# Patient Record
Sex: Female | Born: 1945 | Race: White | Hispanic: No | Marital: Married | Smoking: Former smoker
Health system: Southern US, Community
[De-identification: ages and names within clinical notes are randomized; demographics above are authoritative.]

## PROBLEM LIST (undated history)

## (undated) DIAGNOSIS — J029 Acute pharyngitis, unspecified: Secondary | ICD-10-CM

## (undated) DIAGNOSIS — Z87898 Personal history of other specified conditions: Secondary | ICD-10-CM

## (undated) DIAGNOSIS — Z8619 Personal history of other infectious and parasitic diseases: Secondary | ICD-10-CM

## (undated) DIAGNOSIS — I341 Nonrheumatic mitral (valve) prolapse: Secondary | ICD-10-CM

## (undated) DIAGNOSIS — E785 Hyperlipidemia, unspecified: Secondary | ICD-10-CM

## (undated) DIAGNOSIS — R55 Syncope and collapse: Secondary | ICD-10-CM

## (undated) DIAGNOSIS — K130 Diseases of lips: Secondary | ICD-10-CM

## (undated) DIAGNOSIS — R002 Palpitations: Secondary | ICD-10-CM

## (undated) DIAGNOSIS — M792 Neuralgia and neuritis, unspecified: Secondary | ICD-10-CM

## (undated) DIAGNOSIS — R5382 Chronic fatigue, unspecified: Secondary | ICD-10-CM

## (undated) DIAGNOSIS — Z8739 Personal history of other diseases of the musculoskeletal system and connective tissue: Secondary | ICD-10-CM

## (undated) DIAGNOSIS — F13930 Sedative, hypnotic or anxiolytic use, unspecified with withdrawal, uncomplicated: Secondary | ICD-10-CM

## (undated) DIAGNOSIS — F445 Conversion disorder with seizures or convulsions: Secondary | ICD-10-CM

## (undated) DIAGNOSIS — D6800 Von Willebrand disease, unspecified: Secondary | ICD-10-CM

## (undated) DIAGNOSIS — E559 Vitamin D deficiency, unspecified: Secondary | ICD-10-CM

## (undated) DIAGNOSIS — D509 Iron deficiency anemia, unspecified: Secondary | ICD-10-CM

## (undated) DIAGNOSIS — J309 Allergic rhinitis, unspecified: Secondary | ICD-10-CM

## (undated) DIAGNOSIS — M5412 Radiculopathy, cervical region: Secondary | ICD-10-CM

## (undated) DIAGNOSIS — G2581 Restless legs syndrome: Secondary | ICD-10-CM

## (undated) HISTORY — DX: Personal history of other specified conditions: Z87.898

## (undated) HISTORY — DX: Personal history of other diseases of the musculoskeletal system and connective tissue: Z87.39

## (undated) HISTORY — DX: Von Willebrand's disease: D68.0

## (undated) HISTORY — DX: Acute pharyngitis, unspecified: J02.9

## (undated) HISTORY — DX: Palpitations: R00.2

## (undated) HISTORY — DX: Chronic fatigue, unspecified: R53.82

## (undated) HISTORY — DX: Von Willebrand disease, unspecified: D68.00

## (undated) HISTORY — DX: Restless legs syndrome: G25.81

## (undated) HISTORY — DX: Sedative, hypnotic or anxiolytic use, unspecified with withdrawal, uncomplicated: F13.930

## (undated) HISTORY — DX: Vitamin D deficiency, unspecified: E55.9

## (undated) HISTORY — DX: Conversion disorder with seizures or convulsions: F44.5

## (undated) HISTORY — DX: Hyperlipidemia, unspecified: E78.5

## (undated) HISTORY — DX: Iron deficiency anemia, unspecified: D50.9

## (undated) HISTORY — DX: Diseases of lips: K13.0

## (undated) HISTORY — DX: Nonrheumatic mitral (valve) prolapse: I34.1

## (undated) HISTORY — DX: Syncope and collapse: R55

## (undated) HISTORY — DX: Personal history of other infectious and parasitic diseases: Z86.19

## (undated) HISTORY — PX: NO PAST SURGERIES: SHX2092

## (undated) HISTORY — DX: Allergic rhinitis, unspecified: J30.9

## (undated) HISTORY — DX: Neuralgia and neuritis, unspecified: M79.2

## (undated) HISTORY — DX: Radiculopathy, cervical region: M54.12
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
## 1997-08-18 ENCOUNTER — Other Ambulatory Visit: Admission: RE | Admit: 1997-08-18 | Discharge: 1997-08-18 | Payer: Self-pay | Admitting: Obstetrics and Gynecology

## 1998-01-19 ENCOUNTER — Other Ambulatory Visit: Admission: RE | Admit: 1998-01-19 | Discharge: 1998-01-19 | Payer: Self-pay | Admitting: Obstetrics and Gynecology

## 1998-02-26 ENCOUNTER — Other Ambulatory Visit: Admission: RE | Admit: 1998-02-26 | Discharge: 1998-02-26 | Payer: Self-pay | Admitting: Obstetrics and Gynecology

## 1998-04-14 ENCOUNTER — Other Ambulatory Visit: Admission: RE | Admit: 1998-04-14 | Discharge: 1998-04-14 | Payer: Self-pay | Admitting: Obstetrics and Gynecology

## 1998-08-19 ENCOUNTER — Other Ambulatory Visit: Admission: RE | Admit: 1998-08-19 | Discharge: 1998-08-19 | Payer: Self-pay | Admitting: Obstetrics and Gynecology

## 1999-02-08 ENCOUNTER — Other Ambulatory Visit: Admission: RE | Admit: 1999-02-08 | Discharge: 1999-02-08 | Payer: Self-pay | Admitting: Obstetrics and Gynecology

## 1999-08-19 ENCOUNTER — Other Ambulatory Visit: Admission: RE | Admit: 1999-08-19 | Discharge: 1999-08-19 | Payer: Self-pay | Admitting: Obstetrics and Gynecology

## 1999-09-29 ENCOUNTER — Encounter (INDEPENDENT_AMBULATORY_CARE_PROVIDER_SITE_OTHER): Payer: Self-pay

## 1999-09-29 ENCOUNTER — Other Ambulatory Visit: Admission: RE | Admit: 1999-09-29 | Discharge: 1999-09-29 | Payer: Self-pay | Admitting: Obstetrics and Gynecology

## 2000-11-07 ENCOUNTER — Other Ambulatory Visit: Admission: RE | Admit: 2000-11-07 | Discharge: 2000-11-07 | Payer: Self-pay | Admitting: Obstetrics and Gynecology

## 2001-11-07 ENCOUNTER — Other Ambulatory Visit: Admission: RE | Admit: 2001-11-07 | Discharge: 2001-11-07 | Payer: Self-pay | Admitting: Obstetrics and Gynecology

## 2002-12-13 ENCOUNTER — Other Ambulatory Visit: Admission: RE | Admit: 2002-12-13 | Discharge: 2002-12-13 | Payer: Self-pay | Admitting: Obstetrics and Gynecology

## 2004-02-26 ENCOUNTER — Ambulatory Visit: Payer: Self-pay | Admitting: Internal Medicine

## 2004-08-23 ENCOUNTER — Other Ambulatory Visit: Admission: RE | Admit: 2004-08-23 | Discharge: 2004-08-23 | Payer: Self-pay | Admitting: Obstetrics and Gynecology

## 2007-04-25 ENCOUNTER — Other Ambulatory Visit: Admission: RE | Admit: 2007-04-25 | Discharge: 2007-04-25 | Payer: Self-pay | Admitting: Family Medicine

## 2008-06-23 ENCOUNTER — Other Ambulatory Visit: Admission: RE | Admit: 2008-06-23 | Discharge: 2008-06-23 | Payer: Self-pay | Admitting: Family Medicine

## 2009-10-04 ENCOUNTER — Encounter: Admission: RE | Admit: 2009-10-04 | Discharge: 2009-10-04 | Payer: Self-pay | Admitting: Family Medicine

## 2010-05-02 ENCOUNTER — Encounter: Payer: Self-pay | Admitting: Family Medicine

## 2015-04-28 DIAGNOSIS — Z87898 Personal history of other specified conditions: Secondary | ICD-10-CM | POA: Insufficient documentation

## 2015-04-28 DIAGNOSIS — I341 Nonrheumatic mitral (valve) prolapse: Secondary | ICD-10-CM | POA: Insufficient documentation

## 2015-04-28 DIAGNOSIS — R002 Palpitations: Secondary | ICD-10-CM | POA: Insufficient documentation

## 2015-04-28 DIAGNOSIS — R55 Syncope and collapse: Secondary | ICD-10-CM | POA: Insufficient documentation

## 2015-05-07 ENCOUNTER — Ambulatory Visit: Payer: Self-pay | Admitting: Internal Medicine

## 2015-06-01 ENCOUNTER — Encounter: Payer: Self-pay | Admitting: Neurology

## 2015-06-01 ENCOUNTER — Ambulatory Visit: Payer: BLUE CROSS/BLUE SHIELD | Admitting: Neurology

## 2015-06-01 ENCOUNTER — Other Ambulatory Visit: Payer: Self-pay

## 2015-06-01 ENCOUNTER — Ambulatory Visit (INDEPENDENT_AMBULATORY_CARE_PROVIDER_SITE_OTHER): Payer: BLUE CROSS/BLUE SHIELD | Admitting: Neurology

## 2015-06-01 VITALS — BP 120/72 | HR 76 | Ht 65.0 in | Wt 121.4 lb

## 2015-06-01 DIAGNOSIS — R4189 Other symptoms and signs involving cognitive functions and awareness: Secondary | ICD-10-CM

## 2015-06-01 DIAGNOSIS — G4700 Insomnia, unspecified: Secondary | ICD-10-CM | POA: Insufficient documentation

## 2015-06-01 DIAGNOSIS — M797 Fibromyalgia: Secondary | ICD-10-CM

## 2015-06-01 DIAGNOSIS — R404 Transient alteration of awareness: Secondary | ICD-10-CM

## 2015-06-01 DIAGNOSIS — R419 Unspecified symptoms and signs involving cognitive functions and awareness: Secondary | ICD-10-CM

## 2015-06-01 DIAGNOSIS — R55 Syncope and collapse: Secondary | ICD-10-CM | POA: Diagnosis not present

## 2015-06-01 HISTORY — DX: Insomnia, unspecified: G47.00

## 2015-06-01 HISTORY — DX: Fibromyalgia: M79.7

## 2015-06-01 NOTE — Patient Instructions (Signed)
As far as diagnostic testing: MRI brain, eeg, lab  I would like to see you back in 3 months, sooner if we need to. Please call us with any interim questions, concerns, problems, updates or refill requests.   Our phone number is [phone]. We also have an after hours call service for urgent matters and there is a physician on-call for urgent questions. For any emergencies you know to call 911 or go to the nearest emergency room

## 2015-06-01 NOTE — Progress Notes (Signed)
Dr.Ahern did order a BMP for lab work. Dr. Lucia Gaskins told nurse pt does not need labs. Lab was notified and patient. Order for labs will be cancel per Dr.Ahern request.

## 2015-06-01 NOTE — Progress Notes (Signed)
[facility]    Provider:  Dr Lucia Gaskins Referring Provider: Shirlean Mylar, MD Primary Care Physician:  Frederich Chick, MD  CC:  Syncopal episodes  HPI:  Dawn Simmons is a 70 y.o. female here as a referral from Dr. Hyman Hopes for syncopal episodes. PMHx Fibromyalgia, RLS, Insomnia, low back pain, Iron deficiency. She was evaluated by cardiology with no etiology found. Patient is here with husband and daughter. June 30th she had her first episode. She got dizzy, she laid on the floor and then went and threw up. She was sick in November and she was given a z-pack and she took that and then started having everal loss of consciousness. She would pass out in the bed, for seconds. She has had up to 5 or more over a 3-day period. The last time she was sitting at the kitchen table, the first week of November she found herself on the floor with mail in her hands. Dr. Hyman Hopes thought they were panic attacks, she was given valium. She got better on the valium. No more episodes un til January. The first week of January she was sick again, upper respiratory infection and she had She had multiple in January, she would have episodes of where she was dream like, colors and cartoons and never went out completely. 10 in January. Then in February 4th had 5 of them, 8th had 4, 9th 5, 7th last time. Mostly she feel like she is going to gray out, she isn't dizzy but she has a feeling of lightheadedness, things gray out, she doesn't feel right, she tries to lay down, no triggers but there is a lot of stress, not diaphoretic, she either passes out completely or has a dream state. She knew where she was, she knew what was happening but couldn't say anything. She has fallen and hit her head. When she comes to, she is drowsy for 30 seconds and then thinks normal. She is out for 15 seconds. Daughter times with a clock and it was 35 seconds. She is just limp. No abnormal movements. She has said "well I am back" when she comes out  of it. Eyes closed. Sometimes she is partially aware during her LOS and she has visual hallucinations, saw colorful circles like clowns and colors once. Not positional, no time of day, layting down, sitting, standing anytime. She was on Holter for 2 weeks which did not show anything even during an episode. No FHx seizures or personal history.No excessive sleepiness. She is tired a lot because she doesn't sleep well. No hypnagoic or hypnapompic episodes. They do not happen during times of emotion.   Reviewed notes, labs and imaging from outside physicians, which showed:  CMP was unremarkable on 04/20/2015 with a creatinine of 0.78. TSH 1.45.  Review of Systems: Patient complains of symptoms per HPI as well as the following symptoms: weight loss, fatigue, palpitations, feeling cold, ringing in ears, joint pain, cramps, aching muscles, itching, moles, runny nose, skin sensitivity, weakness, passing out, insomnia, sleepiness, anxiety, not enough sleep, decreased energy, racing thoughts. . Pertinent negatives per HPI. All others negative.   Social History   Social History  . Marital Status: Married    Spouse Name: tony  . Number of Children: 4  . Years of Education: 12   Occupational History  . Not on file.   Social History Main Topics  . Smoking status: Former Smoker    Quit date: 04/27/1972  . Smokeless tobacco: Not on file  . Alcohol  Use: 0.0 oz/week    0 Standard drinks or equivalent per week     Comment: 1 glass wine daily  . Drug Use: No  . Sexual Activity: Not on file   Other Topics Concern  . Not on file   Social History Narrative   Lives at home with husband   Caffeine use- tea, 1 cup/day    Family History  Problem Relation Age of Onset  . Heart attack Maternal Grandfather   . Heart attack Paternal Grandfather   . Heart attack Maternal Uncle   . Stroke Mother   . Emphysema Father   . Heart failure Maternal Grandmother   . Heart failure Paternal Grandmother   .  Seizures Neg Hx     Past Medical History  Diagnosis Date  . H/O fibromyalgia   . H/O insomnia   . Syncope   . Palpitations   . Mitral valve prolapse   . History of palpitations   . Von Willebrand disease [facility])     Past Surgical History  Procedure Laterality Date  . No past surgeries      Current Outpatient Prescriptions  Medication Sig Dispense Refill  . diazepam (VALIUM) 5 MG tablet TAKE 1/2 TO 1 TABLET AT BED TIME    . Fish Oil OIL Take 1,000 mg by mouth daily.    . Homeopathic Products (ARNICA) GEL Apply topically.    . Multiple Vitamin (MULTIVITAMIN) capsule Take 1 capsule by mouth daily.    . Probiotic Product (PROBIOTIC FORMULA PO) Take by mouth as directed.    Marland Kitchen VITAMIN K PO Take 100 mcg by mouth daily.     No current facility-administered medications for this visit.    Allergies as of 06/01/2015 - Review Complete 06/01/2015  Allergen Reaction Noted  . Doxycycline Swelling 04/28/2015  . Tetanus toxoids Other (See Comments) 04/28/2015  . Flexeril [cyclobenzaprine] Other (See Comments) 04/28/2015    Vitals: BP 120/72 mmHg  Pulse 76  Ht 5\' 5"  (1.651 m)  Wt 121 lb 6.4 oz (55.067 kg)  BMI 20.20 kg/m2 Last Weight:  Wt Readings from Last 1 Encounters:  06/01/15 121 lb 6.4 oz (55.067 kg)   Last Height:   Ht Readings from Last 1 Encounters:  06/01/15 5\' 5"  (1.651 m)   Physical exam: Exam: Gen: NAD, conversant, well nourised, well groomed                     CV: RRR, no MRG. No Carotid Bruits. No peripheral edema, warm, nontender Eyes: Conjunctivae clear without exudates or hemorrhage  Neuro: Detailed Neurologic Exam  Speech:    Speech is normal; fluent and spontaneous with normal comprehension.  Cognition:    The patient is oriented to person, place, and time;     recent and remote memory intact;     language fluent;     normal attention, concentration,     fund of knowledge Cranial Nerves:    The pupils are equal, round, and reactive to light. The  fundi are normal and spontaneous venous pulsations are present. Visual fields are full to finger confrontation. Extraocular movements are intact. Trigeminal sensation is intact and the muscles of mastication are normal. The face is symmetric. The palate elevates in the midline. Hearing intact. Voice is normal. Shoulder shrug is normal. The tongue has normal motion without fasciculations.   Coordination:    Normal finger to nose and heel to shin. Normal rapid alternating movements.   Gait:    Heel-toe  and tandem gait are normal.   Motor Observation:    No asymmetry, no atrophy, and no involuntary movements noted. Tone:    Normal muscle tone.    Posture:    Posture is normal. normal erect    Strength:    Strength is V/V in the upper and lower limbs.      Sensation: intact to LT     Reflex Exam:  DTR's:    Deep tendon reflexes in the upper and lower extremities are brisk bilaterally.   Toes:    The toes are downgoing bilaterally.   Clonus:    Clonus is absent.    Assessment/Plan:  This is a 70 year old female with multiple episodes of alteration of consciousness. Patient is sometimes aware during these episodes and is unable to answer, is not consistent with seizures however need a workup including MRI of the brain with and without contrast, EEG. Routine EEG is negative will order a 3 day ambulatory video EEG to try and catch some of these episodes. If patient's narcolepsy can experience REM intrusions while awake and patient does describe some hallucinations during these episodes however she has no other symptoms of narcolepsy and this is unlikely. It may be nonepileptic events or pseudoseizures.  Naomie Dean, MD  [facility] [address] [place], [place] [address]  Phone [phone] Fax [phone]

## 2015-06-02 ENCOUNTER — Telehealth: Payer: Self-pay | Admitting: Neurology

## 2015-06-02 NOTE — Telephone Encounter (Signed)
Pt was looking at her after visit summary and says she does not agree with the diagnosis of Acute Confusion. She says that if Dr. Lucia Gaskins feels that it is accurate to please call the pt she would like to discuss why. Pt feels "acute confusion" is not good description of how she feels when she has an episode. She is a little concerned about the wording and what it might mean. Pt needs reassurance. Please call and advise [phone]

## 2015-06-02 NOTE — Telephone Encounter (Signed)
Spoke to patient. Will change diagnosis.

## 2015-06-02 NOTE — Telephone Encounter (Signed)
Called pt back. Clarified what acute confusion meant. She states she does not experience confusion and is confused as to why this was used. I explained what acute/chronic meant. She thought acute confusion was something serious. She states she is aware of what is going on during episodes except for when she blacks out. I stated that when she blacks out, someone has to explain what happens when she wakes up. She understands but states she is still aware of what is going on. She thanked me for explaining what this means but wants to know Dr Trevor Mace thoughts on why this was put. I explained I was not in the office yesterday and I will speak to Dr Lucia Gaskins and call her back to clarify. She verbalized understanding.

## 2015-06-02 NOTE — Addendum Note (Signed)
Addended by: Hillis Range on: 06/02/2015 10:44 AM   Modules accepted: Medications

## 2015-06-03 ENCOUNTER — Other Ambulatory Visit: Payer: Self-pay | Admitting: *Deleted

## 2015-06-14 ENCOUNTER — Ambulatory Visit
Admission: RE | Admit: 2015-06-14 | Discharge: 2015-06-14 | Disposition: A | Discharge status: Home / Self Care | Payer: Self-pay | Source: Ambulatory Visit | Attending: Neurology | Admitting: Neurology

## 2015-06-14 DIAGNOSIS — R419 Unspecified symptoms and signs involving cognitive functions and awareness: Secondary | ICD-10-CM | POA: Diagnosis not present

## 2015-06-14 DIAGNOSIS — R55 Syncope and collapse: Secondary | ICD-10-CM | POA: Diagnosis not present

## 2015-06-14 MED ORDER — GADOBENATE DIMEGLUMINE 529 MG/ML IV SOLN
10.0000 mL | Freq: Once | INTRAVENOUS | Status: AC | PRN
  Administered 2015-06-14: 10 mL via INTRAVENOUS

## 2015-06-16 ENCOUNTER — Telehealth: Payer: Self-pay | Admitting: *Deleted

## 2015-06-16 NOTE — Telephone Encounter (Signed)
Called and spoke to pt about normal MRI brain for age per Dr Lucia GaskinsAhern. Verified EEG appt on 3/13. Explained difference between MRI and EEG.

## 2015-06-16 NOTE — Telephone Encounter (Signed)
-----   Message from Anson FretAntonia B Ahern, MD sent at 06/15/2015  5:11 PM EST ----- MRi of the brain normal for age

## 2015-06-22 ENCOUNTER — Ambulatory Visit (INDEPENDENT_AMBULATORY_CARE_PROVIDER_SITE_OTHER): Payer: BLUE CROSS/BLUE SHIELD | Admitting: Neurology

## 2015-06-22 DIAGNOSIS — R55 Syncope and collapse: Secondary | ICD-10-CM

## 2015-06-22 DIAGNOSIS — R419 Unspecified symptoms and signs involving cognitive functions and awareness: Secondary | ICD-10-CM

## 2015-06-24 ENCOUNTER — Telehealth: Payer: Self-pay | Admitting: Neurology

## 2015-06-24 NOTE — Telephone Encounter (Signed)
EEG did not show any epileptiform activity. Spoke to patient. Offered a 3-day eeg so we could monitor her over an extended period. Patient declined. She will call us if she has anymore episodes.

## 2015-06-24 NOTE — Telephone Encounter (Signed)
Called pt back. Advised EEG results not ready yet. I will forward to Dr Lucia GaskinsAhern and we will call her once results are ready. She verbalized understanding.

## 2015-06-24 NOTE — Telephone Encounter (Signed)
Pt called requesting EEG results. Pt is anxious, she was told on Monday she would hear regarding EEG on Tuesday or Wednesday. She is wanting to be called if it will be longer than today to know when she will be notified.

## 2015-06-24 NOTE — Procedures (Signed)
   HISTORY: 70 year old female with passing out episode of unknown etiology  TECHNIQUE:  16 channel EEG was performed based on standard 10-16 international system. There was no cardiac rhythm recorded.  Upon awakening, the posterior background activity was mildly dysarrhythmic, with mixed alpha and theta range activities, reactive to eye opening and closure.  There was no evidence of epileptiform discharge.  Photic stimulation was performed, which induced a symmetric photic driving.  Hyperventilation was performed, there was no abnormality elicit.  No sleep was achieved.  CONCLUSION: This is a slight abnormal EEG.  There is mild background slowing indicate mild bi-cerebral malfunction, common etiology is metabolic toxic disarrangement.

## 2015-07-06 ENCOUNTER — Encounter: Payer: Self-pay | Admitting: Internal Medicine

## 2015-07-06 ENCOUNTER — Ambulatory Visit (INDEPENDENT_AMBULATORY_CARE_PROVIDER_SITE_OTHER): Payer: BLUE CROSS/BLUE SHIELD | Admitting: Internal Medicine

## 2015-07-06 VITALS — BP 120/78 | HR 74 | Ht 65.0 in | Wt 120.6 lb

## 2015-07-06 DIAGNOSIS — R55 Syncope and collapse: Secondary | ICD-10-CM

## 2015-07-06 NOTE — Progress Notes (Signed)
ELECTROPHYSIOLOGY CONSULT NOTE  Patient ID: Dawn Simmons, MRN: [id number], DOB/AGE: [birth year] 69 y.o. Admit date: (Not on file) Date of Consult: 07/06/2015  Primary Physician: Frederich Chick, MD Primary Cardiologist: jg Consulting Physician JG Chief Complaint: syncope    HPI Dawn Simmons is a 70 y.o. female  Seen for spells.  The first spell apparently occurred June 2016. She walked out of the bathroom and felt dizzy and became presyncopal. She laid on the floor. She was nauseated vomiting and then felt weak.  Some months later, 11/16 she developed an illness characterized by "laryngitis". She felt sick. She could not clarify this but she was sick enough that she was in bed for days. She was achy "feeling crummy" may be that her myalgias. She was given a Z-Pak. She passed out a number of times she said in bed. She is sure that she did not fall asleep. These were happening clusters. On one occasion she got up and was sitting in a chair opening mail and she awakened on the floor.  She then felt better for a couple of weeks with recurrent symptoms about 2 months later she notes that the laryngitis symptoms never resolved. But she again felt ill as if she had the flu but "not really" she again took a Z-Pak. There again recurrent episodes of syncope.  She describes a syncopal episodes as occurring in any position. A last 20-30 seconds. Sometimes she can hear and sometimes she cannot. Sometimes she is able to sit down and abort the episodes.  At some point her physician felt she is having panic attacks and treated with benzodiazepines without significant improvement.  She then had a third cluster of episodes this time unassociated with azithromycin.  She was finally referred to Dawn Simmons. She has a high regard for him.  As part of his evaluation, she recalls that he did carotid sinus massage with some recurrence of symptoms.  Cardiac evaluation has included an echocardiogram 2/17 that  was normal. A treadmill test 2/17 was also normal. Carotid Dopplers were less than 50%. An event recorder demonstrated sinus rhythm during episode of syncope albeit with a rapid rate as above 121 bpm.  Other episodes were associated with sinus rates in the 70s. A video taken by her daughter while she is sitting upright demonstrate that her lids are closed and there is no change in skin color.  In this regard and deeply embedded to the careful note by Dr. Daisy Simmons. She describes a series of multiple episodes occurring in clusters. She is described as "just limp" her eyes are described as closed. "Sometimes she is partially aware during her LOS and she has visual hallucinations.     Past Medical History  Diagnosis Date  . H/O fibromyalgia   . H/O insomnia   . Syncope   . Palpitations   . Mitral valve prolapse   . History of palpitations   . Von Willebrand disease [facility])       Surgical History:  Past Surgical History  Procedure Laterality Date  . No past surgeries       Home Meds: Prior to Admission medications   Medication Sig Start Date End Date Taking? Authorizing Provider  Calcium-Magnesium 200-100 MG TABS Take 4 tablets by mouth daily. 2 tablets in the morning and 2 tablets in the evening   Yes Historical Provider, MD  co-enzyme Q-10 50 MG capsule Take 50 mg by mouth daily.   Yes Historical Provider, MD  diazepam (  VALIUM) 5 MG tablet Take 5 mg by mouth every 6 (six) hours as needed for anxiety or muscle spasms. TAKE 1/2 TO 1 TABLET AT BED TIME as needed   Yes Historical Provider, MD  Fish Oil OIL Take 2,000 mg by mouth daily.    Yes Historical Provider, MD  Homeopathic Products (ARNICA) GEL Apply topically as needed (as needed to affected areas).    Yes Historical Provider, MD  Multiple Vitamin (MULTIVITAMIN) capsule Take 1 capsule by mouth daily.   Yes Historical Provider, MD  Probiotic Product (PROBIOTIC FORMULA PO) Take 1 tablet by mouth daily.    Yes Historical Provider, MD    vitamin B-12 (CYANOCOBALAMIN) 1000 MCG tablet Take 1,000 mcg by mouth daily.   Yes Historical Provider, MD  VITAMIN K PO Take 90 mcg by mouth daily.    Yes Historical Provider, MD    Allergies:  Allergies  Allergen Reactions  . Doxycycline Swelling  . Tetanus Toxoids Other (See Comments)    PAIN IN NECK AND FACE  . Flexeril [Cyclobenzaprine] Other (See Comments)    Social History   Social History  . Marital Status: Married    Spouse Name: Dawn Simmons  . Number of Children: 4  . Years of Education: 12   Occupational History  . Not on file.   Social History Main Topics  . Smoking status: Former Smoker    Quit date: 04/27/1972  . Smokeless tobacco: Not on file  . Alcohol Use: 0.0 oz/week    0 Standard drinks or equivalent per week     Comment: 1 glass wine daily  . Drug Use: No  . Sexual Activity: Not on file   Other Topics Concern  . Not on file   Social History Narrative   Lives at home with husband   Caffeine use- tea, 1 cup/day     Family History  Problem Relation Age of Onset  . Heart attack Maternal Grandfather   . Heart attack Paternal Grandfather   . Heart attack Maternal Uncle   . Stroke Mother   . Emphysema Father   . Heart failure Maternal Grandmother   . Heart failure Paternal Grandmother   . Seizures Neg Hx      ROS:  Please see the history of present illness.     All other systems reviewed and negative.    Physical Exam:   Blood pressure 120/78, pulse 74, height 5\' 5"  (1.651 m), weight 120 lb 9.6 oz (54.704 kg). General: Well developed, well nourished female in no acute distress. Head: Normocephalic, atraumatic, sclera non-icteric, no xanthomas, nares are without discharge. EENT: normal  Lymph Nodes:  none Neck: Negative for carotid bruits. JVD not elevated. Back:without scoliosis kyphosis  . Msk:  Strength and tone appear normal for age. Extremities: No clubbing or cyanosis. No edema.  Distal pedal pulses are 2+ and equal bilaterally. Skin:  Warm and Dry Neuro: Alert and oriented X 3. CN III-XII intact Grossly normal sensory and motor function . Psych:  Responds to questions appropriately with a normal affect.      Labs: Cardiac Enzymes No results for input(s): CKTOTAL, CKMB, TROPONINI in the last 72 hours. CBC No results found for: WBC, HGB, HCT, MCV, PLT PROTIME: No results for input(s): LABPROT, INR in the last 72 hours. Chemistry No results for input(s): NA, K, CL, CO2, BUN, CREATININE, CALCIUM, PROT, BILITOT, ALKPHOS, ALT, AST, GLUCOSE in the last 168 hours.  Invalid input(s): LABALBU Lipids No results found for: CHOL, HDL, LDLCALC, TRIG  BNP No results found for: PROBNP Thyroid Function Tests: No results for input(s): TSH, T4TOTAL, T3FREE, THYROIDAB in the last 72 hours.  Invalid input(s): FREET3 Miscellaneous No results found for: DDIMER  Radiology/Studies:  Mr Lodema Pilot Contrast  06/15/2015   [facility] [address] [place], [place] [address] [phone] NEUROIMAGING REPORT STUDY DATE: 06/14/2015 PATIENT NAME: Emanuela Runnion DOB: 04/29/1945 MRN: [id number] EXAM: MRI Brain with and without contrast ORDERING CLINICIAN: Naomie Dean M.D. CLINICAL HISTORY: 70 year old woman with transient alteration of awareness and loss of consciousness. COMPARISON FILMS: None TECHNIQUE: MRI of the brain with and without contrast was obtained utilizing 5 mm axial slices with T1, T2, T2 flair, T2 star gradient echo and diffusion weighted views.  T1 sagittal, T2 coronal and postcontrast views in the axial and coronal plane were obtained. CONTRAST: 10 ml Multihance IMAGING SITE: [facility], [address] FINDINGS: On sagittal images, the spinal cord is imaged caudally to C3 and is normal in caliber.   The contents of the posterior fossa are of normal size and position.   The pituitary gland and optic chiasm appear normal.    There is minimal article atrophy, typical for age.   The ventricles are  normal in size for age and without distortion.  There are no abnormal extra-axial collections of fluid.  The cerebellum and brainstem appears normal.   The deep gray matter appears normal.  There are a few small scattered punctate T2/FLAIR hyperintense foci in the deep white matter of both hemispheres.  The orbits appear normal.   The VIIth/VIIIth nerve complex appears normal.  The mastoid air cells appear normal.  The paranasal sinuses appear normal.  Flow voids are identified within the major intracerebral arteries.   Diffusion weighted images are normal.  Gradient echo heme weighted images are normal.   After the infusion of contrast material, a normal enhancement pattern is noted.   06/15/2015   This MRI of the brain with and without contrast shows the following: 1.   A few scattered T2/FLAIR hyperintense foci in the hemispheres is consistent with a normal extent of age-related chronic microvascular ischemic change. 2.   Brain volume is normal for age. 3.   There are no acute findings. INTERPRETING PHYSICIAN: Richard A. Epimenio Foot, MD, PhD Certified in  Neuroimaging by American Society of Neuroimaging    EKG:  From Dr. Vernona Rieger office date not clear sinus rhythm at 81 Intervals 18/08/36   Assessment and Plan: * Spells  Laryngitis   The patient has unusual spells. Features of them suggest neuropsychiatric spells and these include the fact that her eyes are closed, the hallucinations that are described, the lack of pallor noted on her video recording, and the fact that they happen while she is supine and they happen in clusters. That having been said, uncommon manifestations of common things are more common but it's hard for me to understand how these could be cardiovascular in origin.  I am intrigued by the response to carotid sinus massage and some similarities to her symptoms. The monitor clearly excludes a cardioinhibitory component or a primary arrhythmic component. However, a basal depression component  is still possible. Given her complaints about her voice, I think it is important to exclude a structural abnormality of her neck where in both carotid sinus hypersensitivity and "disoriented" may be cooccuring. I do think is reasonable for ENT evaluation both for direct laryngoscopy as well as possibly imaging to exclude such a process.  I  have offered tilt table testing which if we were to do undertake with arterial line. For the reasons outlined above I'm not sanguine that it would be informative  The initial concern by the cooccuring recurrence with the Zithromax of QT prolongation and polymorphic ventricular tachycardia is excluded by the rhythm detections.    Sherryl MangesSteven Purvis Sidle

## 2015-07-07 ENCOUNTER — Ambulatory Visit: Payer: BLUE CROSS/BLUE SHIELD | Admitting: Neurology

## 2015-07-16 NOTE — Telephone Encounter (Addendum)
Patient called to request EEG results and MRI results and explanation as to what they found and what they didn't find to be mailed to her home address. Patient states she told Dr. Graciela HusbandsKlein that they didn't find anything on her MRI, states Dr. Graciela HusbandsKlein said "not exactly".

## 2015-07-16 NOTE — Telephone Encounter (Signed)
Dr Ahern- please advise, thank you 

## 2015-07-16 NOTE — Telephone Encounter (Signed)
I spoke to patient. She doesn't remember calling me today. I reviewed everything with her again today.  The MRI of the brain was normal for age. It showed white matter changes that were non-specific and within normal limits for aging. The EEG did not show any epileptiform activity. It showed slowing but this is nonspecific and can be caused by illness, medication effects, dementing process or other metabolic or toxic causes. She has been thoroughly evaluated by primary care and she reports nothing found.  I had offered patient a 3-day eeg aand she declined. I encouraged her again on the phone today to let us perform a 3-day home eeg and she has declined. She has declined a sleep evaluation as well. The daughter took a video of the most recent event, she had another event where she passed out for 25 seconds, she was not confused when she she came out of the event. She knows when she is going to have the event. I am going to have her email me the video so I can look at it. I encouraged her to come to the office and meet with me to discuss at any time or call us if needed.

## 2015-08-03 ENCOUNTER — Telehealth: Payer: Self-pay | Admitting: Neurology

## 2015-08-03 NOTE — Telephone Encounter (Signed)
Patient is calling in regard to the video she sent to Dr. Lucia GaskinsAhern a couple of weeks ago of her passing out and would also like to speak with her about the EEG.  Please call [phone]. Thanks!

## 2015-08-03 NOTE — Telephone Encounter (Addendum)
Patient is calling and states she sent a video to Dr. Lucia GaskinsAhern a couple of weeks ago of her passing out.  She would like to speak to her about the video and her EEG.  Please call. THIS MESSAGE ATTACHED TO PRIOR MESSAGE(SEE 06/24/15) AND FORWARDED TO DR. Anne HahnWILLIS AS [facility] ALL WEEK OF 08/03/15. PATIENT IS AWARE ANOTHER DR WILL BE CALLING HER.

## 2015-08-03 NOTE — Telephone Encounter (Signed)
I called patient. The patient had a few questions about EEG, apparently the electrodes had be changed out several times, wanted to know if this may have something to do with the way that the EEG was read. The patient indicated that her events may come in clusters. Potentially getting a prolonged EEG study after the initiation of a cluster of seizures may be the way to go. Potentially using medications as an impaired trial may offer some diagnostic benefit as well.

## 2015-08-17 ENCOUNTER — Ambulatory Visit: Payer: BLUE CROSS/BLUE SHIELD | Admitting: Neurology

## 2016-08-15 DIAGNOSIS — F411 Generalized anxiety disorder: Secondary | ICD-10-CM | POA: Diagnosis not present

## 2016-09-12 DIAGNOSIS — F411 Generalized anxiety disorder: Secondary | ICD-10-CM | POA: Diagnosis not present

## 2016-10-20 DIAGNOSIS — J029 Acute pharyngitis, unspecified: Secondary | ICD-10-CM | POA: Diagnosis not present

## 2016-10-20 DIAGNOSIS — J988 Other specified respiratory disorders: Secondary | ICD-10-CM | POA: Diagnosis not present

## 2016-10-20 DIAGNOSIS — R05 Cough: Secondary | ICD-10-CM | POA: Diagnosis not present

## 2016-11-07 DIAGNOSIS — F411 Generalized anxiety disorder: Secondary | ICD-10-CM | POA: Diagnosis not present

## 2016-11-07 DIAGNOSIS — M797 Fibromyalgia: Secondary | ICD-10-CM | POA: Diagnosis not present

## 2016-12-13 ENCOUNTER — Ambulatory Visit (INDEPENDENT_AMBULATORY_CARE_PROVIDER_SITE_OTHER): Payer: Medicare Other | Admitting: Psychology

## 2016-12-13 DIAGNOSIS — F444 Conversion disorder with motor symptom or deficit: Secondary | ICD-10-CM | POA: Diagnosis not present

## 2016-12-19 DIAGNOSIS — Z23 Encounter for immunization: Secondary | ICD-10-CM | POA: Diagnosis not present

## 2016-12-23 ENCOUNTER — Ambulatory Visit: Payer: Medicare Other | Admitting: Psychology

## 2016-12-27 DIAGNOSIS — F411 Generalized anxiety disorder: Secondary | ICD-10-CM | POA: Diagnosis not present

## 2016-12-27 DIAGNOSIS — F449 Dissociative and conversion disorder, unspecified: Secondary | ICD-10-CM | POA: Diagnosis not present

## 2016-12-29 ENCOUNTER — Ambulatory Visit (INDEPENDENT_AMBULATORY_CARE_PROVIDER_SITE_OTHER): Payer: Medicare Other | Admitting: Psychology

## 2016-12-29 DIAGNOSIS — F449 Dissociative and conversion disorder, unspecified: Secondary | ICD-10-CM

## 2017-01-11 ENCOUNTER — Ambulatory Visit (INDEPENDENT_AMBULATORY_CARE_PROVIDER_SITE_OTHER): Payer: Medicare Other | Admitting: Psychology

## 2017-01-11 DIAGNOSIS — F444 Conversion disorder with motor symptom or deficit: Secondary | ICD-10-CM

## 2017-01-16 DIAGNOSIS — D509 Iron deficiency anemia, unspecified: Secondary | ICD-10-CM | POA: Diagnosis not present

## 2017-01-16 DIAGNOSIS — E559 Vitamin D deficiency, unspecified: Secondary | ICD-10-CM | POA: Diagnosis not present

## 2017-01-16 DIAGNOSIS — F449 Dissociative and conversion disorder, unspecified: Secondary | ICD-10-CM | POA: Diagnosis not present

## 2017-01-16 DIAGNOSIS — M792 Neuralgia and neuritis, unspecified: Secondary | ICD-10-CM | POA: Diagnosis not present

## 2017-01-23 DIAGNOSIS — K130 Diseases of lips: Secondary | ICD-10-CM | POA: Diagnosis not present

## 2017-01-23 DIAGNOSIS — L245 Irritant contact dermatitis due to other chemical products: Secondary | ICD-10-CM | POA: Diagnosis not present

## 2017-01-23 DIAGNOSIS — L280 Lichen simplex chronicus: Secondary | ICD-10-CM | POA: Diagnosis not present

## 2017-02-13 ENCOUNTER — Ambulatory Visit (INDEPENDENT_AMBULATORY_CARE_PROVIDER_SITE_OTHER): Payer: Medicare Other | Admitting: Psychology

## 2017-02-13 DIAGNOSIS — F444 Conversion disorder with motor symptom or deficit: Secondary | ICD-10-CM | POA: Diagnosis not present

## 2017-02-21 DIAGNOSIS — F411 Generalized anxiety disorder: Secondary | ICD-10-CM | POA: Diagnosis not present

## 2017-02-22 ENCOUNTER — Ambulatory Visit (INDEPENDENT_AMBULATORY_CARE_PROVIDER_SITE_OTHER): Payer: Medicare Other | Admitting: Psychology

## 2017-02-22 DIAGNOSIS — F418 Other specified anxiety disorders: Secondary | ICD-10-CM

## 2017-03-08 ENCOUNTER — Ambulatory Visit (INDEPENDENT_AMBULATORY_CARE_PROVIDER_SITE_OTHER): Payer: Medicare Other | Admitting: Psychology

## 2017-03-08 DIAGNOSIS — F418 Other specified anxiety disorders: Secondary | ICD-10-CM

## 2017-03-22 ENCOUNTER — Ambulatory Visit (INDEPENDENT_AMBULATORY_CARE_PROVIDER_SITE_OTHER): Payer: Medicare Other | Admitting: Psychology

## 2017-03-22 DIAGNOSIS — F4323 Adjustment disorder with mixed anxiety and depressed mood: Secondary | ICD-10-CM

## 2017-04-18 DIAGNOSIS — F411 Generalized anxiety disorder: Secondary | ICD-10-CM | POA: Diagnosis not present

## 2017-04-19 ENCOUNTER — Ambulatory Visit (INDEPENDENT_AMBULATORY_CARE_PROVIDER_SITE_OTHER): Payer: Medicare Other | Admitting: Psychology

## 2017-04-19 DIAGNOSIS — F4323 Adjustment disorder with mixed anxiety and depressed mood: Secondary | ICD-10-CM | POA: Diagnosis not present

## 2017-04-25 DIAGNOSIS — E559 Vitamin D deficiency, unspecified: Secondary | ICD-10-CM | POA: Diagnosis not present

## 2017-04-25 DIAGNOSIS — Z1211 Encounter for screening for malignant neoplasm of colon: Secondary | ICD-10-CM | POA: Diagnosis not present

## 2017-04-25 DIAGNOSIS — F439 Reaction to severe stress, unspecified: Secondary | ICD-10-CM | POA: Diagnosis not present

## 2017-04-25 DIAGNOSIS — K130 Diseases of lips: Secondary | ICD-10-CM | POA: Diagnosis not present

## 2017-04-25 DIAGNOSIS — F449 Dissociative and conversion disorder, unspecified: Secondary | ICD-10-CM | POA: Diagnosis not present

## 2017-04-25 DIAGNOSIS — E782 Mixed hyperlipidemia: Secondary | ICD-10-CM | POA: Diagnosis not present

## 2017-04-25 DIAGNOSIS — Z0000 Encounter for general adult medical examination without abnormal findings: Secondary | ICD-10-CM | POA: Diagnosis not present

## 2017-04-25 DIAGNOSIS — D509 Iron deficiency anemia, unspecified: Secondary | ICD-10-CM | POA: Diagnosis not present

## 2017-04-26 ENCOUNTER — Ambulatory Visit (INDEPENDENT_AMBULATORY_CARE_PROVIDER_SITE_OTHER): Payer: Medicare Other | Admitting: Psychology

## 2017-04-26 DIAGNOSIS — F4323 Adjustment disorder with mixed anxiety and depressed mood: Secondary | ICD-10-CM

## 2017-05-03 ENCOUNTER — Ambulatory Visit (INDEPENDENT_AMBULATORY_CARE_PROVIDER_SITE_OTHER): Payer: Medicare Other | Admitting: Psychology

## 2017-05-03 DIAGNOSIS — F411 Generalized anxiety disorder: Secondary | ICD-10-CM | POA: Diagnosis not present

## 2017-05-04 DIAGNOSIS — H25813 Combined forms of age-related cataract, bilateral: Secondary | ICD-10-CM | POA: Diagnosis not present

## 2017-05-10 ENCOUNTER — Ambulatory Visit (INDEPENDENT_AMBULATORY_CARE_PROVIDER_SITE_OTHER): Payer: Medicare Other | Admitting: Psychology

## 2017-05-10 DIAGNOSIS — F411 Generalized anxiety disorder: Secondary | ICD-10-CM

## 2017-05-10 DIAGNOSIS — F444 Conversion disorder with motor symptom or deficit: Secondary | ICD-10-CM

## 2017-05-17 ENCOUNTER — Ambulatory Visit (INDEPENDENT_AMBULATORY_CARE_PROVIDER_SITE_OTHER): Payer: Medicare Other | Admitting: Psychology

## 2017-05-17 DIAGNOSIS — F411 Generalized anxiety disorder: Secondary | ICD-10-CM | POA: Diagnosis not present

## 2017-06-13 DIAGNOSIS — F411 Generalized anxiety disorder: Secondary | ICD-10-CM | POA: Diagnosis not present

## 2017-06-13 DIAGNOSIS — F449 Dissociative and conversion disorder, unspecified: Secondary | ICD-10-CM | POA: Diagnosis not present

## 2017-06-22 ENCOUNTER — Ambulatory Visit (INDEPENDENT_AMBULATORY_CARE_PROVIDER_SITE_OTHER): Payer: Medicare Other | Admitting: Psychology

## 2017-06-22 DIAGNOSIS — F444 Conversion disorder with motor symptom or deficit: Secondary | ICD-10-CM

## 2017-06-28 ENCOUNTER — Ambulatory Visit (INDEPENDENT_AMBULATORY_CARE_PROVIDER_SITE_OTHER): Payer: Medicare Other | Admitting: Psychology

## 2017-06-28 DIAGNOSIS — F411 Generalized anxiety disorder: Secondary | ICD-10-CM

## 2017-07-03 ENCOUNTER — Ambulatory Visit (INDEPENDENT_AMBULATORY_CARE_PROVIDER_SITE_OTHER): Payer: Medicare Other | Admitting: Psychology

## 2017-07-03 DIAGNOSIS — F411 Generalized anxiety disorder: Secondary | ICD-10-CM | POA: Diagnosis not present

## 2017-07-12 ENCOUNTER — Ambulatory Visit (INDEPENDENT_AMBULATORY_CARE_PROVIDER_SITE_OTHER): Payer: Medicare Other | Admitting: Psychology

## 2017-07-12 DIAGNOSIS — F444 Conversion disorder with motor symptom or deficit: Secondary | ICD-10-CM

## 2017-07-26 ENCOUNTER — Ambulatory Visit (INDEPENDENT_AMBULATORY_CARE_PROVIDER_SITE_OTHER): Payer: Medicare Other | Admitting: Psychology

## 2017-07-26 DIAGNOSIS — F444 Conversion disorder with motor symptom or deficit: Secondary | ICD-10-CM

## 2017-08-08 DIAGNOSIS — F411 Generalized anxiety disorder: Secondary | ICD-10-CM | POA: Diagnosis not present

## 2017-08-09 ENCOUNTER — Ambulatory Visit (INDEPENDENT_AMBULATORY_CARE_PROVIDER_SITE_OTHER): Payer: Medicare Other | Admitting: Psychology

## 2017-08-09 DIAGNOSIS — F444 Conversion disorder with motor symptom or deficit: Secondary | ICD-10-CM

## 2017-08-23 ENCOUNTER — Ambulatory Visit (INDEPENDENT_AMBULATORY_CARE_PROVIDER_SITE_OTHER): Payer: Medicare Other | Admitting: Psychology

## 2017-08-23 DIAGNOSIS — F444 Conversion disorder with motor symptom or deficit: Secondary | ICD-10-CM

## 2017-09-21 ENCOUNTER — Ambulatory Visit (INDEPENDENT_AMBULATORY_CARE_PROVIDER_SITE_OTHER): Payer: Medicare Other | Admitting: Psychology

## 2017-09-21 DIAGNOSIS — F444 Conversion disorder with motor symptom or deficit: Secondary | ICD-10-CM

## 2017-09-26 DIAGNOSIS — F411 Generalized anxiety disorder: Secondary | ICD-10-CM | POA: Diagnosis not present

## 2017-10-18 ENCOUNTER — Ambulatory Visit (INDEPENDENT_AMBULATORY_CARE_PROVIDER_SITE_OTHER): Payer: Medicare Other | Admitting: Psychology

## 2017-10-18 DIAGNOSIS — F444 Conversion disorder with motor symptom or deficit: Secondary | ICD-10-CM | POA: Diagnosis not present

## 2017-10-30 DIAGNOSIS — F411 Generalized anxiety disorder: Secondary | ICD-10-CM | POA: Diagnosis not present

## 2017-11-01 ENCOUNTER — Ambulatory Visit (INDEPENDENT_AMBULATORY_CARE_PROVIDER_SITE_OTHER): Payer: Medicare Other | Admitting: Psychology

## 2017-11-01 DIAGNOSIS — F444 Conversion disorder with motor symptom or deficit: Secondary | ICD-10-CM | POA: Diagnosis not present

## 2017-11-08 ENCOUNTER — Ambulatory Visit (INDEPENDENT_AMBULATORY_CARE_PROVIDER_SITE_OTHER): Payer: Medicare Other | Admitting: Psychology

## 2017-11-08 DIAGNOSIS — F444 Conversion disorder with motor symptom or deficit: Secondary | ICD-10-CM | POA: Diagnosis not present

## 2017-11-15 ENCOUNTER — Ambulatory Visit (INDEPENDENT_AMBULATORY_CARE_PROVIDER_SITE_OTHER): Payer: Medicare Other | Admitting: Psychology

## 2017-11-15 DIAGNOSIS — F444 Conversion disorder with motor symptom or deficit: Secondary | ICD-10-CM | POA: Diagnosis not present

## 2017-11-27 ENCOUNTER — Ambulatory Visit (INDEPENDENT_AMBULATORY_CARE_PROVIDER_SITE_OTHER): Payer: Medicare Other | Admitting: Psychology

## 2017-11-27 DIAGNOSIS — F444 Conversion disorder with motor symptom or deficit: Secondary | ICD-10-CM | POA: Diagnosis not present

## 2017-11-28 DIAGNOSIS — F411 Generalized anxiety disorder: Secondary | ICD-10-CM | POA: Diagnosis not present

## 2017-12-05 DIAGNOSIS — R5383 Other fatigue: Secondary | ICD-10-CM | POA: Diagnosis not present

## 2017-12-05 DIAGNOSIS — F439 Reaction to severe stress, unspecified: Secondary | ICD-10-CM | POA: Diagnosis not present

## 2017-12-05 DIAGNOSIS — F449 Dissociative and conversion disorder, unspecified: Secondary | ICD-10-CM | POA: Diagnosis not present

## 2017-12-13 ENCOUNTER — Ambulatory Visit (INDEPENDENT_AMBULATORY_CARE_PROVIDER_SITE_OTHER): Payer: Medicare Other | Admitting: Psychology

## 2017-12-13 DIAGNOSIS — F444 Conversion disorder with motor symptom or deficit: Secondary | ICD-10-CM

## 2017-12-26 DIAGNOSIS — E782 Mixed hyperlipidemia: Secondary | ICD-10-CM | POA: Diagnosis not present

## 2017-12-26 DIAGNOSIS — D509 Iron deficiency anemia, unspecified: Secondary | ICD-10-CM | POA: Diagnosis not present

## 2017-12-26 DIAGNOSIS — F449 Dissociative and conversion disorder, unspecified: Secondary | ICD-10-CM | POA: Diagnosis not present

## 2017-12-26 DIAGNOSIS — R55 Syncope and collapse: Secondary | ICD-10-CM | POA: Diagnosis not present

## 2017-12-26 DIAGNOSIS — E559 Vitamin D deficiency, unspecified: Secondary | ICD-10-CM | POA: Diagnosis not present

## 2017-12-26 DIAGNOSIS — F439 Reaction to severe stress, unspecified: Secondary | ICD-10-CM | POA: Diagnosis not present

## 2018-01-05 DIAGNOSIS — I8393 Asymptomatic varicose veins of bilateral lower extremities: Secondary | ICD-10-CM | POA: Diagnosis not present

## 2018-01-05 DIAGNOSIS — R58 Hemorrhage, not elsewhere classified: Secondary | ICD-10-CM | POA: Diagnosis not present

## 2018-01-10 ENCOUNTER — Ambulatory Visit (INDEPENDENT_AMBULATORY_CARE_PROVIDER_SITE_OTHER): Payer: Medicare Other | Admitting: Psychology

## 2018-01-10 DIAGNOSIS — F444 Conversion disorder with motor symptom or deficit: Secondary | ICD-10-CM

## 2018-01-11 DIAGNOSIS — F5101 Primary insomnia: Secondary | ICD-10-CM | POA: Diagnosis not present

## 2018-01-11 DIAGNOSIS — F447 Conversion disorder with mixed symptom presentation: Secondary | ICD-10-CM | POA: Diagnosis not present

## 2018-01-11 DIAGNOSIS — F4312 Post-traumatic stress disorder, chronic: Secondary | ICD-10-CM | POA: Diagnosis not present

## 2018-01-11 DIAGNOSIS — F411 Generalized anxiety disorder: Secondary | ICD-10-CM | POA: Diagnosis not present

## 2018-01-12 DIAGNOSIS — Z23 Encounter for immunization: Secondary | ICD-10-CM | POA: Diagnosis not present

## 2018-01-15 ENCOUNTER — Ambulatory Visit (INDEPENDENT_AMBULATORY_CARE_PROVIDER_SITE_OTHER): Payer: Medicare Other | Admitting: Psychology

## 2018-01-15 DIAGNOSIS — F4310 Post-traumatic stress disorder, unspecified: Secondary | ICD-10-CM

## 2018-01-22 DIAGNOSIS — F411 Generalized anxiety disorder: Secondary | ICD-10-CM | POA: Diagnosis not present

## 2018-01-23 ENCOUNTER — Ambulatory Visit (INDEPENDENT_AMBULATORY_CARE_PROVIDER_SITE_OTHER): Payer: Medicare Other | Admitting: Psychology

## 2018-01-23 DIAGNOSIS — F4310 Post-traumatic stress disorder, unspecified: Secondary | ICD-10-CM | POA: Diagnosis not present

## 2018-01-24 ENCOUNTER — Ambulatory Visit: Payer: Medicare Other | Admitting: Psychology

## 2018-01-29 ENCOUNTER — Ambulatory Visit (INDEPENDENT_AMBULATORY_CARE_PROVIDER_SITE_OTHER): Payer: Medicare Other | Admitting: Psychology

## 2018-01-29 DIAGNOSIS — F4310 Post-traumatic stress disorder, unspecified: Secondary | ICD-10-CM

## 2018-02-05 ENCOUNTER — Ambulatory Visit (INDEPENDENT_AMBULATORY_CARE_PROVIDER_SITE_OTHER): Payer: Medicare Other | Admitting: Psychology

## 2018-02-05 DIAGNOSIS — F4310 Post-traumatic stress disorder, unspecified: Secondary | ICD-10-CM

## 2018-02-07 ENCOUNTER — Ambulatory Visit: Payer: Medicare Other | Admitting: Psychology

## 2018-02-08 DIAGNOSIS — F4312 Post-traumatic stress disorder, chronic: Secondary | ICD-10-CM | POA: Diagnosis not present

## 2018-02-08 DIAGNOSIS — F411 Generalized anxiety disorder: Secondary | ICD-10-CM | POA: Diagnosis not present

## 2018-02-08 DIAGNOSIS — F5101 Primary insomnia: Secondary | ICD-10-CM | POA: Diagnosis not present

## 2018-02-08 DIAGNOSIS — F447 Conversion disorder with mixed symptom presentation: Secondary | ICD-10-CM | POA: Diagnosis not present

## 2018-02-14 ENCOUNTER — Ambulatory Visit (INDEPENDENT_AMBULATORY_CARE_PROVIDER_SITE_OTHER): Payer: Medicare Other | Admitting: Psychology

## 2018-02-14 DIAGNOSIS — F4310 Post-traumatic stress disorder, unspecified: Secondary | ICD-10-CM | POA: Diagnosis not present

## 2018-02-28 ENCOUNTER — Ambulatory Visit (INDEPENDENT_AMBULATORY_CARE_PROVIDER_SITE_OTHER): Payer: Medicare Other | Admitting: Psychology

## 2018-02-28 DIAGNOSIS — F4310 Post-traumatic stress disorder, unspecified: Secondary | ICD-10-CM | POA: Diagnosis not present

## 2018-03-07 ENCOUNTER — Ambulatory Visit (INDEPENDENT_AMBULATORY_CARE_PROVIDER_SITE_OTHER): Payer: Medicare Other | Admitting: Psychology

## 2018-03-07 DIAGNOSIS — F4310 Post-traumatic stress disorder, unspecified: Secondary | ICD-10-CM

## 2018-03-12 DIAGNOSIS — F411 Generalized anxiety disorder: Secondary | ICD-10-CM | POA: Diagnosis not present

## 2018-03-12 DIAGNOSIS — F4312 Post-traumatic stress disorder, chronic: Secondary | ICD-10-CM | POA: Diagnosis not present

## 2018-03-12 DIAGNOSIS — F5101 Primary insomnia: Secondary | ICD-10-CM | POA: Diagnosis not present

## 2018-03-12 DIAGNOSIS — F447 Conversion disorder with mixed symptom presentation: Secondary | ICD-10-CM | POA: Diagnosis not present

## 2018-03-13 ENCOUNTER — Ambulatory Visit (INDEPENDENT_AMBULATORY_CARE_PROVIDER_SITE_OTHER): Payer: Medicare Other | Admitting: Psychology

## 2018-03-13 DIAGNOSIS — F4310 Post-traumatic stress disorder, unspecified: Secondary | ICD-10-CM | POA: Diagnosis not present

## 2018-03-19 DIAGNOSIS — Z681 Body mass index (BMI) 19 or less, adult: Secondary | ICD-10-CM | POA: Diagnosis not present

## 2018-03-19 DIAGNOSIS — J209 Acute bronchitis, unspecified: Secondary | ICD-10-CM | POA: Diagnosis not present

## 2018-03-19 DIAGNOSIS — R0989 Other specified symptoms and signs involving the circulatory and respiratory systems: Secondary | ICD-10-CM | POA: Diagnosis not present

## 2018-03-19 DIAGNOSIS — R634 Abnormal weight loss: Secondary | ICD-10-CM | POA: Diagnosis not present

## 2018-03-21 ENCOUNTER — Ambulatory Visit (INDEPENDENT_AMBULATORY_CARE_PROVIDER_SITE_OTHER): Payer: Medicare Other | Admitting: Psychology

## 2018-03-21 DIAGNOSIS — F4310 Post-traumatic stress disorder, unspecified: Secondary | ICD-10-CM | POA: Diagnosis not present

## 2018-04-02 ENCOUNTER — Ambulatory Visit (INDEPENDENT_AMBULATORY_CARE_PROVIDER_SITE_OTHER): Payer: Medicare Other | Admitting: Psychology

## 2018-04-02 DIAGNOSIS — F4310 Post-traumatic stress disorder, unspecified: Secondary | ICD-10-CM

## 2018-04-03 DIAGNOSIS — J209 Acute bronchitis, unspecified: Secondary | ICD-10-CM | POA: Diagnosis not present

## 2018-04-09 DIAGNOSIS — J441 Chronic obstructive pulmonary disease with (acute) exacerbation: Secondary | ICD-10-CM | POA: Diagnosis not present

## 2018-04-09 DIAGNOSIS — J209 Acute bronchitis, unspecified: Secondary | ICD-10-CM | POA: Diagnosis not present

## 2018-04-09 DIAGNOSIS — R0989 Other specified symptoms and signs involving the circulatory and respiratory systems: Secondary | ICD-10-CM | POA: Diagnosis not present

## 2018-04-12 ENCOUNTER — Ambulatory Visit (INDEPENDENT_AMBULATORY_CARE_PROVIDER_SITE_OTHER): Payer: Medicare Other | Admitting: Psychology

## 2018-04-12 DIAGNOSIS — F4312 Post-traumatic stress disorder, chronic: Secondary | ICD-10-CM | POA: Diagnosis not present

## 2018-04-12 DIAGNOSIS — F5101 Primary insomnia: Secondary | ICD-10-CM | POA: Diagnosis not present

## 2018-04-12 DIAGNOSIS — F411 Generalized anxiety disorder: Secondary | ICD-10-CM | POA: Diagnosis not present

## 2018-04-12 DIAGNOSIS — F4310 Post-traumatic stress disorder, unspecified: Secondary | ICD-10-CM

## 2018-04-12 DIAGNOSIS — F447 Conversion disorder with mixed symptom presentation: Secondary | ICD-10-CM | POA: Diagnosis not present

## 2018-04-17 ENCOUNTER — Ambulatory Visit (INDEPENDENT_AMBULATORY_CARE_PROVIDER_SITE_OTHER): Payer: Medicare Other | Admitting: Psychology

## 2018-04-17 DIAGNOSIS — F4310 Post-traumatic stress disorder, unspecified: Secondary | ICD-10-CM | POA: Diagnosis not present

## 2018-04-18 ENCOUNTER — Ambulatory Visit: Payer: Self-pay | Admitting: Psychology

## 2018-04-26 ENCOUNTER — Ambulatory Visit (INDEPENDENT_AMBULATORY_CARE_PROVIDER_SITE_OTHER): Payer: Medicare Other | Admitting: Psychology

## 2018-04-26 DIAGNOSIS — F4310 Post-traumatic stress disorder, unspecified: Secondary | ICD-10-CM | POA: Diagnosis not present

## 2018-05-02 ENCOUNTER — Ambulatory Visit (INDEPENDENT_AMBULATORY_CARE_PROVIDER_SITE_OTHER): Payer: Medicare Other | Admitting: Psychology

## 2018-05-02 DIAGNOSIS — F4310 Post-traumatic stress disorder, unspecified: Secondary | ICD-10-CM | POA: Diagnosis not present

## 2018-05-04 ENCOUNTER — Ambulatory Visit: Payer: Medicare Other | Admitting: Psychology

## 2018-05-08 DIAGNOSIS — E559 Vitamin D deficiency, unspecified: Secondary | ICD-10-CM | POA: Diagnosis not present

## 2018-05-08 DIAGNOSIS — E782 Mixed hyperlipidemia: Secondary | ICD-10-CM | POA: Diagnosis not present

## 2018-05-08 DIAGNOSIS — Z1211 Encounter for screening for malignant neoplasm of colon: Secondary | ICD-10-CM | POA: Diagnosis not present

## 2018-05-08 DIAGNOSIS — D509 Iron deficiency anemia, unspecified: Secondary | ICD-10-CM | POA: Diagnosis not present

## 2018-05-08 DIAGNOSIS — R062 Wheezing: Secondary | ICD-10-CM | POA: Diagnosis not present

## 2018-05-08 DIAGNOSIS — D680 Von Willebrand's disease: Secondary | ICD-10-CM | POA: Diagnosis not present

## 2018-05-08 DIAGNOSIS — Z0000 Encounter for general adult medical examination without abnormal findings: Secondary | ICD-10-CM | POA: Diagnosis not present

## 2018-05-08 DIAGNOSIS — Z1159 Encounter for screening for other viral diseases: Secondary | ICD-10-CM | POA: Diagnosis not present

## 2018-05-08 DIAGNOSIS — J209 Acute bronchitis, unspecified: Secondary | ICD-10-CM | POA: Diagnosis not present

## 2018-05-10 ENCOUNTER — Ambulatory Visit (INDEPENDENT_AMBULATORY_CARE_PROVIDER_SITE_OTHER): Payer: Medicare Other | Admitting: Psychology

## 2018-05-10 DIAGNOSIS — F4310 Post-traumatic stress disorder, unspecified: Secondary | ICD-10-CM | POA: Diagnosis not present

## 2018-05-16 ENCOUNTER — Ambulatory Visit: Payer: Medicare Other | Admitting: Psychology

## 2018-05-16 DIAGNOSIS — F5101 Primary insomnia: Secondary | ICD-10-CM | POA: Diagnosis not present

## 2018-05-16 DIAGNOSIS — F411 Generalized anxiety disorder: Secondary | ICD-10-CM | POA: Diagnosis not present

## 2018-05-16 DIAGNOSIS — F447 Conversion disorder with mixed symptom presentation: Secondary | ICD-10-CM | POA: Diagnosis not present

## 2018-05-16 DIAGNOSIS — F4312 Post-traumatic stress disorder, chronic: Secondary | ICD-10-CM | POA: Diagnosis not present

## 2018-05-18 ENCOUNTER — Ambulatory Visit (INDEPENDENT_AMBULATORY_CARE_PROVIDER_SITE_OTHER): Payer: Medicare Other | Admitting: Psychology

## 2018-05-18 DIAGNOSIS — F4310 Post-traumatic stress disorder, unspecified: Secondary | ICD-10-CM

## 2018-05-25 ENCOUNTER — Ambulatory Visit (INDEPENDENT_AMBULATORY_CARE_PROVIDER_SITE_OTHER): Payer: Medicare Other | Admitting: Psychology

## 2018-05-25 DIAGNOSIS — F4310 Post-traumatic stress disorder, unspecified: Secondary | ICD-10-CM | POA: Diagnosis not present

## 2018-05-30 ENCOUNTER — Ambulatory Visit: Payer: Medicare Other | Admitting: Psychology

## 2018-05-30 ENCOUNTER — Ambulatory Visit (INDEPENDENT_AMBULATORY_CARE_PROVIDER_SITE_OTHER): Payer: Medicare Other | Admitting: Psychology

## 2018-05-30 DIAGNOSIS — F4310 Post-traumatic stress disorder, unspecified: Secondary | ICD-10-CM

## 2018-06-01 ENCOUNTER — Ambulatory Visit: Payer: Medicare Other | Admitting: Psychology

## 2018-06-07 ENCOUNTER — Ambulatory Visit (INDEPENDENT_AMBULATORY_CARE_PROVIDER_SITE_OTHER): Payer: Medicare Other | Admitting: Psychology

## 2018-06-07 DIAGNOSIS — F4310 Post-traumatic stress disorder, unspecified: Secondary | ICD-10-CM | POA: Diagnosis not present

## 2018-06-14 DIAGNOSIS — F447 Conversion disorder with mixed symptom presentation: Secondary | ICD-10-CM | POA: Diagnosis not present

## 2018-06-14 DIAGNOSIS — F4312 Post-traumatic stress disorder, chronic: Secondary | ICD-10-CM | POA: Diagnosis not present

## 2018-06-14 DIAGNOSIS — F411 Generalized anxiety disorder: Secondary | ICD-10-CM | POA: Diagnosis not present

## 2018-06-14 DIAGNOSIS — F5101 Primary insomnia: Secondary | ICD-10-CM | POA: Diagnosis not present

## 2018-06-15 ENCOUNTER — Ambulatory Visit (INDEPENDENT_AMBULATORY_CARE_PROVIDER_SITE_OTHER): Payer: Medicare Other | Admitting: Psychology

## 2018-06-15 DIAGNOSIS — F4310 Post-traumatic stress disorder, unspecified: Secondary | ICD-10-CM | POA: Diagnosis not present

## 2018-06-21 ENCOUNTER — Ambulatory Visit: Payer: Medicare Other | Admitting: Psychology

## 2018-06-26 ENCOUNTER — Ambulatory Visit: Payer: Medicare Other | Admitting: Psychology

## 2018-06-28 ENCOUNTER — Ambulatory Visit: Payer: Medicare Other | Admitting: Psychology

## 2018-06-28 ENCOUNTER — Ambulatory Visit (INDEPENDENT_AMBULATORY_CARE_PROVIDER_SITE_OTHER): Payer: Medicare Other | Admitting: Psychology

## 2018-06-28 DIAGNOSIS — F4310 Post-traumatic stress disorder, unspecified: Secondary | ICD-10-CM | POA: Diagnosis not present

## 2018-07-05 ENCOUNTER — Ambulatory Visit (INDEPENDENT_AMBULATORY_CARE_PROVIDER_SITE_OTHER): Payer: Medicare Other | Admitting: Psychology

## 2018-07-05 DIAGNOSIS — F4310 Post-traumatic stress disorder, unspecified: Secondary | ICD-10-CM | POA: Diagnosis not present

## 2018-07-12 ENCOUNTER — Ambulatory Visit (INDEPENDENT_AMBULATORY_CARE_PROVIDER_SITE_OTHER): Payer: Medicare Other | Admitting: Psychology

## 2018-07-12 DIAGNOSIS — F4310 Post-traumatic stress disorder, unspecified: Secondary | ICD-10-CM | POA: Diagnosis not present

## 2018-07-18 ENCOUNTER — Ambulatory Visit (INDEPENDENT_AMBULATORY_CARE_PROVIDER_SITE_OTHER): Payer: Medicare Other | Admitting: Psychology

## 2018-07-18 DIAGNOSIS — F4310 Post-traumatic stress disorder, unspecified: Secondary | ICD-10-CM | POA: Diagnosis not present

## 2018-07-26 ENCOUNTER — Ambulatory Visit (INDEPENDENT_AMBULATORY_CARE_PROVIDER_SITE_OTHER): Payer: Medicare Other | Admitting: Psychology

## 2018-07-26 DIAGNOSIS — F4310 Post-traumatic stress disorder, unspecified: Secondary | ICD-10-CM | POA: Diagnosis not present

## 2018-08-01 ENCOUNTER — Ambulatory Visit (INDEPENDENT_AMBULATORY_CARE_PROVIDER_SITE_OTHER): Payer: Medicare Other | Admitting: Psychology

## 2018-08-01 DIAGNOSIS — F341 Dysthymic disorder: Secondary | ICD-10-CM | POA: Diagnosis not present

## 2018-08-02 DIAGNOSIS — J449 Chronic obstructive pulmonary disease, unspecified: Secondary | ICD-10-CM | POA: Diagnosis not present

## 2018-08-02 DIAGNOSIS — G4700 Insomnia, unspecified: Secondary | ICD-10-CM | POA: Diagnosis not present

## 2018-08-02 DIAGNOSIS — J309 Allergic rhinitis, unspecified: Secondary | ICD-10-CM | POA: Diagnosis not present

## 2018-08-02 DIAGNOSIS — F4310 Post-traumatic stress disorder, unspecified: Secondary | ICD-10-CM | POA: Diagnosis not present

## 2018-08-07 DIAGNOSIS — F4312 Post-traumatic stress disorder, chronic: Secondary | ICD-10-CM | POA: Diagnosis not present

## 2018-08-07 DIAGNOSIS — F411 Generalized anxiety disorder: Secondary | ICD-10-CM | POA: Diagnosis not present

## 2018-08-07 DIAGNOSIS — F5101 Primary insomnia: Secondary | ICD-10-CM | POA: Diagnosis not present

## 2018-08-07 DIAGNOSIS — F447 Conversion disorder with mixed symptom presentation: Secondary | ICD-10-CM | POA: Diagnosis not present

## 2018-08-09 ENCOUNTER — Ambulatory Visit (INDEPENDENT_AMBULATORY_CARE_PROVIDER_SITE_OTHER): Payer: Medicare Other | Admitting: Psychology

## 2018-08-09 DIAGNOSIS — F430 Acute stress reaction: Secondary | ICD-10-CM | POA: Diagnosis not present

## 2018-08-13 ENCOUNTER — Ambulatory Visit (INDEPENDENT_AMBULATORY_CARE_PROVIDER_SITE_OTHER): Payer: Medicare Other | Admitting: Psychology

## 2018-08-13 DIAGNOSIS — F4310 Post-traumatic stress disorder, unspecified: Secondary | ICD-10-CM

## 2018-08-20 ENCOUNTER — Institutional Professional Consult (permissible substitution): Payer: Self-pay | Admitting: Pulmonary Disease

## 2018-08-21 ENCOUNTER — Ambulatory Visit (INDEPENDENT_AMBULATORY_CARE_PROVIDER_SITE_OTHER): Payer: Medicare Other | Admitting: Psychology

## 2018-08-21 DIAGNOSIS — F4310 Post-traumatic stress disorder, unspecified: Secondary | ICD-10-CM | POA: Diagnosis not present

## 2018-08-27 ENCOUNTER — Ambulatory Visit (INDEPENDENT_AMBULATORY_CARE_PROVIDER_SITE_OTHER): Payer: Medicare Other | Admitting: Psychology

## 2018-08-27 DIAGNOSIS — F4310 Post-traumatic stress disorder, unspecified: Secondary | ICD-10-CM

## 2018-09-04 DIAGNOSIS — F4312 Post-traumatic stress disorder, chronic: Secondary | ICD-10-CM | POA: Diagnosis not present

## 2018-09-04 DIAGNOSIS — F447 Conversion disorder with mixed symptom presentation: Secondary | ICD-10-CM | POA: Diagnosis not present

## 2018-09-04 DIAGNOSIS — F411 Generalized anxiety disorder: Secondary | ICD-10-CM | POA: Diagnosis not present

## 2018-09-04 DIAGNOSIS — F5101 Primary insomnia: Secondary | ICD-10-CM | POA: Diagnosis not present

## 2018-09-05 ENCOUNTER — Ambulatory Visit (INDEPENDENT_AMBULATORY_CARE_PROVIDER_SITE_OTHER): Payer: Medicare Other | Admitting: Psychology

## 2018-09-05 DIAGNOSIS — F4310 Post-traumatic stress disorder, unspecified: Secondary | ICD-10-CM | POA: Diagnosis not present

## 2018-09-06 ENCOUNTER — Ambulatory Visit (INDEPENDENT_AMBULATORY_CARE_PROVIDER_SITE_OTHER): Payer: Medicare Other | Admitting: Internal Medicine

## 2018-09-06 ENCOUNTER — Other Ambulatory Visit: Payer: Self-pay

## 2018-09-06 ENCOUNTER — Encounter: Payer: Self-pay | Admitting: Internal Medicine

## 2018-09-06 ENCOUNTER — Ambulatory Visit (INDEPENDENT_AMBULATORY_CARE_PROVIDER_SITE_OTHER): Payer: Self-pay

## 2018-09-06 DIAGNOSIS — R058 Other specified cough: Secondary | ICD-10-CM

## 2018-09-06 DIAGNOSIS — J449 Chronic obstructive pulmonary disease, unspecified: Secondary | ICD-10-CM | POA: Diagnosis not present

## 2018-09-06 DIAGNOSIS — R05 Cough: Secondary | ICD-10-CM | POA: Diagnosis not present

## 2018-09-06 DIAGNOSIS — R059 Cough, unspecified: Secondary | ICD-10-CM

## 2018-09-06 HISTORY — DX: Other specified cough: R05.8

## 2018-09-06 NOTE — Patient Instructions (Signed)
We will call you to schedule sinus CT and arrange a follow up with ent if needed    Please remember to go to the  x-ray department  for your tests - we will call you with the results when they are available and arrange follow up.

## 2018-09-06 NOTE — Progress Notes (Signed)
Spoke with pt and notified of results per Dr. Wert. Pt verbalized understanding and denied any questions. 

## 2018-09-06 NOTE — Assessment & Plan Note (Addendum)
Onset Nov 2019 with uri - Sinus CT ordered  Upper airway cough syndrome (previously labeled PNDS),  is so named because it's frequently impossible to sort out how much is  CR/sinusitis with freq throat clearing (which can be related to primary GERD)   vs  causing  secondary (" extra esophageal")  GERD from wide swings in gastric pressure that occur with throat clearing, often  promoting self use of mint and menthol lozenges that reduce the lower esophageal sphincter tone and exacerbate the problem further in a cyclical fashion.   These are the same pts (now being labeled as having "irritable larynx syndrome" by some cough centers) who not infrequently have a history of having failed to tolerate ace inhibitors,  dry powder inhalers or biphosphonates or report having atypical/extraesophageal reflux symptoms that don't respond to standard doses of PPI  and are easily confused as having aecopd or asthma flares by even experienced allergists/ pulmonologists (myself included).   For now will focus on eval of upper airway starting with sinus CT and considering allergy eval next as I strongly doubt she has significant copd

## 2018-09-06 NOTE — Progress Notes (Signed)
Dawn Simmons, female    DOB: [birth year], 73 y.o.   MRN: [id number]   Brief patient profile:  72 yowf quit smoking 1974 s sequelae but even back in the 1970s noted tendency to take  a lot longer than other people when gets uri's to get over "it"  = cough min productive s sob/ generalized malaise  but not sob no better with saba with another flare in Nov 2019 3 d p attending wedding assoc with runny nose (new symptom), real tired, cough more than usual but still not sob with cxr dx ? Copd on  03/20/18 so referred to pulmonary clinic 09/06/2018 by Dr   Dawn Rumpf NP with Dawn [facility] [employer]       History of Present Illness  09/06/2018  Pulmonary/ 1st office eval/  Chief Complaint  Patient presents with  . Pulmonary Consult    Referred by Dawn Simmons- cough in the am since Nov 2019- clear sputum.   Dyspnea:  Not limited by breathing from desired activities / legs give out before breathing Cough: wakes up stuffy head each /lots of mucus out of her nose and throat but all clear / mucoid/ thick Sleep: sleeping fine on her back  p benzo's at hs  SABA use: none   No obvious day to day or daytime variability or assoc   purulent sputum or mucus plugs or hemoptysis or cp or chest tightness, subjective wheeze or overt  hb symptoms.   Sleeping as above without nocturnal    exacerbation  of respiratory  c/o's or need for noct saba. Also denies any obvious fluctuation of symptoms with weather or environmental changes or other aggravating or alleviating factors except as outlined above   No unusual exposure hx or h/o childhood pna/ asthma or knowledge of premature birth.  Current Allergies, Complete Past Medical History, Past Surgical History, Family History, and Social History were reviewed in [employer] record.  ROS  The following are not active complaints unless bolded Hoarseness, sore throat, dysphagia, dental problems, itching, sneezing,  nasal congestion or  discharge of excess mucus or purulent secretions, ear ache,   fever, chills, sweats, unintended wt loss or wt gain, classically pleuritic or exertional cp,  orthopnea pnd or arm/hand swelling  or leg swelling, presyncope, palpitations, abdominal pain, anorexia, nausea, vomiting, diarrhea  or change in bowel habits or change in bladder habits, change in stools or change in urine, dysuria, hematuria,  rash, arthralgias, visual complaints, headache, numbness, weakness or ataxia or problems with walking or coordination,  change in mood or  memory.            Past Medical History:  Diagnosis Date  . H/O fibromyalgia   . H/O insomnia   . History of palpitations   . Mitral valve prolapse   . Palpitations   . Syncope   . Von Willebrand disease (HCC)     Outpatient Medications Prior to Visit  Medication Sig Dispense Refill  . Ascorbic Acid (VITAMIN C) 100 MG tablet Take 100 mg by mouth daily.    . Calcium-Magnesium 200-100 MG TABS Take 4 tablets by mouth daily. 2 tablets in the morning and 2 tablets in the evening    . diazepam (VALIUM) 5 MG tablet Take 5 mg by mouth every 6 (six) hours as needed for anxiety or muscle spasms. TAKE 1/2 TO 1 TABLET AT BED TIME as needed    . FLUoxetine (PROZAC) 10 MG capsule Take 30 mg by mouth  daily.    . Multiple Vitamin (MULTIVITAMIN) capsule Take 1 capsule by mouth daily.    Marland Kitchen co-enzyme Q-10 50 MG capsule Take 50 mg by mouth daily.    . Fish Oil OIL Take 2,000 mg by mouth daily.     . Homeopathic Products (ARNICA) GEL Apply topically as needed (as needed to affected areas).     . Probiotic Product (PROBIOTIC FORMULA PO) Take 1 tablet by mouth daily.     . vitamin B-12 (CYANOCOBALAMIN) 1000 MCG tablet Take 1,000 mcg by mouth daily.    Marland Kitchen VITAMIN K PO Take 90 mcg by mouth daily.        Objective:     BP 134/62 (BP Location: Left Arm, Cuff Size: Normal)   Pulse 87   Temp 97.7 F (36.5 C) (Oral)   Ht  (1.651 m)   Wt 110 lb 3.2 oz (50 kg)   SpO2 99%    BMI 18.34 kg/m   SpO2: 99 %  RA  Thin talkative amb wf nad   HEENT: nl dentition, turbinates bilaterally, and oropharynx. Nl external ear canals without cough reflex   NECK :  without JVD/Nodes/TM/ nl carotid upstrokes bilaterally   LUNGS: no acc muscle use,  slt barrel contour chest which is clear to A and P bilaterally without cough on insp or exp maneuvers   CV:  RRR  no s3 or murmur or increase in P2, and no edema   ABD:  soft and nontender with nl inspiratory excursion in the supine position. No bruits or organomegaly appreciated, bowel sounds nl  MS:  Nl gait/ ext warm without deformities, calf tenderness, cyanosis or clubbing No obvious joint restrictions   SKIN: warm and dry without lesions    NEURO:  alert, approp, nl sensorium with  no motor or cerebellar deficits apparent.     CXR PA and Lateral:   09/06/2018 :    I personally reviewed images / radiology impression as follows:   COPD without evidence of an acute abnormality. My impression :  Hyperinflation is mild/mod not severe       Assessment   Upper airway cough syndrome Onset Nov 2019 with uri - Sinus CT ordered  Upper airway cough syndrome (previously labeled PNDS),  is so named because it's frequently impossible to sort out how much is  CR/sinusitis with freq throat clearing (which can be related to primary GERD)   vs  causing  secondary (" extra esophageal")  GERD from wide swings in gastric pressure that occur with throat clearing, often  promoting self use of mint and menthol lozenges that reduce the lower esophageal sphincter tone and exacerbate the problem further in a cyclical fashion.   These are the same pts (now being labeled as having "irritable larynx syndrome" by some cough centers) who not infrequently have a history of having failed to tolerate ace inhibitors,  dry powder inhalers or biphosphonates or report having atypical/extraesophageal reflux symptoms that don't respond to standard  doses of PPI  and are easily confused as having aecopd or asthma flares by even experienced allergists/ pulmonologists (myself included).   For now will focus on eval of upper airway starting with sinus CT and considering allergy eval next as I strongly doubt she has significant copd      COPD GOLD ? Quit smoking 1974 with copd changes on cxr but no limiting sob as of 09/06/2018  - 09/06/2018   Walked RA  2 laps @  approx 288ft  each @ fast pace  stopped due to  End of study, no sob and sats  100% at end   We can't do routine pfts in this type of pt until  COVID - 19 restrictions have been lifted but she does not really need them.   Explained to pt:  > 3 min discussion I reviewed the Fletcher curve with the patient that basically indicates  if you quit smoking when your best day FEV1 is still well preserved (as is clearly  the case here)  it is highly unlikely you will progress to severe disease and informed the patient there was  no medication on the market that has proven to alter the curve/ its downward trajectory  or the likelihood of progression of their disease(unlike other chronic medical conditions such as atheroclerosis where we do think we can change the natural hx with risk reducing meds)    Therefore stopping smoking and maintaining abstinence are  the most important aspects of her care, not choice of inhalers or for that matter, doctors.   Treatment other than smoking cessation  is entirely directed by severity of symptoms and focused also on reducing exacerbations, not attempting to change the natural history of the disease.     She is not limited by doe and I don't think she's having aecopd since nov 2019 (see uacs) but certainly some of her difficulties with URI's may be related to copd and I'd be happy to see her again during the flares if needed      Total time devoted to counseling  > 50 % of initial 60 min office visit:  reviewed case with pt/  directly observed portions of  ambulatory 02 saturation study/ discussion of options/alternatives/ personally creating written customized instructions  in presence of pt  then going over those specific  Instructions directly with the pt including how to use all of the meds but in particular covering each new medication in detail and the difference between the maintenance= "automatic" meds and the prns using an action plan format for the latter (If this problem/symptom => do that organization reading Left to right).  Please see AVS from this visit for a full list of these instructions which I personally wrote for this pt and  are unique to this visit.    Sandrea HughsMichael , MD 09/06/2018

## 2018-09-07 ENCOUNTER — Encounter: Payer: Self-pay | Admitting: Internal Medicine

## 2018-09-07 ENCOUNTER — Telehealth: Payer: Self-pay

## 2018-09-07 DIAGNOSIS — J449 Chronic obstructive pulmonary disease, unspecified: Secondary | ICD-10-CM

## 2018-09-07 HISTORY — DX: Chronic obstructive pulmonary disease, unspecified: J44.9

## 2018-09-07 NOTE — Addendum Note (Signed)
Addended by: Sheran Luz on: 09/07/2018 11:44 AM   Modules accepted: Orders

## 2018-09-07 NOTE — Assessment & Plan Note (Signed)
Quit smoking 1974 with copd changes on cxr but no limiting sob as of 09/06/2018  - 09/06/2018   Walked RA  2 laps @  approx 276ft each @ fast pace  stopped due to  End of study, no sob and sats  100% at end   We can't do routine pfts in this type of pt until  COVID - 19 restrictions have been lifted but she does not really need them.   Explained to pt:  > 3 min discussion I reviewed the Fletcher curve with the patient that basically indicates  if you quit smoking when your best day FEV1 is still well preserved (as is clearly  the case here)  it is highly unlikely you will progress to severe disease and informed the patient there was  no medication on the market that has proven to alter the curve/ its downward trajectory  or the likelihood of progression of their disease(unlike other chronic medical conditions such as atheroclerosis where we do think we can change the natural hx with risk reducing meds)    Therefore stopping smoking and maintaining abstinence are  the most important aspects of her care, not choice of inhalers or for that matter, doctors.   Treatment other than smoking cessation  is entirely directed by severity of symptoms and focused also on reducing exacerbations, not attempting to change the natural history of the disease.     She is not limited by doe and I don't think she's having aecopd since nov 2019 (see uacs) but certainly some of her difficulties with URI's may be related to copd and I'd be happy to see her again during the flares if needed    Total time devoted to counseling  > 50 % of initial 60 min office visit:  reviewed case with pt/  directly observed portions of ambulatory 02 saturation study/ discussion of options/alternatives/ personally creating written customized instructions  in presence of pt  then going over those specific  Instructions directly with the pt including how to use all of the meds but in particular covering each new medication in detail and the  difference between the maintenance= "automatic" meds and the prns using an action plan format for the latter (If this problem/symptom => do that organization reading Left to right).  Please see AVS from this visit for a full list of these instructions which I personally wrote for this pt and  are unique to this visit.

## 2018-09-10 ENCOUNTER — Inpatient Hospital Stay: Admission: RE | Admit: 2018-09-10 | Payer: Medicare Other | Source: Ambulatory Visit

## 2018-09-10 NOTE — Telephone Encounter (Signed)
Error encouner

## 2018-09-12 ENCOUNTER — Ambulatory Visit (INDEPENDENT_AMBULATORY_CARE_PROVIDER_SITE_OTHER): Payer: Medicare Other | Admitting: Psychology

## 2018-09-12 DIAGNOSIS — F4310 Post-traumatic stress disorder, unspecified: Secondary | ICD-10-CM | POA: Diagnosis not present

## 2018-09-18 ENCOUNTER — Ambulatory Visit
Admission: RE | Admit: 2018-09-18 | Discharge: 2018-09-18 | Disposition: A | Discharge status: Home / Self Care | Payer: Medicare Other | Source: Ambulatory Visit | Attending: Internal Medicine | Admitting: Internal Medicine

## 2018-09-18 ENCOUNTER — Other Ambulatory Visit: Payer: Self-pay

## 2018-09-18 DIAGNOSIS — R059 Cough, unspecified: Secondary | ICD-10-CM

## 2018-09-18 DIAGNOSIS — R05 Cough: Secondary | ICD-10-CM

## 2018-09-19 ENCOUNTER — Ambulatory Visit (INDEPENDENT_AMBULATORY_CARE_PROVIDER_SITE_OTHER): Payer: Medicare Other | Admitting: Psychology

## 2018-09-19 DIAGNOSIS — F4310 Post-traumatic stress disorder, unspecified: Secondary | ICD-10-CM

## 2018-09-20 NOTE — Progress Notes (Signed)
Spoke with pt and notified of results per Dr. Wert. Pt verbalized understanding and denied any questions. 

## 2018-09-26 ENCOUNTER — Ambulatory Visit (INDEPENDENT_AMBULATORY_CARE_PROVIDER_SITE_OTHER): Payer: Medicare Other | Admitting: Psychology

## 2018-09-26 DIAGNOSIS — F4310 Post-traumatic stress disorder, unspecified: Secondary | ICD-10-CM

## 2018-10-03 ENCOUNTER — Ambulatory Visit (INDEPENDENT_AMBULATORY_CARE_PROVIDER_SITE_OTHER): Payer: Medicare Other | Admitting: Psychology

## 2018-10-03 DIAGNOSIS — F4310 Post-traumatic stress disorder, unspecified: Secondary | ICD-10-CM

## 2018-10-08 DIAGNOSIS — D509 Iron deficiency anemia, unspecified: Secondary | ICD-10-CM | POA: Diagnosis not present

## 2018-10-08 DIAGNOSIS — R5382 Chronic fatigue, unspecified: Secondary | ICD-10-CM | POA: Diagnosis not present

## 2018-10-09 ENCOUNTER — Ambulatory Visit (INDEPENDENT_AMBULATORY_CARE_PROVIDER_SITE_OTHER): Payer: Medicare Other | Admitting: Psychology

## 2018-10-09 DIAGNOSIS — F4310 Post-traumatic stress disorder, unspecified: Secondary | ICD-10-CM

## 2018-10-10 ENCOUNTER — Ambulatory Visit: Payer: Medicare Other | Admitting: Psychology

## 2018-10-11 DIAGNOSIS — F5101 Primary insomnia: Secondary | ICD-10-CM | POA: Diagnosis not present

## 2018-10-11 DIAGNOSIS — F4312 Post-traumatic stress disorder, chronic: Secondary | ICD-10-CM | POA: Diagnosis not present

## 2018-10-11 DIAGNOSIS — F411 Generalized anxiety disorder: Secondary | ICD-10-CM | POA: Diagnosis not present

## 2018-10-11 DIAGNOSIS — F447 Conversion disorder with mixed symptom presentation: Secondary | ICD-10-CM | POA: Diagnosis not present

## 2018-10-17 ENCOUNTER — Ambulatory Visit (INDEPENDENT_AMBULATORY_CARE_PROVIDER_SITE_OTHER): Payer: Medicare Other | Admitting: Psychology

## 2018-10-17 DIAGNOSIS — F4310 Post-traumatic stress disorder, unspecified: Secondary | ICD-10-CM | POA: Diagnosis not present

## 2018-10-25 ENCOUNTER — Ambulatory Visit (INDEPENDENT_AMBULATORY_CARE_PROVIDER_SITE_OTHER): Payer: Medicare Other | Admitting: Psychology

## 2018-10-25 DIAGNOSIS — R232 Flushing: Secondary | ICD-10-CM | POA: Diagnosis not present

## 2018-10-25 DIAGNOSIS — F4310 Post-traumatic stress disorder, unspecified: Secondary | ICD-10-CM

## 2018-10-25 DIAGNOSIS — D509 Iron deficiency anemia, unspecified: Secondary | ICD-10-CM | POA: Diagnosis not present

## 2018-10-25 DIAGNOSIS — R5382 Chronic fatigue, unspecified: Secondary | ICD-10-CM | POA: Diagnosis not present

## 2018-10-31 ENCOUNTER — Ambulatory Visit (INDEPENDENT_AMBULATORY_CARE_PROVIDER_SITE_OTHER): Payer: Medicare Other | Admitting: Psychology

## 2018-10-31 DIAGNOSIS — F4310 Post-traumatic stress disorder, unspecified: Secondary | ICD-10-CM | POA: Diagnosis not present

## 2018-11-02 DIAGNOSIS — R232 Flushing: Secondary | ICD-10-CM | POA: Diagnosis not present

## 2018-11-02 DIAGNOSIS — E782 Mixed hyperlipidemia: Secondary | ICD-10-CM | POA: Diagnosis not present

## 2018-11-02 DIAGNOSIS — F4310 Post-traumatic stress disorder, unspecified: Secondary | ICD-10-CM | POA: Diagnosis not present

## 2018-11-02 DIAGNOSIS — D509 Iron deficiency anemia, unspecified: Secondary | ICD-10-CM | POA: Diagnosis not present

## 2018-11-02 DIAGNOSIS — R5382 Chronic fatigue, unspecified: Secondary | ICD-10-CM | POA: Diagnosis not present

## 2018-11-08 ENCOUNTER — Ambulatory Visit (INDEPENDENT_AMBULATORY_CARE_PROVIDER_SITE_OTHER): Payer: Medicare Other | Admitting: Psychology

## 2018-11-08 DIAGNOSIS — F4310 Post-traumatic stress disorder, unspecified: Secondary | ICD-10-CM | POA: Diagnosis not present

## 2018-11-09 DIAGNOSIS — R5382 Chronic fatigue, unspecified: Secondary | ICD-10-CM | POA: Diagnosis not present

## 2018-11-14 ENCOUNTER — Ambulatory Visit (INDEPENDENT_AMBULATORY_CARE_PROVIDER_SITE_OTHER): Payer: Medicare Other | Admitting: Psychology

## 2018-11-14 DIAGNOSIS — F4310 Post-traumatic stress disorder, unspecified: Secondary | ICD-10-CM

## 2018-11-21 ENCOUNTER — Ambulatory Visit (INDEPENDENT_AMBULATORY_CARE_PROVIDER_SITE_OTHER): Payer: Medicare Other | Admitting: Psychology

## 2018-11-21 DIAGNOSIS — F4310 Post-traumatic stress disorder, unspecified: Secondary | ICD-10-CM | POA: Diagnosis not present

## 2018-11-22 DIAGNOSIS — R5383 Other fatigue: Secondary | ICD-10-CM | POA: Diagnosis not present

## 2018-11-22 DIAGNOSIS — M797 Fibromyalgia: Secondary | ICD-10-CM | POA: Diagnosis not present

## 2018-11-22 DIAGNOSIS — R5382 Chronic fatigue, unspecified: Secondary | ICD-10-CM | POA: Diagnosis not present

## 2018-11-22 DIAGNOSIS — D509 Iron deficiency anemia, unspecified: Secondary | ICD-10-CM | POA: Diagnosis not present

## 2018-11-22 DIAGNOSIS — R51 Headache: Secondary | ICD-10-CM | POA: Diagnosis not present

## 2018-11-28 DIAGNOSIS — F4312 Post-traumatic stress disorder, chronic: Secondary | ICD-10-CM | POA: Diagnosis not present

## 2018-11-28 DIAGNOSIS — F5101 Primary insomnia: Secondary | ICD-10-CM | POA: Diagnosis not present

## 2018-11-28 DIAGNOSIS — F447 Conversion disorder with mixed symptom presentation: Secondary | ICD-10-CM | POA: Diagnosis not present

## 2018-11-28 DIAGNOSIS — F411 Generalized anxiety disorder: Secondary | ICD-10-CM | POA: Diagnosis not present

## 2018-11-29 DIAGNOSIS — R5383 Other fatigue: Secondary | ICD-10-CM | POA: Diagnosis not present

## 2018-11-29 DIAGNOSIS — R51 Headache: Secondary | ICD-10-CM | POA: Diagnosis not present

## 2018-11-30 ENCOUNTER — Ambulatory Visit (INDEPENDENT_AMBULATORY_CARE_PROVIDER_SITE_OTHER): Payer: Medicare Other | Admitting: Psychology

## 2018-11-30 DIAGNOSIS — F4310 Post-traumatic stress disorder, unspecified: Secondary | ICD-10-CM | POA: Diagnosis not present

## 2018-12-06 ENCOUNTER — Ambulatory Visit (INDEPENDENT_AMBULATORY_CARE_PROVIDER_SITE_OTHER): Payer: Medicare Other | Admitting: Psychology

## 2018-12-06 DIAGNOSIS — M542 Cervicalgia: Secondary | ICD-10-CM | POA: Diagnosis not present

## 2018-12-06 DIAGNOSIS — F4310 Post-traumatic stress disorder, unspecified: Secondary | ICD-10-CM

## 2018-12-11 ENCOUNTER — Ambulatory Visit (INDEPENDENT_AMBULATORY_CARE_PROVIDER_SITE_OTHER): Payer: Medicare Other | Admitting: Psychology

## 2018-12-11 DIAGNOSIS — F4310 Post-traumatic stress disorder, unspecified: Secondary | ICD-10-CM | POA: Diagnosis not present

## 2018-12-19 ENCOUNTER — Ambulatory Visit (INDEPENDENT_AMBULATORY_CARE_PROVIDER_SITE_OTHER): Payer: Medicare Other | Admitting: Psychology

## 2018-12-19 DIAGNOSIS — F4310 Post-traumatic stress disorder, unspecified: Secondary | ICD-10-CM | POA: Diagnosis not present

## 2018-12-20 ENCOUNTER — Ambulatory Visit (INDEPENDENT_AMBULATORY_CARE_PROVIDER_SITE_OTHER): Payer: Medicare Other | Admitting: Psychology

## 2018-12-20 DIAGNOSIS — F4310 Post-traumatic stress disorder, unspecified: Secondary | ICD-10-CM | POA: Diagnosis not present

## 2018-12-28 ENCOUNTER — Ambulatory Visit (INDEPENDENT_AMBULATORY_CARE_PROVIDER_SITE_OTHER): Payer: Medicare Other | Admitting: Psychology

## 2018-12-28 DIAGNOSIS — F4310 Post-traumatic stress disorder, unspecified: Secondary | ICD-10-CM | POA: Diagnosis not present

## 2018-12-31 DIAGNOSIS — F4312 Post-traumatic stress disorder, chronic: Secondary | ICD-10-CM | POA: Diagnosis not present

## 2018-12-31 DIAGNOSIS — F5101 Primary insomnia: Secondary | ICD-10-CM | POA: Diagnosis not present

## 2018-12-31 DIAGNOSIS — F411 Generalized anxiety disorder: Secondary | ICD-10-CM | POA: Diagnosis not present

## 2018-12-31 DIAGNOSIS — F447 Conversion disorder with mixed symptom presentation: Secondary | ICD-10-CM | POA: Diagnosis not present

## 2019-01-03 ENCOUNTER — Ambulatory Visit (INDEPENDENT_AMBULATORY_CARE_PROVIDER_SITE_OTHER): Payer: Medicare Other | Admitting: Psychology

## 2019-01-03 DIAGNOSIS — F4310 Post-traumatic stress disorder, unspecified: Secondary | ICD-10-CM | POA: Diagnosis not present

## 2019-01-09 ENCOUNTER — Ambulatory Visit (INDEPENDENT_AMBULATORY_CARE_PROVIDER_SITE_OTHER): Payer: Medicare Other | Admitting: Psychology

## 2019-01-09 DIAGNOSIS — F4310 Post-traumatic stress disorder, unspecified: Secondary | ICD-10-CM

## 2019-01-15 DIAGNOSIS — M545 Low back pain: Secondary | ICD-10-CM | POA: Diagnosis not present

## 2019-01-15 DIAGNOSIS — M797 Fibromyalgia: Secondary | ICD-10-CM | POA: Diagnosis not present

## 2019-01-16 ENCOUNTER — Ambulatory Visit (INDEPENDENT_AMBULATORY_CARE_PROVIDER_SITE_OTHER): Payer: Medicare Other | Admitting: Psychology

## 2019-01-16 DIAGNOSIS — F4310 Post-traumatic stress disorder, unspecified: Secondary | ICD-10-CM

## 2019-01-23 ENCOUNTER — Ambulatory Visit (INDEPENDENT_AMBULATORY_CARE_PROVIDER_SITE_OTHER): Payer: Medicare Other | Admitting: Psychology

## 2019-01-23 DIAGNOSIS — F4310 Post-traumatic stress disorder, unspecified: Secondary | ICD-10-CM

## 2019-01-23 DIAGNOSIS — R5383 Other fatigue: Secondary | ICD-10-CM | POA: Diagnosis not present

## 2019-01-23 DIAGNOSIS — R748 Abnormal levels of other serum enzymes: Secondary | ICD-10-CM | POA: Diagnosis not present

## 2019-02-07 ENCOUNTER — Ambulatory Visit (INDEPENDENT_AMBULATORY_CARE_PROVIDER_SITE_OTHER): Payer: Medicare Other | Admitting: Psychology

## 2019-02-07 DIAGNOSIS — F4310 Post-traumatic stress disorder, unspecified: Secondary | ICD-10-CM

## 2019-02-08 DIAGNOSIS — Z23 Encounter for immunization: Secondary | ICD-10-CM | POA: Diagnosis not present

## 2019-02-12 ENCOUNTER — Ambulatory Visit (INDEPENDENT_AMBULATORY_CARE_PROVIDER_SITE_OTHER): Payer: Medicare Other | Admitting: Psychology

## 2019-02-12 DIAGNOSIS — F4310 Post-traumatic stress disorder, unspecified: Secondary | ICD-10-CM | POA: Diagnosis not present

## 2019-02-15 DIAGNOSIS — Z23 Encounter for immunization: Secondary | ICD-10-CM | POA: Diagnosis not present

## 2019-02-19 ENCOUNTER — Ambulatory Visit (INDEPENDENT_AMBULATORY_CARE_PROVIDER_SITE_OTHER): Payer: Medicare Other | Admitting: Psychology

## 2019-02-19 DIAGNOSIS — F4310 Post-traumatic stress disorder, unspecified: Secondary | ICD-10-CM

## 2019-02-21 DIAGNOSIS — R748 Abnormal levels of other serum enzymes: Secondary | ICD-10-CM | POA: Diagnosis not present

## 2019-02-26 ENCOUNTER — Ambulatory Visit (INDEPENDENT_AMBULATORY_CARE_PROVIDER_SITE_OTHER): Payer: Medicare Other | Admitting: Psychology

## 2019-02-26 DIAGNOSIS — F4310 Post-traumatic stress disorder, unspecified: Secondary | ICD-10-CM | POA: Diagnosis not present

## 2019-03-05 ENCOUNTER — Ambulatory Visit: Payer: Medicare Other | Admitting: Psychology

## 2019-03-05 DIAGNOSIS — G4700 Insomnia, unspecified: Secondary | ICD-10-CM | POA: Diagnosis not present

## 2019-03-05 DIAGNOSIS — R5382 Chronic fatigue, unspecified: Secondary | ICD-10-CM | POA: Diagnosis not present

## 2019-03-12 ENCOUNTER — Ambulatory Visit (INDEPENDENT_AMBULATORY_CARE_PROVIDER_SITE_OTHER): Payer: Medicare Other | Admitting: Psychology

## 2019-03-12 DIAGNOSIS — F4310 Post-traumatic stress disorder, unspecified: Secondary | ICD-10-CM

## 2019-03-15 DIAGNOSIS — F5101 Primary insomnia: Secondary | ICD-10-CM | POA: Diagnosis not present

## 2019-03-15 DIAGNOSIS — F447 Conversion disorder with mixed symptom presentation: Secondary | ICD-10-CM | POA: Diagnosis not present

## 2019-03-15 DIAGNOSIS — F4312 Post-traumatic stress disorder, chronic: Secondary | ICD-10-CM | POA: Diagnosis not present

## 2019-03-15 DIAGNOSIS — R5382 Chronic fatigue, unspecified: Secondary | ICD-10-CM | POA: Diagnosis not present

## 2019-03-15 DIAGNOSIS — G4700 Insomnia, unspecified: Secondary | ICD-10-CM | POA: Diagnosis not present

## 2019-03-15 DIAGNOSIS — F411 Generalized anxiety disorder: Secondary | ICD-10-CM | POA: Diagnosis not present

## 2019-03-20 ENCOUNTER — Ambulatory Visit (INDEPENDENT_AMBULATORY_CARE_PROVIDER_SITE_OTHER): Payer: Medicare Other | Admitting: Psychology

## 2019-03-20 DIAGNOSIS — F4310 Post-traumatic stress disorder, unspecified: Secondary | ICD-10-CM | POA: Diagnosis not present

## 2019-03-29 ENCOUNTER — Ambulatory Visit (INDEPENDENT_AMBULATORY_CARE_PROVIDER_SITE_OTHER): Payer: Medicare Other | Admitting: Psychology

## 2019-03-29 DIAGNOSIS — F4310 Post-traumatic stress disorder, unspecified: Secondary | ICD-10-CM

## 2019-04-02 ENCOUNTER — Ambulatory Visit (INDEPENDENT_AMBULATORY_CARE_PROVIDER_SITE_OTHER): Payer: Medicare Other | Admitting: Psychology

## 2019-04-02 DIAGNOSIS — F4310 Post-traumatic stress disorder, unspecified: Secondary | ICD-10-CM | POA: Diagnosis not present

## 2019-04-09 ENCOUNTER — Ambulatory Visit (INDEPENDENT_AMBULATORY_CARE_PROVIDER_SITE_OTHER): Payer: Medicare Other | Admitting: Psychology

## 2019-04-09 DIAGNOSIS — F4310 Post-traumatic stress disorder, unspecified: Secondary | ICD-10-CM | POA: Diagnosis not present

## 2019-04-18 ENCOUNTER — Ambulatory Visit (INDEPENDENT_AMBULATORY_CARE_PROVIDER_SITE_OTHER): Payer: Medicare Other | Admitting: Psychology

## 2019-04-18 DIAGNOSIS — F4310 Post-traumatic stress disorder, unspecified: Secondary | ICD-10-CM

## 2019-04-25 ENCOUNTER — Ambulatory Visit (INDEPENDENT_AMBULATORY_CARE_PROVIDER_SITE_OTHER): Payer: Medicare Other | Admitting: Psychology

## 2019-04-25 DIAGNOSIS — F4310 Post-traumatic stress disorder, unspecified: Secondary | ICD-10-CM | POA: Diagnosis not present

## 2019-05-02 ENCOUNTER — Ambulatory Visit (INDEPENDENT_AMBULATORY_CARE_PROVIDER_SITE_OTHER): Payer: Medicare Other | Admitting: Psychology

## 2019-05-02 DIAGNOSIS — F4310 Post-traumatic stress disorder, unspecified: Secondary | ICD-10-CM | POA: Diagnosis not present

## 2019-05-06 DIAGNOSIS — R4189 Other symptoms and signs involving cognitive functions and awareness: Secondary | ICD-10-CM | POA: Diagnosis not present

## 2019-05-06 DIAGNOSIS — G4700 Insomnia, unspecified: Secondary | ICD-10-CM | POA: Diagnosis not present

## 2019-05-06 DIAGNOSIS — R5382 Chronic fatigue, unspecified: Secondary | ICD-10-CM | POA: Diagnosis not present

## 2019-05-06 DIAGNOSIS — R7989 Other specified abnormal findings of blood chemistry: Secondary | ICD-10-CM | POA: Diagnosis not present

## 2019-05-09 ENCOUNTER — Ambulatory Visit (INDEPENDENT_AMBULATORY_CARE_PROVIDER_SITE_OTHER): Payer: Medicare Other | Admitting: Psychology

## 2019-05-09 DIAGNOSIS — F4310 Post-traumatic stress disorder, unspecified: Secondary | ICD-10-CM | POA: Diagnosis not present

## 2019-05-16 ENCOUNTER — Ambulatory Visit (INDEPENDENT_AMBULATORY_CARE_PROVIDER_SITE_OTHER): Payer: Medicare Other | Admitting: Psychology

## 2019-05-16 DIAGNOSIS — F4310 Post-traumatic stress disorder, unspecified: Secondary | ICD-10-CM | POA: Diagnosis not present

## 2019-05-23 ENCOUNTER — Ambulatory Visit (INDEPENDENT_AMBULATORY_CARE_PROVIDER_SITE_OTHER): Payer: Medicare Other | Admitting: Psychology

## 2019-05-23 DIAGNOSIS — F4310 Post-traumatic stress disorder, unspecified: Secondary | ICD-10-CM

## 2019-05-30 ENCOUNTER — Ambulatory Visit (INDEPENDENT_AMBULATORY_CARE_PROVIDER_SITE_OTHER): Payer: Medicare Other | Admitting: Psychology

## 2019-05-30 DIAGNOSIS — F4310 Post-traumatic stress disorder, unspecified: Secondary | ICD-10-CM | POA: Diagnosis not present

## 2019-06-06 ENCOUNTER — Ambulatory Visit (INDEPENDENT_AMBULATORY_CARE_PROVIDER_SITE_OTHER): Payer: Medicare Other | Admitting: Psychology

## 2019-06-06 DIAGNOSIS — F4310 Post-traumatic stress disorder, unspecified: Secondary | ICD-10-CM

## 2019-06-11 DIAGNOSIS — Z09 Encounter for follow-up examination after completed treatment for conditions other than malignant neoplasm: Secondary | ICD-10-CM | POA: Diagnosis not present

## 2019-06-11 DIAGNOSIS — R5383 Other fatigue: Secondary | ICD-10-CM | POA: Diagnosis not present

## 2019-06-11 DIAGNOSIS — D680 Von Willebrand's disease: Secondary | ICD-10-CM | POA: Diagnosis not present

## 2019-06-13 ENCOUNTER — Telehealth: Payer: Self-pay | Admitting: General Practice

## 2019-06-13 ENCOUNTER — Ambulatory Visit (INDEPENDENT_AMBULATORY_CARE_PROVIDER_SITE_OTHER): Payer: Medicare Other | Admitting: Psychology

## 2019-06-13 DIAGNOSIS — F4310 Post-traumatic stress disorder, unspecified: Secondary | ICD-10-CM

## 2019-06-13 NOTE — Telephone Encounter (Signed)
Patient states that she was recommended to [facility] by Bambi Cottle @[facility]  [place]. Patient is requesting to establish care with Dr . Please Advise

## 2019-06-14 NOTE — Telephone Encounter (Signed)
Please advise 

## 2019-06-14 NOTE — Telephone Encounter (Signed)
I am willing to take her as a patient but warn her we do not have an opening for a few months.

## 2019-06-15 DIAGNOSIS — F4312 Post-traumatic stress disorder, chronic: Secondary | ICD-10-CM | POA: Diagnosis not present

## 2019-06-15 DIAGNOSIS — F5101 Primary insomnia: Secondary | ICD-10-CM | POA: Diagnosis not present

## 2019-06-15 DIAGNOSIS — F447 Conversion disorder with mixed symptom presentation: Secondary | ICD-10-CM | POA: Diagnosis not present

## 2019-06-15 DIAGNOSIS — F411 Generalized anxiety disorder: Secondary | ICD-10-CM | POA: Diagnosis not present

## 2019-06-19 NOTE — Telephone Encounter (Signed)
Patient has been scheduled

## 2019-06-20 ENCOUNTER — Ambulatory Visit (INDEPENDENT_AMBULATORY_CARE_PROVIDER_SITE_OTHER): Payer: Medicare Other | Admitting: Psychology

## 2019-06-20 DIAGNOSIS — F4310 Post-traumatic stress disorder, unspecified: Secondary | ICD-10-CM | POA: Diagnosis not present

## 2019-06-27 ENCOUNTER — Ambulatory Visit (INDEPENDENT_AMBULATORY_CARE_PROVIDER_SITE_OTHER): Payer: Medicare Other | Admitting: Psychology

## 2019-06-27 DIAGNOSIS — F4310 Post-traumatic stress disorder, unspecified: Secondary | ICD-10-CM | POA: Diagnosis not present

## 2019-07-04 ENCOUNTER — Ambulatory Visit (INDEPENDENT_AMBULATORY_CARE_PROVIDER_SITE_OTHER): Payer: Medicare Other | Admitting: Psychology

## 2019-07-04 DIAGNOSIS — F4310 Post-traumatic stress disorder, unspecified: Secondary | ICD-10-CM

## 2019-07-08 DIAGNOSIS — R5383 Other fatigue: Secondary | ICD-10-CM | POA: Diagnosis not present

## 2019-07-08 DIAGNOSIS — E2740 Unspecified adrenocortical insufficiency: Secondary | ICD-10-CM | POA: Diagnosis not present

## 2019-07-08 DIAGNOSIS — R531 Weakness: Secondary | ICD-10-CM | POA: Diagnosis not present

## 2019-07-10 DIAGNOSIS — R5383 Other fatigue: Secondary | ICD-10-CM | POA: Diagnosis not present

## 2019-07-10 DIAGNOSIS — R531 Weakness: Secondary | ICD-10-CM | POA: Diagnosis not present

## 2019-07-11 ENCOUNTER — Ambulatory Visit (INDEPENDENT_AMBULATORY_CARE_PROVIDER_SITE_OTHER): Payer: Medicare Other | Admitting: Psychology

## 2019-07-11 DIAGNOSIS — F4310 Post-traumatic stress disorder, unspecified: Secondary | ICD-10-CM | POA: Diagnosis not present

## 2019-07-18 ENCOUNTER — Ambulatory Visit (INDEPENDENT_AMBULATORY_CARE_PROVIDER_SITE_OTHER): Payer: Medicare Other | Admitting: Psychology

## 2019-07-18 DIAGNOSIS — F4310 Post-traumatic stress disorder, unspecified: Secondary | ICD-10-CM | POA: Diagnosis not present

## 2019-07-25 ENCOUNTER — Ambulatory Visit: Payer: Medicare Other | Admitting: Psychology

## 2019-08-01 ENCOUNTER — Ambulatory Visit (INDEPENDENT_AMBULATORY_CARE_PROVIDER_SITE_OTHER): Payer: Medicare Other | Admitting: Psychology

## 2019-08-01 DIAGNOSIS — F4310 Post-traumatic stress disorder, unspecified: Secondary | ICD-10-CM

## 2019-08-08 ENCOUNTER — Ambulatory Visit (INDEPENDENT_AMBULATORY_CARE_PROVIDER_SITE_OTHER): Payer: Medicare Other | Admitting: Psychology

## 2019-08-08 DIAGNOSIS — F4310 Post-traumatic stress disorder, unspecified: Secondary | ICD-10-CM

## 2019-08-15 ENCOUNTER — Ambulatory Visit (INDEPENDENT_AMBULATORY_CARE_PROVIDER_SITE_OTHER): Payer: Medicare Other | Admitting: Psychology

## 2019-08-15 DIAGNOSIS — F4310 Post-traumatic stress disorder, unspecified: Secondary | ICD-10-CM

## 2019-08-22 ENCOUNTER — Ambulatory Visit (INDEPENDENT_AMBULATORY_CARE_PROVIDER_SITE_OTHER): Payer: Medicare Other | Admitting: Psychology

## 2019-08-22 DIAGNOSIS — F4310 Post-traumatic stress disorder, unspecified: Secondary | ICD-10-CM | POA: Diagnosis not present

## 2019-08-29 ENCOUNTER — Ambulatory Visit (INDEPENDENT_AMBULATORY_CARE_PROVIDER_SITE_OTHER): Payer: Medicare Other | Admitting: Psychology

## 2019-08-29 DIAGNOSIS — F4310 Post-traumatic stress disorder, unspecified: Secondary | ICD-10-CM | POA: Diagnosis not present

## 2019-09-05 ENCOUNTER — Ambulatory Visit (INDEPENDENT_AMBULATORY_CARE_PROVIDER_SITE_OTHER): Payer: Medicare Other | Admitting: Psychology

## 2019-09-05 DIAGNOSIS — F4310 Post-traumatic stress disorder, unspecified: Secondary | ICD-10-CM

## 2019-09-12 ENCOUNTER — Other Ambulatory Visit: Payer: Self-pay

## 2019-09-12 ENCOUNTER — Encounter: Payer: Self-pay | Admitting: Family Medicine

## 2019-09-12 ENCOUNTER — Ambulatory Visit (INDEPENDENT_AMBULATORY_CARE_PROVIDER_SITE_OTHER): Payer: Medicare Other | Admitting: Family Medicine

## 2019-09-12 ENCOUNTER — Ambulatory Visit (INDEPENDENT_AMBULATORY_CARE_PROVIDER_SITE_OTHER): Payer: Medicare Other | Admitting: Psychology

## 2019-09-12 VITALS — BP 100/60 | HR 93 | Temp 97.9°F | Resp 12 | Ht 66.0 in | Wt 125.0 lb

## 2019-09-12 DIAGNOSIS — D72819 Decreased white blood cell count, unspecified: Secondary | ICD-10-CM

## 2019-09-12 DIAGNOSIS — Z8619 Personal history of other infectious and parasitic diseases: Secondary | ICD-10-CM

## 2019-09-12 DIAGNOSIS — Z87898 Personal history of other specified conditions: Secondary | ICD-10-CM

## 2019-09-12 DIAGNOSIS — R05 Cough: Secondary | ICD-10-CM | POA: Diagnosis not present

## 2019-09-12 DIAGNOSIS — D6800 Von Willebrand disease, unspecified: Secondary | ICD-10-CM

## 2019-09-12 DIAGNOSIS — F4310 Post-traumatic stress disorder, unspecified: Secondary | ICD-10-CM

## 2019-09-12 DIAGNOSIS — M797 Fibromyalgia: Secondary | ICD-10-CM | POA: Diagnosis not present

## 2019-09-12 DIAGNOSIS — J40 Bronchitis, not specified as acute or chronic: Secondary | ICD-10-CM

## 2019-09-12 DIAGNOSIS — R002 Palpitations: Secondary | ICD-10-CM

## 2019-09-12 DIAGNOSIS — R058 Other specified cough: Secondary | ICD-10-CM

## 2019-09-12 DIAGNOSIS — G4700 Insomnia, unspecified: Secondary | ICD-10-CM | POA: Diagnosis not present

## 2019-09-12 LAB — COMPREHENSIVE METABOLIC PANEL
ALT: 15 U/L (ref 0–35)
AST: 23 U/L (ref 0–37)
Albumin: 4.4 g/dL (ref 3.5–5.2)
Alkaline Phosphatase: 54 U/L (ref 39–117)
BUN: 14 mg/dL (ref 6–23)
CO2: 25 mEq/L (ref 19–32)
Calcium: 9.5 mg/dL (ref 8.4–10.5)
Chloride: 105 mEq/L (ref 96–112)
Creatinine, Ser: 0.93 mg/dL (ref 0.40–1.20)
GFR: 59.13 mL/min — ABNORMAL LOW (ref >60.00)
Glucose, Bld: 86 mg/dL (ref 70–99)
Potassium: 4.3 mEq/L (ref 3.5–5.1)
Sodium: 139 mEq/L (ref 135–145)
Total Bilirubin: 0.4 mg/dL (ref 0.2–1.2)
Total Protein: 6.5 g/dL (ref 6.0–8.3)

## 2019-09-12 LAB — CBC
HCT: 37.7 % (ref 36.0–46.0)
Hemoglobin: 12.9 g/dL (ref 12.0–15.0)
MCHC: 34.3 g/dL (ref 30.0–36.0)
MCV: 94.2 fl (ref 78.0–100.0)
Platelets: 249 10*3/uL (ref 150.0–400.0)
RBC: 4 Mil/uL (ref 3.87–5.11)
RDW: 13.3 % (ref 11.5–15.5)
WBC: 3.5 10*3/uL — ABNORMAL LOW (ref 4.0–10.5)

## 2019-09-12 LAB — TSH: TSH: 2.23 u[IU]/mL (ref 0.35–4.50)

## 2019-09-12 NOTE — Patient Instructions (Signed)
Omron Blood Pressure cuff, upper arm, want BP 100-140/60-90 Pulse oximeter, want oxygen in 90s  Weekly vitals  Take Multivitamin with minerals, selenium Vitamin D 1000-2000 IU daily Probiotic with lactobacillus and bifidophilus Asprin EC 81 mg daily  Melatonin 2-5 mg at bedtime

## 2019-09-13 ENCOUNTER — Encounter: Payer: Self-pay | Admitting: Family Medicine

## 2019-09-13 ENCOUNTER — Telehealth: Payer: Self-pay

## 2019-09-13 LAB — SARS COV-2 SEROLOGY(COVID-19)AB(IGG,IGM),IMMUNOASSAY
SARS CoV-2 AB IgG: NEGATIVE
SARS CoV-2 IgM: NEGATIVE

## 2019-09-13 NOTE — Telephone Encounter (Signed)
Patient called in to see if Dr. Abner Greenspan or the nurse could give her a call back to discuss her test results. Please call the patient at [phone]

## 2019-09-13 NOTE — Telephone Encounter (Signed)
Dawn Simmons had send her a message back and may get her a little confused.  But she still did not do what I asked which was send [place] all her questions she had.  On of her concerns was this?  She thinks that she had covid about a year and a half ago.  The antibodies test was negative and she was confused that it was negative. She wanted to know how long does antibodies last?

## 2019-09-13 NOTE — Telephone Encounter (Signed)
Notified patient of her results.   FYI   Was on the phone for almost 20 min, with her going on about if she should get the shot or not.  She thinks that she had covid about a year and a half ago.  The antibodies test was negative and she was confused that it was negative. She wanted to know how long does antibodies last. She stated that she will try and send a mychart message as I advised her that she should speak with provider at next visit.  She had too many questions that were out of my scope to answer.

## 2019-09-15 ENCOUNTER — Encounter: Payer: Self-pay | Admitting: Family Medicine

## 2019-09-15 DIAGNOSIS — Z8619 Personal history of other infectious and parasitic diseases: Secondary | ICD-10-CM

## 2019-09-15 DIAGNOSIS — Z87898 Personal history of other specified conditions: Secondary | ICD-10-CM | POA: Insufficient documentation

## 2019-09-15 DIAGNOSIS — D72819 Decreased white blood cell count, unspecified: Secondary | ICD-10-CM

## 2019-09-15 DIAGNOSIS — F4310 Post-traumatic stress disorder, unspecified: Secondary | ICD-10-CM | POA: Insufficient documentation

## 2019-09-15 DIAGNOSIS — D6800 Von Willebrand disease, unspecified: Secondary | ICD-10-CM | POA: Insufficient documentation

## 2019-09-15 HISTORY — DX: Personal history of other infectious and parasitic diseases: Z86.19

## 2019-09-15 HISTORY — DX: Post-traumatic stress disorder, unspecified: F43.10

## 2019-09-15 HISTORY — DX: Decreased white blood cell count, unspecified: D72.819

## 2019-09-15 NOTE — Telephone Encounter (Signed)
She should get the shot which is what I told her at her visit. I had them set her up with a quick return visit because I knew she would have many questions. Am willing to do a virtual visit maybe on Friday if she wants to discuss.

## 2019-09-15 NOTE — Assessment & Plan Note (Signed)
Patient with childhood trama and numerous stressors. Follows with [facility] Bambi Cottle. She previously was responding to EMDR but she has been unable to obtain it during the pandemic. She will continue with [facility]

## 2019-09-15 NOTE — Assessment & Plan Note (Signed)
Uses Diazepam with good results, will continue for now

## 2019-09-15 NOTE — Assessment & Plan Note (Signed)
Follows with Clinton Gallant of [facility], Integrative Medicine department. They have told her she has adrenal insufficiency but she has hadon significant treatment.

## 2019-09-15 NOTE — Assessment & Plan Note (Signed)
Mild, will monitor 

## 2019-09-15 NOTE — Assessment & Plan Note (Signed)
Much better but did have a bad URI back in end of November 2019 she believes she had COVID. Antibody testing was negative but leaves her diagnosis inconclusive. She had a work up for COPD which was negative.

## 2019-09-15 NOTE — Progress Notes (Signed)
Subjective:    Patient ID: Dawn Simmons, female    DOB: Jun 21, 1945, 74 y.o.   MRN: [id number]  Chief Complaint  Patient presents with  . Establish Care  . chronic fatigue    HPI Patient is in today for new patient appointment. No recent febrile illness or hospitalization. She is accompanied by her husband. She has been referred by [facility], Olena Heckle. Patient reports she has always felt immune compromised and she reports she got all childhood diseases. In late 2019 she had a very bad respiratory illness she believes was COVID. She has not been the same since. She has struggled with chronic cough and she had a work up with pulmonology which was negative for COPD. She has the diagnosis PTSD and suffered trauma as a child and has ongoing stressors. She follows with Dr Gwyneth Sprout of psychiatry and they have prescribed Diazepam to help with her insomnia. Denies CP/palp/SOB/HA/congestion/fevers/GI or GU c/o. Taking meds as prescribed  Past Medical History:  Diagnosis Date  . H/O fibromyalgia   . H/O insomnia   . H/O syncope   . History of chicken pox 09/15/2019  . History of palpitations   . Mitral valve prolapse   . Palpitations   . Syncope   . Von Willebrand disease (HCC)     Past Surgical History:  Procedure Laterality Date  . NO PAST SURGERIES      Family History  Problem Relation Age of Onset  . Stroke Mother   . Emphysema Father        smoked  . Heart attack Maternal Grandfather   . Heart attack Paternal Grandfather   . Heart attack Maternal Uncle   . Heart failure Maternal Grandmother   . Heart failure Paternal Grandmother   . Seizures Neg Hx     Social History   Socioeconomic History  . Marital status: Married    Spouse name: tony  . Number of children: 4  . Years of education: 29  . Highest education level: Not on file  Occupational History  . Not on file  Tobacco Use  . Smoking status: Former Smoker    Packs/day: 0.50    Years: 10.00   Pack years: 5.00    Quit date: 04/27/1972    Years since quitting: 47.4  . Smokeless tobacco: Never Used  Substance and Sexual Activity  . Alcohol use: Yes    Alcohol/week: 0.0 standard drinks    Comment: occ 1 glass wine or beer  . Drug use: No  . Sexual activity: Not on file  Other Topics Concern  . Not on file  Social History Narrative   Lives at home with husband   Caffeine use- tea, 1 cup/day   Social Determinants of Health   Financial Resource Strain:   . Difficulty of Paying Living Expenses:   Food Insecurity:   . Worried About Programme researcher, broadcasting/film/video in the Last Year:   . Barista in the Last Year:   Transportation Needs:   . Freight forwarder (Medical):   Marland Kitchen Lack of Transportation (Non-Medical):   Physical Activity:   . Days of Exercise per Week:   . Minutes of Exercise per Session:   Stress:   . Feeling of Stress :   Social Connections:   . Frequency of Communication with Friends and Family:   . Frequency of Social Gatherings with Friends and Family:   . Attends Religious Services:   . Active Member of Clubs or  Organizations:   . Attends Archivist Meetings:   Marland Kitchen Marital Status:   Intimate Partner Violence:   . Fear of Current or Ex-Partner:   . Emotionally Abused:   Marland Kitchen Physically Abused:   . Sexually Abused:     Outpatient Medications Prior to Visit  Medication Sig Dispense Refill  . Ascorbic Acid (VITAMIN C) 100 MG tablet Take 100 mg by mouth daily.    . Calcium-Magnesium 200-100 MG TABS Take 4 tablets by mouth daily. 2 tablets in the morning and 2 tablets in the evening    . diazepam (VALIUM) 5 MG tablet Take 5 mg by mouth every 6 (six) hours as needed for anxiety or muscle spasms. TAKE 1/2 TO 1 TABLET AT BED TIME as needed    . ergocalciferol (DRISDOL) 200 MCG/ML drops Take by mouth daily. PURE brand    . FLUoxetine (PROZAC) 10 MG capsule Take 2 capsules (20 mg total) by mouth every morning.    Marland Kitchen MAGNESIUM PO Take 200 mg by mouth at  bedtime.    . Multiple Vitamin (MULTIVITAMIN) capsule Take 1 capsule by mouth daily.    Marland Kitchen FLUoxetine (PROZAC) 10 MG capsule Take 30 mg by mouth daily.     No facility-administered medications prior to visit.    Allergies  Allergen Reactions  . Doxycycline Swelling  . Tetanus Toxoids Other (See Comments)    PAIN IN NECK AND FACE  . Flexeril [Cyclobenzaprine] Other (See Comments)    Review of Systems  Constitutional: Positive for malaise/fatigue. Negative for chills and fever.  HENT: Negative for congestion and hearing loss.   Eyes: Negative for discharge.  Respiratory: Negative for cough, sputum production and shortness of breath.   Cardiovascular: Negative for chest pain, palpitations and leg swelling.  Gastrointestinal: Negative for abdominal pain, blood in stool, constipation, diarrhea, heartburn, nausea and vomiting.  Genitourinary: Negative for dysuria, frequency, hematuria and urgency.  Musculoskeletal: Positive for myalgias. Negative for back pain and falls.  Skin: Negative for rash.  Neurological: Negative for dizziness, sensory change, loss of consciousness, weakness and headaches.  Endo/Heme/Allergies: Negative for environmental allergies. Does not bruise/bleed easily.  Psychiatric/Behavioral: Positive for depression. Negative for suicidal ideas. The patient is nervous/anxious and has insomnia.        Objective:    Physical Exam Constitutional:      General: She is not in acute distress.    Appearance: She is well-developed.  HENT:     Head: Normocephalic and atraumatic.  Eyes:     Conjunctiva/sclera: Conjunctivae normal.  Neck:     Thyroid: No thyromegaly.  Cardiovascular:     Rate and Rhythm: Normal rate and regular rhythm.     Heart sounds: Normal heart sounds. No murmur.  Pulmonary:     Effort: Pulmonary effort is normal. No respiratory distress.     Breath sounds: Normal breath sounds.  Abdominal:     General: Bowel sounds are normal. There is no  distension.     Palpations: Abdomen is soft. There is no mass.     Tenderness: There is no abdominal tenderness.  Musculoskeletal:     Cervical back: Neck supple.  Lymphadenopathy:     Cervical: No cervical adenopathy.  Skin:    General: Skin is warm and dry.  Neurological:     Mental Status: She is alert and oriented to person, place, and time.  Psychiatric:        Behavior: Behavior normal.     BP 100/60 (BP Location: Left Arm,  Cuff Size: Normal)   Pulse 93   Temp 97.9 F (36.6 C) (Temporal)   Resp 12   Ht 5\' 6"  (1.676 m)   Wt 125 lb (56.7 kg)   SpO2 98%   BMI 20.18 kg/m  Wt Readings from Last 3 Encounters:  09/12/19 125 lb (56.7 kg)  09/06/18 110 lb 3.2 oz (50 kg)  07/06/15 120 lb 9.6 oz (54.7 kg)    Diabetic Foot Exam - Simple   No data filed     Lab Results  Component Value Date   WBC 3.5 (L) 09/12/2019   HGB 12.9 09/12/2019   HCT 37.7 09/12/2019   PLT 249.0 09/12/2019   GLUCOSE 86 09/12/2019   ALT 15 09/12/2019   AST 23 09/12/2019   NA 139 09/12/2019   K 4.3 09/12/2019   CL 105 09/12/2019   CREATININE 0.93 09/12/2019   BUN 14 09/12/2019   CO2 25 09/12/2019   TSH 2.23 09/12/2019    Lab Results  Component Value Date   TSH 2.23 09/12/2019   Lab Results  Component Value Date   WBC 3.5 (L) 09/12/2019   HGB 12.9 09/12/2019   HCT 37.7 09/12/2019   MCV 94.2 09/12/2019   PLT 249.0 09/12/2019   Lab Results  Component Value Date   NA 139 09/12/2019   K 4.3 09/12/2019   CO2 25 09/12/2019   GLUCOSE 86 09/12/2019   BUN 14 09/12/2019   CREATININE 0.93 09/12/2019   BILITOT 0.4 09/12/2019   ALKPHOS 54 09/12/2019   AST 23 09/12/2019   ALT 15 09/12/2019   PROT 6.5 09/12/2019   ALBUMIN 4.4 09/12/2019   CALCIUM 9.5 09/12/2019   GFR 59.13 (L) 09/12/2019   No results found for: CHOL No results found for: HDL No results found for: LDLCALC No results found for: TRIG No results found for: CHOLHDL No results found for: 11/12/2019     Assessment &  Plan:   Problem List Items Addressed This Visit    H/O syncope    No recent episodes.       Palpitations - Primary   Relevant Orders   CBC (Completed)   Comprehensive metabolic panel (Completed)   TSH (Completed)   Fibromyalgia    Follows with [id number] of [facility], Integrative Medicine department. They have told her she has adrenal insufficiency but she has hadon significant treatment.       Relevant Medications   FLUoxetine (PROZAC) 10 MG capsule   Insomnia    Uses Diazepam with good results, will continue for now      Upper airway cough syndrome    Much better but did have a bad URI back in end of November 2019 she believes she had COVID. Antibody testing was negative but leaves her diagnosis inconclusive. She had a work up for COPD which was negative.       Von Willebrand disease (HCC)   RESOLVED: H/O insomnia   Leukopenia    Mild, will monitor      PTSD (post-traumatic stress disorder)    Patient with childhood trama and numerous stressors. Follows with [facility] Bambi Cottle. She previously was responding to EMDR but she has been unable to obtain it during the pandemic. She will continue with [facility]      Relevant Medications   FLUoxetine (PROZAC) 10 MG capsule   History of chicken pox    Other Visit Diagnoses    Bronchitis       Relevant Orders   SARS  CoV2 Serology(COVID19) AB(IgG,IgM),Immunoassay (Completed)      I have changed Zeola Emily's FLUoxetine. I am also having her maintain her multivitamin, diazepam, Calcium-Magnesium, vitamin C, ergocalciferol, and MAGNESIUM PO.  No orders of the defined types were placed in this encounter.    Danise Edge, MD

## 2019-09-15 NOTE — Assessment & Plan Note (Signed)
No recent episodes

## 2019-09-16 ENCOUNTER — Encounter: Payer: Self-pay | Admitting: Family Medicine

## 2019-09-16 NOTE — Telephone Encounter (Signed)
See mychart message.  She is scheduled for first covid vaccine.

## 2019-09-19 ENCOUNTER — Ambulatory Visit (INDEPENDENT_AMBULATORY_CARE_PROVIDER_SITE_OTHER): Payer: Medicare Other | Admitting: Psychology

## 2019-09-19 DIAGNOSIS — F4310 Post-traumatic stress disorder, unspecified: Secondary | ICD-10-CM | POA: Diagnosis not present

## 2019-09-22 ENCOUNTER — Encounter: Payer: Self-pay | Admitting: Family Medicine

## 2019-09-26 ENCOUNTER — Ambulatory Visit (INDEPENDENT_AMBULATORY_CARE_PROVIDER_SITE_OTHER): Payer: Medicare Other | Admitting: Psychology

## 2019-09-26 DIAGNOSIS — F4310 Post-traumatic stress disorder, unspecified: Secondary | ICD-10-CM | POA: Diagnosis not present

## 2019-10-03 ENCOUNTER — Ambulatory Visit (INDEPENDENT_AMBULATORY_CARE_PROVIDER_SITE_OTHER): Payer: Medicare Other | Admitting: Psychology

## 2019-10-03 DIAGNOSIS — F4310 Post-traumatic stress disorder, unspecified: Secondary | ICD-10-CM

## 2019-10-06 ENCOUNTER — Encounter: Payer: Self-pay | Admitting: Family Medicine

## 2019-10-10 ENCOUNTER — Ambulatory Visit (INDEPENDENT_AMBULATORY_CARE_PROVIDER_SITE_OTHER): Payer: Medicare Other | Admitting: Psychology

## 2019-10-10 DIAGNOSIS — F4310 Post-traumatic stress disorder, unspecified: Secondary | ICD-10-CM

## 2019-10-17 ENCOUNTER — Ambulatory Visit (INDEPENDENT_AMBULATORY_CARE_PROVIDER_SITE_OTHER): Payer: Medicare Other | Admitting: Psychology

## 2019-10-17 DIAGNOSIS — F4310 Post-traumatic stress disorder, unspecified: Secondary | ICD-10-CM

## 2019-10-24 ENCOUNTER — Ambulatory Visit (INDEPENDENT_AMBULATORY_CARE_PROVIDER_SITE_OTHER): Payer: Medicare Other | Admitting: Psychology

## 2019-10-24 DIAGNOSIS — F4310 Post-traumatic stress disorder, unspecified: Secondary | ICD-10-CM | POA: Diagnosis not present

## 2019-10-28 DIAGNOSIS — F5101 Primary insomnia: Secondary | ICD-10-CM | POA: Diagnosis not present

## 2019-10-28 DIAGNOSIS — F411 Generalized anxiety disorder: Secondary | ICD-10-CM | POA: Diagnosis not present

## 2019-10-28 DIAGNOSIS — F447 Conversion disorder with mixed symptom presentation: Secondary | ICD-10-CM | POA: Diagnosis not present

## 2019-10-28 DIAGNOSIS — F4312 Post-traumatic stress disorder, chronic: Secondary | ICD-10-CM | POA: Diagnosis not present

## 2019-10-31 ENCOUNTER — Ambulatory Visit (INDEPENDENT_AMBULATORY_CARE_PROVIDER_SITE_OTHER): Payer: Medicare Other | Admitting: Psychology

## 2019-10-31 ENCOUNTER — Other Ambulatory Visit: Payer: Self-pay

## 2019-10-31 ENCOUNTER — Telehealth: Payer: Self-pay | Admitting: *Deleted

## 2019-10-31 ENCOUNTER — Encounter: Payer: Self-pay | Admitting: Family Medicine

## 2019-10-31 ENCOUNTER — Ambulatory Visit (INDEPENDENT_AMBULATORY_CARE_PROVIDER_SITE_OTHER): Payer: Medicare Other | Admitting: Family Medicine

## 2019-10-31 DIAGNOSIS — F4310 Post-traumatic stress disorder, unspecified: Secondary | ICD-10-CM | POA: Diagnosis not present

## 2019-10-31 DIAGNOSIS — M25512 Pain in left shoulder: Secondary | ICD-10-CM | POA: Insufficient documentation

## 2019-10-31 DIAGNOSIS — G4700 Insomnia, unspecified: Secondary | ICD-10-CM

## 2019-10-31 DIAGNOSIS — G8929 Other chronic pain: Secondary | ICD-10-CM

## 2019-10-31 DIAGNOSIS — J040 Acute laryngitis: Secondary | ICD-10-CM | POA: Insufficient documentation

## 2019-10-31 DIAGNOSIS — Z8619 Personal history of other infectious and parasitic diseases: Secondary | ICD-10-CM

## 2019-10-31 DIAGNOSIS — D72819 Decreased white blood cell count, unspecified: Secondary | ICD-10-CM

## 2019-10-31 HISTORY — DX: Pain in left shoulder: M25.512

## 2019-10-31 NOTE — Assessment & Plan Note (Signed)
Is following with psychiatry and [facility] Bambi Cottle this has been very helpful

## 2019-10-31 NOTE — Assessment & Plan Note (Addendum)
Very mild on CBC, re[eat cbc today.

## 2019-10-31 NOTE — Assessment & Plan Note (Addendum)
Occurs randomly. Worse since COVID in 05/2017, mild

## 2019-10-31 NOTE — Telephone Encounter (Signed)
Cologuard order sent

## 2019-10-31 NOTE — Patient Instructions (Addendum)
Shingrix is the new shingles shot 2 shots 2-6 months apart. Get at the pharmacy.   Ginger caps prior to meds  Magnesium Glycinate 200-400 mg at bedtime plus Melatonin 5 mg at bedtime Insomnia Insomnia is a sleep disorder that makes it difficult to fall asleep or stay asleep. Insomnia can cause fatigue, low energy, difficulty concentrating, mood swings, and poor performance at work or school. There are three different ways to classify insomnia:  Difficulty falling asleep.  Difficulty staying asleep.  Waking up too early in the morning. Any type of insomnia can be long-term (chronic) or short-term (acute). Both are common. Short-term insomnia usually lasts for three months or less. Chronic insomnia occurs at least three times a week for longer than three months. What are the causes? Insomnia may be caused by another condition, situation, or substance, such as:  Anxiety.  Certain medicines.  Gastroesophageal reflux disease (GERD) or other gastrointestinal conditions.  Asthma or other breathing conditions.  Restless legs syndrome, sleep apnea, or other sleep disorders.  Chronic pain.  Menopause.  Stroke.  Abuse of alcohol, tobacco, or illegal drugs.  Mental health conditions, such as depression.  Caffeine.  Neurological disorders, such as Alzheimer's disease.  An overactive thyroid (hyperthyroidism). Sometimes, the cause of insomnia may not be known. What increases the risk? Risk factors for insomnia include:  Gender. Women are affected more often than men.  Age. Insomnia is more common as you get older.  Stress.  Lack of exercise.  Irregular work schedule or working night shifts.  Traveling between different time zones.  Certain medical and mental health conditions. What are the signs or symptoms? If you have insomnia, the main symptom is having trouble falling asleep or having trouble staying asleep. This may lead to other symptoms, such as:  Feeling  fatigued or having low energy.  Feeling nervous about going to sleep.  Not feeling rested in the morning.  Having trouble concentrating.  Feeling irritable, anxious, or depressed. How is this diagnosed? This condition may be diagnosed based on:  Your symptoms and medical history. Your health care provider may ask about: ? Your sleep habits. ? Any medical conditions you have. ? Your mental health.  A physical exam. How is this treated? Treatment for insomnia depends on the cause. Treatment may focus on treating an underlying condition that is causing insomnia. Treatment may also include:  Medicines to help you sleep.  Counseling or therapy.  Lifestyle adjustments to help you sleep better. Follow these instructions at home: Eating and drinking   Limit or avoid alcohol, caffeinated beverages, and cigarettes, especially close to bedtime. These can disrupt your sleep.  Do not eat a large meal or eat spicy foods right before bedtime. This can lead to digestive discomfort that can make it hard for you to sleep. Sleep habits   Keep a sleep diary to help you and your health care provider figure out what could be causing your insomnia. Write down: ? When you sleep. ? When you wake up during the night. ? How well you sleep. ? How rested you feel the next day. ? Any side effects of medicines you are taking. ? What you eat and drink.  Make your bedroom a dark, comfortable place where it is easy to fall asleep. ? Put up shades or blackout curtains to block light from outside. ? Use a white noise machine to block noise. ? Keep the temperature cool.  Limit screen use before bedtime. This includes: ? Watching TV. ?  Using your smartphone, tablet, or computer.  Stick to a routine that includes going to bed and waking up at the same times every day and night. This can help you fall asleep faster. Consider making a quiet activity, such as reading, part of your nighttime  routine.  Try to avoid taking naps during the day so that you sleep better at night.  Get out of bed if you are still awake after 15 minutes of trying to sleep. Keep the lights down, but try reading or doing a quiet activity. When you feel sleepy, go back to bed. General instructions  Take over-the-counter and prescription medicines only as told by your health care provider.  Exercise regularly, as told by your health care provider. Avoid exercise starting several hours before bedtime.  Use relaxation techniques to manage stress. Ask your health care provider to suggest some techniques that may work well for you. These may include: ? Breathing exercises. ? Routines to release muscle tension. ? Visualizing peaceful scenes.  Make sure that you drive carefully. Avoid driving if you feel very sleepy.  Keep all follow-up visits as told by your health care provider. This is important. Contact a health care provider if:  You are tired throughout the day.  You have trouble in your daily routine due to sleepiness.  You continue to have sleep problems, or your sleep problems get worse. Get help right away if:  You have serious thoughts about hurting yourself or someone else. If you ever feel like you may hurt yourself or others, or have thoughts about taking your own life, get help right away. You can go to your nearest emergency department or call:  Your local emergency services (911 in the [place]  A suicide crisis helpline, such as the National Suicide Prevention Lifeline at 770 395 7217. This is open 24 hours a day. Summary  Insomnia is a sleep disorder that makes it difficult to fall asleep or stay asleep.  Insomnia can be long-term (chronic) or short-term (acute).  Treatment for insomnia depends on the cause. Treatment may focus on treating an underlying condition that is causing insomnia.  Keep a sleep diary to help you and your health care provider figure out what could be  causing your insomnia. This information is not intended to replace advice given to you by your health care provider. Make sure you discuss any questions you have with your health care provider. Document Revised: 03/10/2017 Document Reviewed: 01/05/2017 Elsevier Patient Education  2020 [employer].

## 2019-10-31 NOTE — Assessment & Plan Note (Signed)
Has been bothering her for 1.5 years and is improving now.

## 2019-11-01 ENCOUNTER — Encounter: Payer: Self-pay | Admitting: Family Medicine

## 2019-11-01 LAB — CBC WITH DIFFERENTIAL/PLATELET
Basophils Absolute: 0 10*3/uL (ref 0.0–0.1)
Basophils Relative: 0.9 % (ref 0.0–3.0)
Eosinophils Absolute: 0.1 10*3/uL (ref 0.0–0.7)
Eosinophils Relative: 1.7 % (ref 0.0–5.0)
HCT: 37.7 % (ref 36.0–46.0)
Hemoglobin: 12.7 g/dL (ref 12.0–15.0)
Lymphocytes Relative: 33.5 % (ref 12.0–46.0)
Lymphs Abs: 1.4 10*3/uL (ref 0.7–4.0)
MCHC: 33.8 g/dL (ref 30.0–36.0)
MCV: 93.9 fl (ref 78.0–100.0)
Monocytes Absolute: 0.5 10*3/uL (ref 0.1–1.0)
Monocytes Relative: 10.6 % (ref 3.0–12.0)
Neutro Abs: 2.3 10*3/uL (ref 1.4–7.7)
Neutrophils Relative %: 53.3 % (ref 43.0–77.0)
Platelets: 280 10*3/uL (ref 150.0–400.0)
RBC: 4.01 Mil/uL (ref 3.87–5.11)
RDW: 13.3 % (ref 11.5–15.5)
WBC: 4.3 10*3/uL (ref 4.0–10.5)

## 2019-11-01 NOTE — Assessment & Plan Note (Signed)
Encouraged good sleep hygiene such as dark, quiet room. No blue/green glowing lights such as computer screens in bedroom. No alcohol or stimulants in evening. Cut down on caffeine as able. Regular exercise is helpful but not just prior to bed time. Magnesium Glycinate 400 mg prn

## 2019-11-01 NOTE — Telephone Encounter (Signed)
Dawn Simmons, I put her on for that date for you. I did not want it to get taken. Will you make sure its scheduled correctly ?

## 2019-11-01 NOTE — Progress Notes (Signed)
Subjective:    Patient ID: Dawn Simmons, female    DOB: [birth year], 74 y.o.   MRN: [id number]  Chief Complaint  Patient presents with  . Follow-up    HPI Patient is in today for follow up on chronic medical concerns. She is accompanied by her husband. No recent febrile illness or hospitalizations. Overall she is doing well. Insomnia can occur but is infrequent. Denies CP/palp/SOB/HA/congestion/fevers/GI or GU c/o. Taking meds as prescribed. Her left shoulder bothers her at times. She has taken her COVID shots.   Past Medical History:  Diagnosis Date  . H/O fibromyalgia   . H/O insomnia   . H/O measles   . H/O rubella   . H/O syncope   . History of chicken pox 09/15/2019  . History of palpitations   . Hx of mumps   . Mitral valve prolapse   . Palpitations   . Syncope   . Von Willebrand disease (HCC)     Past Surgical History:  Procedure Laterality Date  . NO PAST SURGERIES      Family History  Problem Relation Age of Onset  . Stroke Mother   . Emphysema Father        smoked  . Heart attack Maternal Grandfather   . Heart attack Paternal Grandfather   . Heart attack Maternal Uncle   . Heart failure Maternal Grandmother   . Heart failure Paternal Grandmother   . Seizures Neg Hx     Social History   Socioeconomic History  . Marital status: Married    Spouse name: tony  . Number of children: 4  . Years of education: 81  . Highest education level: Not on file  Occupational History  . Not on file  Tobacco Use  . Smoking status: Former Smoker    Packs/day: 0.50    Years: 10.00    Pack years: 5.00    Quit date: 04/27/1972    Years since quitting: 47.5  . Smokeless tobacco: Never Used  Vaping Use  . Vaping Use: Never used  Substance and Sexual Activity  . Alcohol use: Yes    Alcohol/week: 0.0 standard drinks    Comment: occ 1 glass wine or beer  . Drug use: No  . Sexual activity: Not on file  Other Topics Concern  . Not on file  Social History Narrative     Lives at home with husband   Caffeine use- tea, 1 cup/day   Social Determinants of Health   Financial Resource Strain:   . Difficulty of Paying Living Expenses:   Food Insecurity:   . Worried About Programme researcher, broadcasting/film/video in the Last Year:   . Barista in the Last Year:   Transportation Needs:   . Freight forwarder (Medical):   Marland Kitchen Lack of Transportation (Non-Medical):   Physical Activity:   . Days of Exercise per Week:   . Minutes of Exercise per Session:   Stress:   . Feeling of Stress :   Social Connections:   . Frequency of Communication with Friends and Family:   . Frequency of Social Gatherings with Friends and Family:   . Attends Religious Services:   . Active Member of Clubs or Organizations:   . Attends Banker Meetings:   Marland Kitchen Marital Status:   Intimate Partner Violence:   . Fear of Current or Ex-Partner:   . Emotionally Abused:   Marland Kitchen Physically Abused:   . Sexually Abused:     Outpatient  Medications Prior to Visit  Medication Sig Dispense Refill  . Ascorbic Acid (VITAMIN C) 100 MG tablet Take 100 mg by mouth daily.    . Calcium-Magnesium 200-100 MG TABS Take 4 tablets by mouth daily. 2 tablets in the morning and 2 tablets in the evening    . diazepam (VALIUM) 5 MG tablet Take 5 mg by mouth every 6 (six) hours as needed for anxiety or muscle spasms. TAKE 1/2 TO 1 TABLET AT BED TIME as needed    . ergocalciferol (DRISDOL) 200 MCG/ML drops Take by mouth daily. PURE brand    . FLUoxetine (PROZAC) 10 MG capsule Take 2 capsules (20 mg total) by mouth every morning.    Marland Kitchen MAGNESIUM PO Take 200 mg by mouth at bedtime.    . Multiple Vitamin (MULTIVITAMIN) capsule Take 1 capsule by mouth daily.     No facility-administered medications prior to visit.    Allergies  Allergen Reactions  . Doxycycline Swelling  . Tetanus Toxoids Other (See Comments)    PAIN IN NECK AND FACE  . Flexeril [Cyclobenzaprine] Other (See Comments)    Review of Systems   Constitutional: Negative for fever and malaise/fatigue.  HENT: Positive for sore throat. Negative for congestion.   Eyes: Negative for blurred vision.  Respiratory: Negative for shortness of breath.   Cardiovascular: Negative for chest pain, palpitations and leg swelling.  Gastrointestinal: Negative for abdominal pain, blood in stool and nausea.  Genitourinary: Negative for dysuria and frequency.  Musculoskeletal: Positive for joint pain. Negative for falls.  Skin: Negative for rash.  Neurological: Negative for dizziness, loss of consciousness and headaches.  Endo/Heme/Allergies: Negative for environmental allergies.  Psychiatric/Behavioral: Positive for depression. The patient is nervous/anxious.        Objective:    Physical Exam Vitals and nursing note reviewed.  Constitutional:      General: She is not in acute distress.    Appearance: She is well-developed.  HENT:     Head: Normocephalic and atraumatic.     Nose: Nose normal.  Eyes:     General:        Right eye: No discharge.        Left eye: No discharge.  Cardiovascular:     Rate and Rhythm: Normal rate and regular rhythm.     Heart sounds: No murmur heard.   Pulmonary:     Effort: Pulmonary effort is normal.     Breath sounds: Normal breath sounds.  Abdominal:     General: Bowel sounds are normal.     Palpations: Abdomen is soft.     Tenderness: There is no abdominal tenderness.  Musculoskeletal:     Cervical back: Normal range of motion and neck supple.  Skin:    General: Skin is warm and dry.  Neurological:     Mental Status: She is alert and oriented to person, place, and time.     BP (!) 100/62 (BP Location: Left Arm, Cuff Size: Normal)   Pulse 79   Temp 97.7 F (36.5 C) (Oral)   Resp 12   Ht 5\' 6"  (1.676 m)   Wt 126 lb 3.2 oz (57.2 kg)   SpO2 98%   BMI 20.37 kg/m  Wt Readings from Last 3 Encounters:  10/31/19 126 lb 3.2 oz (57.2 kg)  09/12/19 125 lb (56.7 kg)  09/06/18 110 lb 3.2 oz (50  kg)    Diabetic Foot Exam - Simple   No data filed     Lab Results  Component Value Date   WBC 3.5 (L) 09/12/2019   HGB 12.9 09/12/2019   HCT 37.7 09/12/2019   PLT 249.0 09/12/2019   GLUCOSE 86 09/12/2019   ALT 15 09/12/2019   AST 23 09/12/2019   NA 139 09/12/2019   K 4.3 09/12/2019   CL 105 09/12/2019   CREATININE 0.93 09/12/2019   BUN 14 09/12/2019   CO2 25 09/12/2019   TSH 2.23 09/12/2019    Lab Results  Component Value Date   TSH 2.23 09/12/2019   Lab Results  Component Value Date   WBC 3.5 (L) 09/12/2019   HGB 12.9 09/12/2019   HCT 37.7 09/12/2019   MCV 94.2 09/12/2019   PLT 249.0 09/12/2019   Lab Results  Component Value Date   NA 139 09/12/2019   K 4.3 09/12/2019   CO2 25 09/12/2019   GLUCOSE 86 09/12/2019   BUN 14 09/12/2019   CREATININE 0.93 09/12/2019   BILITOT 0.4 09/12/2019   ALKPHOS 54 09/12/2019   AST 23 09/12/2019   ALT 15 09/12/2019   PROT 6.5 09/12/2019   ALBUMIN 4.4 09/12/2019   CALCIUM 9.5 09/12/2019   GFR 59.13 (L) 09/12/2019   No results found for: CHOL No results found for: HDL No results found for: LDLCALC No results found for: TRIG No results found for: CHOLHDL No results found for: ASTM1D     Assessment & Plan:   Problem List Items Addressed This Visit    Insomnia    Encouraged good sleep hygiene such as dark, quiet room. No blue/green glowing lights such as computer screens in bedroom. No alcohol or stimulants in evening. Cut down on caffeine as able. Regular exercise is helpful but not just prior to bed time. Magnesium Glycinate 400 mg prn      Leukopenia    Very mild on CBC, re[eat cbc today.      Relevant Orders   CBC w/Diff   PTSD (post-traumatic stress disorder)    Is following with psychiatry and [facility] Bambi Cottle this has been very helpful      History of chicken pox   Left shoulder pain    Has been bothering her for 1.5 years and is improving now.      Laryngitis    Occurs randomly. Worse since  COVID in 05/2017, mild         I am having Dawn Simmons maintain her multivitamin, diazepam, Calcium-Magnesium, vitamin C, ergocalciferol, MAGNESIUM PO, and FLUoxetine.  No orders of the defined types were placed in this encounter.    Danise Edge, MD

## 2019-11-07 ENCOUNTER — Telehealth: Payer: Self-pay | Admitting: Family Medicine

## 2019-11-07 ENCOUNTER — Ambulatory Visit: Payer: Medicare Other | Admitting: Psychology

## 2019-11-07 DIAGNOSIS — G8929 Other chronic pain: Secondary | ICD-10-CM

## 2019-11-07 NOTE — Telephone Encounter (Signed)
New message:   Pt states she has some things going on with her and she would like to discuss them with the nurse. Pt would not give any other information. Please advise.

## 2019-11-08 NOTE — Telephone Encounter (Signed)
Patient called about a few things:  Stated that she has gone thru menopause before covid.  She did not have to wear deoderant, but she did anyway, but now she has too and she now has hair growing on her legs, and lastly having hot flashes.  Also complains of fatigue.  She wants to know how weird is this and is it normal. She has been having some bruising.  Has Von Willebrand disease. 3.   She would like to know who you recommend for her left shoulder.  She can barely move it and put clothes on.

## 2019-11-08 NOTE — Telephone Encounter (Signed)
For shoulder would recommend [facility], Dr Katrinka Blazing or Dr Denyse Amass. The bruising is usually just the thinning of the skin with aging. Her platelets were normal on last blood check and no other bleeding is noted. The hair is normal. Not sure about the deodarant except she might have picked up a new bacteria that causes odor would recommend cleaning axillae with Jeanann Lewandowsky Astringent after each day. That should help after a time if it is bacteria.

## 2019-11-10 DIAGNOSIS — Z1211 Encounter for screening for malignant neoplasm of colon: Secondary | ICD-10-CM | POA: Diagnosis not present

## 2019-11-10 LAB — COLOGUARD: Cologuard: NEGATIVE

## 2019-11-11 NOTE — Telephone Encounter (Signed)
Returning your call. °

## 2019-11-11 NOTE — Telephone Encounter (Signed)
Patient notified and referral placed for shoulder.

## 2019-11-11 NOTE — Telephone Encounter (Signed)
Left message with husband to call back.

## 2019-11-14 ENCOUNTER — Ambulatory Visit (INDEPENDENT_AMBULATORY_CARE_PROVIDER_SITE_OTHER): Payer: Medicare Other | Admitting: Psychology

## 2019-11-14 DIAGNOSIS — F4310 Post-traumatic stress disorder, unspecified: Secondary | ICD-10-CM

## 2019-11-15 LAB — EXTERNAL GENERIC LAB PROCEDURE: COLOGUARD: NEGATIVE

## 2019-11-15 LAB — COLOGUARD: COLOGUARD: NEGATIVE

## 2019-11-18 ENCOUNTER — Ambulatory Visit (INDEPENDENT_AMBULATORY_CARE_PROVIDER_SITE_OTHER): Payer: Medicare Other

## 2019-11-18 ENCOUNTER — Ambulatory Visit: Payer: Self-pay

## 2019-11-18 ENCOUNTER — Encounter: Payer: Self-pay | Admitting: Family Medicine

## 2019-11-18 ENCOUNTER — Other Ambulatory Visit: Payer: Self-pay

## 2019-11-18 ENCOUNTER — Ambulatory Visit (INDEPENDENT_AMBULATORY_CARE_PROVIDER_SITE_OTHER): Payer: Medicare Other | Admitting: Family Medicine

## 2019-11-18 VITALS — BP 102/60 | HR 88 | Ht 66.0 in | Wt 125.8 lb

## 2019-11-18 DIAGNOSIS — G8929 Other chronic pain: Secondary | ICD-10-CM | POA: Diagnosis not present

## 2019-11-18 DIAGNOSIS — M7062 Trochanteric bursitis, left hip: Secondary | ICD-10-CM | POA: Diagnosis not present

## 2019-11-18 DIAGNOSIS — M25512 Pain in left shoulder: Secondary | ICD-10-CM

## 2019-11-18 DIAGNOSIS — M7061 Trochanteric bursitis, right hip: Secondary | ICD-10-CM | POA: Diagnosis not present

## 2019-11-18 NOTE — Progress Notes (Signed)
Subjective:    CC: L shoulder pain  I, Dawn Simmons, LAT, ATC, am serving as scribe for Dr. [name].  HPI: Pt is a 74 y/o female presenting w/ c/o chronic L shoulder pain.  She locates her pain to her entire L shoulder and reports having pain ever since she thinks that she got Covid in 2019. Additionally she notes some lateral hip pain as well. She notes that she has been much less active than usual recently and has had worsening pain.  In general she wants to avoid medications if possible.  Radiating pain: yes into her L upper arm L shoulder mechanical symptoms: no Aggravating factors: attempts at L shoulder AROM above 90 deg; functional IR Treatments tried: vitamins; Arnica gel;   Pertinent review of Systems: No fevers or chills  Relevant historical information: PTSD. Significant medical problem requiring EMRD and medication.   Objective:    Vitals:   11/18/19 1323  BP: 102/60  Pulse: 88  SpO2: 98%   General: Well Developed, well nourished, and in no acute distress.   MSK: C-spine normal-appearing nontender midline normal motion. Tender palpation left trapezius. Left shoulder normal-appearing Range of motion: Abduction 120 degrees. External rotation 20 degrees beyond neutral position. Internal rotation lumbar spine. Strength 4/5 abduction external and internal rotation. Negative Hawkins and Neer's test. Negative empty can test.  Negative Yergason's and speeds test.  Right shoulder normal-appearing normal motion intact strength negative impingement and biceps tendinitis testing.  Left hip normal-appearing normal motion. Tender palpation greater trochanter. Hip abduction and external rotation strength diminished 4/5  Lab and Radiology Results X-ray images left shoulder obtained today personally independently reviewed No severe DJD glenohumeral joint. Mild AC DJD. Decreased bone mineral density. Await formal radiology review  Diagnostic Limited MSK  Ultrasound of: Left shoulder Biceps tendon intact normal-appearing Subscapularis tendon intact. Supraspinatus tendon is intact. Bursitis tendon is intact. AC joint mild effusion. Impression: No significant rotator cuff tear.    Impression and Recommendations:    Assessment and Plan: 74 y.o. female with persistent left shoulder pain ongoing for about 2 years. Patient states that her shoulder pain started after a systemic inflammatory or infectious episode that she attributes to COVID-19. It is possible she did have a very early case of COVID-19 or certainly some other infectious agent. Regardless she has had ongoing shoulder pain now for quite some time. 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 Discussed options. Plan for physical therapy. Patient declined offered steroid injection today.  Additionally patient has left lateral hip pain consistent with hip abductor tendinopathy/trochanteric bursitis. Significant weakness is a factor here as well.. Plan for PT.  Recheck 6 weeks.   Orders Placed This Encounter  Procedures  . [place] LIMITED JOINT SPACE STRUCTURES UP LEFT(NO LINKED CHARGES)    Order Specific Question:   Reason for Exam (SYMPTOM  OR DIAGNOSIS REQUIRED)    Answer:   L shoulder pain    Order Specific Question:   Preferred imaging location?    Answer:   [facility]  . DG Shoulder Left    Standing Status:   Future    Number of Occurrences:   1    Standing Expiration Date:   11/17/2020    Order Specific Question:   Reason for Exam (SYMPTOM  OR DIAGNOSIS REQUIRED)    Answer:   eval left shoulder    Order Specific Question:   Preferred imaging location?    Answer:   [facility]  Order Specific Question:   Radiology Contrast Protocol - do NOT remove file path    Answer:   [email or web address]  . Ambulatory referral to Physical Therapy    Referral Priority:   Routine    Referral Type:   Physical  Medicine    Referral Reason:   Specialty Services Required    Requested Specialty:   Physical Therapy   No orders of the defined types were placed in this encounter.   Discussed warning signs or symptoms. Please see discharge instructions. Patient expresses understanding.   The above documentation has been reviewed and is accurate and complete [name], M.D.

## 2019-11-18 NOTE — Patient Instructions (Addendum)
Thank you for coming in today. I think this may be frozen shoulder.  I will get an xray today to help evaluate for arthritis.  Continue current oral and topical treatment.  Recheck after about 1 month of PT.  Let me know if you have trouble getting an appointment with PT or you dont like them or other issues.    Adhesive Capsulitis  Adhesive capsulitis, also called frozen shoulder, causes the shoulder to become stiff and painful to move. This condition happens when there is inflammation of the tendons and ligaments that surround the shoulder joint (shoulder capsule). What are the causes? This condition may be caused by:  An injury to your shoulder joint.  Straining your shoulder.  Not moving your shoulder for a period of time. This can happen if your arm was injured or in a sling.  Long-standing conditions, such as: ? Diabetes. ? Thyroid problems. ? Heart disease. ? Stroke. ? Rheumatoid arthritis. ? Lung disease. In some cases, the cause is not known. What increases the risk? You are more likely to develop this condition if you are:  A woman.  Older than 73 years of age. What are the signs or symptoms? Symptoms of this condition include:  Pain in your shoulder when you move your arm. There may also be pain when parts of your shoulder are touched. The pain may be worse at night or when you are resting.  A sore or aching shoulder.  The inability to move your shoulder normally.  Muscle spasms. How is this diagnosed? This condition is diagnosed with a physical exam and imaging tests, such as an X-ray or MRI. How is this treated? This condition may be treated with:  Treatment of the underlying cause or condition.  Medicine. Medicine may be given to relieve pain, inflammation, or muscle spasms.  Steroid injections into the shoulder joint.  Physical therapy. This involves performing exercises to get the shoulder moving again.  Acupuncture. This is a type of treatment  that involves stimulating specific points on your body by inserting thin needles through your skin.  Shoulder manipulation. This is a procedure to move the shoulder into another position. It is done after you are given a medicine to make you fall asleep (general anesthetic). The joint may also be injected with salt water at high pressure to break down scarring.  Surgery. This may be done in severe cases when other treatments have failed. Although most people recover completely from adhesive capsulitis, some may not regain full shoulder movement. Follow these instructions at home: Managing pain, stiffness, and swelling      If directed, put ice on the injured area: ? Put ice in a plastic bag. ? Place a towel between your skin and the bag. ? Leave the ice on for 20 minutes, 2-3 times per day.  If directed, apply heat to the affected area before you exercise. Use the heat source that your health care provider recommends, such as a moist heat pack or a heating pad. ? Place a towel between your skin and the heat source. ? Leave the heat on for 20-30 minutes. ? Remove the heat if your skin turns bright red. This is especially important if you are unable to feel pain, heat, or cold. You may have a greater risk of getting burned. General instructions  Take over-the-counter and prescription medicines only as told by your health care provider.  If you are being treated with physical therapy, follow instructions from your physical therapist.  Avoid exercises that put a lot of demand on your shoulder, such as throwing. These exercises can make pain worse.  Keep all follow-up visits as told by your health care provider. This is important. Contact a health care provider if:  You develop new symptoms.  Your symptoms get worse. Summary  Adhesive capsulitis, also called frozen shoulder, causes the shoulder to become stiff and painful to move.  You are more likely to have this condition if you  are a woman and over age 60.  It is treated with physical therapy, medicines, and sometimes surgery. This information is not intended to replace advice given to you by your health care provider. Make sure you discuss any questions you have with your health care provider. Document Revised: 09/01/2017 Document Reviewed: 09/01/2017 Elsevier Patient Education  2020 [employer]    Hip Bursitis  Hip bursitis is swelling of a fluid-filled sac (bursa) in your hip joint. This swelling (inflammation) can be painful. This condition may come and go over time. What are the causes?  Injury to the hip.  Overuse of the muscles that surround the hip joint.  An earlier injury or surgery of the hip.  Arthritis or gout.  Diabetes.  Thyroid disease.  Infection.  In some cases, the cause may not be known. What are the signs or symptoms?  Mild or moderate pain in the hip area. Pain may get worse with movement.  Tenderness and swelling of the hip, especially on the outer side of the hip.  In rare cases, the bursa may become infected. This may cause: ? A fever. ? Warmth and redness in the area. Symptoms may come and go. How is this treated? This condition is treated by resting, icing, applying pressure (compression), and raising (elevating) the injured area. You may hear this called the RICE treatment. Treatment may also include:  Using crutches.  Draining fluid out of the bursa to help relieve swelling.  Giving a shot of (injecting) medicine that helps to reduce swelling (cortisone).  Other medicines if the bursa is infected. Follow these instructions at home: Managing pain, stiffness, and swelling   If told, put ice on the painful area. ? Put ice in a plastic bag. ? Place a towel between your skin and the bag. ? Leave the ice on for 20 minutes, 2-3 times a day. ? Raise (elevate) your hip above the level of your heart as much as you can without pain. To do this, try putting a  pillow under your hips while you lie down. Stop if this causes pain. Activity  Return to your normal activities as told by your doctor. Ask your doctor what activities are safe for you.  Rest and protect your hip as much as you can until you feel better. General instructions  Take over-the-counter and prescription medicines only as told by your doctor.  Wear wraps that put pressure on your hip (compression wraps) only as told by your doctor.  Do not use your hip to support your body weight until your doctor says that you can.  Use crutches as told by your doctor.  Gently rub and stretch your injured area as often as is comfortable.  Keep all follow-up visits as told by your doctor. This is important. How is this prevented?  Exercise regularly, as told by your doctor.  Warm up and stretch before being active.  Cool down and stretch after being active.  Avoid activities that bother your hip or cause pain.  Avoid sitting  down for long periods at a time. Contact a doctor if:  You have a fever.  You get new symptoms.  You have trouble walking.  You have trouble doing everyday activities.  You have pain that gets worse.  You have pain that does not get better with medicine.  You get red skin on your hip area.  You get a feeling of warmth in your hip area. Get help right away if:  You cannot move your hip.  You have very bad pain. Summary  Hip bursitis is swelling of a fluid-filled sac (bursa) in your hip.  Hip bursitis can be painful.  Symptoms often come and go over time.  This condition is treated with rest, ice, compression, elevation, and medicines. This information is not intended to replace advice given to you by your health care provider. Make sure you discuss any questions you have with your health care provider. Document Revised: 12/04/2017 Document Reviewed: 12/04/2017 Elsevier Patient Education  2020 [employer].

## 2019-11-19 NOTE — Progress Notes (Signed)
Left shoulder looks normal to radiology.

## 2019-11-21 ENCOUNTER — Ambulatory Visit (INDEPENDENT_AMBULATORY_CARE_PROVIDER_SITE_OTHER): Payer: Medicare Other | Admitting: Psychology

## 2019-11-21 DIAGNOSIS — F4310 Post-traumatic stress disorder, unspecified: Secondary | ICD-10-CM

## 2019-11-25 DIAGNOSIS — F411 Generalized anxiety disorder: Secondary | ICD-10-CM | POA: Diagnosis not present

## 2019-11-25 DIAGNOSIS — F5101 Primary insomnia: Secondary | ICD-10-CM | POA: Diagnosis not present

## 2019-11-25 DIAGNOSIS — F447 Conversion disorder with mixed symptom presentation: Secondary | ICD-10-CM | POA: Diagnosis not present

## 2019-11-25 DIAGNOSIS — F4312 Post-traumatic stress disorder, chronic: Secondary | ICD-10-CM | POA: Diagnosis not present

## 2019-11-26 ENCOUNTER — Encounter: Payer: Self-pay | Admitting: Rehabilitative and Restorative Service Providers"

## 2019-11-26 ENCOUNTER — Ambulatory Visit (INDEPENDENT_AMBULATORY_CARE_PROVIDER_SITE_OTHER): Payer: Medicare Other | Admitting: Rehabilitative and Restorative Service Providers"

## 2019-11-26 ENCOUNTER — Other Ambulatory Visit: Payer: Self-pay

## 2019-11-26 DIAGNOSIS — M7542 Impingement syndrome of left shoulder: Secondary | ICD-10-CM

## 2019-11-26 DIAGNOSIS — M25512 Pain in left shoulder: Secondary | ICD-10-CM

## 2019-11-26 DIAGNOSIS — G8929 Other chronic pain: Secondary | ICD-10-CM

## 2019-11-26 DIAGNOSIS — M25612 Stiffness of left shoulder, not elsewhere classified: Secondary | ICD-10-CM | POA: Diagnosis not present

## 2019-11-26 NOTE — Therapy (Signed)
[facility] Physical Therapy [address] [place], [place], [address] Phone: [phone]   Fax:  [phone]  Physical Therapy Evaluation  Patient Details  Name: Dawn Simmons MRN: [id number] Date of Birth: 02/15/1946 Referring Provider (PT): Rodolph Bong MD   Encounter Date: 11/26/2019   PT End of Session - 11/26/19 1257    Visit Number 1    Number of Visits 20    PT Start Time 1100    PT Stop Time 1145    PT Time Calculation (min) 45 min    Activity Tolerance Patient tolerated treatment well;Patient limited by pain    Behavior During Therapy [facility] for tasks assessed/performed           Past Medical History:  Diagnosis Date  . H/O fibromyalgia   . H/O insomnia   . H/O measles   . H/O rubella   . H/O syncope   . History of chicken pox 09/15/2019  . History of palpitations   . Hx of mumps   . Mitral valve prolapse   . Palpitations   . Syncope   . Von Willebrand disease (HCC)     Past Surgical History:  Procedure Laterality Date  . NO PAST SURGERIES      There were no vitals filed for this visit.    Subjective Assessment - 11/26/19 1254    Subjective Dawn Simmons has a 2 year history of L shoulder pain after getting very sick.  Symptoms have not improved.    Patient is accompained by: Family member    Limitations Other (comment)   Reaching and lifting.   Patient Stated Goals Be able to use the L shoulder without pain.    Currently in Pain? Yes    Pain Score 5     Pain Location Shoulder    Pain Orientation Left    Pain Descriptors / Indicators Sharp;Constant;Aching    Pain Type Chronic pain    Pain Radiating Towards Can go to the hand.  Mostly shoulder.    Pain Onset More than a month ago    Pain Frequency Constant    Aggravating Factors  L shoulder use    Pain Relieving Factors None    Effect of Pain on Daily Activities L shoulder is fairly non-functional              [facility] PT Assessment - 11/26/19 0001      Assessment   Medical  Diagnosis Chronic L shoulder pain    Referring Provider (PT) Rodolph Bong MD    Onset Date/Surgical Date 04/11/18    Hand Dominance Right      Balance Screen   Has the patient fallen in the past 6 months No    Has the patient had a decrease in activity level because of a fear of falling?  No    Is the patient reluctant to leave their home because of a fear of falling?  No      Home Environment   Living Environment --      Cognition   Overall Cognitive Status History of cognitive impairments - at baseline      ROM / Strength   AROM / PROM / Strength AROM;Strength      AROM   Overall AROM  Deficits    AROM Assessment Site Shoulder;Hip    Right/Left Shoulder Left;Right    Right Shoulder Flexion 170 Degrees    Right Shoulder Internal Rotation 50 Degrees    Right Shoulder External Rotation 120  Degrees    Right Shoulder Horizontal  ADduction 45 Degrees    Left Shoulder Flexion 150 Degrees    Left Shoulder Internal Rotation 15 Degrees    Left Shoulder External Rotation 90 Degrees    Left Shoulder Horizontal ADduction 25 Degrees      Strength   Overall Strength Deficits    Strength Assessment Site Shoulder    Right/Left Shoulder Left;Right    Right Shoulder Internal Rotation 4+/5    Right Shoulder External Rotation 4/5    Left Shoulder Internal Rotation 4+/5    Left Shoulder External Rotation 4/5                      Objective measurements completed on examination: See above findings.       [facility] Adult PT Treatment/Exercise - 11/26/19 0001      Exercises   Exercises Shoulder      Shoulder Exercises: Supine   Protraction Strengthening;20 reps   3 seconds    Internal Rotation AAROM;20 reps   10 seconds at 45-60 degrees abduction     Shoulder Exercises: Seated   Other Seated Exercises Shoulder blade pinches 10X 5 seconds                  PT Education - 11/26/19 1257    Education Details Discussed anatomy and mechanism of impingement.  Prescribed  and reviewed an appropriate HEP.    Person(s) Educated Spouse;Patient    Methods Explanation;Demonstration;Tactile cues;Verbal cues;Handout    Comprehension Verbal cues required;Need further instruction;Returned demonstration;Verbalized understanding;Tactile cues required               PT Long Term Goals - 11/26/19 1301      PT LONG TERM GOAL #1   Title Dawn Simmons will report L shoulder pain consistently 0-3/10 on the Numeric Pain Rating Scale.    Baseline Can be 7+/10    Time 10    Period Weeks    Status New    Target Date 02/07/20      PT LONG TERM GOAL #2   Title Improve L shoulder IR to 50 and Horizontal adduction AROM to 40 degrees.    Baseline 15 and 20, respectively    Time 10    Period Weeks    Status New    Target Date 02/07/20      PT LONG TERM GOAL #3   Title Dawn Simmons will report an 80% improvement in self-reported function by DC.    Time 10    Period Weeks    Status New    Target Date 02/07/20      PT LONG TERM GOAL #4   Title Dawn Simmons will be independent with her HEP at DC.    Time 10    Period Weeks    Status New    Target Date 02/07/20                  Plan - 11/26/19 1258    Clinical Impression Statement Dawn Simmons has a very tight posterior capsule.  IR and HA AROM are very limited.  Strength is not bad.  IR AROM and posterior capsule stretching will be the focus to resore normal AROM and L shoulder function.    Examination-Activity Limitations Bathing;Dressing;Hygiene/Grooming;Carry;Reach Overhead    Examination-Participation Restrictions Interpersonal Relationship;Occupation    Stability/Clinical Decision Making Stable/Uncomplicated    Clinical Decision Making Low    Rehab Potential Good    PT Frequency 2x / week  PT Duration Other (comment)   10 weeks   PT Treatment/Interventions ADLs/Self Care Home Management;Moist Heat;Cryotherapy;Therapeutic activities;Therapeutic exercise;Neuromuscular re-education;Patient/family education;Manual  techniques;Passive range of motion;Joint Manipulations    PT Next Visit Plan Stretching for IR and horizontal adduction    PT Home Exercise Plan Access Code: [email or web address]    Consulted and Agree with Plan of Care Family member/caregiver;Patient    Family Member Consulted Husband           Patient will benefit from skilled therapeutic intervention in order to improve the following deficits and impairments:  Decreased activity tolerance, Decreased endurance, Decreased range of motion, Decreased mobility, Impaired flexibility, Impaired UE functional use, Pain  Visit Diagnosis: Stiffness of left shoulder, not elsewhere classified  Chronic left shoulder pain  Impingement syndrome of left shoulder     Problem List Patient Active Problem List   Diagnosis Date Noted  . Left shoulder pain 10/31/2019  . Laryngitis 10/31/2019  . Leukopenia 09/15/2019  . PTSD (post-traumatic stress disorder) 09/15/2019  . History of chicken pox 09/15/2019  . Von Willebrand disease (HCC)   . Upper airway cough syndrome 09/06/2018  . Fibromyalgia 06/01/2015  . Insomnia 06/01/2015  . H/O syncope   . Palpitations   . Mitral valve prolapse     Cherlyn Cushing PT, MPT 11/26/2019, 1:05 PM  [facility] Physical Therapy [address] [place], [place], [address] Phone: [phone]   Fax:  [phone]  Name: Dawn Simmons MRN: [id number] Date of Birth: December 13, 1945

## 2019-11-26 NOTE — Patient Instructions (Signed)
Access Code: [email or web address] URL: [email or web address] Date: 11/26/2019 Prepared by: Pauletta Browns  Exercises Standing Scapular Retraction - 5 x daily - 7 x weekly - 1 sets - 5 reps - 5 hold Supine Scapular Protraction in Flexion with Dumbbells - 2-3 x daily - 7 x weekly - 1 sets - 20 reps - 3 seconds hold Supine Shoulder Internal Rotation Stretch - 2-3 x daily - 7 x weekly - 1 sets - 10-20 reps - 10 secondsinutes hold

## 2019-11-28 ENCOUNTER — Ambulatory Visit (INDEPENDENT_AMBULATORY_CARE_PROVIDER_SITE_OTHER): Payer: Medicare Other | Admitting: Physical Therapy

## 2019-11-28 ENCOUNTER — Ambulatory Visit (INDEPENDENT_AMBULATORY_CARE_PROVIDER_SITE_OTHER): Payer: Medicare Other | Admitting: Psychology

## 2019-11-28 ENCOUNTER — Other Ambulatory Visit: Payer: Self-pay

## 2019-11-28 DIAGNOSIS — M7542 Impingement syndrome of left shoulder: Secondary | ICD-10-CM

## 2019-11-28 DIAGNOSIS — G8929 Other chronic pain: Secondary | ICD-10-CM | POA: Diagnosis not present

## 2019-11-28 DIAGNOSIS — F4310 Post-traumatic stress disorder, unspecified: Secondary | ICD-10-CM

## 2019-11-28 DIAGNOSIS — M25512 Pain in left shoulder: Secondary | ICD-10-CM

## 2019-11-28 DIAGNOSIS — M25612 Stiffness of left shoulder, not elsewhere classified: Secondary | ICD-10-CM | POA: Diagnosis not present

## 2019-11-28 NOTE — Therapy (Signed)
[facility] Physical Therapy [address] [place], [place], [address] Phone: [phone]   Fax:  [phone]  Physical Therapy Treatment  Patient Details  Name: Dawn Simmons MRN: [id number] Date of Birth: 28-Feb-1946 Referring Provider (PT): Rodolph Bong MD   Encounter Date: 11/28/2019   PT End of Session - 11/28/19 1542    Visit Number 2    Number of Visits 20    PT Start Time 1530    PT Stop Time 1610    PT Time Calculation (min) 40 min    Activity Tolerance Patient tolerated treatment well;Patient limited by pain    Behavior During Therapy [facility] for tasks assessed/performed           Past Medical History:  Diagnosis Date  . H/O fibromyalgia   . H/O insomnia   . H/O measles   . H/O rubella   . H/O syncope   . History of chicken pox 09/15/2019  . History of palpitations   . Hx of mumps   . Mitral valve prolapse   . Palpitations   . Syncope   . Von Willebrand disease (HCC)     Past Surgical History:  Procedure Laterality Date  . NO PAST SURGERIES      There were no vitals filed for this visit.   Subjective Assessment - 11/28/19 1535    Subjective Pt arriving to therapy reporting 1/10 pain at rest. Pt reporting doing her HEP. Pt reporting 5-6/10 pain with movements, but can increased depending on activity.    Limitations Other (comment)    Patient Stated Goals Be able to use the L shoulder without pain.    Currently in Pain? Yes    Pain Score 6     Pain Location Shoulder    Pain Orientation Left    Pain Descriptors / Indicators Aching    Pain Type Chronic pain    Pain Onset More than a month ago    Pain Frequency Constant                             [facility] Adult PT Treatment/Exercise - 11/28/19 0001      Shoulder Exercises: Supine   Protraction Strengthening;20 reps   3 seconds    Internal Rotation AAROM;20 reps   10 seconds at 45-60 degrees abduction     Shoulder Exercises: Seated   Other Seated Exercises Shoulder  blade pinches 10X 5 seconds      Shoulder Exercises: Standing   External Rotation Strengthening;Left;10 reps;Theraband    Theraband Level (Shoulder External Rotation) Level 1 (Yellow)      Shoulder Exercises: Pulleys   Flexion 2 minutes      Shoulder Exercises: Isometric Strengthening   Flexion 5X5"    Extension 5X5"    External Rotation 5X5"    Internal Rotation 5X5"      Shoulder Exercises: Stretch   Internal Rotation Stretch 5 reps    Internal Rotation Stretch Limitations holding 10 seconds, using towel behind the back    Table Stretch - Flexion 5 reps;10 seconds    Table Stretch - External Rotation 5 reps;10 seconds                       PT Long Term Goals - 11/28/19 1620      PT LONG TERM GOAL #1   Title [name] will report L shoulder pain consistently 0-3/10 on the Numeric Pain Rating Scale.  Baseline Can be 7+/10    Status On-going      PT LONG TERM GOAL #2   Title Improve L shoulder IR to 50 and Horizontal adduction AROM to 40 degrees.    Status On-going      PT LONG TERM GOAL #3   Title [name] will report an 80% improvement in self-reported function by DC.    Status On-going      PT LONG TERM GOAL #4   Title Kayelynn will be independent with her HEP at DC.    Status On-going                 Plan - 11/28/19 1551    Clinical Impression Statement Pt arriving to therapy reporting 5-6/10 pain in her left shoulder. Pt tolerating exericses well. Pt reporting soreness/ fatigue after exercises. Continue skilled PT.    Examination-Activity Limitations Bathing;Dressing;Hygiene/Grooming;Carry;Reach Overhead    Examination-Participation Restrictions Interpersonal Relationship;Occupation    Stability/Clinical Decision Making Stable/Uncomplicated    Rehab Potential Good    PT Frequency 2x / week    PT Duration Other (comment)    PT Treatment/Interventions ADLs/Self Care Home Management;Moist Heat;Cryotherapy;Therapeutic activities;Therapeutic  exercise;Neuromuscular re-education;Patient/family education;Manual techniques;Passive range of motion;Joint Manipulations    PT Next Visit Plan Stretching for IR and horizontal adduction    PT Home Exercise Plan Access Code: [email or web address]    Consulted and Agree with Plan of Care Family member/caregiver;Patient           Patient will benefit from skilled therapeutic intervention in order to improve the following deficits and impairments:  Decreased activity tolerance, Decreased endurance, Decreased range of motion, Decreased mobility, Impaired flexibility, Impaired UE functional use, Pain  Visit Diagnosis: Stiffness of left shoulder, not elsewhere classified  Chronic left shoulder pain  Impingement syndrome of left shoulder     Problem List Patient Active Problem List   Diagnosis Date Noted  . Left shoulder pain 10/31/2019  . Laryngitis 10/31/2019  . Leukopenia 09/15/2019  . PTSD (post-traumatic stress disorder) 09/15/2019  . History of chicken pox 09/15/2019  . Von Willebrand disease (HCC)   . Upper airway cough syndrome 09/06/2018  . Fibromyalgia 06/01/2015  . Insomnia 06/01/2015  . H/O syncope   . Palpitations   . Mitral valve prolapse     Sharmon Leyden , PT, MPT 11/28/2019, 4:25 PM  [facility] Physical Therapy [address] [place], [place], [address] Phone: [phone]   Fax:  [phone]  Name: Dawn Simmons MRN: [id number] Date of Birth: April 06, 1946

## 2019-12-02 ENCOUNTER — Other Ambulatory Visit: Payer: Self-pay

## 2019-12-02 ENCOUNTER — Encounter: Payer: Self-pay | Admitting: *Deleted

## 2019-12-02 ENCOUNTER — Encounter: Payer: Self-pay | Admitting: Rehabilitative and Restorative Service Providers"

## 2019-12-02 ENCOUNTER — Ambulatory Visit (INDEPENDENT_AMBULATORY_CARE_PROVIDER_SITE_OTHER): Payer: Medicare Other | Admitting: Rehabilitative and Restorative Service Providers"

## 2019-12-02 DIAGNOSIS — G8929 Other chronic pain: Secondary | ICD-10-CM | POA: Diagnosis not present

## 2019-12-02 DIAGNOSIS — M25512 Pain in left shoulder: Secondary | ICD-10-CM

## 2019-12-02 DIAGNOSIS — F411 Generalized anxiety disorder: Secondary | ICD-10-CM | POA: Diagnosis not present

## 2019-12-02 DIAGNOSIS — F5101 Primary insomnia: Secondary | ICD-10-CM | POA: Diagnosis not present

## 2019-12-02 DIAGNOSIS — F447 Conversion disorder with mixed symptom presentation: Secondary | ICD-10-CM | POA: Diagnosis not present

## 2019-12-02 DIAGNOSIS — M7542 Impingement syndrome of left shoulder: Secondary | ICD-10-CM

## 2019-12-02 DIAGNOSIS — M25612 Stiffness of left shoulder, not elsewhere classified: Secondary | ICD-10-CM

## 2019-12-02 DIAGNOSIS — F4312 Post-traumatic stress disorder, chronic: Secondary | ICD-10-CM | POA: Diagnosis not present

## 2019-12-02 NOTE — Therapy (Signed)
[facility] Physical Therapy [address] [place], [place], [address] Phone: [phone]   Fax:  [phone]  Physical Therapy Treatment  Patient Details  Name: Dawn Simmons MRN: [id number] Date of Birth: 02-Mar-1946 Referring Provider (PT): Rodolph Bong MD   Encounter Date: 12/02/2019   PT End of Session - 12/02/19 1511    Visit Number 3    Number of Visits 20    PT Start Time 1508    PT Stop Time 1548    PT Time Calculation (min) 40 min    Activity Tolerance Patient tolerated treatment well;Patient limited by pain    Behavior During Therapy [facility] for tasks assessed/performed           Past Medical History:  Diagnosis Date  . H/O fibromyalgia   . H/O insomnia   . H/O measles   . H/O rubella   . H/O syncope   . History of chicken pox 09/15/2019  . History of palpitations   . Hx of mumps   . Mitral valve prolapse   . Palpitations   . Syncope   . Von Willebrand disease (HCC)     Past Surgical History:  Procedure Laterality Date  . NO PAST SURGERIES      There were no vitals filed for this visit.                      [facility] Adult PT Treatment/Exercise - 12/02/19 0001      Shoulder Exercises: Supine   Other Supine Exercises supine wand flexion 2 x 10 1 lb      Shoulder Exercises: Seated   Flexion Both;Other (comment)   x5   Other Seated Exercises retraction 5 sec hold x 10      Shoulder Exercises: Standing   External Rotation Strengthening;Both;15 reps   red   Theraband Level (Shoulder External Rotation) Level 2 (Red)      Shoulder Exercises: ROM/Strengthening   UBE (Upper Arm Bike) lvl 2 3 mins fwd, lvl 1.5 3 mins back      Manual Therapy   Manual therapy comments g2-g3 inferior, ap Lt GH jt mobs, prom                       PT Long Term Goals - 11/28/19 1620      PT LONG TERM GOAL #1   Title Dawn Simmons will report L shoulder pain consistently 0-3/10 on the Numeric Pain Rating Scale.    Baseline Can be  7+/10    Status On-going      PT LONG TERM GOAL #2   Title Improve L shoulder IR to 50 and Horizontal adduction AROM to 40 degrees.    Status On-going      PT LONG TERM GOAL #3   Title Dawn Simmons will report an 80% improvement in self-reported function by DC.    Status On-going      PT LONG TERM GOAL #4   Title Dawn Simmons will be independent with her HEP at DC.    Status On-going                 Plan - 12/02/19 1532    Clinical Impression Statement Pt. presented c high muscle guarding to movement, both passive and active, as well as elevated pain sensitivity to movement.  Passive accessory movement analysis revealed graded improved passive mobility during course of treatment.  Recommended continued skilled PT services indicated at this time.    Examination-Activity  Limitations Bathing;Dressing;Hygiene/Grooming;Carry;Reach Overhead    Examination-Participation Restrictions Interpersonal Relationship;Occupation    Stability/Clinical Decision Making Stable/Uncomplicated    Rehab Potential Good    PT Frequency 2x / week    PT Duration Other (comment)    PT Treatment/Interventions ADLs/Self Care Home Management;Moist Heat;Cryotherapy;Therapeutic activities;Therapeutic exercise;Neuromuscular re-education;Patient/family education;Manual techniques;Passive range of motion;Joint Manipulations    PT Next Visit Plan Continue to improve activity/movement tolerance c both passive and active mobility intervention, strengthening as appropriate.    PT Home Exercise Plan Access Code: [email or web address]    Consulted and Agree with Plan of Care Family member/caregiver;Patient           Patient will benefit from skilled therapeutic intervention in order to improve the following deficits and impairments:  Decreased activity tolerance, Decreased endurance, Decreased range of motion, Decreased mobility, Impaired flexibility, Impaired UE functional use, Pain  Visit Diagnosis: Stiffness of left shoulder, not  elsewhere classified  Chronic left shoulder pain  Impingement syndrome of left shoulder     Problem List Patient Active Problem List   Diagnosis Date Noted  . Left shoulder pain 10/31/2019  . Laryngitis 10/31/2019  . Leukopenia 09/15/2019  . PTSD (post-traumatic stress disorder) 09/15/2019  . History of chicken pox 09/15/2019  . Von Willebrand disease (HCC)   . Upper airway cough syndrome 09/06/2018  . Fibromyalgia 06/01/2015  . Insomnia 06/01/2015  . H/O syncope   . Palpitations   . Mitral valve prolapse    Chyrel Masson, PT, DPT, OCS, ATC 12/02/19  3:53 PM    [facility] Physical Therapy [address] [place], [place], [address] Phone: [phone]   Fax:  [phone]  Name: Dawn Simmons MRN: [id number] Date of Birth: 10/10/1945

## 2019-12-05 ENCOUNTER — Ambulatory Visit (INDEPENDENT_AMBULATORY_CARE_PROVIDER_SITE_OTHER): Payer: Medicare Other | Admitting: Psychology

## 2019-12-05 DIAGNOSIS — F4310 Post-traumatic stress disorder, unspecified: Secondary | ICD-10-CM

## 2019-12-06 ENCOUNTER — Other Ambulatory Visit: Payer: Self-pay

## 2019-12-06 ENCOUNTER — Ambulatory Visit (INDEPENDENT_AMBULATORY_CARE_PROVIDER_SITE_OTHER): Payer: Medicare Other | Admitting: Rehabilitative and Restorative Service Providers"

## 2019-12-06 ENCOUNTER — Encounter: Payer: Self-pay | Admitting: Rehabilitative and Restorative Service Providers"

## 2019-12-06 DIAGNOSIS — M25512 Pain in left shoulder: Secondary | ICD-10-CM

## 2019-12-06 DIAGNOSIS — M7542 Impingement syndrome of left shoulder: Secondary | ICD-10-CM

## 2019-12-06 DIAGNOSIS — M25612 Stiffness of left shoulder, not elsewhere classified: Secondary | ICD-10-CM | POA: Diagnosis not present

## 2019-12-06 DIAGNOSIS — G8929 Other chronic pain: Secondary | ICD-10-CM

## 2019-12-06 NOTE — Therapy (Signed)
[facility] Physical Therapy [address] [place], [place], [address] Phone: [phone]   Fax:  [phone]  Physical Therapy Treatment  Patient Details  Name: Dawn Simmons MRN: [id number] Date of Birth: [birth year] Referring Provider (PT): Gregor Hams MD   Encounter Date: 12/06/2019   PT End of Session - 12/06/19 1426    Visit Number 4    Number of Visits 20    PT Start Time 9379    PT Stop Time 1225    PT Time Calculation (min) 40 min    Activity Tolerance Patient tolerated treatment well;Patient limited by pain    Behavior During Therapy [facility] for tasks assessed/performed           Past Medical History:  Diagnosis Date  . H/O fibromyalgia   . H/O insomnia   . H/O measles   . H/O rubella   . H/O syncope   . History of chicken pox 09/15/2019  . History of palpitations   . Hx of mumps   . Mitral valve prolapse   . Palpitations   . Syncope   . Von Willebrand disease ([place])     Past Surgical History:  Procedure Laterality Date  . NO PAST SURGERIES      There were no vitals filed for this visit.   Subjective Assessment - 12/06/19 1424    Subjective Dawn Simmons notes trouble sleeping, although AROM is improving.    Patient is accompained by: Family member    Limitations Other (comment)    Patient Stated Goals Be able to use the L shoulder without pain.    Currently in Pain? Yes    Pain Score 5     Pain Location Shoulder    Pain Orientation Left    Pain Descriptors / Indicators Aching;Tightness    Pain Type Chronic pain    Pain Radiating Towards To elbow    Pain Onset More than a month ago    Pain Frequency Constant    Aggravating Factors  Sleep and L shoulder use    Pain Relieving Factors None    Effect of Pain on Daily Activities L shoulder is effectively non-functional              [facility] PT Assessment - 12/06/19 0001      AROM   Left Shoulder Flexion 150 Degrees    Left Shoulder Internal Rotation 40 Degrees    Left Shoulder  External Rotation 95 Degrees    Left Shoulder Horizontal ADduction 40 Degrees                         [facility] Adult PT Treatment/Exercise - 12/06/19 0001      Exercises   Exercises Shoulder      Shoulder Exercises: Supine   Protraction Strengthening;20 reps   3 seconds 2#   Protraction Weight (lbs) 2#    Internal Rotation AAROM;20 reps   10 seconds at 45-60 degrees abduction     Shoulder Exercises: Seated   Other Seated Exercises Shoulder blade pinches 10X 5 seconds      Shoulder Exercises: Standing   Other Standing Exercises Thumb up the back stretch (IR) 10X 10 seconds                  PT Education - 12/06/19 1425    Education Details Reviewed HEP and added thumb up the back stretch    Person(s) Educated Patient;Spouse    Methods Explanation;Demonstration;Tactile cues;Verbal cues  Comprehension Verbalized understanding;Returned demonstration;Need further instruction;Verbal cues required               PT Long Term Goals - 12/06/19 1426      PT LONG TERM GOAL #1   Title Dawn Simmons will report L shoulder pain consistently 0-3/10 on the Numeric Pain Rating Scale.    Baseline Can be 7+/10    Status On-going      PT LONG TERM GOAL #2   Title Improve L shoulder IR to 50 and Horizontal adduction AROM to 40 degrees.    Baseline IR is 40 and HA 40    Status Partially Met      PT LONG TERM GOAL #3   Title Dawn Simmons will report an 80% improvement in self-reported function by DC.    Status On-going      PT LONG TERM GOAL #4   Title Dawn Simmons will be independent with her HEP at DC.    Status On-going                 Plan - 12/06/19 1427    Clinical Impression Statement Dawn Simmons notes improved AROM since starting PT.  The L shoulder still feels stiff and still limits sleep (keeps her awake or wakes her up).  Although improved, AROM is still very limited (particularly IR) and this is a continued focus with her HEP.  As AROM improves, sleep,  comfort and L shoulder function will improve.    Examination-Activity Limitations Bathing;Dressing;Hygiene/Grooming;Carry;Reach Overhead    Examination-Participation Restrictions Interpersonal Relationship;Occupation    Stability/Clinical Decision Making Stable/Uncomplicated    Rehab Potential Good    PT Frequency 2x / week    PT Duration Other (comment)    PT Treatment/Interventions ADLs/Self Care Home Management;Moist Heat;Cryotherapy;Therapeutic activities;Therapeutic exercise;Neuromuscular re-education;Patient/family education;Manual techniques;Passive range of motion;Joint Manipulations    PT Next Visit Plan AROM and strengthening of L shoulder    PT Home Exercise Plan Access Code: [email or web address]    Consulted and Agree with Plan of Care Family member/caregiver;Patient           Patient will benefit from skilled therapeutic intervention in order to improve the following deficits and impairments:  Decreased activity tolerance, Decreased endurance, Decreased range of motion, Decreased mobility, Impaired flexibility, Impaired UE functional use, Pain  Visit Diagnosis: Stiffness of left shoulder, not elsewhere classified  Chronic left shoulder pain  Impingement syndrome of left shoulder     Problem List Patient Active Problem List   Diagnosis Date Noted  . Left shoulder pain 10/31/2019  . Laryngitis 10/31/2019  . Leukopenia 09/15/2019  . PTSD (post-traumatic stress disorder) 09/15/2019  . History of chicken pox 09/15/2019  . Von Willebrand disease ([place])   . Upper airway cough syndrome 09/06/2018  . Fibromyalgia 06/01/2015  . Insomnia 06/01/2015  . H/O syncope   . Palpitations   . Mitral valve prolapse     Dawn Simmons PT, MPT 12/06/2019, 2:29 PM  [facility] Physical Therapy [address] [place], [place], [address] Phone: [phone]   Fax:  [phone]  Name: Dawn Simmons MRN: [id number] Date of Birth: 01-02-1946

## 2019-12-09 ENCOUNTER — Other Ambulatory Visit: Payer: Self-pay

## 2019-12-09 ENCOUNTER — Ambulatory Visit (INDEPENDENT_AMBULATORY_CARE_PROVIDER_SITE_OTHER): Payer: Medicare Other | Admitting: Rehabilitative and Restorative Service Providers"

## 2019-12-09 ENCOUNTER — Encounter: Payer: Self-pay | Admitting: Rehabilitative and Restorative Service Providers"

## 2019-12-09 DIAGNOSIS — M25612 Stiffness of left shoulder, not elsewhere classified: Secondary | ICD-10-CM

## 2019-12-09 DIAGNOSIS — M7542 Impingement syndrome of left shoulder: Secondary | ICD-10-CM

## 2019-12-09 DIAGNOSIS — M25512 Pain in left shoulder: Secondary | ICD-10-CM

## 2019-12-09 DIAGNOSIS — G8929 Other chronic pain: Secondary | ICD-10-CM

## 2019-12-09 NOTE — Therapy (Signed)
[facility] [address] [place], [place], [address] Phone: [phone]   Fax:  [phone]  Physical Therapy Treatment  Patient Details  Name: Dawn Simmons MRN: [id number] Date of Birth: Oct 07, 1945 Referring Provider (PT): Gregor Hams MD   Encounter Date: 12/09/2019   PT End of Session - 12/09/19 1759    Visit Number 5    Number of Visits 20    PT Start Time 5974    PT Stop Time 1228    PT Time Calculation (min) 43 min    Activity Tolerance Patient tolerated treatment well;Patient limited by pain    Behavior During Therapy [facility] for tasks assessed/performed           Past Medical History:  Diagnosis Date  . H/O fibromyalgia   . H/O insomnia   . H/O measles   . H/O rubella   . H/O syncope   . History of chicken pox 09/15/2019  . History of palpitations   . Hx of mumps   . Mitral valve prolapse   . Palpitations   . Syncope   . Von Willebrand disease ([place])     Past Surgical History:  Procedure Laterality Date  . NO PAST SURGERIES      There were no vitals filed for this visit.   Subjective Assessment - 12/09/19 1757    Subjective [name] notes progress over the weekend although she is sore today.    Patient is accompained by: Family member    Limitations Other (comment)    Patient Stated Goals Be able to use the L shoulder without pain.    Currently in Pain? Yes    Pain Score 5     Pain Location Shoulder    Pain Orientation Left    Pain Descriptors / Indicators Aching;Tightness    Pain Type Chronic pain    Pain Radiating Towards Upper trapezius    Pain Onset More than a month ago    Pain Frequency Constant    Aggravating Factors  Sleep and L shoulder use    Pain Relieving Factors None    Effect of Pain on Daily Activities L shoulder limited participation in ADLs (was none)              [facility] PT Assessment - 12/09/19 0001      AROM   Left Shoulder Internal Rotation 50 Degrees    Left Shoulder External  Rotation 95 Degrees    Left Shoulder Horizontal ADduction 35 Degrees                         [facility] Adult PT Treatment/Exercise - 12/09/19 0001      Exercises   Exercises Shoulder      Shoulder Exercises: Supine   Protraction Strengthening;20 reps   3 seconds 2#   Protraction Weight (lbs) 2#    Internal Rotation AAROM;20 reps   10 seconds at 45-60 degrees abduction     Shoulder Exercises: Seated   Other Seated Exercises Shoulder blade pinches 10X 5 seconds      Shoulder Exercises: Standing   External Rotation Strengthening;Left;10 reps;Theraband   3 seconds slow eccentrics Red   Theraband Level (Shoulder External Rotation) Level 2 (Red)    Internal Rotation Strengthening;Left;10 reps;Theraband   3 seconds slow eccentrics Red   Theraband Level (Shoulder Internal Rotation) Level 2 (Red)    Row Strengthening;Both;10 reps;Theraband   3 seconds slow eccentrics Red   Theraband Level (Shoulder Row) Level  2 (Red)    Other Standing Exercises SBP with hike 10X 5 seconds    Other Standing Exercises Thumb up the back stretch (IR) 10X 10 seconds                  PT Education - 12/09/19 1759    Education Details Reviewed and updated HEP (added strength activities)    Person(s) Educated Patient;Spouse    Methods Explanation;Demonstration;Tactile cues;Verbal cues    Comprehension Verbalized understanding;Returned demonstration;Need further instruction;Verbal cues required;Tactile cues required               PT Long Term Goals - 12/09/19 1759      PT LONG TERM GOAL #1   Title [place] will report L shoulder pain consistently 0-3/10 on the Numeric Pain Rating Scale.    Baseline Can be 7+/10    Status On-going      PT LONG TERM GOAL #2   Title Improve L shoulder IR to 50 and Horizontal adduction AROM to 40 degrees.    Baseline IR is 40 and HA 40    Status Partially Met      PT LONG TERM GOAL #3   Title [place] will report an 80% improvement in self-reported  function by DC.    Status On-going      PT LONG TERM GOAL #4   Title Mykelle will be independent with her HEP at DC.    Status On-going                 Plan - 12/09/19 1800    Clinical Impression Statement [name] notes progress over the weekend, although she has pain today.  Added more strength activities to complement her current AROM program.  Continue strength progressions as appropriate along with capsular stretching to help [place] meet long-term goals.    Examination-Activity Limitations Bathing;Dressing;Hygiene/Grooming;Carry;Reach Overhead    Examination-Participation Restrictions Interpersonal Relationship;Occupation    Stability/Clinical Decision Making Stable/Uncomplicated    Rehab Potential Good    PT Frequency 2x / week    PT Duration Other (comment)    PT Treatment/Interventions ADLs/Self Care Home Management;Moist Heat;Cryotherapy;Therapeutic activities;Therapeutic exercise;Neuromuscular re-education;Patient/family education;Manual techniques;Passive range of motion;Joint Manipulations    PT Next Visit Plan AROM and strengthening of L shoulder    PT Home Exercise Plan Access Code: [email or web address]    Consulted and Agree with Plan of Care Family member/caregiver;Patient           Patient will benefit from skilled therapeutic intervention in order to improve the following deficits and impairments:  Decreased activity tolerance, Decreased endurance, Decreased range of motion, Decreased mobility, Impaired flexibility, Impaired UE functional use, Pain  Visit Diagnosis: Stiffness of left shoulder, not elsewhere classified  Chronic left shoulder pain  Impingement syndrome of left shoulder     Problem List Patient Active Problem List   Diagnosis Date Noted  . Left shoulder pain 10/31/2019  . Laryngitis 10/31/2019  . Leukopenia 09/15/2019  . PTSD (post-traumatic stress disorder) 09/15/2019  . History of chicken pox 09/15/2019  . Von Willebrand disease ([place])   .  Upper airway cough syndrome 09/06/2018  . Fibromyalgia 06/01/2015  . Insomnia 06/01/2015  . H/O syncope   . Palpitations   . Mitral valve prolapse     Farley Ly PT, MPT 12/09/2019, 6:01 PM  [facility] [address] [place], [place], [address] Phone: [phone]   Fax:  [phone]  Name: Fergie Sherbert MRN: [id number] Date of Birth: [birth year]

## 2019-12-09 NOTE — Patient Instructions (Signed)
Access Code: [email or web address] URL: [email or web address] Date: 12/09/2019 Prepared by: Pauletta Browns  Exercises Standing Shoulder Internal Rotation with Anchored Resistance - 1 x daily - 7 x weekly - 1 sets - 10 reps Shoulder External Rotation with Anchored Resistance with Towel Under Elbow - 1 x daily - 7 x weekly - 1 sets - 10 reps - 3 hold Scapular Retraction with Resistance - 1 x daily - 7 x weekly - 1 sets - 10 reps - 3 seconds hold

## 2019-12-12 ENCOUNTER — Ambulatory Visit (INDEPENDENT_AMBULATORY_CARE_PROVIDER_SITE_OTHER): Payer: Medicare Other | Admitting: Psychology

## 2019-12-12 DIAGNOSIS — F4310 Post-traumatic stress disorder, unspecified: Secondary | ICD-10-CM

## 2019-12-12 NOTE — Progress Notes (Signed)
I connected with [name] today by telephone and verified that I am speaking with the correct person using two identifiers. Location patient: home Location provider: work Persons participating in the virtual visit: patient, Engineer, civil (consulting).    I discussed the limitations, risks, security and privacy concerns of performing an evaluation and management service by telephone and the availability of in person appointments. I also discussed with the patient that there may be a patient responsible charge related to this service. The patient expressed understanding and verbally consented to this telephonic visit.    Interactive audio and video telecommunications were attempted between this provider and patient, however failed, due to patient having technical difficulties OR patient did not have access to video capability.  We continued and completed visit with audio only.  Some vital signs may be absent or patient reported.    Subjective:   Dawn Simmons is a 74 y.o. female who presents for Medicare Annual  preventive examination.  Review of Systems    Cardiac Risk Factors include: advanced age (>85men, >65 women)     Objective:      Advanced Directives 12/13/2019  Does Patient Have a Medical Advance Directive? Yes  Type of Estate agent of [place];Living will  Does patient want to make changes to medical advance directive? No - Patient declined  Copy of Healthcare Power of Attorney in Chart? No - copy requested    Current Medications (verified) Outpatient Encounter Medications as of 12/13/2019  Medication Sig  . Ascorbic Acid (VITAMIN C) 100 MG tablet Take 100 mg by mouth daily.  . Calcium-Magnesium 200-100 MG TABS Take 4 tablets by mouth daily. 2 tablets in the morning and 2 tablets in the evening  . diazepam (VALIUM) 5 MG tablet Take 5 mg by mouth every 6 (six) hours as needed for anxiety or muscle spasms. TAKE 1/2 TO 1 TABLET AT BED TIME as needed  . ergocalciferol  (DRISDOL) 200 MCG/ML drops Take by mouth daily. PURE brand  . FLUoxetine (PROZAC) 10 MG capsule Take 2 capsules (20 mg total) by mouth every morning.  Mar[place] Kitchen MAGNESIUM PO Take 200 mg by mouth at bedtime.  . Multiple Vitamin (MULTIVITAMIN) capsule Take 1 capsule by mouth daily.   No facility-administered encounter medications on file as of 12/13/2019.    Allergies (verified) Doxycycline, Tetanus toxoids, and Flexeril [cyclobenzaprine]   History: Past Medical History:  Diagnosis Date  . H/O fibromyalgia   . H/O insomnia   . H/O measles   . H/O rubella   . H/O syncope   . History of chicken pox 09/15/2019  . History of palpitations   . Hx of mumps   . Mitral valve prolapse   . Palpitations   . Syncope   . Von Willebrand disease ([facility])    Past Surgical History:  Procedure Laterality Date  . NO PAST SURGERIES     Family History  Problem Relation Age of Onset  . Stroke Mother   . Emphysema Father        smoked  . Heart attack Maternal Grandfather   . Heart attack Paternal Grandfather   . Heart attack Maternal Uncle   . Heart failure Maternal Grandmother   . Heart failure Paternal Grandmother   . Seizures Neg Hx    Social History   Socioeconomic History  . Marital status: Married    Spouse name: tony  . Number of children: 4  . Years of education: 41  . Highest education level: Not on file  Occupational  History  . Not on file  Tobacco Use  . Smoking status: Former Smoker    Packs/day: 0.50    Years: 10.00    Pack years: 5.00    Quit date: 04/27/1972    Years since quitting: 47.6  . Smokeless tobacco: Never Used  Vaping Use  . Vaping Use: Never used  Substance and Sexual Activity  . Alcohol use: Yes    Alcohol/week: 0.0 standard drinks    Comment: occ 1 glass wine or beer  . Drug use: No  . Sexual activity: Not on file  Other Topics Concern  . Not on file  Social History Narrative   Lives at home with husband   Caffeine use- tea, 1 cup/day   Social Determinants  of Health   Financial Resource Strain: Low Risk   . Difficulty of Paying Living Expenses: Not hard at all  Food Insecurity: No Food Insecurity  . Worried About Programme researcher, broadcasting/film/video in the Last Year: Never true  . Ran Out of Food in the Last Year: Never true  Transportation Needs: No Transportation Needs  . Lack of Transportation (Medical): No  . Lack of Transportation (Non-Medical): No  Physical Activity:   . Days of Exercise per Week: Not on file  . Minutes of Exercise per Session: Not on file  Stress:   . Feeling of Stress : Not on file  Social Connections:   . Frequency of Communication with Friends and Family: Not on file  . Frequency of Social Gatherings with Friends and Family: Not on file  . Attends Religious Services: Not on file  . Active Member of Clubs or Organizations: Not on file  . Attends Banker Meetings: Not on file  . Marital Status: Not on file    Tobacco Counseling Counseling given: Not Answered   Clinical Intake: Pain : No/denies pain  Activities of Daily Living In your present state of health, do you have any difficulty performing the following activities: 12/13/2019  Hearing? N  Vision? N  Difficulty concentrating or making decisions? Y  Walking or climbing stairs? N  Dressing or bathing? N  Doing errands, shopping? N  Preparing Food and eating ? N  Using the Toilet? N  In the past six months, have you accidently leaked urine? N  Do you have problems with loss of bowel control? N  Managing your Medications? N  Managing your Finances? N  Housekeeping or managing your Housekeeping? N  Some recent data might be hidden    Patient Care Team: Bradd Canary, MD as PCP - General (Family Medicine)  Indicate any recent Medical Services you may have received from other than [facility] providers in the past year (date may be approximate).     Assessment:   This is a routine wellness examination for [name].  Dietary issues and exercise  activities discussed: Current Exercise Habits: Structured exercise class, Time (Minutes): 30, Frequency (Times/Week): 2, Weekly Exercise (Minutes/Week): 60, Intensity: Mild, Exercise limited by: None identified Diet (meal preparation, eat out, water intake, caffeinated beverages, dairy products, fruits and vegetables): well balanced  Goals    . Maintain healthy lifestyle.      Depression Screen PHQ 2/9 Scores 12/13/2019  PHQ - 2 Score 0    Fall Risk Fall Risk  12/13/2019 06/01/2015  Falls in the past year? 0 Yes  Number falls in past yr: 0 2 or more  Injury with Fall? 0 No  Risk for fall due to : - Other (  Comment)  Risk for fall due to: Comment - due to passing out  Follow up Education provided;Falls prevention discussed -   Lives w/ husband.  Any stairs in or around the home? Yes  If so, are there any without handrails? No  Home free of loose throw rugs in walkways, pet beds, electrical cords, etc? Yes  Adequate lighting in your home to reduce risk of falls? Yes   ASSISTIVE DEVICES UTILIZED TO PREVENT FALLS: none needed per pt.       Immunizations Immunization History  Administered Date(s) Administered  . PFIZER SARS-COV-2 Vaccination 09/19/2019, 10/11/2019    Covid-19 vaccine status: Completed vaccines   Screening Tests Health Maintenance  Topic Date Due  . Hepatitis C Screening  Never done  . TETANUS/TDAP  Never done  . MAMMOGRAM  Never done  . COLONOSCOPY  Never done  . DEXA SCAN  Never done  . PNA vac Low Risk Adult (1 of 2 - PCV13) Never done  . INFLUENZA VACCINE  11/10/2019  . COVID-19 Vaccine  Completed    Health Maintenance  Health Maintenance Due  Topic Date Due  . Hepatitis C Screening  Never done  . TETANUS/TDAP  Never done  . MAMMOGRAM  Never done  . COLONOSCOPY  Never done  . DEXA SCAN  Never done  . PNA vac Low Risk Adult (1 of 2 - PCV13) Never done  . INFLUENZA VACCINE  11/10/2019    CCS-Cologurard 11/10/19 Pt states she will discuss  mammogram and bone density w/ PCP at next OV 02/04/20.   Additional Screening:  Vision Screening: Recommended annual ophthalmology exams for early detection of glaucoma and other disorders of the eye. Is the patient up to date with their annual eye exam?  Yes  per pt w/ Dr.Groat   Dental Screening: Recommended annual dental exams for proper oral hygiene  Community Resource Referral / Chronic Care Management: CRR required this visit?  No   CCM required this visit?  No      Plan:    Continue to eat heart healthy diet (full of fruits, vegetables, whole grains, lean protein, water--limit salt, fat, and sugar intake) and increase physical activity as tolerated.  Continue doing brain stimulating activities (puzzles, reading, adult coloring books, staying active) to keep memory sharp.   I have personally reviewed and noted the following in the patient's chart:   . Medical and social history . Use of alcohol, tobacco or illicit drugs  . Current medications and supplements . Functional ability and status . Nutritional status . Physical activity . Advanced directives . List of other physicians . Hospitalizations, surgeries, and ER visits in previous 12 months . Vitals . Screenings to include cognitive, depression, and falls . Referrals and appointments  In addition, I have reviewed and discussed with patient certain preventive protocols, quality metrics, and best practice recommendations. A written personalized care plan for preventive services as well as general preventive health recommendations were provided to patient.   Due to this being a telephonic visit, the after visit summary with patients personalized plan was offered to patient via mail or my-chart.  Patient would like to access on my-chart.  [place], [place]   12/13/2019

## 2019-12-13 ENCOUNTER — Encounter: Payer: Self-pay | Admitting: Rehabilitative and Restorative Service Providers"

## 2019-12-13 ENCOUNTER — Other Ambulatory Visit: Payer: Self-pay

## 2019-12-13 ENCOUNTER — Ambulatory Visit (INDEPENDENT_AMBULATORY_CARE_PROVIDER_SITE_OTHER): Payer: Medicare Other | Admitting: Rehabilitative and Restorative Service Providers"

## 2019-12-13 ENCOUNTER — Encounter: Payer: Self-pay | Admitting: *Deleted

## 2019-12-13 ENCOUNTER — Ambulatory Visit (INDEPENDENT_AMBULATORY_CARE_PROVIDER_SITE_OTHER): Payer: Medicare Other | Admitting: *Deleted

## 2019-12-13 DIAGNOSIS — M25612 Stiffness of left shoulder, not elsewhere classified: Secondary | ICD-10-CM

## 2019-12-13 DIAGNOSIS — M25512 Pain in left shoulder: Secondary | ICD-10-CM | POA: Diagnosis not present

## 2019-12-13 DIAGNOSIS — G8929 Other chronic pain: Secondary | ICD-10-CM

## 2019-12-13 DIAGNOSIS — Z0000 Encounter for general adult medical examination without abnormal findings: Secondary | ICD-10-CM

## 2019-12-13 DIAGNOSIS — M7542 Impingement syndrome of left shoulder: Secondary | ICD-10-CM | POA: Diagnosis not present

## 2019-12-13 NOTE — Patient Instructions (Signed)
Continue to eat heart healthy diet (full of fruits, vegetables, whole grains, lean protein, water--limit salt, fat, and sugar intake) and increase physical activity as tolerated.  Continue doing brain stimulating activities (puzzles, reading, adult coloring books, staying active) to keep memory sharp.    Dawn Simmons , Thank you for taking time to come for your Medicare Wellness Visit. I appreciate your ongoing commitment to your health goals. Please review the following plan we discussed and let me know if I can assist you in the future.   These are the goals we discussed: Goals    . Maintain healthy lifestyle.       This is a list of the screening recommended for you and due dates:  Health Maintenance  Topic Date Due  .  Hepatitis C: One time screening is recommended by Center for Disease Control  (CDC) for  adults born from [birth year] through 1964.   Never done  . Tetanus Vaccine  Never done  . Mammogram  Never done  . Colon Cancer Screening  Never done  . DEXA scan (bone density measurement)  Never done  . Pneumonia vaccines (1 of 2 - PCV13) Never done  . Flu Shot  11/10/2019  . COVID-19 Vaccine  Completed    Preventive Care 33 Years and Older, Female Preventive care refers to lifestyle choices and visits with your health care provider that can promote health and wellness. This includes:  A yearly physical exam. This is also called an annual well check.  Regular dental and eye exams.  Immunizations.  Screening for certain conditions.  Healthy lifestyle choices, such as diet and exercise. What can I expect for my preventive care visit? Physical exam Your health care provider will check:  Height and weight. These may be used to calculate body mass index (BMI), which is a measurement that tells if you are at a healthy weight.  Heart rate and blood pressure.  Your skin for abnormal spots. Counseling Your health care provider may ask you questions about:  Alcohol, tobacco,  and drug use.  Emotional well-being.  Home and relationship well-being.  Sexual activity.  Eating habits.  History of falls.  Memory and ability to understand (cognition).  Work and work Statistician.  Pregnancy and menstrual history. What immunizations do I need?  Influenza (flu) vaccine  This is recommended every year. Tetanus, diphtheria, and pertussis (Tdap) vaccine  You may need a Td booster every 10 years. Varicella (chickenpox) vaccine  You may need this vaccine if you have not already been vaccinated. Zoster (shingles) vaccine  You may need this after age 89. Pneumococcal conjugate (PCV13) vaccine  One dose is recommended after age 56. Pneumococcal polysaccharide (PPSV23) vaccine  One dose is recommended after age 41. Measles, mumps, and rubella (MMR) vaccine  You may need at least one dose of MMR if you were born in 1957 or later. You may also need a second dose. Meningococcal conjugate (MenACWY) vaccine  You may need this if you have certain conditions. Hepatitis A vaccine  You may need this if you have certain conditions or if you travel or work in places where you may be exposed to hepatitis A. Hepatitis B vaccine  You may need this if you have certain conditions or if you travel or work in places where you may be exposed to hepatitis B. Haemophilus influenzae type b (Hib) vaccine  You may need this if you have certain conditions. You may receive vaccines as individual doses or as more than  one vaccine together in one shot (combination vaccines). Talk with your health care provider about the risks and benefits of combination vaccines. What tests do I need? Blood tests  Lipid and cholesterol levels. These may be checked every 5 years, or more frequently depending on your overall health.  Hepatitis C test.  Hepatitis B test. Screening  Lung cancer screening. You may have this screening every year starting at age 10 if you have a 30-pack-year  history of smoking and currently smoke or have quit within the past 15 years.  Colorectal cancer screening. All adults should have this screening starting at age 29 and continuing until age 92. Your health care provider may recommend screening at age 17 if you are at increased risk. You will have tests every 1-10 years, depending on your results and the type of screening test.  Diabetes screening. This is done by checking your blood sugar (glucose) after you have not eaten for a while (fasting). You may have this done every 1-3 years.  Mammogram. This may be done every 1-2 years. Talk with your health care provider about how often you should have regular mammograms.  BRCA-related cancer screening. This may be done if you have a family history of breast, ovarian, tubal, or peritoneal cancers. Other tests  Sexually transmitted disease (STD) testing.  Bone density scan. This is done to screen for osteoporosis. You may have this done starting at age 47. Follow these instructions at home: Eating and drinking  Eat a diet that includes fresh fruits and vegetables, whole grains, lean protein, and low-fat dairy products. Limit your intake of foods with high amounts of sugar, saturated fats, and salt.  Take vitamin and mineral supplements as recommended by your health care provider.  Do not drink alcohol if your health care provider tells you not to drink.  If you drink alcohol: ? Limit how much you have to 0-1 drink a day. ? Be aware of how much alcohol is in your drink. In the [place] one drink equals one 12 oz bottle of beer (355 mL), one 5 oz glass of wine (148 mL), or one 1 oz glass of hard liquor (44 mL). Lifestyle  Take daily care of your teeth and gums.  Stay active. Exercise for at least 30 minutes on 5 or more days each week.  Do not use any products that contain nicotine or tobacco, such as cigarettes, e-cigarettes, and chewing tobacco. If you need help quitting, ask your health care  provider.  If you are sexually active, practice safe sex. Use a condom or other form of protection in order to prevent STIs (sexually transmitted infections).  Talk with your health care provider about taking a low-dose aspirin or statin. What's next?  Go to your health care provider once a year for a well check visit.  Ask your health care provider how often you should have your eyes and teeth checked.  Stay up to date on all vaccines. This information is not intended to replace advice given to you by your health care provider. Make sure you discuss any questions you have with your health care provider. Document Revised: 03/22/2018 Document Reviewed: 03/22/2018 Elsevier Patient Education  2020 [employer].

## 2019-12-13 NOTE — Therapy (Signed)
[facility] [address] [place], [place], [address] Phone: [phone]   Fax:  [phone]  Physical Therapy Treatment  Patient Details  Name: Dawn Simmons MRN: [id number] Date of Birth: [birth year] Referring Provider (PT): Gregor Hams MD   Encounter Date: 12/13/2019   PT End of Session - 12/13/19 1431    Visit Number 6    Number of Visits 20    Date for PT Re-Evaluation 02/07/20    Progress Note Due on Visit 10    PT Start Time 1430    PT Stop Time 1510    PT Time Calculation (min) 40 min    Activity Tolerance Patient tolerated treatment well    Behavior During Therapy [facility] for tasks assessed/performed           Past Medical History:  Diagnosis Date  . H/O fibromyalgia   . H/O insomnia   . H/O measles   . H/O rubella   . H/O syncope   . History of chicken pox 09/15/2019  . History of palpitations   . Hx of mumps   . Mitral valve prolapse   . Palpitations   . Syncope   . Von Willebrand disease ([place])     Past Surgical History:  Procedure Laterality Date  . NO PAST SURGERIES      There were no vitals filed for this visit.   Subjective Assessment - 12/13/19 1437    Subjective Pt. indicated no specific pain today upon arrival.  Still sore at times.  Had bad day the other day for pain.    Patient is accompained by: Family member    Limitations Other (comment)    Patient Stated Goals Be able to use the L shoulder without pain.    Currently in Pain? No/denies    Pain Onset More than a month ago                             [facility] Adult PT Treatment/Exercise - 12/13/19 0001      Neuro Re-ed    Neuro Re-ed Details  100 deg r/s holds c very mild resistance, er/ir in neutral c mild resistance 15 sec bouts      Shoulder Exercises: Supine   Other Supine Exercises supine wand flexion 2 lbs 2 x 10      Shoulder Exercises: Seated   Other Seated Exercises scapular retraction 5 sec hold x 10      Shoulder  Exercises: Sidelying   External Rotation AROM;Strengthening;Left;20 reps    ABduction AROM;Strengthening;20 reps      Shoulder Exercises: Pulleys   Flexion 2 minutes    ABduction 2 minutes      Manual Therapy   Manual therapy comments prom, g2 inferior jt mobs Lt Gh                       PT Long Term Goals - 12/09/19 1759      PT LONG TERM GOAL #1   Title [place] will report L shoulder pain consistently 0-3/10 on the Numeric Pain Rating Scale.    Baseline Can be 7+/10    Status On-going      PT LONG TERM GOAL #2   Title Improve L shoulder IR to 50 and Horizontal adduction AROM to 40 degrees.    Baseline IR is 40 and HA 40    Status Partially Met      PT LONG  TERM GOAL #3   Title Osa will report an 80% improvement in self-reported function by DC.    Status On-going      PT LONG TERM GOAL #4   Title Latunya will be independent with her HEP at DC.    Status On-going                 Plan - 12/13/19 1447    Clinical Impression Statement Pt. has continued to show improvement in passive and active movement (gravity reduced) to this point.  Continued muscle guarding to movement and anxiety c movement noted at times.  Pt. to beneift from gravity reduced AROM progression for improved AROM.    Examination-Activity Limitations Bathing;Dressing;Hygiene/Grooming;Carry;Reach Overhead    Examination-Participation Restrictions Interpersonal Relationship;Occupation    Stability/Clinical Decision Making Stable/Uncomplicated    Rehab Potential Good    PT Frequency 2x / week    PT Duration Other (comment)    PT Treatment/Interventions ADLs/Self Care Home Management;Moist Heat;Cryotherapy;Therapeutic activities;Therapeutic exercise;Neuromuscular re-education;Patient/family education;Manual techniques;Passive range of motion;Joint Manipulations    PT Next Visit Plan Gravity reduced AROM, strength progression as tolerated.    PT Home Exercise Plan Access Code: [email or web address]     Consulted and Agree with Plan of Care Family member/caregiver;Patient           Patient will benefit from skilled therapeutic intervention in order to improve the following deficits and impairments:  Decreased activity tolerance, Decreased endurance, Decreased range of motion, Decreased mobility, Impaired flexibility, Impaired UE functional use, Pain  Visit Diagnosis: Chronic left shoulder pain  Stiffness of left shoulder, not elsewhere classified  Impingement syndrome of left shoulder     Problem List Patient Active Problem List   Diagnosis Date Noted  . Left shoulder pain 10/31/2019  . Laryngitis 10/31/2019  . Leukopenia 09/15/2019  . PTSD (post-traumatic stress disorder) 09/15/2019  . History of chicken pox 09/15/2019  . Von Willebrand disease ([place])   . Upper airway cough syndrome 09/06/2018  . Fibromyalgia 06/01/2015  . Insomnia 06/01/2015  . H/O syncope   . Palpitations   . Mitral valve prolapse     Scot Jun, PT, DPT, OCS, ATC 12/13/19  3:08 PM    [facility] [address] [place], [place], [address] Phone: [phone]   Fax:  [phone]  Name: Tasmin Exantus MRN: [id number] Date of Birth: 04/28/1945

## 2019-12-17 ENCOUNTER — Encounter: Payer: Self-pay | Admitting: Rehabilitative and Restorative Service Providers"

## 2019-12-17 ENCOUNTER — Other Ambulatory Visit: Payer: Self-pay

## 2019-12-17 ENCOUNTER — Ambulatory Visit (INDEPENDENT_AMBULATORY_CARE_PROVIDER_SITE_OTHER): Payer: Medicare Other | Admitting: Rehabilitative and Restorative Service Providers"

## 2019-12-17 DIAGNOSIS — M25512 Pain in left shoulder: Secondary | ICD-10-CM

## 2019-12-17 DIAGNOSIS — M25612 Stiffness of left shoulder, not elsewhere classified: Secondary | ICD-10-CM | POA: Diagnosis not present

## 2019-12-17 DIAGNOSIS — G8929 Other chronic pain: Secondary | ICD-10-CM | POA: Diagnosis not present

## 2019-12-17 DIAGNOSIS — M7542 Impingement syndrome of left shoulder: Secondary | ICD-10-CM | POA: Diagnosis not present

## 2019-12-17 NOTE — Therapy (Signed)
[facility] [address] [place], [place], [address] Phone: [phone]   Fax:  [phone]  Physical Therapy Treatment  Patient Details  Name: Dawn Simmons MRN: [id number] Date of Birth: [birth year] Referring Provider (PT): Gregor Hams MD   Encounter Date: 12/17/2019   PT End of Session - 12/17/19 1306    Visit Number 7    Number of Visits 20    Date for PT Re-Evaluation 02/07/20    Progress Note Due on Visit 10    PT Start Time 1301    PT Stop Time 1340    PT Time Calculation (min) 39 min    Activity Tolerance Patient tolerated treatment well    Behavior During Therapy [facility] for tasks assessed/performed           Past Medical History:  Diagnosis Date  . H/O fibromyalgia   . H/O insomnia   . H/O measles   . H/O rubella   . H/O syncope   . History of chicken pox 09/15/2019  . History of palpitations   . Hx of mumps   . Mitral valve prolapse   . Palpitations   . Syncope   . Von Willebrand disease ([place])     Past Surgical History:  Procedure Laterality Date  . NO PAST SURGERIES      There were no vitals filed for this visit.   Subjective Assessment - 12/17/19 1305    Subjective Pt. indicated having some nights of waking due to some pain but overall felt like some exercises were feeling better.  Pt. stated no pain upon arrival today.    Patient is accompained by: Family member    Limitations Other (comment)    Patient Stated Goals Be able to use the L shoulder without pain.    Currently in Pain? No/denies    Pain Score 0-No pain    Pain Onset More than a month ago                             [facility] Adult PT Treatment/Exercise - 12/17/19 0001      Neuro Re-ed    Neuro Re-ed Details  100 deg r/s holds c very mild resistance, er/ir in neutral c mild resistance 15 sec bouts      Shoulder Exercises: Supine   Flexion AROM;Left   3 x 10   Shoulder Flexion Weight (lbs) 1    Other Supine Exercises supine wand  flexion 2 lbs 2 x 10      Shoulder Exercises: Sidelying   ABduction Left;Strengthening;AROM;20 reps      Shoulder Exercises: Pulleys   Flexion 3 minutes   5 sec hold   ABduction 3 minutes      Shoulder Exercises: ROM/Strengthening   UBE (Upper Arm Bike) Lvl 3 3 mins fwd/back each way                       PT Long Term Goals - 12/09/19 1759      PT LONG TERM GOAL #1   Title Dawn Simmons will report L shoulder pain consistently 0-3/10 on the Numeric Pain Rating Scale.    Baseline Can be 7+/10    Status On-going      PT LONG TERM GOAL #2   Title Improve L shoulder IR to 50 and Horizontal adduction AROM to 40 degrees.    Baseline IR is 40 and HA 40    Status  Partially Met      PT LONG TERM GOAL #3   Title Dawn Simmons will report an 80% improvement in self-reported function by DC.    Status On-going      PT LONG TERM GOAL #4   Title Dawn Simmons will be independent with her HEP at DC.    Status On-going                 Plan - 12/17/19 1335    Clinical Impression Statement Active mobility improvements continued to show progression at this time c abduction showing most difficulty/impairment.  Continued cues for improved active mobility use of Lt UE at home.  Continued skilled PT services indicated at this time.    Examination-Activity Limitations Bathing;Dressing;Hygiene/Grooming;Carry;Reach Overhead    Examination-Participation Restrictions Interpersonal Relationship;Occupation    Stability/Clinical Decision Making Stable/Uncomplicated    Rehab Potential Good    PT Frequency 2x / week    PT Duration Other (comment)    PT Treatment/Interventions ADLs/Self Care Home Management;Moist Heat;Cryotherapy;Therapeutic activities;Therapeutic exercise;Neuromuscular re-education;Patient/family education;Manual techniques;Passive range of motion;Joint Manipulations    PT Next Visit Plan Gravity reduced AROM, strength progression as tolerated continued    PT Home Exercise Plan  Access Code: [email or web address]    Consulted and Agree with Plan of Care Family member/caregiver;Patient           Patient will benefit from skilled therapeutic intervention in order to improve the following deficits and impairments:  Decreased activity tolerance, Decreased endurance, Decreased range of motion, Decreased mobility, Impaired flexibility, Impaired UE functional use, Pain  Visit Diagnosis: Chronic left shoulder pain  Stiffness of left shoulder, not elsewhere classified  Impingement syndrome of left shoulder     Problem List Patient Active Problem List   Diagnosis Date Noted  . Left shoulder pain 10/31/2019  . Laryngitis 10/31/2019  . Leukopenia 09/15/2019  . PTSD (post-traumatic stress disorder) 09/15/2019  . History of chicken pox 09/15/2019  . Von Willebrand disease ([place])   . Upper airway cough syndrome 09/06/2018  . Fibromyalgia 06/01/2015  . Insomnia 06/01/2015  . H/O syncope   . Palpitations   . Mitral valve prolapse     Scot Jun, PT, DPT, OCS, ATC 12/17/19  1:39 PM    [facility] [address] [place], [place], [address] Phone: [phone]   Fax:  [phone]  Name: Dawn Simmons MRN: [id number] Date of Birth: [birth year]

## 2019-12-19 ENCOUNTER — Other Ambulatory Visit: Payer: Self-pay

## 2019-12-19 ENCOUNTER — Encounter: Payer: Self-pay | Admitting: Rehabilitative and Restorative Service Providers"

## 2019-12-19 ENCOUNTER — Ambulatory Visit (INDEPENDENT_AMBULATORY_CARE_PROVIDER_SITE_OTHER): Payer: PRIVATE HEALTH INSURANCE | Admitting: Psychology

## 2019-12-19 ENCOUNTER — Ambulatory Visit (INDEPENDENT_AMBULATORY_CARE_PROVIDER_SITE_OTHER): Payer: Medicare Other | Admitting: Rehabilitative and Restorative Service Providers"

## 2019-12-19 DIAGNOSIS — M25512 Pain in left shoulder: Secondary | ICD-10-CM

## 2019-12-19 DIAGNOSIS — M7542 Impingement syndrome of left shoulder: Secondary | ICD-10-CM | POA: Diagnosis not present

## 2019-12-19 DIAGNOSIS — M25612 Stiffness of left shoulder, not elsewhere classified: Secondary | ICD-10-CM

## 2019-12-19 DIAGNOSIS — F4310 Post-traumatic stress disorder, unspecified: Secondary | ICD-10-CM | POA: Diagnosis not present

## 2019-12-19 DIAGNOSIS — G8929 Other chronic pain: Secondary | ICD-10-CM

## 2019-12-19 NOTE — Therapy (Signed)
[facility] [address] [place], [place], [address] Phone: [phone]   Fax:  [phone]  Physical Therapy Treatment  Patient Details  Name: Dawn Simmons MRN: [id number] Date of Birth: 11/16/1945 Referring Provider (PT): Gregor Hams MD   Encounter Date: 12/19/2019   PT End of Session - 12/19/19 1523    Visit Number 8    Number of Visits 20    Date for PT Re-Evaluation 02/07/20    Progress Note Due on Visit 10    PT Start Time 5189    PT Stop Time 1557    PT Time Calculation (min) 40 min    Activity Tolerance Patient tolerated treatment well    Behavior During Therapy [facility] for tasks assessed/performed           Past Medical History:  Diagnosis Date  . H/O fibromyalgia   . H/O insomnia   . H/O measles   . H/O rubella   . H/O syncope   . History of chicken pox 09/15/2019  . History of palpitations   . Hx of mumps   . Mitral valve prolapse   . Palpitations   . Syncope   . Von Willebrand disease ([place])     Past Surgical History:  Procedure Laterality Date  . NO PAST SURGERIES      There were no vitals filed for this visit.   Subjective Assessment - 12/19/19 1522    Subjective Pt. indicated feeling trouble c sleeping in last few days.  Pt. caregiver stated the patient doesn't complaint too much during the day.    Patient is accompained by: Family member    Limitations Other (comment)    Patient Stated Goals Be able to use the L shoulder without pain.    Currently in Pain? No/denies    Pain Score 0-No pain   no pain at rest   Pain Onset More than a month ago    Pain Frequency Intermittent    Aggravating Factors  sleeping              [facility] PT Assessment - 12/19/19 0001      Strength   Left Shoulder Flexion 3+/5    Left Shoulder ABduction 3+/5                         [facility] Adult PT Treatment/Exercise - 12/19/19 0001      Shoulder Exercises: Supine   Flexion Left   3 x 10   Shoulder Flexion Weight  (lbs) 1    Other Supine Exercises wand flexion 2 lbs x 15      Shoulder Exercises: Standing   External Rotation Left   2 x 15 green band   Theraband Level (Shoulder External Rotation) Level 3 (Green)    Internal Rotation Strengthening;Left   2 x 20 green band   Theraband Level (Shoulder Internal Rotation) Level 3 (Green)    Row Both   30 x   Theraband Level (Shoulder Row) Level 3 (Green)      Shoulder Exercises: Pulleys   Flexion 3 minutes    ABduction 3 minutes      Modalities   Modalities Teacher, English as a foreign language Location Lt shoulder    Electrical Stimulation Action IFC    Electrical Stimulation Parameters 15 mins    Electrical Stimulation Goals Pain  PT Education - 12/19/19 1523    Education Details Estim application    Person(s) Educated Patient    Methods Explanation    Comprehension Verbalized understanding               PT Long Term Goals - 12/09/19 1759      PT LONG TERM GOAL #1   Title Dawn Simmons will report L shoulder pain consistently 0-3/10 on the Numeric Pain Rating Scale.    Baseline Can be 7+/10    Status On-going      PT LONG TERM GOAL #2   Title Improve L shoulder IR to 50 and Horizontal adduction AROM to 40 degrees.    Baseline IR is 40 and HA 40    Status Partially Met      PT LONG TERM GOAL #3   Title Dawn Simmons will report an 80% improvement in self-reported function by DC.    Status On-going      PT LONG TERM GOAL #4   Title Dawn Simmons will be independent with her HEP at DC.    Status On-going                 Plan - 12/19/19 1547    Clinical Impression Statement Pt. demonstrated fatigue in strengthening intervention but continued to show increased capacity for active movement compared to previous.  Symptoms evident at night per reports as continued trouble.  Occasional end range symptoms and mid range symptoms indicated during full flexion/abduction movements  in clinic.  Continued skilled PT services indicated at this time.    Examination-Activity Limitations Bathing;Dressing;Hygiene/Grooming;Carry;Reach Overhead    Examination-Participation Restrictions Interpersonal Relationship;Occupation    Stability/Clinical Decision Making Stable/Uncomplicated    Rehab Potential Good    PT Frequency 2x / week    PT Duration Other (comment)    PT Treatment/Interventions ADLs/Self Care Home Management;Moist Heat;Cryotherapy;Therapeutic activities;Therapeutic exercise;Neuromuscular re-education;Patient/family education;Manual techniques;Passive range of motion;Joint Manipulations    PT Next Visit Plan Continued strengthening/endurance intervention for Lt shoulder    PT Home Exercise Plan Access Code: [email or web address]    Consulted and Agree with Plan of Care Family member/caregiver;Patient           Patient will benefit from skilled therapeutic intervention in order to improve the following deficits and impairments:  Decreased activity tolerance, Decreased endurance, Decreased range of motion, Decreased mobility, Impaired flexibility, Impaired UE functional use, Pain  Visit Diagnosis: Chronic left shoulder pain  Stiffness of left shoulder, not elsewhere classified  Impingement syndrome of left shoulder     Problem List Patient Active Problem List   Diagnosis Date Noted  . Left shoulder pain 10/31/2019  . Laryngitis 10/31/2019  . Leukopenia 09/15/2019  . PTSD (post-traumatic stress disorder) 09/15/2019  . History of chicken pox 09/15/2019  . Von Willebrand disease ([place])   . Upper airway cough syndrome 09/06/2018  . Fibromyalgia 06/01/2015  . Insomnia 06/01/2015  . H/O syncope   . Palpitations   . Mitral valve prolapse     Scot Jun, PT, DPT, OCS, ATC 12/19/19  3:49 PM    [facility] [address] [place], [place], [address] Phone: [phone]   Fax:  [phone]  Name: Dawn Simmons MRN:  [id number] Date of Birth: February 23, 1946

## 2019-12-26 ENCOUNTER — Ambulatory Visit (INDEPENDENT_AMBULATORY_CARE_PROVIDER_SITE_OTHER): Payer: Medicare Other | Admitting: Psychology

## 2019-12-26 DIAGNOSIS — F4310 Post-traumatic stress disorder, unspecified: Secondary | ICD-10-CM

## 2019-12-30 ENCOUNTER — Telehealth: Payer: Self-pay | Admitting: *Deleted

## 2019-12-30 NOTE — Telephone Encounter (Signed)
FYI  Patient called and stated that she fell yesterday and she has pain in her right hip and pain runs down to her right knee.  Advised patient that she head to nearest urgent care today or make an appt for tomorrow with another provider or call her sports med doc for her to be evaluated.  She stated that she has an appt tomorrow for a massage at [facility] that she will try first.  Also she will use ice and heat tonight.  Advised if pain worsens overnight to head nearest urgent care if they are open or ED.  If she is not better she will call sports med doc.

## 2019-12-31 ENCOUNTER — Ambulatory Visit: Payer: Medicare Other | Admitting: Family Medicine

## 2020-01-02 ENCOUNTER — Ambulatory Visit: Payer: Medicare Other | Admitting: Psychology

## 2020-01-03 ENCOUNTER — Encounter: Payer: Self-pay | Admitting: Rehabilitative and Restorative Service Providers"

## 2020-01-03 ENCOUNTER — Other Ambulatory Visit: Payer: Self-pay

## 2020-01-03 ENCOUNTER — Ambulatory Visit (INDEPENDENT_AMBULATORY_CARE_PROVIDER_SITE_OTHER): Payer: Medicare Other | Admitting: Rehabilitative and Restorative Service Providers"

## 2020-01-03 DIAGNOSIS — M25612 Stiffness of left shoulder, not elsewhere classified: Secondary | ICD-10-CM

## 2020-01-03 DIAGNOSIS — G8929 Other chronic pain: Secondary | ICD-10-CM

## 2020-01-03 DIAGNOSIS — M25512 Pain in left shoulder: Secondary | ICD-10-CM

## 2020-01-03 DIAGNOSIS — M7542 Impingement syndrome of left shoulder: Secondary | ICD-10-CM | POA: Diagnosis not present

## 2020-01-03 NOTE — Therapy (Signed)
[facility] [address] [place], [place], [address] Phone: [phone]   Fax:  [phone]  Physical Therapy Treatment  Patient Details  Name: Dawn Simmons MRN: [id number] Date of Birth: November 20, 1945 Referring Provider (PT): Gregor Hams MD   Encounter Date: 01/03/2020   PT End of Session - 01/03/20 1727    Visit Number 9    Number of Visits 20    Date for PT Re-Evaluation 02/07/20    Progress Note Due on Visit 10    PT Start Time 1301    PT Stop Time 1345    PT Time Calculation (min) 44 min    Activity Tolerance Patient tolerated treatment well;Patient limited by fatigue    Behavior During Therapy [facility] for tasks assessed/performed           Past Medical History:  Diagnosis Date   H/O fibromyalgia    H/O insomnia    H/O measles    H/O rubella    H/O syncope    History of chicken pox 09/15/2019   History of palpitations    Hx of mumps    Mitral valve prolapse    Palpitations    Syncope    Von Willebrand disease ([place])     Past Surgical History:  Procedure Laterality Date   NO PAST SURGERIES      There were no vitals filed for this visit.   Subjective Assessment - 01/03/20 1724    Subjective Dawn Simmons has been sleeping better (with medications).  She has a list of aches and pains other than her shoulder.    Patient is accompained by: Family member    Limitations Other (comment)    Patient Stated Goals Be able to use the L shoulder without pain.    Currently in Pain? Yes    Pain Score 7     Pain Location Other (Comment)   Multiple areas, too numerous to list   Pain Descriptors / Indicators Aching;Headache;Discomfort;Sore;Tightness    Pain Type Chronic pain    Pain Onset More than a month ago    Pain Frequency Constant              [facility] PT Assessment - 01/03/20 0001      AROM   Left Shoulder Flexion 150 Degrees    Left Shoulder Internal Rotation 40 Degrees    Left Shoulder External Rotation 95 Degrees     Left Shoulder Horizontal ADduction 45 Degrees                         [facility] Adult PT Treatment/Exercise - 01/03/20 0001      Exercises   Exercises Shoulder      Shoulder Exercises: Supine   Protraction Strengthening;Both;20 reps;Weights   2#   Internal Rotation 10 reps;Both   10 seconds     Shoulder Exercises: Sidelying   External Rotation Strengthening;Left;10 reps;Weights   2 sets of 10  1#     Shoulder Exercises: Standing   Retraction Strengthening;10 reps   5 seconds   Other Standing Exercises Biceps curls 20X 2# slow eccentrics                  PT Education - 01/03/20 1726    Education Details Reviewed the importance of consistent HEP compliance (none over the past week).  Reviewed HEP.    Person(s) Educated Patient;Spouse    Methods Explanation;Demonstration;Verbal cues;Handout    Comprehension Verbalized understanding;Returned demonstration;Verbal cues required;Need further instruction  PT Long Term Goals - 01/03/20 1727      PT LONG TERM GOAL #1   Title Dawn Simmons will report L shoulder pain consistently 0-3/10 on the Numeric Pain Rating Scale.    Baseline Can be 7+/10    Status On-going      PT LONG TERM GOAL #2   Title Improve L shoulder IR to 50 and Horizontal adduction AROM to 40 degrees.    Baseline IR is 40 and HA 40    Status Partially Met      PT LONG TERM GOAL #3   Title Dawn Simmons will report an 80% improvement in self-reported function by DC.    Status On-going      PT LONG TERM GOAL #4   Title Dawn Simmons will be independent with her HEP at DC.    Status On-going                 Plan - 01/03/20 1728    Clinical Impression Statement Dawn Simmons noted multiple ailments that have kept her from participating in her shoulder PT.  Objectively, her AROM is better than at evaluation.  Strength is poor and she will certainly benefit functionally and from a pain stand-point with less impingement with improved  shoulder strength.    Examination-Activity Limitations Bathing;Dressing;Hygiene/Grooming;Carry;Reach Overhead    Examination-Participation Restrictions Interpersonal Relationship;Occupation    Stability/Clinical Decision Making Stable/Uncomplicated    Rehab Potential Good    PT Frequency 2x / week    PT Duration Other (comment)    PT Treatment/Interventions ADLs/Self Care Home Management;Moist Heat;Cryotherapy;Therapeutic activities;Therapeutic exercise;Neuromuscular re-education;Patient/family education;Manual techniques;Passive range of motion;Joint Manipulations    PT Next Visit Plan Continued strengthening/endurance intervention for Lt shoulder    PT Home Exercise Plan Access Code: [email or web address]    Consulted and Agree with Plan of Care Family member/caregiver;Patient    Family Member Consulted Husband           Patient will benefit from skilled therapeutic intervention in order to improve the following deficits and impairments:  Decreased activity tolerance, Decreased endurance, Decreased range of motion, Decreased mobility, Impaired flexibility, Impaired UE functional use, Pain  Visit Diagnosis: Chronic left shoulder pain  Stiffness of left shoulder, not elsewhere classified  Impingement syndrome of left shoulder     Problem List Patient Active Problem List   Diagnosis Date Noted   Left shoulder pain 10/31/2019   Laryngitis 10/31/2019   Leukopenia 09/15/2019   PTSD (post-traumatic stress disorder) 09/15/2019   History of chicken pox 09/15/2019   Von Willebrand disease ([place])    Upper airway cough syndrome 09/06/2018   Fibromyalgia 06/01/2015   Insomnia 06/01/2015   H/O syncope    Palpitations    Mitral valve prolapse     Farley Ly PT, MPT 01/03/2020, 5:31 PM  [facility] [address] [place], [place], [address] Phone: [phone]   Fax:  [phone]  Name: Dawn Simmons MRN: [id number] Date of Birth:  23-Dec-1945

## 2020-01-06 ENCOUNTER — Encounter: Payer: Medicare Other | Admitting: Rehabilitative and Restorative Service Providers"

## 2020-01-08 ENCOUNTER — Encounter: Payer: Self-pay | Admitting: Rehabilitative and Restorative Service Providers"

## 2020-01-08 ENCOUNTER — Other Ambulatory Visit: Payer: Self-pay

## 2020-01-08 ENCOUNTER — Ambulatory Visit (INDEPENDENT_AMBULATORY_CARE_PROVIDER_SITE_OTHER): Payer: Medicare Other | Admitting: Rehabilitative and Restorative Service Providers"

## 2020-01-08 DIAGNOSIS — M25612 Stiffness of left shoulder, not elsewhere classified: Secondary | ICD-10-CM | POA: Diagnosis not present

## 2020-01-08 DIAGNOSIS — G8929 Other chronic pain: Secondary | ICD-10-CM | POA: Diagnosis not present

## 2020-01-08 DIAGNOSIS — M25512 Pain in left shoulder: Secondary | ICD-10-CM

## 2020-01-08 NOTE — Therapy (Signed)
[facility] [address] [place], [place], [address] Phone: [phone]   Fax:  [phone]  Physical Therapy Treatment/Progress Note  Patient Details  Name: Dawn Simmons MRN: [id number] Date of Birth: 04-Sep-1945 Referring Provider (PT): Gregor Hams MD   Encounter Date: 01/08/2020   PT End of Session - 01/08/20 1523    Visit Number 10    Number of Visits 20    Date for PT Re-Evaluation 02/07/20    Progress Note Due on Visit 20    PT Start Time 1430    PT Stop Time 1523    PT Time Calculation (min) 53 min    Activity Tolerance Patient tolerated treatment well;Patient limited by fatigue    Behavior During Therapy [facility] for tasks assessed/performed           Past Medical History:  Diagnosis Date  . H/O fibromyalgia   . H/O insomnia   . H/O measles   . H/O rubella   . H/O syncope   . History of chicken pox 09/15/2019  . History of palpitations   . Hx of mumps   . Mitral valve prolapse   . Palpitations   . Syncope   . Von Willebrand disease ([place])     Past Surgical History:  Procedure Laterality Date  . NO PAST SURGERIES      There were no vitals filed for this visit.   Subjective Assessment - 01/08/20 [place] notes progress with her sleeping and ability to get dressed without pain since starting physical therapy.  Strength and overhead function are still limited.    Patient is accompained by: Family member    Limitations Other (comment)    Patient Stated Goals Be able to use the L shoulder without pain.    Currently in Pain? Yes    Pain Score 5     Pain Location Shoulder    Pain Orientation Left    Pain Descriptors / Indicators Aching;Tightness;Sore    Pain Type Chronic pain    Pain Onset More than a month ago    Pain Frequency Constant    Aggravating Factors  Overhead function    Pain Relieving Factors Meds    Effect of Pain on Daily Activities Limits participation in ADLs, although she is  participating more than she was 6 weeks ago.              [facility] PT Assessment - 01/08/20 0001      AROM   Left Shoulder Flexion 150 Degrees    Left Shoulder Internal Rotation 45 Degrees    Left Shoulder External Rotation 95 Degrees    Left Shoulder Horizontal ADduction 45 Degrees                         [facility] Adult PT Treatment/Exercise - 01/08/20 0001      Exercises   Exercises Shoulder      Shoulder Exercises: Supine   Protraction Strengthening;Both;20 reps;Weights   2#   Internal Rotation 10 reps;Both   10 seconds     Shoulder Exercises: Sidelying   External Rotation Strengthening;Left;10 reps;Weights   2 sets of 10  1#     Shoulder Exercises: Standing   Retraction Strengthening;10 reps   5 seconds   Other Standing Exercises Biceps curls 20X 2# slow eccentrics                  PT Education - 01/08/20 1521  Education Details Reviewed HEP and exam findings.  Reinforced the need to progress strength ASAP.    Person(s) Educated Patient;Spouse    Methods Explanation;Demonstration;Tactile cues;Verbal cues    Comprehension Returned demonstration;Need further instruction;Verbal cues required;Verbalized understanding               PT Long Term Goals - 01/08/20 1521      PT LONG TERM GOAL #1   Title Dawn Simmons will report L shoulder pain consistently 0-3/10 on the Numeric Pain Rating Scale.    Baseline Can be 7+/10    Status On-going      PT LONG TERM GOAL #2   Title Improve L shoulder IR to 50 and Horizontal adduction AROM to 40 degrees.    Baseline IR is 40 and HA 40    Status Partially Met      PT LONG TERM GOAL #3   Title Dawn Simmons will report an 80% improvement in self-reported function by DC.    Baseline 50% improved.    Status On-going      PT LONG TERM GOAL #4   Title Dawn Simmons will be independent with her HEP at DC.    Baseline Husband is very involved in HEP compliance and ensuring correct technique.    Status On-going                   Plan - 01/08/20 1523    Clinical [place] notes progress with her sleeping, ability to get dressed/undressed and increased independence with ADLs since starting PT.  L shoulder strength remains poor and she is still reliant on her husband to assist with many ADLs and for assistance with her exercises.  Her chronic shoulder pain has improved and will likely continue to improve as strength progresses.  This has been a 2+ year problem and will benefit from a longer course of physical therapy.  HEP compliance and independence will be important along with periodic PT visits for reassessment and progressions.    Examination-Activity Limitations Bathing;Dressing;Hygiene/Grooming;Carry;Reach Overhead    Examination-Participation Restrictions Interpersonal Relationship;Occupation    Stability/Clinical Decision Making Stable/Uncomplicated    Rehab Potential Good    PT Frequency Other (comment)   1-2X/week   PT Duration 8 weeks    PT Treatment/Interventions ADLs/Self Care Home Management;Moist Heat;Cryotherapy;Therapeutic activities;Therapeutic exercise;Neuromuscular re-education;Patient/family education;Manual techniques;Passive range of motion;Joint Manipulations    PT Next Visit Plan Continued strengthening/endurance intervention for Lt shoulder    PT Home Exercise Plan Access Code: [email or web address]    Consulted and Agree with Plan of Care Family member/caregiver;Patient    Family Member Consulted Husband           Patient will benefit from skilled therapeutic intervention in order to improve the following deficits and impairments:  Decreased activity tolerance, Decreased endurance, Decreased range of motion, Decreased mobility, Impaired flexibility, Impaired UE functional use, Pain  Visit Diagnosis: Chronic left shoulder pain  Stiffness of left shoulder, not elsewhere classified     Problem List Patient Active Problem List   Diagnosis Date Noted  . Left  shoulder pain 10/31/2019  . Laryngitis 10/31/2019  . Leukopenia 09/15/2019  . PTSD (post-traumatic stress disorder) 09/15/2019  . History of chicken pox 09/15/2019  . Von Willebrand disease ([place])   . Upper airway cough syndrome 09/06/2018  . Fibromyalgia 06/01/2015  . Insomnia 06/01/2015  . H/O syncope   . Palpitations   . Mitral valve prolapse     Farley Ly PT, MPT 01/08/2020, 3:28 PM  [facility] [address] [place], [place], [address] Phone: [phone]   Fax:  [phone]  Name: Dawn Simmons MRN: [id number] Date of Birth: [birth year]

## 2020-01-09 ENCOUNTER — Ambulatory Visit (INDEPENDENT_AMBULATORY_CARE_PROVIDER_SITE_OTHER): Payer: Medicare Other | Admitting: Psychology

## 2020-01-09 DIAGNOSIS — F4310 Post-traumatic stress disorder, unspecified: Secondary | ICD-10-CM

## 2020-01-10 ENCOUNTER — Ambulatory Visit (HOSPITAL_BASED_OUTPATIENT_CLINIC_OR_DEPARTMENT_OTHER)
Admission: RE | Admit: 2020-01-10 | Discharge: 2020-01-10 | Disposition: A | Discharge status: Home / Self Care | Payer: Medicare Other | Source: Ambulatory Visit | Attending: Family Medicine | Admitting: Family Medicine

## 2020-01-10 ENCOUNTER — Other Ambulatory Visit: Payer: Self-pay

## 2020-01-10 ENCOUNTER — Telehealth (INDEPENDENT_AMBULATORY_CARE_PROVIDER_SITE_OTHER): Payer: Medicare Other | Admitting: Family Medicine

## 2020-01-10 ENCOUNTER — Encounter: Payer: Medicare Other | Admitting: Rehabilitative and Restorative Service Providers"

## 2020-01-10 VITALS — Ht 65.0 in | Wt 125.0 lb

## 2020-01-10 DIAGNOSIS — M25551 Pain in right hip: Secondary | ICD-10-CM | POA: Insufficient documentation

## 2020-01-10 DIAGNOSIS — F4310 Post-traumatic stress disorder, unspecified: Secondary | ICD-10-CM

## 2020-01-10 DIAGNOSIS — R531 Weakness: Secondary | ICD-10-CM

## 2020-01-10 DIAGNOSIS — Z87898 Personal history of other specified conditions: Secondary | ICD-10-CM | POA: Diagnosis not present

## 2020-01-10 DIAGNOSIS — R1031 Right lower quadrant pain: Secondary | ICD-10-CM | POA: Diagnosis not present

## 2020-01-10 DIAGNOSIS — G4700 Insomnia, unspecified: Secondary | ICD-10-CM | POA: Diagnosis not present

## 2020-01-10 HISTORY — DX: Weakness: R53.1

## 2020-01-10 NOTE — Assessment & Plan Note (Signed)
This is the only pain that has been persistent. Check xray of right hip to consider next steps.

## 2020-01-10 NOTE — Assessment & Plan Note (Addendum)
About 2 weeks ago she was walking with her husband and she feels she walked longer than she should have and when she got home she went out on her deck to water some plants and reached over the deck with her arm holding a hose and then suddenly she got a sharp pain all along the right side of her body from the top of her head to her feet. Her husband had to help her in the house. The sharpness subsided but the pain persisted for quite some time. She laid down and put heat on her right groing where the pain had stayed sharpest. Most of the pains have resolved but the pain in the groin is the most persistent but has come and gone. She notes a couple days prior she had stooped down to get something out of her refrigerator. And she lost her balance and fell. She was mildly hurt but nothing had persisted. During her episode of pain no numbness was associated or weakness although she notes her pain was so bad that she felt weak from the pain. If syncope recurs she is encouraged to consider a referral back to neurology.

## 2020-01-10 NOTE — Assessment & Plan Note (Signed)
And pain from head to toe, it was sharp and the sharp component was fleeting but pain and weakness persisted for the day. Most of the symptoms have resolved. Will check CT head and she will report if symptoms worsen.

## 2020-01-10 NOTE — Assessment & Plan Note (Signed)
Diazepam has helped her sleep more than anything else she has tried in the past. Encouraged good sleep hygiene such as dark, quiet room. No blue/green glowing lights such as computer screens in bedroom. No alcohol or stimulants in evening. Cut down on caffeine as able. Regular exercise is helpful but not just prior to bed time.

## 2020-01-10 NOTE — Progress Notes (Signed)
Virtual Visit via Video Note  I connected with Dawn Simmons on 01/10/20 at  9:00 AM EDT by a video enabled telemedicine application and verified that I am speaking with the correct person using two identifiers.  Location: Patient: home, patient and provider in visit Provider: home   I discussed the limitations of evaluation and management by telemedicine and the availability of in person appointments. The patient expressed understanding and agreed to proceed. Tawni Carnes CMA was able to get the patient set up in a video visit   Subjective:    Patient ID: Dawn Simmons, female    DOB: [birth year], 74 y.o.   MRN: [id number]  Chief Complaint  Patient presents with  . Fell on  Monday    Right Side groin pain    HPI Patient is in today for evaluation of an episode of right sided pain and weakness. About 2 weeks ago she was walking with her husband and she feels she walked longer than she should have and when she got home she went out on her deck to water some plants and reached over the deck with her arm holding a hose and then suddenly she got a sharp pain all along the right side of her body from the top of her head to her feet. Her husband had to help her in the house. The sharpness subsided but the pain persisted for quite some time. She laid down and put heat on her right groing where the pain had stayed sharpest. Most of the pains have resolved but the pain in the groin is the most persistent but has come and gone. She notes a couple days prior she had stooped down to get something out of her refrigerator. And she lost her balance and fell. She was mildly hurt but nothing had persisted. During her episode of pain no numbness was associated or weakness although she notes her pain was so bad that she felt weak from the pain.Denies palp/SOB/congestion/fevers/GI or GU c/o. Taking meds as prescribed  Past Medical History:  Diagnosis Date  . H/O fibromyalgia   . H/O insomnia   . H/O measles   .  H/O rubella   . H/O syncope   . History of chicken pox 09/15/2019  . History of palpitations   . Hx of mumps   . Mitral valve prolapse   . Palpitations   . Syncope   . Von Willebrand disease ([facility])     Past Surgical History:  Procedure Laterality Date  . NO PAST SURGERIES      Family History  Problem Relation Age of Onset  . Stroke Mother   . Emphysema Father        smoked  . Heart attack Maternal Grandfather   . Heart attack Paternal Grandfather   . Heart attack Maternal Uncle   . Heart failure Maternal Grandmother   . Heart failure Paternal Grandmother   . Seizures Neg Hx     Social History   Socioeconomic History  . Marital status: Married    Spouse name: tony  . Number of children: 4  . Years of education: 73  . Highest education level: Not on file  Occupational History  . Not on file  Tobacco Use  . Smoking status: Former Smoker    Packs/day: 0.50    Years: 10.00    Pack years: 5.00    Quit date: 04/27/1972    Years since quitting: 47.7  . Smokeless tobacco: Never Used  Vaping Use  .  Vaping Use: Never used  Substance and Sexual Activity  . Alcohol use: Yes    Alcohol/week: 0.0 standard drinks    Comment: occ 1 glass wine or beer  . Drug use: No  . Sexual activity: Not on file  Other Topics Concern  . Not on file  Social History Narrative   Lives at home with husband   Caffeine use- tea, 1 cup/day   Social Determinants of Health   Financial Resource Strain: Low Risk   . Difficulty of Paying Living Expenses: Not hard at all  Food Insecurity: No Food Insecurity  . Worried About Programme researcher, broadcasting/film/video in the Last Year: Never true  . Ran Out of Food in the Last Year: Never true  Transportation Needs: No Transportation Needs  . Lack of Transportation (Medical): No  . Lack of Transportation (Non-Medical): No  Physical Activity:   . Days of Exercise per Week: Not on file  . Minutes of Exercise per Session: Not on file  Stress:   . Feeling of Stress :  Not on file  Social Connections:   . Frequency of Communication with Friends and Family: Not on file  . Frequency of Social Gatherings with Friends and Family: Not on file  . Attends Religious Services: Not on file  . Active Member of Clubs or Organizations: Not on file  . Attends Banker Meetings: Not on file  . Marital Status: Not on file  Intimate Partner Violence:   . Fear of Current or Ex-Partner: Not on file  . Emotionally Abused: Not on file  . Physically Abused: Not on file  . Sexually Abused: Not on file    Outpatient Medications Prior to Visit  Medication Sig Dispense Refill  . Ascorbic Acid (VITAMIN C) 100 MG tablet Take 100 mg by mouth daily.    . Calcium-Magnesium 200-100 MG TABS Take 4 tablets by mouth daily. 2 tablets in the morning and 2 tablets in the evening    . diazepam (VALIUM) 5 MG tablet Take 5 mg by mouth every 6 (six) hours as needed for anxiety or muscle spasms. TAKE 1/2 TO 1 TABLET AT BED TIME as needed    . ergocalciferol (DRISDOL) 200 MCG/ML drops Take by mouth daily. PURE brand    . FLUoxetine (PROZAC) 10 MG capsule Take 2 capsules (20 mg total) by mouth every morning.    Marland Kitchen MAGNESIUM PO Take 200 mg by mouth at bedtime.    . Multiple Vitamin (MULTIVITAMIN) capsule Take 1 capsule by mouth daily.     No facility-administered medications prior to visit.    Allergies  Allergen Reactions  . Doxycycline Swelling  . Tetanus Toxoids Other (See Comments)    PAIN IN NECK AND FACE  . Flexeril [Cyclobenzaprine] Other (See Comments)    Review of Systems  Constitutional: Positive for malaise/fatigue. Negative for fever.  HENT: Negative for congestion.   Eyes: Negative for blurred vision.  Respiratory: Negative for shortness of breath.   Cardiovascular: Negative for chest pain, palpitations and leg swelling.  Gastrointestinal: Negative for abdominal pain, blood in stool and nausea.  Genitourinary: Negative for dysuria and frequency.    Musculoskeletal: Positive for back pain, joint pain, myalgias and neck pain. Negative for falls.  Skin: Negative for rash.  Neurological: Positive for focal weakness, loss of consciousness and headaches. Negative for dizziness.  Endo/Heme/Allergies: Negative for environmental allergies.  Psychiatric/Behavioral: Positive for depression. The patient is nervous/anxious.        Objective:  Physical Exam Constitutional:      Appearance: Normal appearance. She is not ill-appearing.  HENT:     Head: Normocephalic and atraumatic.     Right Ear: External ear normal.     Left Ear: External ear normal.     Nose: Nose normal.  Eyes:     General:        Right eye: No discharge.        Left eye: No discharge.  Pulmonary:     Effort: Pulmonary effort is normal.  Neurological:     Mental Status: She is alert and oriented to person, place, and time.  Psychiatric:        Behavior: Behavior normal.     Ht 5\' 5"  (1.651 m)   Wt 125 lb (56.7 kg)   BMI 20.80 kg/m  Wt Readings from Last 3 Encounters:  01/10/20 125 lb (56.7 kg)  11/18/19 125 lb 12.8 oz (57.1 kg)  10/31/19 126 lb 3.2 oz (57.2 kg)    Diabetic Foot Exam - Simple   No data filed     Lab Results  Component Value Date   WBC 4.3 10/31/2019   HGB 12.7 10/31/2019   HCT 37.7 10/31/2019   PLT 280.0 10/31/2019   GLUCOSE 86 09/12/2019   ALT 15 09/12/2019   AST 23 09/12/2019   NA 139 09/12/2019   K 4.3 09/12/2019   CL 105 09/12/2019   CREATININE 0.93 09/12/2019   BUN 14 09/12/2019   CO2 25 09/12/2019   TSH 2.23 09/12/2019    Lab Results  Component Value Date   TSH 2.23 09/12/2019   Lab Results  Component Value Date   WBC 4.3 10/31/2019   HGB 12.7 10/31/2019   HCT 37.7 10/31/2019   MCV 93.9 10/31/2019   PLT 280.0 10/31/2019   Lab Results  Component Value Date   NA 139 09/12/2019   K 4.3 09/12/2019   CO2 25 09/12/2019   GLUCOSE 86 09/12/2019   BUN 14 09/12/2019   CREATININE 0.93 09/12/2019   BILITOT 0.4  09/12/2019   ALKPHOS 54 09/12/2019   AST 23 09/12/2019   ALT 15 09/12/2019   PROT 6.5 09/12/2019   ALBUMIN 4.4 09/12/2019   CALCIUM 9.5 09/12/2019   GFR 59.13 (L) 09/12/2019   No results found for: CHOL No results found for: HDL No results found for: LDLCALC No results found for: TRIG No results found for: CHOLHDL No results found for: [id number]     Assessment & Plan:   Problem List Items Addressed This Visit    H/O syncope    About 2 weeks ago she was walking with her husband and she feels she walked longer than she should have and when she got home she went out on her deck to water some plants and reached over the deck with her arm holding a hose and then suddenly she got a sharp pain all along the right side of her body from the top of her head to her feet. Her husband had to help her in the house. The sharpness subsided but the pain persisted for quite some time. She laid down and put heat on her right groing where the pain had stayed sharpest. Most of the pains have resolved but the pain in the groin is the most persistent but has come and gone. She notes a couple days prior she had stooped down to get something out of her refrigerator. And she lost her balance and fell. She was mildly  hurt but nothing had persisted. During her episode of pain no numbness was associated or weakness although she notes her pain was so bad that she felt weak from the pain. If syncope recurs she is encouraged to consider a referral back to neurology.       Insomnia    Diazepam has helped her sleep more than anything else she has tried in the past. Encouraged good sleep hygiene such as dark, quiet room. No blue/green glowing lights such as computer screens in bedroom. No alcohol or stimulants in evening. Cut down on caffeine as able. Regular exercise is helpful but not just prior to bed time.       PTSD (post-traumatic stress disorder)    She continues to follow with [facility] of behavioral health and  has been referred to [facility] for psychiatric care but has not seen them yet.      Right groin pain    This is the only pain that has been persistent. Check xray of right hip to consider next steps.       Relevant Orders   CBC with Differential/Platelet   Comprehensive metabolic panel   Sedimentation rate   Right sided weakness    And pain from head to toe, it was sharp and the sharp component was fleeting but pain and weakness persisted for the day. Most of the symptoms have resolved. Will check CT head and she will report if symptoms worsen.       Relevant Orders   CT Head Wo Contrast    Other Visit Diagnoses    Right hip pain    -  Primary   Relevant Orders   DG Hip Unilat W OR W/O Pelvis 2-3 Views Right      I am having Dawn Simmons maintain her multivitamin, diazepam, Calcium-Magnesium, vitamin C, ergocalciferol, MAGNESIUM PO, and FLUoxetine.  No orders of the defined types were placed in this encounter.    I discussed the assessment and treatment plan with the patient. The patient was provided an opportunity to ask questions and all were answered. The patient agreed with the plan and demonstrated an understanding of the instructions.   The patient was advised to call back or seek an in-person evaluation if the symptoms worsen or if the condition fails to improve as anticipated.  I provided 35 minutes of non-face-to-face time during this encounter.   Danise Edge, MD

## 2020-01-10 NOTE — Assessment & Plan Note (Addendum)
She continues to follow with [facility] of behavioral health and has been referred to [facility] for psychiatric care but has not seen them yet.

## 2020-01-11 LAB — CBC WITH DIFFERENTIAL/PLATELET
Absolute Monocytes: 428 cells/uL (ref 200–950)
Basophils Absolute: 32 cells/uL (ref 0–200)
Basophils Relative: 0.7 %
Eosinophils Absolute: 198 cells/uL (ref 15–500)
Eosinophils Relative: 4.4 %
HCT: 37.7 % (ref 35.0–45.0)
Hemoglobin: 12.8 g/dL (ref 11.7–15.5)
Lymphs Abs: 1197 cells/uL (ref 850–3900)
MCH: 31.3 pg (ref 27.0–33.0)
MCHC: 34 g/dL (ref 32.0–36.0)
MCV: 92.2 fL (ref 80.0–100.0)
MPV: 9.6 fL (ref 7.5–12.5)
Monocytes Relative: 9.5 %
Neutro Abs: 2646 cells/uL (ref 1500–7800)
Neutrophils Relative %: 58.8 %
Platelets: 297 10*3/uL (ref 140–400)
RBC: 4.09 10*6/uL (ref 3.80–5.10)
RDW: 12.7 % (ref 11.0–15.0)
Total Lymphocyte: 26.6 %
WBC: 4.5 10*3/uL (ref 3.8–10.8)

## 2020-01-11 LAB — COMPREHENSIVE METABOLIC PANEL
AG Ratio: 2 (calc) (ref 1.0–2.5)
ALT: 13 U/L (ref 6–29)
AST: 20 U/L (ref 10–35)
Albumin: 4.4 g/dL (ref 3.6–5.1)
Alkaline phosphatase (APISO): 51 U/L (ref 37–153)
BUN/Creatinine Ratio: 13 (calc) (ref 6–22)
BUN: 13 mg/dL (ref 7–25)
CO2: 24 mmol/L (ref 20–32)
Calcium: 9.9 mg/dL (ref 8.6–10.4)
Chloride: 105 mmol/L (ref 98–110)
Creat: 0.97 mg/dL — ABNORMAL HIGH (ref 0.60–0.93)
Globulin: 2.2 g/dL (calc) (ref 1.9–3.7)
Glucose, Bld: 91 mg/dL (ref 65–99)
Potassium: 4.2 mmol/L (ref 3.5–5.3)
Sodium: 138 mmol/L (ref 135–146)
Total Bilirubin: 0.4 mg/dL (ref 0.2–1.2)
Total Protein: 6.6 g/dL (ref 6.1–8.1)

## 2020-01-11 LAB — SEDIMENTATION RATE: Sed Rate: 2 mm/h (ref 0–30)

## 2020-01-13 ENCOUNTER — Encounter: Payer: Medicare Other | Admitting: Rehabilitative and Restorative Service Providers"

## 2020-01-15 ENCOUNTER — Ambulatory Visit (INDEPENDENT_AMBULATORY_CARE_PROVIDER_SITE_OTHER): Payer: Medicare Other | Admitting: Rehabilitative and Restorative Service Providers"

## 2020-01-15 ENCOUNTER — Other Ambulatory Visit: Payer: Self-pay

## 2020-01-15 ENCOUNTER — Encounter: Payer: Medicare Other | Admitting: Rehabilitative and Restorative Service Providers"

## 2020-01-15 ENCOUNTER — Encounter: Payer: Self-pay | Admitting: Rehabilitative and Restorative Service Providers"

## 2020-01-15 DIAGNOSIS — G8929 Other chronic pain: Secondary | ICD-10-CM

## 2020-01-15 DIAGNOSIS — M25512 Pain in left shoulder: Secondary | ICD-10-CM | POA: Diagnosis not present

## 2020-01-15 DIAGNOSIS — M6281 Muscle weakness (generalized): Secondary | ICD-10-CM | POA: Diagnosis not present

## 2020-01-15 DIAGNOSIS — M25612 Stiffness of left shoulder, not elsewhere classified: Secondary | ICD-10-CM

## 2020-01-15 NOTE — Therapy (Signed)
[facility] [address] [place], [place], [address] Phone: [phone]   Fax:  [phone]  Physical Therapy Treatment  Patient Details  Name: Dawn Simmons MRN: [id number] Date of Birth: 1945/07/28 Referring Provider (PT): Rodolph Bong MD   Encounter Date: 01/15/2020   PT End of Session - 01/15/20 1314    Visit Number 11    Number of Visits 20    Date for PT Re-Evaluation 02/07/20    Progress Note Due on Visit 20    PT Start Time 1150    PT Stop Time 1235    PT Time Calculation (min) 45 min    Activity Tolerance Patient tolerated treatment well;Patient limited by fatigue    Behavior During Therapy [facility] for tasks assessed/performed           Past Medical History:  Diagnosis Date  . H/O fibromyalgia   . H/O insomnia   . H/O measles   . H/O rubella   . H/O syncope   . History of chicken pox 09/15/2019  . History of palpitations   . Hx of mumps   . Mitral valve prolapse   . Palpitations   . Syncope   . Von Willebrand disease (HCC)     Past Surgical History:  Procedure Laterality Date  . NO PAST SURGERIES      There were no vitals filed for this visit.   Subjective Assessment - 01/15/20 1310    Subjective [name] was able to weed in her garden without increased L shoulder pain.    Patient is accompained by: Family member    Limitations Other (comment)    Patient Stated Goals Be able to use the L shoulder without pain.    Currently in Pain? Yes    Pain Score 4     Pain Location Shoulder    Pain Orientation Left    Pain Descriptors / Indicators Aching;Sore;Tightness    Pain Type Chronic pain    Pain Onset More than a month ago    Pain Frequency Constant    Aggravating Factors  Reaching overhead and behind the back    Pain Relieving Factors Meds    Effect of Pain on Daily Activities Although improving, participation with UE ADLs is improving.    Multiple Pain Sites No                              [facility] Adult PT Treatment/Exercise - 01/15/20 0001      Exercises   Exercises Shoulder      Shoulder Exercises: Supine   Protraction Strengthening;Both;20 reps;Weights   3#   Protraction Weight (lbs) 3#    Internal Rotation 10 reps;Left   10 seconds     Shoulder Exercises: Sidelying   External Rotation Strengthening;Left;10 reps;Weights   2 sets of 10  1# also 5X with 2#   External Rotation Weight (lbs) 1# and 2#      Shoulder Exercises: Standing   External Rotation Strengthening;Left;10 reps;Theraband    Theraband Level (Shoulder External Rotation) Level 2 (Red)    Internal Rotation Strengthening;Left;10 reps;Theraband    Theraband Level (Shoulder Internal Rotation) Level 2 (Red)    Row Strengthening;Both;10 reps;Theraband    Theraband Level (Shoulder Row) Level 2 (Red)    Retraction Strengthening;10 reps   5 seconds   Other Standing Exercises Biceps curls 20X 3# slow eccentrics  PT Education - 01/15/20 1312    Education Details Reviewed and updated HEP with additional strength activities.    Person(s) Educated Patient;Spouse    Methods Explanation;Demonstration;Tactile cues;Verbal cues    Comprehension Returned demonstration;Need further instruction;Tactile cues required;Verbalized understanding;Verbal cues required               PT Long Term Goals - 01/15/20 1313      PT LONG TERM GOAL #1   Title [name] will report L shoulder pain consistently 0-3/10 on the Numeric Pain Rating Scale.    Baseline Can be 4-5/10 (were 7+/10)    Status On-going      PT LONG TERM GOAL #2   Title Improve L shoulder IR to 50 and Horizontal adduction AROM to 40 degrees.    Baseline IR is 40 and HA 40    Status On-going      PT LONG TERM GOAL #3   Title [name] will report an 80% improvement in self-reported function by DC.    Baseline 50% improved.    Status On-going      PT LONG TERM GOAL #4   Title [name] will be independent with her HEP at DC.     Baseline Husband is very involved in HEP compliance and ensuring correct technique.    Status On-going                 Plan - 01/15/20 1314    Clinical Impression Statement Orianna is making progress with her strength and self-reported function.  She was able to weed earlier this week (one of her favorite activities) without increased shoulder pain.  Reaching, overhead and behind the back function are still limited and sleeping (although better) still requires medication.  Continue strength and functional progressions to meet long-term goals.    Examination-Activity Limitations Bathing;Dressing;Hygiene/Grooming;Carry;Reach Overhead    Examination-Participation Restrictions Interpersonal Relationship;Occupation    Stability/Clinical Decision Making Stable/Uncomplicated    Rehab Potential Good    PT Frequency Other (comment)   1-2X/week   PT Duration 8 weeks    PT Treatment/Interventions ADLs/Self Care Home Management;Moist Heat;Cryotherapy;Therapeutic activities;Therapeutic exercise;Neuromuscular re-education;Patient/family education;Manual techniques;Passive range of motion;Joint Manipulations    PT Next Visit Plan Continued strengthening/endurance intervention for Lt shoulder    PT Home Exercise Plan Access Code: [email or web address]    Consulted and Agree with Plan of Care Family member/caregiver;Patient    Family Member Consulted Husband           Patient will benefit from skilled therapeutic intervention in order to improve the following deficits and impairments:  Decreased activity tolerance, Decreased endurance, Decreased range of motion, Decreased mobility, Impaired flexibility, Impaired UE functional use, Pain  Visit Diagnosis: Chronic left shoulder pain  Stiffness of left shoulder, not elsewhere classified  Muscle weakness (generalized)     Problem List Patient Active Problem List   Diagnosis Date Noted  . Right groin pain 01/10/2020  . Right sided weakness 01/10/2020  .  Left shoulder pain 10/31/2019  . Laryngitis 10/31/2019  . Leukopenia 09/15/2019  . PTSD (post-traumatic stress disorder) 09/15/2019  . History of chicken pox 09/15/2019  . Von Willebrand disease (HCC)   . Upper airway cough syndrome 09/06/2018  . Fibromyalgia 06/01/2015  . Insomnia 06/01/2015  . H/O syncope   . Palpitations   . Mitral valve prolapse     Cherlyn Cushing PT, MPT 01/15/2020, 1:17 PM  [facility] [address] [place], [place], [address] Phone: [phone]   Fax:  [phone]  Name:  Danila Eddie MRN: [id number] Date of Birth: 07/20/1945

## 2020-01-16 ENCOUNTER — Ambulatory Visit (INDEPENDENT_AMBULATORY_CARE_PROVIDER_SITE_OTHER): Payer: Medicare Other | Admitting: Psychology

## 2020-01-16 DIAGNOSIS — F4310 Post-traumatic stress disorder, unspecified: Secondary | ICD-10-CM | POA: Diagnosis not present

## 2020-01-20 ENCOUNTER — Other Ambulatory Visit: Payer: Self-pay

## 2020-01-20 ENCOUNTER — Ambulatory Visit (INDEPENDENT_AMBULATORY_CARE_PROVIDER_SITE_OTHER): Payer: Medicare Other | Admitting: Psychiatry

## 2020-01-20 ENCOUNTER — Encounter: Payer: Self-pay | Admitting: Psychiatry

## 2020-01-20 VITALS — BP 122/78 | HR 92 | Ht 65.0 in | Wt 125.0 lb

## 2020-01-20 DIAGNOSIS — F4310 Post-traumatic stress disorder, unspecified: Secondary | ICD-10-CM

## 2020-01-20 DIAGNOSIS — G4700 Insomnia, unspecified: Secondary | ICD-10-CM | POA: Diagnosis not present

## 2020-01-20 MED ORDER — FLUOXETINE HCL 10 MG PO CAPS: 20.0000 mg | ORAL_CAPSULE | Freq: Every morning | ORAL | 0 refills | Status: DC

## 2020-01-20 MED ORDER — DIAZEPAM 5 MG PO TABS: 5.0000 mg | ORAL_TABLET | Freq: Every day | ORAL | 0 refills | Status: DC
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
## 2020-01-23 ENCOUNTER — Ambulatory Visit (INDEPENDENT_AMBULATORY_CARE_PROVIDER_SITE_OTHER): Payer: Medicare Other | Admitting: Psychology

## 2020-01-23 ENCOUNTER — Telehealth: Payer: Self-pay

## 2020-01-23 DIAGNOSIS — F4310 Post-traumatic stress disorder, unspecified: Secondary | ICD-10-CM

## 2020-01-23 NOTE — Telephone Encounter (Signed)
Prior authorization submitted for DIAZEPAM 5 MG TABLETS #90/90 DAY, approved with [employer] Medicare Part D ID# [id number] effective 04/12/2019-02/21/2020.   Her PA is only approved for 1 month from today, I believe it's related to her age and Medicare is only approving for the short term use. Will inform Shanda Bumps.

## 2020-01-24 ENCOUNTER — Encounter: Payer: Medicare Other | Admitting: Rehabilitative and Restorative Service Providers"

## 2020-01-27 ENCOUNTER — Other Ambulatory Visit: Payer: Self-pay

## 2020-01-27 ENCOUNTER — Ambulatory Visit (INDEPENDENT_AMBULATORY_CARE_PROVIDER_SITE_OTHER): Payer: Medicare Other | Admitting: Rehabilitative and Restorative Service Providers"

## 2020-01-27 ENCOUNTER — Encounter: Payer: Self-pay | Admitting: Rehabilitative and Restorative Service Providers"

## 2020-01-27 DIAGNOSIS — M25612 Stiffness of left shoulder, not elsewhere classified: Secondary | ICD-10-CM | POA: Diagnosis not present

## 2020-01-27 DIAGNOSIS — M6281 Muscle weakness (generalized): Secondary | ICD-10-CM | POA: Diagnosis not present

## 2020-01-27 DIAGNOSIS — M25512 Pain in left shoulder: Secondary | ICD-10-CM

## 2020-01-27 DIAGNOSIS — G8929 Other chronic pain: Secondary | ICD-10-CM

## 2020-01-27 NOTE — Therapy (Addendum)
[facility] Physical Therapy [address] [place], [place], [address] Phone: [phone]   Fax:  [phone]  Physical Therapy Treatment/DC  Patient Details  Name: Dawn Simmons MRN: [id number] Date of Birth: 01-23-1946 Referring Provider (PT): Gregor Hams MD   Encounter Date: 01/27/2020   PT End of Session - 01/27/20 1639    Visit Number 12    Number of Visits 20    Date for PT Re-Evaluation 02/07/20    Progress Note Due on Visit 20    PT Start Time 1304    PT Stop Time 1345    PT Time Calculation (min) 41 min    Activity Tolerance Patient tolerated treatment well;Patient limited by fatigue    Behavior During Therapy [facility] for tasks assessed/performed           Past Medical History:  Diagnosis Date  . H/O fibromyalgia   . H/O insomnia   . H/O measles   . H/O rubella   . H/O syncope   . History of chicken pox 09/15/2019  . History of palpitations   . Hx of mumps   . Mitral valve prolapse   . Palpitations   . Syncope   . Von Willebrand disease ([place])     Past Surgical History:  Procedure Laterality Date  . NO PAST SURGERIES      There were no vitals filed for this visit.   Subjective Assessment - 01/27/20 1637    Subjective Dawn Simmons says this is the first good day she has had in months.    Patient is accompained by: Family member    Limitations Other (comment)    Patient Stated Goals Be able to use the L shoulder without pain.    Currently in Pain? Yes    Pain Score 3     Pain Location Shoulder    Pain Orientation Left    Pain Descriptors / Indicators Aching;Sore    Pain Type Chronic pain    Pain Radiating Towards Upper trapezius    Pain Onset More than a month ago    Pain Frequency Intermittent    Aggravating Factors  Overuse    Pain Relieving Factors Meds, although she wants to cut back    Effect of Pain on Daily Activities Strength and endurance limit participation in house chores and yard work.  Sleep requires meds.                              [facility] PT Treatment/Exercise - 01/27/20 0001      Neuro Re-ed    Neuro Re-ed Details  Heel to toe balance 5X 20 seconds; feet together eyes closed/head side to side      Exercises   Exercises Shoulder      Shoulder Exercises: Supine   Protraction Strengthening;Both;20 reps;Weights   3#   Protraction Weight (lbs) 3#    Internal Rotation 10 reps;Left   10 seconds     Shoulder Exercises: Sidelying   External Rotation Strengthening;Left;10 reps;Weights   2 sets of 10  1# also 5X with 2#   External Rotation Weight (lbs) 1# and 2#      Shoulder Exercises: Standing   External Rotation Strengthening;Left;10 reps;Theraband    Theraband Level (Shoulder External Rotation) Level 2 (Red)    Internal Rotation Strengthening;Left;10 reps;Theraband    Theraband Level (Shoulder Internal Rotation) Level 2 (Red)    Row Strengthening;Both;10 reps;Theraband    Theraband Level (Shoulder Row)  Level 2 (Red)    Retraction Strengthening;10 reps   5 seconds   Other Standing Exercises Biceps curls 20X 3# slow eccentrics                  PT Education - 01/27/20 1639    Education Details Reviewed and progressed home strengthening program.    Person(s) Educated Patient;Spouse    Methods Explanation;Demonstration;Tactile cues;Verbal cues;Handout    Comprehension Verbal cues required;Need further instruction;Returned demonstration;Verbalized understanding;Tactile cues required               PT Long Term Goals - 01/27/20 1639      PT LONG TERM GOAL #1   Title [place] will report L shoulder pain consistently 0-3/10 on the Numeric Pain Rating Scale.    Baseline Can be 4-5/10 (were 7+/10)    Status On-going      PT LONG TERM GOAL #2   Title Improve L shoulder IR to 50 and Horizontal adduction AROM to 40 degrees.    Baseline IR is 40 and HA 40    Status On-going      PT LONG TERM GOAL #3   Title [place] will report an 80% improvement in  self-reported function by DC.    Baseline 50% improved.    Status On-going      PT LONG TERM GOAL #4   Title Kaula will be independent with her HEP at DC.    Baseline Husband is very involved in HEP compliance and ensuring correct technique.    Status On-going                 Plan - 01/27/20 1640    Clinical Impression Statement Tanya notes this is the best she has felt in a long time.  Strength is still limited as is her endurance with house chores and yard work.  Continue strength progressions with RA late October to assess progress objectively.    Examination-Activity Limitations Bathing;Dressing;Hygiene/Grooming;Carry;Reach Overhead    Examination-Participation Restrictions Interpersonal Relationship;Occupation    Stability/Clinical Decision Making Stable/Uncomplicated    Rehab Potential Good    PT Frequency Other (comment)   1-2X/week   PT Duration 8 weeks    PT Treatment/Interventions ADLs/Self Care Home Management;Moist Heat;Cryotherapy;Therapeutic activities;Therapeutic exercise;Neuromuscular re-education;Patient/family education;Manual techniques;Passive range of motion;Joint Manipulations    PT Next Visit Plan Continued strengthening/endurance intervention for Lt shoulder    PT Home Exercise Plan Access Code: [email or web address]  Access Code: [email or web address]    Consulted and Agree with Plan of Care Family member/caregiver;Patient    Family Member Consulted Husband           Patient will benefit from skilled therapeutic intervention in order to improve the following deficits and impairments:  Decreased activity tolerance, Decreased endurance, Decreased range of motion, Decreased mobility, Impaired flexibility, Impaired UE functional use, Pain  Visit Diagnosis: Chronic left shoulder pain  Stiffness of left shoulder, not elsewhere classified  Muscle weakness (generalized)     Problem List Patient Active Problem List   Diagnosis Date Noted  . Right groin pain 01/10/2020   . Right sided weakness 01/10/2020  . Left shoulder pain 10/31/2019  . Laryngitis 10/31/2019  . Leukopenia 09/15/2019  . PTSD (post-traumatic stress disorder) 09/15/2019  . History of chicken pox 09/15/2019  . Von Willebrand disease ([place])   . Upper airway cough syndrome 09/06/2018  . Fibromyalgia 06/01/2015  . Insomnia 06/01/2015  . H/O syncope   . Palpitations   . Mitral valve prolapse     Joie Bimler  [employer] PT, MPT 01/27/2020, 4:42 PM  [facility] Physical Therapy [address] [place], [place], [address] Phone: [phone]   Fax:  [phone]  Name: Imogean Ciampa MRN: [id number] Date of Birth: 07/26/1945   PHYSICAL THERAPY DISCHARGE SUMMARY  Visits from Start of Care: 12  Current functional level related to goals / functional outcomes: See note   Remaining deficits: See note   Education / Equipment: HEP Plan:                                                    Patient goals were partially met. Patient is being discharged due to being pleased with the current functional level.  ?????

## 2020-01-27 NOTE — Patient Instructions (Signed)
Access Code: [email or web address] URL: [email or web address] Date: 01/27/2020 Prepared by: Pauletta Browns  Exercises Tandem Stance - 2 x daily - 7 x weekly - 1 sets - 5 reps - 20 hold Scapular Retraction with Resistance - 2 x daily - 7 x weekly - 1 sets - 20 reps - 3 seconds hold

## 2020-01-30 ENCOUNTER — Ambulatory Visit: Payer: Medicare Other | Admitting: Psychology

## 2020-01-31 ENCOUNTER — Encounter: Payer: Medicare Other | Admitting: Rehabilitative and Restorative Service Providers"

## 2020-02-04 ENCOUNTER — Ambulatory Visit (INDEPENDENT_AMBULATORY_CARE_PROVIDER_SITE_OTHER): Payer: Medicare Other | Admitting: Family Medicine

## 2020-02-04 ENCOUNTER — Encounter: Payer: Self-pay | Admitting: Family Medicine

## 2020-02-04 ENCOUNTER — Other Ambulatory Visit: Payer: Self-pay

## 2020-02-04 VITALS — BP 102/60 | HR 66 | Temp 97.4°F | Resp 16 | Wt 125.4 lb

## 2020-02-04 DIAGNOSIS — U099 Post covid-19 condition, unspecified: Secondary | ICD-10-CM | POA: Diagnosis not present

## 2020-02-04 DIAGNOSIS — F4310 Post-traumatic stress disorder, unspecified: Secondary | ICD-10-CM

## 2020-02-04 DIAGNOSIS — Z23 Encounter for immunization: Secondary | ICD-10-CM

## 2020-02-04 DIAGNOSIS — M797 Fibromyalgia: Secondary | ICD-10-CM | POA: Diagnosis not present

## 2020-02-04 NOTE — Assessment & Plan Note (Addendum)
She is noting not feeling well or doing well on the virtual platform for her mental health concerns. She is not interested in switching providers presently but she will consider if things worsen.

## 2020-02-04 NOTE — Patient Instructions (Addendum)
Covid booster end of February, the [employer] gives them.   Shingrix is the new shingles shot, 2 shots over 2-6 months can start 1 month after last flu Shot at pharmacy  Post-Traumatic Stress Disorder, Adult Post-traumatic stress disorder (PTSD) is a mental health disorder that can occur after a traumatic event, such as a threat to life, serious injury, or sexual violence. Sometimes, PTSD can occur in people who hear about trauma that occurs to a close family member or friend. PTSD can happen to anyone at any age. What are the causes? The condition may be caused by experiencing a traumatic event. What increases the risk? This condition is more likely to occur in:  Engineer, mining.  People who are in circumstances where their lives are threatened.  People who have been the victim of, or witness to, a traumatic event, such as: ? Domestic violence. ? Physical or sexual abuse. ? Rape. ? A terrorist act or gun violence. ? Natural disasters. ? Accidents involving serious injury. What are the signs or symptoms? PTSD symptoms may start soon after a frightening event or months or years later. Symptoms last at least one month and tend to disrupt relationships, work, and daily activities. Symptoms of PTSD can be grouped into several categories. Intrusive symptoms This is when you re-experience the physical and emotional sensations of the traumatic event through one or more of the following ways:  Having upsetting dreams.  Feeling fear, horror, intense sadness, or anger in response to a reminder of the trauma.  Having unwanted upsetting memories while awake.  Having physical reactions triggered by reminders of the trauma, such as increased heart rate, shortness of breath, sweating, and shaking.  Having flashbacks, or feeling like you are going through the event again. Avoidance symptoms This is when you avoid anything that reminds you of the trauma.  Symptoms may also include:  Losing interest or not participating in daily activities.  Feeling disconnected from or avoiding other people.  Isolating yourself. Increased arousal symptoms You may have physical or emotional reactions triggered by your environment. Symptoms may include:  Being easily startled.  Behaving in a careless or self-destructive way.  Becoming easily irritated.  Feeling worried and nervous.  Having trouble concentrating.  Yelling at or hitting other people or objects.  Having trouble sleeping. Negative mood and thoughts  Believing that you or others are bad.  Feeling fear, horror, anger, sadness, guilt, or shame regularly.  Not being able to remember certain parts of the traumatic event.  Blaming yourself or others for the trauma.  Being unable to experience positive emotions, such as happiness or love. How is this diagnosed? PTSD is diagnosed through an assessment by a mental health professional. Bonita Quin will be asked questions about your symptoms. How is this treated? Treatment for this condition may include any of the following or a combination:  Taking medicines to reduce PTSD symptoms.  Having counseling with a mental health professional or therapist who is experienced in treating PTSD.  Doing eye movement desensitization and reprocessing therapy (EMDR). This type of therapy occurs with a specialized therapist. If you have other mental health concerns, these conditions will also be treated. Follow these instructions at home: Lifestyle  Find a support group in your community. Groups are often available for [employer] veterans, trauma victims, and family members or caregivers.  Try to get 7-9 hours of sleep each night. To help with sleep: ? Keep your bedroom cool and dark. ? Do not  eat a heavy meal within 1 hour of bedtime. ? Do not drink alcohol or caffeinated drinks before bed. ? Avoid screen time, such as television, computers, tablets, or  mobile phones, before bed.  Do not use illegal drugs.  Contact a local organization to find out if you are eligible for a service dog. Activity  Exercise regularly. Try to do at least 30 minutes of physical activity most days of the week.  Practice self-calming through: ? Breathing exercises. ? Meditation. ? Yoga. ? Listening to quiet music.  Do not isolate yourself. Make connections with other people.  Consider volunteering. Volunteering can help you feel more connected. Eating and drinking  Do not drink alcohol if: ? Your health care provider tells you not to drink. ? You are pregnant, may be pregnant, or are planning to become pregnant.  If you drink alcohol: ? Limit how much you use to:  0-1 drink a day for women.  0-2 drinks a day for men. ? Be aware of how much alcohol is in your drink. In the [place] one drink equals one 12 oz bottle of beer (355 mL), one 5 oz glass of wine (148 mL), or one 1 oz glass of hard liquor (44 mL). General instructions  Take steps to help yourself feel safer at home, such as by installing a security system.  Work with a health care provider or therapist to help manage your symptoms.  Take over-the-counter and prescription medicines as told by your health care provider.  Let others know that you have PTSD and the things that may trigger symptoms. This can protect you and help them understand you better.  If your PTSD is affecting your marriage or family, seek help from a family therapist.  Make sure to let all of your health care providers know you have PTSD. This is especially important if you are having surgery or need to be admitted to the hospital.  Keep all follow-up visits as told by your health care provider. This is important. Contact a health care provider if:  Your symptoms do not get better.  You are feeling overwhelmed by your symptoms. Get help right away if:  You have thoughts of hurting yourself or others. If you  ever feel like you may hurt yourself or others, or have thoughts about taking your own life, get help right away. You can go to your nearest emergency department or call:  Your local emergency services (911 in the [place]  A suicide crisis helpline, such as the National Suicide Prevention Lifeline at 760-620-4743. This is open 24 hours a day. Summary  Post-traumatic stress disorder (PTSD) is a mental health disorder that can occur after a traumatic event.  Treatment for PTSD may include medicines, counseling, eye movement desensitization and reprocessing therapy (EMDR), or a combination of therapies.  Find a support group in your community.  Get help right away if you have thoughts of hurting yourself or others. This information is not intended to replace advice given to you by your health care provider. Make sure you discuss any questions you have with your health care provider. Document Revised: 06/07/2018 Document Reviewed: 06/07/2018 Elsevier Patient Education  2020 [employer].

## 2020-02-05 ENCOUNTER — Telehealth: Payer: Self-pay | Admitting: Psychiatry

## 2020-02-05 DIAGNOSIS — Z8616 Personal history of COVID-19: Secondary | ICD-10-CM | POA: Insufficient documentation

## 2020-02-05 DIAGNOSIS — U099 Post covid-19 condition, unspecified: Secondary | ICD-10-CM | POA: Insufficient documentation

## 2020-02-05 HISTORY — DX: Personal history of COVID-19: Z86.16

## 2020-02-05 NOTE — Assessment & Plan Note (Signed)
She is given flu shot regular strength today. Last year she reports she got her shot in two smaller pediatric doses and she tolerated it well.

## 2020-02-05 NOTE — Telephone Encounter (Signed)
Pt called and left a message that she wanted to give an update to on her visit with dr. Remer Macho. Please give her a call at [phone]

## 2020-02-05 NOTE — Progress Notes (Signed)
Subjective:    Patient ID: Dawn Simmons, female    DOB: 06-May-1945, 74 y.o.   MRN: [id number]  Chief Complaint  Patient presents with  . Follow-up    HPI Patient is in today for follow up on chronic medical concerns. She has not had another episode of sharp pain and weakness like she did prior to her last visit. She continues to struggle with fatigue, myalgias, anxiety, depression and insomnia. No recent febrile illness or hospitalizations. Denies CP/palp/SOB/HA/congestion/fevers/GI or GU c/o. Taking meds as prescribed  Past Medical History:  Diagnosis Date  . H/O fibromyalgia   . H/O insomnia   . H/O measles   . H/O rubella   . H/O syncope   . History of chicken pox 09/15/2019  . History of palpitations   . Hx of mumps   . Mitral valve prolapse   . Palpitations   . Syncope   . Von Willebrand disease ([facility])     Past Surgical History:  Procedure Laterality Date  . NO PAST SURGERIES      Family History  Problem Relation Age of Onset  . Stroke Mother   . Emphysema Father        smoked  . Alcohol abuse Father   . Heart attack Maternal Grandfather   . Heart attack Paternal Grandfather   . Heart attack Maternal Uncle   . Heart failure Maternal Grandmother   . Heart failure Paternal Grandmother   . Seizures Neg Hx     Social History   Socioeconomic History  . Marital status: Married    Spouse name: tony  . Number of children: 4  . Years of education: 40  . Highest education level: Not on file  Occupational History  . Not on file  Tobacco Use  . Smoking status: Former Smoker    Packs/day: 0.50    Years: 10.00    Pack years: 5.00    Quit date: 04/27/1972    Years since quitting: 47.8  . Smokeless tobacco: Never Used  Vaping Use  . Vaping Use: Never used  Substance and Sexual Activity  . Alcohol use: Yes    Alcohol/week: 0.0 standard drinks    Comment: occ 1 glass wine or beer  . Drug use: No  . Sexual activity: Not on file  Other Topics Concern  . Not  on file  Social History Narrative   Lives at home with husband   Caffeine use- tea, 1 cup/day   Social Determinants of Health   Financial Resource Strain: Low Risk   . Difficulty of Paying Living Expenses: Not hard at all  Food Insecurity: No Food Insecurity  . Worried About Programme researcher, broadcasting/film/video in the Last Year: Never true  . Ran Out of Food in the Last Year: Never true  Transportation Needs: No Transportation Needs  . Lack of Transportation (Medical): No  . Lack of Transportation (Non-Medical): No  Physical Activity:   . Days of Exercise per Week: Not on file  . Minutes of Exercise per Session: Not on file  Stress:   . Feeling of Stress : Not on file  Social Connections:   . Frequency of Communication with Friends and Family: Not on file  . Frequency of Social Gatherings with Friends and Family: Not on file  . Attends Religious Services: Not on file  . Active Member of Clubs or Organizations: Not on file  . Attends Banker Meetings: Not on file  . Marital Status: Not on  file  Intimate Partner Violence:   . Fear of Current or Ex-Partner: Not on file  . Emotionally Abused: Not on file  . Physically Abused: Not on file  . Sexually Abused: Not on file    Outpatient Medications Prior to Visit  Medication Sig Dispense Refill  . Ascorbic Acid (VITAMIN C) 100 MG tablet Take 100 mg by mouth daily.    . Calcium-Magnesium 200-100 MG TABS Take 4 tablets by mouth daily. 2 tablets in the morning and 2 tablets in the evening    . diazepam (VALIUM) 5 MG tablet Take 1 tablet (5 mg total) by mouth at bedtime. 90 tablet 0  . ergocalciferol (DRISDOL) 200 MCG/ML drops Take by mouth daily. PURE brand    . FLUoxetine (PROZAC) 10 MG capsule Take 2 capsules (20 mg total) by mouth every morning. 180 capsule 0  . MAGNESIUM PO Take 200 mg by mouth at bedtime.    . Multiple Vitamin (MULTIVITAMIN) capsule Take 1 capsule by mouth daily.    . Omega-3 1000 MG CAPS Take by mouth.    .  TURMERIC PO Take by mouth.     No facility-administered medications prior to visit.    Allergies  Allergen Reactions  . Doxycycline Swelling  . Tetanus Toxoids Other (See Comments)    PAIN IN NECK AND FACE  . Flexeril [Cyclobenzaprine] Other (See Comments)    Review of Systems  Constitutional: Positive for malaise/fatigue. Negative for fever.  HENT: Negative for congestion.   Eyes: Negative for blurred vision.  Respiratory: Negative for shortness of breath.   Cardiovascular: Negative for chest pain, palpitations and leg swelling.  Gastrointestinal: Negative for abdominal pain, blood in stool and nausea.  Genitourinary: Negative for dysuria and frequency.  Musculoskeletal: Positive for myalgias. Negative for falls.  Skin: Negative for rash.  Neurological: Positive for dizziness and headaches. Negative for loss of consciousness.  Endo/Heme/Allergies: Negative for environmental allergies.  Psychiatric/Behavioral: Positive for depression. The patient is nervous/anxious and has insomnia.        Objective:    Physical Exam Vitals and nursing note reviewed.  Constitutional:      General: She is not in acute distress.    Appearance: She is well-developed.  HENT:     Head: Normocephalic and atraumatic.     Nose: Nose normal.  Eyes:     General:        Right eye: No discharge.        Left eye: No discharge.  Cardiovascular:     Rate and Rhythm: Normal rate and regular rhythm.     Heart sounds: No murmur heard.   Pulmonary:     Effort: Pulmonary effort is normal.     Breath sounds: Normal breath sounds.  Abdominal:     General: Bowel sounds are normal.     Palpations: Abdomen is soft.     Tenderness: There is no abdominal tenderness.  Musculoskeletal:     Cervical back: Normal range of motion and neck supple.  Skin:    General: Skin is warm and dry.  Neurological:     Mental Status: She is alert and oriented to person, place, and time.     BP 102/60 (BP Location:  Left Arm)   Pulse 66   Temp (!) 97.4 F (36.3 C) (Oral)   Resp 16   Wt 125 lb 6.4 oz (56.9 kg)   SpO2 99%   BMI 20.87 kg/m  Wt Readings from Last 3 Encounters:  02/04/20 125 lb  6.4 oz (56.9 kg)  01/10/20 125 lb (56.7 kg)  11/18/19 125 lb 12.8 oz (57.1 kg)    Diabetic Foot Exam - Simple   No data filed     Lab Results  Component Value Date   WBC 4.5 01/10/2020   HGB 12.8 01/10/2020   HCT 37.7 01/10/2020   PLT 297 01/10/2020   GLUCOSE 91 01/10/2020   ALT 13 01/10/2020   AST 20 01/10/2020   NA 138 01/10/2020   K 4.2 01/10/2020   CL 105 01/10/2020   CREATININE 0.97 (H) 01/10/2020   BUN 13 01/10/2020   CO2 24 01/10/2020   TSH 2.23 09/12/2019    Lab Results  Component Value Date   TSH 2.23 09/12/2019   Lab Results  Component Value Date   WBC 4.5 01/10/2020   HGB 12.8 01/10/2020   HCT 37.7 01/10/2020   MCV 92.2 01/10/2020   PLT 297 01/10/2020   Lab Results  Component Value Date   NA 138 01/10/2020   K 4.2 01/10/2020   CO2 24 01/10/2020   GLUCOSE 91 01/10/2020   BUN 13 01/10/2020   CREATININE 0.97 (H) 01/10/2020   BILITOT 0.4 01/10/2020   ALKPHOS 54 09/12/2019   AST 20 01/10/2020   ALT 13 01/10/2020   PROT 6.6 01/10/2020   ALBUMIN 4.4 09/12/2019   CALCIUM 9.9 01/10/2020   GFR 59.13 (L) 09/12/2019   No results found for: CHOL No results found for: HDL No results found for: LDLCALC No results found for: TRIG No results found for: CHOLHDL No results found for: HUDJ4H     Assessment & Plan:   Problem List Items Addressed This Visit    Fibromyalgia    She is given flu shot regular strength today. Last year she reports she got her shot in two smaller pediatric doses and she tolerated it well.       PTSD (post-traumatic stress disorder)    She is noting not feeling well or doing well on the virtual platform for her mental health concerns. She is not interested in switching providers presently but she will consider if things worsen.       COVID-19  long hauler    She feels confident she had COVID last year and is still suffering with fatigue. Myalgias, brain fog and more.        Other Visit Diagnoses    Influenza vaccine administered    -  Primary   Relevant Orders   Flu Vaccine QUAD 6+ mos PF IM (Fluarix Quad PF) (Completed)      I am having Dawn Simmons maintain her multivitamin, Calcium-Magnesium, vitamin C, ergocalciferol, MAGNESIUM PO, Omega-3, TURMERIC PO, diazepam, and FLUoxetine.  No orders of the defined types were placed in this encounter.    Danise Edge, MD

## 2020-02-05 NOTE — Assessment & Plan Note (Signed)
She feels confident she had COVID last year and is still suffering with fatigue. Myalgias, brain fog and more.

## 2020-02-06 ENCOUNTER — Ambulatory Visit (INDEPENDENT_AMBULATORY_CARE_PROVIDER_SITE_OTHER): Payer: Medicare Other | Admitting: Psychology

## 2020-02-06 DIAGNOSIS — F4310 Post-traumatic stress disorder, unspecified: Secondary | ICD-10-CM

## 2020-02-06 NOTE — Telephone Encounter (Signed)
I will follow up as soon as I'm available

## 2020-02-07 NOTE — Telephone Encounter (Signed)
Rtc to patient, after a very lengthy conversation. Patient asked if I was calling to let her know what her Dr. Abner Greenspan and Corie Chiquito had discussed and decided about the Diazepam. Informed her I was not aware of any communication regarding that situation but would check with Shanda Bumps.

## 2020-02-13 ENCOUNTER — Ambulatory Visit (INDEPENDENT_AMBULATORY_CARE_PROVIDER_SITE_OTHER): Payer: Medicare Other | Admitting: Psychology

## 2020-02-13 ENCOUNTER — Telehealth: Payer: Self-pay | Admitting: Psychiatry

## 2020-02-13 DIAGNOSIS — F4310 Post-traumatic stress disorder, unspecified: Secondary | ICD-10-CM

## 2020-02-13 NOTE — Telephone Encounter (Signed)
Patient called and said that Shanda Bumps has her on 5 mg of Diazepam at night. She tried to decrease it by 1/2 but she didn't sleep well at all. She wanted to find out what she should do with the medicine adjustment and the trouble sleeping. [phone]

## 2020-02-13 NOTE — Telephone Encounter (Signed)
Please review

## 2020-02-14 ENCOUNTER — Encounter: Payer: PRIVATE HEALTH INSURANCE | Admitting: Rehabilitative and Restorative Service Providers"

## 2020-02-14 NOTE — Telephone Encounter (Signed)
Patient aware.

## 2020-02-19 ENCOUNTER — Encounter: Payer: PRIVATE HEALTH INSURANCE | Admitting: Rehabilitative and Restorative Service Providers"

## 2020-02-20 ENCOUNTER — Ambulatory Visit (INDEPENDENT_AMBULATORY_CARE_PROVIDER_SITE_OTHER): Payer: Medicare Other | Admitting: Psychology

## 2020-02-20 DIAGNOSIS — F4310 Post-traumatic stress disorder, unspecified: Secondary | ICD-10-CM | POA: Diagnosis not present

## 2020-02-26 ENCOUNTER — Encounter: Payer: PRIVATE HEALTH INSURANCE | Admitting: Rehabilitative and Restorative Service Providers"

## 2020-02-27 ENCOUNTER — Ambulatory Visit (INDEPENDENT_AMBULATORY_CARE_PROVIDER_SITE_OTHER): Payer: Medicare Other | Admitting: Psychology

## 2020-02-27 DIAGNOSIS — F4310 Post-traumatic stress disorder, unspecified: Secondary | ICD-10-CM | POA: Diagnosis not present

## 2020-03-04 ENCOUNTER — Telehealth (INDEPENDENT_AMBULATORY_CARE_PROVIDER_SITE_OTHER): Payer: Medicare Other | Admitting: Family Medicine

## 2020-03-04 ENCOUNTER — Other Ambulatory Visit: Payer: Self-pay

## 2020-03-04 ENCOUNTER — Encounter: Payer: Self-pay | Admitting: Family Medicine

## 2020-03-04 ENCOUNTER — Encounter: Payer: PRIVATE HEALTH INSURANCE | Admitting: Rehabilitative and Restorative Service Providers"

## 2020-03-04 DIAGNOSIS — J40 Bronchitis, not specified as acute or chronic: Secondary | ICD-10-CM

## 2020-03-04 DIAGNOSIS — R059 Cough, unspecified: Secondary | ICD-10-CM | POA: Diagnosis not present

## 2020-03-04 DIAGNOSIS — Z20822 Contact with and (suspected) exposure to covid-19: Secondary | ICD-10-CM | POA: Diagnosis not present

## 2020-03-04 MED ORDER — AZITHROMYCIN 250 MG PO TABS: ORAL_TABLET | ORAL | 0 refills | Status: DC

## 2020-03-04 MED ORDER — PROMETHAZINE-DM 6.25-15 MG/5ML PO SYRP: 5.0000 mL | ORAL_SOLUTION | Freq: Four times a day (QID) | ORAL | 0 refills | Status: DC | PRN

## 2020-03-04 NOTE — Progress Notes (Signed)
Virtual Visit via Video Note  I connected with [name] on 03/04/20 at  9:00 AM EST by a video enabled telemedicine application and verified that I am speaking with the correct person using two identifiers.  Location/ persons in visit  Patient: home with her husband  Provider: home    I discussed the limitations of evaluation and management by telemedicine and the availability of in person appointments. The patient expressed understanding and agreed to proceed.  History of Present Illness: Pt c/o productive cough and congestion that started yesterday.  No fever  She had covid 2 years ago Pt is not wheezing  + nasal congestion   Past Medical History:  Diagnosis Date  . H/O fibromyalgia   . H/O insomnia   . H/O measles   . H/O rubella   . H/O syncope   . History of chicken pox 09/15/2019  . History of palpitations   . Hx of mumps   . Mitral valve prolapse   . Palpitations   . Syncope   . Von Willebrand disease [facility])    Outpatient Encounter Medications as of 03/04/2020  Medication Sig  . Ascorbic Acid (VITAMIN C) 100 MG tablet Take 100 mg by mouth daily.  . Calcium-Magnesium 200-100 MG TABS Take 4 tablets by mouth daily. 2 tablets in the morning and 2 tablets in the evening  . diazepam (VALIUM) 5 MG tablet Take 1 tablet (5 mg total) by mouth at bedtime.  . ergocalciferol (DRISDOL) 200 MCG/ML drops Take by mouth daily. PURE brand  . FLUoxetine (PROZAC) 10 MG capsule Take 2 capsules (20 mg total) by mouth every morning.  Marland Kitchen MAGNESIUM PO Take 200 mg by mouth at bedtime.  . Multiple Vitamin (MULTIVITAMIN) capsule Take 1 capsule by mouth daily.  . Omega-3 1000 MG CAPS Take by mouth.  . TURMERIC PO Take by mouth.  Marland Kitchen azithromycin (ZITHROMAX Z-PAK) 250 MG tablet As directed  . promethazine-dextromethorphan (PROMETHAZINE-DM) 6.25-15 MG/5ML syrup Take 5 mLs by mouth 4 (four) times daily as needed for cough.   No facility-administered encounter medications on file as of 03/04/2020.    Allergies  Allergen Reactions  . Doxycycline Swelling  . Tetanus Toxoids Other (See Comments)    PAIN IN NECK AND FACE  . Flexeril [Cyclobenzaprine] Other (See Comments)    Observations/Objective: There were no vitals filed for this visit.  Pt feels terrible but is in nad -- coughing constantly   Assessment and Plan: 1. Cough Bronchitis Pt will get a covid test  Cough med and z pak sent to pharmacy Call back if no improvement  - azithromycin (ZITHROMAX Z-PAK) 250 MG tablet; As directed  Dispense: 6 each; Refill: 0 - promethazine-dextromethorphan (PROMETHAZINE-DM) 6.25-15 MG/5ML syrup; Take 5 mLs by mouth 4 (four) times daily as needed for cough.  Dispense: 118 mL; Refill: 0   Follow Up Instructions:    I discussed the assessment and treatment plan with the patient. The patient was provided an opportunity to ask questions and all were answered. The patient agreed with the plan and demonstrated an understanding of the instructions.   The patient was advised to call back or seek an in-person evaluation if the symptoms worsen or if the condition fails to improve as anticipated.  I provided 25 minutes of non-face-to-face time during this encounter.   Donato Schultz, DO

## 2020-03-05 ENCOUNTER — Ambulatory Visit: Payer: Medicare Other | Admitting: Psychology

## 2020-03-12 ENCOUNTER — Ambulatory Visit (INDEPENDENT_AMBULATORY_CARE_PROVIDER_SITE_OTHER): Payer: Medicare Other | Admitting: Psychology

## 2020-03-12 DIAGNOSIS — F4310 Post-traumatic stress disorder, unspecified: Secondary | ICD-10-CM

## 2020-03-19 ENCOUNTER — Ambulatory Visit (INDEPENDENT_AMBULATORY_CARE_PROVIDER_SITE_OTHER): Payer: Medicare Other | Admitting: Psychology

## 2020-03-19 DIAGNOSIS — F4310 Post-traumatic stress disorder, unspecified: Secondary | ICD-10-CM | POA: Diagnosis not present

## 2020-03-20 ENCOUNTER — Ambulatory Visit (INDEPENDENT_AMBULATORY_CARE_PROVIDER_SITE_OTHER): Payer: Medicare Other | Admitting: Psychiatry

## 2020-03-20 ENCOUNTER — Other Ambulatory Visit: Payer: Self-pay

## 2020-03-20 ENCOUNTER — Encounter: Payer: Self-pay | Admitting: Psychiatry

## 2020-03-20 DIAGNOSIS — F4310 Post-traumatic stress disorder, unspecified: Secondary | ICD-10-CM

## 2020-03-20 DIAGNOSIS — G4700 Insomnia, unspecified: Secondary | ICD-10-CM

## 2020-03-20 MED ORDER — DIAZEPAM 5 MG PO TABS: 5.0000 mg | ORAL_TABLET | Freq: Every day | ORAL | 0 refills | Status: DC

## 2020-03-20 MED ORDER — FLUOXETINE HCL 10 MG PO CAPS: 20.0000 mg | ORAL_CAPSULE | Freq: Every morning | ORAL | 0 refills | Status: DC

## 2020-03-20 NOTE — Progress Notes (Signed)
Dawn Simmons [id number] 1946-01-28 74 y.o.  Subjective:   Patient ID:  Dawn Simmons is a 74 y.o. (DOB 1946/01/05) female.  Chief Complaint:  Chief Complaint  Patient presents with  . Anxiety  . Depression  . Follow-up    H/o insomnia    HPI Dawn Simmons presents to the office today for follow-up of anxiety, depression, and insomnia. She reports that she has been doing "pretty well." She just got a new cat and hopes this will help with feelings of loneliness. She reports that this cat reminds her of a favorite cat from the past.   Had a phone apt with Bambi Cottle, LCSW. Reports, "my mother always hated me." She reports that she has wanted to process this with therapist. She reports that session yesterday helped with this. She reports that she and her father were always close. She reports that therapy session has helped with her anxiety and "I feel more like I can breathe." She reports that her mood has been ok. Has been happy about her new cat. She reports that she did not take Diazepam one night and could not fall asleep and realized she did not take it. She reports adequate sleep, about 10 hours a night, with Diazepam. Energy has been ok. She reports that she watches news frequently. Reports that she has cleaned on a couple of occasions after not cleaning for awhile. She reports that she continues to have difficulty with memory. She reports that she is now remembering some things. Denies SI.   Plans to see therapist more often since virtual visits seem to be effective.   Was able to have Thanksgiving dinner with family. All of her daughters live within walking distance from her home. Continues to think of grandson that she helped raised and was taken away by his mother.  Past Psychiatric Medication Trials: (She reports that she is very sensitive to medication) Diazepam- Prescribed as need with limited improvement.  Prozac- reports that she has to take Prozac 20 mg in divided doses.    PHQ2-9   Flowsheet Row Clinical Support from 12/13/2019 in [facility] at [facility]  PHQ-2 Total Score 0       Review of Systems:  Review of Systems  Musculoskeletal: Negative for gait problem.  Neurological: Negative for tremors.  Psychiatric/Behavioral:       Please refer to HPI    Medications: I have reviewed the patient's current medications.  Current Outpatient Medications  Medication Sig Dispense Refill  . Ascorbic Acid (VITAMIN C) 100 MG tablet Take 100 mg by mouth daily.    Marland Kitchen azithromycin (ZITHROMAX Z-PAK) 250 MG tablet As directed 6 each 0  . Calcium-Magnesium 200-100 MG TABS Take 4 tablets by mouth daily. 2 tablets in the morning and 2 tablets in the evening    . [START ON 04/17/2020] diazepam (VALIUM) 5 MG tablet Take 1 tablet (5 mg total) by mouth at bedtime. 90 tablet 0  . ergocalciferol (DRISDOL) 200 MCG/ML drops Take by mouth daily. PURE brand    . FLUoxetine (PROZAC) 10 MG capsule Take 2 capsules (20 mg total) by mouth every morning. 180 capsule 0  . MAGNESIUM PO Take 200 mg by mouth at bedtime.    . Multiple Vitamin (MULTIVITAMIN) capsule Take 1 capsule by mouth daily.    . Omega-3 1000 MG CAPS Take by mouth.    . promethazine-dextromethorphan (PROMETHAZINE-DM) 6.25-15 MG/5ML syrup Take 5 mLs by mouth 4 (four) times daily as needed for cough.  118 mL 0  . TURMERIC PO Take by mouth.     No current facility-administered medications for this visit.    Medication Side Effects: None  Allergies:  Allergies  Allergen Reactions  . Doxycycline Swelling  . Tetanus Toxoids Other (See Comments)    PAIN IN NECK AND FACE  . Flexeril [Cyclobenzaprine] Other (See Comments)    Past Medical History:  Diagnosis Date  . H/O fibromyalgia   . H/O insomnia   . H/O measles   . H/O rubella   . H/O syncope   . History of chicken pox 09/15/2019  . History of palpitations   . Hx of mumps   . Mitral valve prolapse   . Palpitations   . Syncope   .  Von Willebrand disease (HCC)     Family History  Problem Relation Age of Onset  . Stroke Mother   . Emphysema Father        smoked  . Alcohol abuse Father   . Heart attack Maternal Grandfather   . Heart attack Paternal Grandfather   . Heart attack Maternal Uncle   . Heart failure Maternal Grandmother   . Heart failure Paternal Grandmother   . Seizures Neg Hx     Social History   Socioeconomic History  . Marital status: Married    Spouse name: tony  . Number of children: 4  . Years of education: 37  . Highest education level: Not on file  Occupational History  . Not on file  Tobacco Use  . Smoking status: Former Smoker    Packs/day: 0.50    Years: 10.00    Pack years: 5.00    Quit date: 04/27/1972    Years since quitting: 47.9  . Smokeless tobacco: Never Used  Vaping Use  . Vaping Use: Never used  Substance and Sexual Activity  . Alcohol use: Yes    Alcohol/week: 0.0 standard drinks    Comment: occ 1 glass wine or beer  . Drug use: No  . Sexual activity: Not on file  Other Topics Concern  . Not on file  Social History Narrative   Lives at home with husband   Caffeine use- tea, 1 cup/day   Social Determinants of Health   Financial Resource Strain: Low Risk   . Difficulty of Paying Living Expenses: Not hard at all  Food Insecurity: No Food Insecurity  . Worried About Programme researcher, broadcasting/film/video in the Last Year: Never true  . Ran Out of Food in the Last Year: Never true  Transportation Needs: No Transportation Needs  . Lack of Transportation (Medical): No  . Lack of Transportation (Non-Medical): No  Physical Activity: Not on file  Stress: Not on file  Social Connections: Not on file  Intimate Partner Violence: Not on file    Past Medical History, Surgical history, Social history, and Family history were reviewed and updated as appropriate.   Please see review of systems for further details on the patient's review from today.   Objective:   Physical Exam:   There were no vitals taken for this visit.  Physical Exam Constitutional:      General: She is not in acute distress. Musculoskeletal:        General: No deformity.  Neurological:     Mental Status: She is alert and oriented to person, place, and time.     Coordination: Coordination normal.  Psychiatric:        Attention and Perception: Attention and perception normal. She  does not perceive auditory or visual hallucinations.        Mood and Affect: Mood is anxious. Mood is not depressed. Affect is not labile, blunt, angry or inappropriate.        Speech: Speech normal.        Behavior: Behavior normal.        Thought Content: Thought content normal. Thought content is not paranoid or delusional. Thought content does not include homicidal or suicidal ideation. Thought content does not include homicidal or suicidal plan.        Cognition and Memory: She exhibits impaired recent memory.        Judgment: Judgment normal.     Comments: Insight intact     Lab Review:     Component Value Date/Time   NA 138 01/10/2020 1358   K 4.2 01/10/2020 1358   CL 105 01/10/2020 1358   CO2 24 01/10/2020 1358   GLUCOSE 91 01/10/2020 1358   BUN 13 01/10/2020 1358   CREATININE 0.97 (H) 01/10/2020 1358   CALCIUM 9.9 01/10/2020 1358   PROT 6.6 01/10/2020 1358   ALBUMIN 4.4 09/12/2019 1153   AST 20 01/10/2020 1358   ALT 13 01/10/2020 1358   ALKPHOS 54 09/12/2019 1153   BILITOT 0.4 01/10/2020 1358       Component Value Date/Time   WBC 4.5 01/10/2020 1358   RBC 4.09 01/10/2020 1358   HGB 12.8 01/10/2020 1358   HCT 37.7 01/10/2020 1358   PLT 297 01/10/2020 1358   MCV 92.2 01/10/2020 1358   MCH 31.3 01/10/2020 1358   MCHC 34.0 01/10/2020 1358   RDW 12.7 01/10/2020 1358   LYMPHSABS 1,197 01/10/2020 1358   MONOABS 0.5 10/31/2019 1509   EOSABS 198 01/10/2020 1358   BASOSABS 32 01/10/2020 1358    No results found for: POCLITH, LITHIUM   No results found for: PHENYTOIN, PHENOBARB,  VALPROATE, CBMZ   .res Assessment: Plan:   Continue Prozac 20 mg po qd for anxiety and depression. Continue Diazepam 5 mg po QHS for insomnia.  Recommend continuing therapy with Bambi Cottle, LCSW.  Pt to follow-up in 3 months or sooner if clinically indicated.  Patient advised to contact office with any questions, adverse effects, or acute worsening in signs and symptoms.  Jaquesha was seen today for anxiety, depression and follow-up.  Diagnoses and all orders for this visit:  PTSD (post-traumatic stress disorder) -     FLUoxetine (PROZAC) 10 MG capsule; Take 2 capsules (20 mg total) by mouth every morning. -     diazepam (VALIUM) 5 MG tablet; Take 1 tablet (5 mg total) by mouth at bedtime.  Insomnia, unspecified type     Please see After Visit Summary for patient specific instructions.  Future Appointments  Date Time Provider Department Center  03/26/2020  1:00 PM Cottle, Lynnell Dike, LCSW [facility] None  04/09/2020  1:00 PM Cottle, Bambi G, LCSW [facility] None  04/16/2020  1:00 PM Cottle, Bambi G, LCSW [facility] None  04/23/2020  1:00 PM Cottle, Bambi G, LCSW [facility] None  05/07/2020  2:40 PM Bradd Canary, MD [facility]  06/17/2020  1:00 PM Corie Chiquito, PMHNP [facility] None    No orders of the defined types were placed in this encounter.   -------------------------------

## 2020-03-26 ENCOUNTER — Ambulatory Visit (INDEPENDENT_AMBULATORY_CARE_PROVIDER_SITE_OTHER): Payer: Medicare Other | Admitting: Psychology

## 2020-03-26 DIAGNOSIS — F4310 Post-traumatic stress disorder, unspecified: Secondary | ICD-10-CM | POA: Diagnosis not present

## 2020-04-02 ENCOUNTER — Ambulatory Visit: Payer: Medicare Other | Admitting: Family Medicine

## 2020-04-02 ENCOUNTER — Ambulatory Visit: Payer: Medicare Other | Admitting: Psychology

## 2020-04-07 ENCOUNTER — Ambulatory Visit: Payer: Medicare Other | Admitting: Psychology

## 2020-04-09 ENCOUNTER — Ambulatory Visit: Payer: Medicare Other | Admitting: Psychology

## 2020-04-09 ENCOUNTER — Ambulatory Visit (INDEPENDENT_AMBULATORY_CARE_PROVIDER_SITE_OTHER): Payer: Medicare Other | Admitting: Psychology

## 2020-04-09 DIAGNOSIS — F4310 Post-traumatic stress disorder, unspecified: Secondary | ICD-10-CM | POA: Diagnosis not present

## 2020-04-16 ENCOUNTER — Ambulatory Visit (INDEPENDENT_AMBULATORY_CARE_PROVIDER_SITE_OTHER): Payer: Medicare Other | Admitting: Psychology

## 2020-04-16 DIAGNOSIS — F4310 Post-traumatic stress disorder, unspecified: Secondary | ICD-10-CM | POA: Diagnosis not present

## 2020-04-23 ENCOUNTER — Ambulatory Visit (INDEPENDENT_AMBULATORY_CARE_PROVIDER_SITE_OTHER): Payer: Medicare Other | Admitting: Psychology

## 2020-04-23 DIAGNOSIS — F4310 Post-traumatic stress disorder, unspecified: Secondary | ICD-10-CM | POA: Diagnosis not present

## 2020-04-30 ENCOUNTER — Ambulatory Visit (INDEPENDENT_AMBULATORY_CARE_PROVIDER_SITE_OTHER): Payer: Medicare Other | Admitting: Psychology

## 2020-04-30 DIAGNOSIS — F4310 Post-traumatic stress disorder, unspecified: Secondary | ICD-10-CM | POA: Diagnosis not present

## 2020-05-07 ENCOUNTER — Other Ambulatory Visit: Payer: Self-pay

## 2020-05-07 ENCOUNTER — Telehealth (INDEPENDENT_AMBULATORY_CARE_PROVIDER_SITE_OTHER): Payer: Medicare Other | Admitting: Family Medicine

## 2020-05-07 ENCOUNTER — Ambulatory Visit (INDEPENDENT_AMBULATORY_CARE_PROVIDER_SITE_OTHER): Payer: Medicare Other | Admitting: Psychology

## 2020-05-07 DIAGNOSIS — M797 Fibromyalgia: Secondary | ICD-10-CM

## 2020-05-07 DIAGNOSIS — U099 Post covid-19 condition, unspecified: Secondary | ICD-10-CM

## 2020-05-07 DIAGNOSIS — D72819 Decreased white blood cell count, unspecified: Secondary | ICD-10-CM

## 2020-05-07 DIAGNOSIS — F4310 Post-traumatic stress disorder, unspecified: Secondary | ICD-10-CM

## 2020-05-07 NOTE — Assessment & Plan Note (Signed)
Continue to eat a low inflammation diet, stay active, hydrate well

## 2020-05-07 NOTE — Assessment & Plan Note (Addendum)
She still feels like she has long term symptoms still with fatigue, myalgias, laryngitis. She might be improving slowly. She did have the first 2 Pfizer shots and is struggling with whether to take the third shot after she had a strange stabbing pain in her head a week or two after her shot. It has resolved but it has made her hesitant. She is encouraged to consider the third shot at the [facility] and to increase her antioxidant and anti inflammatory supplement intake.

## 2020-05-07 NOTE — Assessment & Plan Note (Signed)
Continue to monitor, asymptomatic 

## 2020-05-08 NOTE — Assessment & Plan Note (Signed)
She continues to follow with counseling and continues to find it helpful.

## 2020-05-08 NOTE — Progress Notes (Signed)
Virtual Visit via Video Note  I connected with Dawn Simmons on 05/07/20 at  2:40 PM EST by a video enabled telemedicine application and verified that I am speaking with the correct person using two identifiers.  Location: Patient: home, patient and provider are in visit Provider: office   I discussed the limitations of evaluation and management by telemedicine and the availability of in person appointments. The patient expressed understanding and agreed to proceed. S Chism, CMA was able to get the patient set up on a video visit    Subjective:    Patient ID: Dawn Simmons, female    DOB: 13-May-1945, 75 y.o.   MRN: [id number]  Chief Complaint  Patient presents with  . Follow-up    HPI Patient is in today for follow up on chronic medical concerns. No recent febrile illness or hospitalizations. She continues to struggle with long term symptoms after what she believes was COVID early in the pandemic. She has increased fatigue, myalgias and increased anxiety as well as insomnia. She is trying to decide if she is going to take her COVID shot booster. She does have some family coming to visit from [place] this weekend and is worried about not being boosted. Denies CP/palp/SOB/HA/congestion/fevers/GI or GU c/o. Taking meds as prescribed  Past Medical History:  Diagnosis Date  . H/O fibromyalgia   . H/O insomnia   . H/O measles   . H/O rubella   . H/O syncope   . History of chicken pox 09/15/2019  . History of palpitations   . Hx of mumps   . Mitral valve prolapse   . Palpitations   . Syncope   . Von Willebrand disease (HCC)     Past Surgical History:  Procedure Laterality Date  . NO PAST SURGERIES      Family History  Problem Relation Age of Onset  . Stroke Mother   . Emphysema Father        smoked  . Alcohol abuse Father   . Heart attack Maternal Grandfather   . Heart attack Paternal Grandfather   . Heart attack Maternal Uncle   . Heart failure Maternal Grandmother    . Heart failure Paternal Grandmother   . Seizures Neg Hx     Social History   Socioeconomic History  . Marital status: Married    Spouse name: tony  . Number of children: 4  . Years of education: 63  . Highest education level: Not on file  Occupational History  . Not on file  Tobacco Use  . Smoking status: Former Smoker    Packs/day: 0.50    Years: 10.00    Pack years: 5.00    Quit date: 04/27/1972    Years since quitting: 48.0  . Smokeless tobacco: Never Used  Vaping Use  . Vaping Use: Never used  Substance and Sexual Activity  . Alcohol use: Yes    Alcohol/week: 0.0 standard drinks    Comment: occ 1 glass wine or beer  . Drug use: No  . Sexual activity: Not on file  Other Topics Concern  . Not on file  Social History Narrative   Lives at home with husband   Caffeine use- tea, 1 cup/day   Social Determinants of Health   Financial Resource Strain: Low Risk   . Difficulty of Paying Living Expenses: Not hard at all  Food Insecurity: No Food Insecurity  . Worried About Programme researcher, broadcasting/film/video in the Last Year: Never true  . Ran Out  of Food in the Last Year: Never true  Transportation Needs: No Transportation Needs  . Lack of Transportation (Medical): No  . Lack of Transportation (Non-Medical): No  Physical Activity: Not on file  Stress: Not on file  Social Connections: Not on file  Intimate Partner Violence: Not on file    Outpatient Medications Prior to Visit  Medication Sig Dispense Refill  . Ascorbic Acid (VITAMIN C) 100 MG tablet Take 100 mg by mouth daily.    . Calcium-Magnesium 200-100 MG TABS Take 4 tablets by mouth daily. 2 tablets in the morning and 2 tablets in the evening    . diazepam (VALIUM) 5 MG tablet Take 1 tablet (5 mg total) by mouth at bedtime. 90 tablet 0  . ergocalciferol (DRISDOL) 200 MCG/ML drops Take by mouth daily. PURE brand    . FLUoxetine (PROZAC) 10 MG capsule Take 2 capsules (20 mg total) by mouth every morning. 180 capsule 0  .  MAGNESIUM PO Take 200 mg by mouth at bedtime.    . Multiple Vitamin (MULTIVITAMIN) capsule Take 1 capsule by mouth daily.    . Omega-3 1000 MG CAPS Take by mouth.    . TURMERIC PO Take by mouth.    Marland Kitchen azithromycin (ZITHROMAX Z-PAK) 250 MG tablet As directed 6 each 0  . promethazine-dextromethorphan (PROMETHAZINE-DM) 6.25-15 MG/5ML syrup Take 5 mLs by mouth 4 (four) times daily as needed for cough. 118 mL 0   No facility-administered medications prior to visit.    Allergies  Allergen Reactions  . Doxycycline Swelling  . Tetanus Toxoids Other (See Comments)    PAIN IN NECK AND FACE  . Flexeril [Cyclobenzaprine] Other (See Comments)    Review of Systems  Constitutional: Positive for malaise/fatigue. Negative for fever.  HENT: Negative for congestion.   Eyes: Negative for blurred vision.  Respiratory: Negative for shortness of breath.   Cardiovascular: Negative for chest pain, palpitations and leg swelling.  Gastrointestinal: Negative for abdominal pain, blood in stool and nausea.  Genitourinary: Negative for dysuria and frequency.  Musculoskeletal: Positive for joint pain and myalgias. Negative for falls.  Skin: Negative for rash.  Neurological: Negative for dizziness, loss of consciousness and headaches.  Endo/Heme/Allergies: Negative for environmental allergies.  Psychiatric/Behavioral: Negative for depression. The patient is nervous/anxious and has insomnia.        Objective:    Physical Exam Constitutional:      Appearance: Normal appearance. She is not ill-appearing.  HENT:     Head: Normocephalic and atraumatic.     Nose: Nose normal.  Eyes:     General:        Right eye: No discharge.        Left eye: Discharge present. Pulmonary:     Effort: Pulmonary effort is normal.  Neurological:     Mental Status: She is alert and oriented to person, place, and time.  Psychiatric:        Behavior: Behavior normal.     There were no vitals taken for this visit. Wt  Readings from Last 3 Encounters:  02/04/20 125 lb 6.4 oz (56.9 kg)  01/10/20 125 lb (56.7 kg)  11/18/19 125 lb 12.8 oz (57.1 kg)    Diabetic Foot Exam - Simple   No data filed    Lab Results  Component Value Date   WBC 4.5 01/10/2020   HGB 12.8 01/10/2020   HCT 37.7 01/10/2020   PLT 297 01/10/2020   GLUCOSE 91 01/10/2020   ALT 13 01/10/2020  AST 20 01/10/2020   NA 138 01/10/2020   K 4.2 01/10/2020   CL 105 01/10/2020   CREATININE 0.97 (H) 01/10/2020   BUN 13 01/10/2020   CO2 24 01/10/2020   TSH 2.23 09/12/2019    Lab Results  Component Value Date   TSH 2.23 09/12/2019   Lab Results  Component Value Date   WBC 4.5 01/10/2020   HGB 12.8 01/10/2020   HCT 37.7 01/10/2020   MCV 92.2 01/10/2020   PLT 297 01/10/2020   Lab Results  Component Value Date   NA 138 01/10/2020   K 4.2 01/10/2020   CO2 24 01/10/2020   GLUCOSE 91 01/10/2020   BUN 13 01/10/2020   CREATININE 0.97 (H) 01/10/2020   BILITOT 0.4 01/10/2020   ALKPHOS 54 09/12/2019   AST 20 01/10/2020   ALT 13 01/10/2020   PROT 6.6 01/10/2020   ALBUMIN 4.4 09/12/2019   CALCIUM 9.9 01/10/2020   GFR 59.13 (L) 09/12/2019   No results found for: CHOL No results found for: HDL No results found for: LDLCALC No results found for: TRIG No results found for: CHOLHDL No results found for: JOIT2P     Assessment & Plan:   Problem List Items Addressed This Visit    Fibromyalgia    Continue to eat a low inflammation diet, stay active, hydrate well      Leukopenia    Continue to monitor, asymptomatic      PTSD (post-traumatic stress disorder)    She continues to follow with counseling and continues to find it helpful.       COVID-19 long hauler    She still feels like she has long term symptoms still with fatigue, myalgias, laryngitis. She might be improving slowly. She did have the first 2 Pfizer shots and is struggling with whether to take the third shot after she had a strange stabbing pain in her head  a week or two after her shot. It has resolved but it has made her hesitant. She is encouraged to consider the third shot at the [facility] and to increase her antioxidant and anti inflammatory supplement intake.          I have discontinued [name]'s azithromycin and promethazine-dextromethorphan. I am also having her maintain her multivitamin, Calcium-Magnesium, vitamin C, ergocalciferol, MAGNESIUM PO, Omega-3, TURMERIC PO, FLUoxetine, and diazepam.  No orders of the defined types were placed in this encounter.    I discussed the assessment and treatment plan with the patient. The patient was provided an opportunity to ask questions and all were answered. The patient agreed with the plan and demonstrated an understanding of the instructions.   The patient was advised to call back or seek an in-person evaluation if the symptoms worsen or if the condition fails to improve as anticipated.  I provided 20 minutes of non-face-to-face time during this encounter.   Danise Edge, MD

## 2020-05-14 ENCOUNTER — Ambulatory Visit (INDEPENDENT_AMBULATORY_CARE_PROVIDER_SITE_OTHER): Payer: Medicare Other | Admitting: Psychology

## 2020-05-14 DIAGNOSIS — F4310 Post-traumatic stress disorder, unspecified: Secondary | ICD-10-CM

## 2020-05-21 ENCOUNTER — Ambulatory Visit (INDEPENDENT_AMBULATORY_CARE_PROVIDER_SITE_OTHER): Payer: Medicare Other | Admitting: Psychology

## 2020-05-21 DIAGNOSIS — F4310 Post-traumatic stress disorder, unspecified: Secondary | ICD-10-CM | POA: Diagnosis not present

## 2020-05-25 ENCOUNTER — Encounter: Payer: Self-pay | Admitting: Family Medicine

## 2020-05-28 ENCOUNTER — Ambulatory Visit (INDEPENDENT_AMBULATORY_CARE_PROVIDER_SITE_OTHER): Payer: Medicare Other | Admitting: Psychology

## 2020-05-28 DIAGNOSIS — F4310 Post-traumatic stress disorder, unspecified: Secondary | ICD-10-CM | POA: Diagnosis not present

## 2020-06-01 ENCOUNTER — Encounter: Payer: Self-pay | Admitting: Family Medicine

## 2020-06-04 ENCOUNTER — Ambulatory Visit (INDEPENDENT_AMBULATORY_CARE_PROVIDER_SITE_OTHER): Payer: Medicare Other | Admitting: Psychology

## 2020-06-04 DIAGNOSIS — F4310 Post-traumatic stress disorder, unspecified: Secondary | ICD-10-CM

## 2020-06-11 ENCOUNTER — Ambulatory Visit (INDEPENDENT_AMBULATORY_CARE_PROVIDER_SITE_OTHER): Payer: Medicare Other | Admitting: Psychology

## 2020-06-11 DIAGNOSIS — F4310 Post-traumatic stress disorder, unspecified: Secondary | ICD-10-CM

## 2020-06-17 ENCOUNTER — Other Ambulatory Visit: Payer: Self-pay

## 2020-06-17 ENCOUNTER — Encounter: Payer: Self-pay | Admitting: Psychiatry

## 2020-06-17 ENCOUNTER — Ambulatory Visit (INDEPENDENT_AMBULATORY_CARE_PROVIDER_SITE_OTHER): Payer: Medicare Other | Admitting: Psychiatry

## 2020-06-17 DIAGNOSIS — F4310 Post-traumatic stress disorder, unspecified: Secondary | ICD-10-CM

## 2020-06-17 MED ORDER — FLUOXETINE HCL 10 MG PO CAPS: 20.0000 mg | ORAL_CAPSULE | Freq: Every morning | ORAL | 0 refills | Status: DC

## 2020-06-17 MED ORDER — DIAZEPAM 5 MG PO TABS: 5.0000 mg | ORAL_TABLET | Freq: Every day | ORAL | 0 refills | Status: DC

## 2020-06-17 NOTE — Progress Notes (Signed)
Dawn Simmons [id number] Jan 31, 1946 75 y.o.  Subjective:   Patient ID:  Dawn Simmons is a 75 y.o. (DOB Jan 31, 1946) female.  Chief Complaint:  Chief Complaint  Patient presents with  . Anxiety    HPI Dawn Simmons presents to the office today for follow-up of anxiety, insomnia, and depression. She reports that she continues to experience anxiety. Denies panic attacks. She reports fear with elevators. She reports that there was a recent disagreement between her husband and daughter. She reports that she does not like conflict and worries about this. She reports that they have loaned money to several children and they have not repaid them. She reports that she has anxiety in response to current events.  Denies persistent sad mood.   She reports that she has been sleeping ok at night. She reports that she is not having nightmares. She reports that she has vivid dreams. She reports energy and motivation have been ok. She reports that she has never had much of an appetite. She recalls embarrassing incident in HS and reports that she did not eat lunch in the cafeteria throughout HS. She reports anhedonia. She reports that she enjoyed a meal with her daughters. She reports that she continues to have difficulty with disorientation and memory. She reports that she does not drive without her husband in the car. She reports that she is able to read and retain what she just read. Reads daily. She reports that she is no longer able to manage their finances or taxes. Denies SI. "I think about the rapture all the time."   She reports, "I'm a lot more sensitive than I used to be." Reports that she "smacked herself" with a tree branch and injured her lip. She reports that she was fearful that she damaged her teeth and reports that she has had fear of losing her teeth and nightmares about her teeth falling out. She reports that she rarely goes out. She will periodically go to church and other times will watch remote  services.   Went to 3 funerals in one week.   She reports that she continues to see Merry LoftyBambi Cottle, LCSW for therapy. She reports that her son is doing better.    Past Psychiatric Medication Trials: (She reports that she is very sensitive to medication) Diazepam- Prescribed as need with limited improvement.  Prozac- reports that she has to take Prozac 20 mg in divided doses.  PHQ2-9   Flowsheet Row Clinical Support from 12/13/2019 in [facility]  PHQ-2 Total Score 0       Review of Systems:  Review of Systems  HENT:       Reports swollen lip due to being hit by a branch  Musculoskeletal: Negative for gait problem.  Neurological: Positive for tremors.  Psychiatric/Behavioral:       Please refer to HPI    Medications: I have reviewed the patient's current medications.  Current Outpatient Medications  Medication Sig Dispense Refill  . Ascorbic Acid (VITAMIN C) 100 MG tablet Take 100 mg by mouth daily.    . Calcium-Magnesium 200-100 MG TABS Take 4 tablets by mouth daily. 2 tablets in the morning and 2 tablets in the evening    . diazepam (VALIUM) 5 MG tablet Take 1 tablet (5 mg total) by mouth at bedtime. 90 tablet 0  . ergocalciferol (DRISDOL) 200 MCG/ML drops Take by mouth daily. PURE brand    . FLUoxetine (PROZAC) 10 MG capsule Take 2 capsules (20  mg total) by mouth every morning. 180 capsule 0  . MAGNESIUM PO Take 200 mg by mouth at bedtime.    . Multiple Vitamin (MULTIVITAMIN) capsule Take 1 capsule by mouth daily.    . Omega-3 1000 MG CAPS Take by mouth.    . TURMERIC PO Take by mouth.     No current facility-administered medications for this visit.    Medication Side Effects: Other: Reports stomach discomfort if she takes Prozac on an empty stomach  Allergies:  Allergies  Allergen Reactions  . Doxycycline Swelling  . Tetanus Toxoids Other (See Comments)    PAIN IN NECK AND FACE  . Flexeril [Cyclobenzaprine] Other (See  Comments)    Past Medical History:  Diagnosis Date  . H/O fibromyalgia   . H/O insomnia   . H/O measles   . H/O rubella   . H/O syncope   . History of chicken pox 09/15/2019  . History of palpitations   . Hx of mumps   . Mitral valve prolapse   . Palpitations   . Syncope   . Von Willebrand disease (HCC)     Family History  Problem Relation Age of Onset  . Stroke Mother   . Emphysema Father        smoked  . Alcohol abuse Father   . Heart attack Maternal Grandfather   . Heart attack Paternal Grandfather   . Heart attack Maternal Uncle   . Heart failure Maternal Grandmother   . Heart failure Paternal Grandmother   . Seizures Neg Hx     Social History   Socioeconomic History  . Marital status: Married    Spouse name: tony  . Number of children: 4  . Years of education: 80  . Highest education level: Not on file  Occupational History  . Not on file  Tobacco Use  . Smoking status: Former Smoker    Packs/day: 0.50    Years: 10.00    Pack years: 5.00    Quit date: 04/27/1972    Years since quitting: 48.1  . Smokeless tobacco: Never Used  Vaping Use  . Vaping Use: Never used  Substance and Sexual Activity  . Alcohol use: Yes    Alcohol/week: 0.0 standard drinks    Comment: occ 1 glass wine or beer  . Drug use: No  . Sexual activity: Not on file  Other Topics Concern  . Not on file  Social History Narrative   Lives at home with husband   Caffeine use- tea, 1 cup/day   Social Determinants of Health   Financial Resource Strain: Low Risk   . Difficulty of Paying Living Expenses: Not hard at all  Food Insecurity: No Food Insecurity  . Worried About Programme researcher, broadcasting/film/video in the Last Year: Never true  . Ran Out of Food in the Last Year: Never true  Transportation Needs: No Transportation Needs  . Lack of Transportation (Medical): No  . Lack of Transportation (Non-Medical): No  Physical Activity: Not on file  Stress: Not on file  Social Connections: Not on file   Intimate Partner Violence: Not on file    Past Medical History, Surgical history, Social history, and Family history were reviewed and updated as appropriate.   Please see review of systems for further details on the patient's review from today.   Objective:   Physical Exam:  There were no vitals taken for this visit.  Physical Exam Constitutional:      General: She is not in  acute distress. Musculoskeletal:        General: No deformity.  Neurological:     Mental Status: She is alert and oriented to person, place, and time.     Coordination: Coordination normal.  Psychiatric:        Attention and Perception: Attention and perception normal. She does not perceive auditory or visual hallucinations.        Mood and Affect: Mood is anxious. Affect is tearful. Affect is not labile, blunt, angry or inappropriate.        Speech: Speech normal.        Behavior: Behavior normal.        Thought Content: Thought content normal. Thought content is not paranoid or delusional. Thought content does not include homicidal or suicidal ideation. Thought content does not include homicidal or suicidal plan.        Cognition and Memory: Cognition and memory normal.        Judgment: Judgment normal.     Comments: Insight intact Mood presents as somewhat depressed     Lab Review:     Component Value Date/Time   NA 138 01/10/2020 1358   K 4.2 01/10/2020 1358   CL 105 01/10/2020 1358   CO2 24 01/10/2020 1358   GLUCOSE 91 01/10/2020 1358   BUN 13 01/10/2020 1358   CREATININE 0.97 (H) 01/10/2020 1358   CALCIUM 9.9 01/10/2020 1358   PROT 6.6 01/10/2020 1358   ALBUMIN 4.4 09/12/2019 1153   AST 20 01/10/2020 1358   ALT 13 01/10/2020 1358   ALKPHOS 54 09/12/2019 1153   BILITOT 0.4 01/10/2020 1358       Component Value Date/Time   WBC 4.5 01/10/2020 1358   RBC 4.09 01/10/2020 1358   HGB 12.8 01/10/2020 1358   HCT 37.7 01/10/2020 1358   PLT 297 01/10/2020 1358   MCV 92.2 01/10/2020 1358    MCH 31.3 01/10/2020 1358   MCHC 34.0 01/10/2020 1358   RDW 12.7 01/10/2020 1358   LYMPHSABS 1,197 01/10/2020 1358   MONOABS 0.5 10/31/2019 1509   EOSABS 198 01/10/2020 1358   BASOSABS 32 01/10/2020 1358    No results found for: POCLITH, LITHIUM   No results found for: PHENYTOIN, PHENOBARB, VALPROATE, CBMZ   .res Assessment: Plan:   Pt seen for 30 minutes and time spent discussing whether to continue or decrease medications. Pt reports that she likely needs to continue medication since she continues to have episodes of increased anxiety and tearfulness. Discussed continuing current medications while she continues to work with therapist and then consider dose reduction in the future as s/s improve. Continue Prozac 20 mg po qd for anxiety.  Continue Diazepam 5 mg po QHS for insomnia and anxiety.  Pt to follow-up in 2 months or sooner if clinically indicated.  Patient advised to contact office with any questions, adverse effects, or acute worsening in signs and symptoms.  Tayana was seen today for anxiety.  Diagnoses and all orders for this visit:  PTSD (post-traumatic stress disorder) -     FLUoxetine (PROZAC) 10 MG capsule; Take 2 capsules (20 mg total) by mouth every morning. -     diazepam (VALIUM) 5 MG tablet; Take 1 tablet (5 mg total) by mouth at bedtime.     Please see After Visit Summary for patient specific instructions.  Future Appointments  Date Time Provider Department Center  07/02/2020  1:00 PM Cottle, Lynnell Dike, LCSW [facility] None  07/09/2020  1:00 PM Cottle, Bambi G, LCSW [facility]  None  08/04/2020  2:40 PM Bradd Canary, MD [facility]  08/17/2020  1:15 PM Corie Chiquito, PMHNP [facility] None    No orders of the defined types were placed in this encounter.   -------------------------------

## 2020-06-18 ENCOUNTER — Ambulatory Visit (INDEPENDENT_AMBULATORY_CARE_PROVIDER_SITE_OTHER): Payer: Medicare Other | Admitting: Psychology

## 2020-06-18 DIAGNOSIS — F4310 Post-traumatic stress disorder, unspecified: Secondary | ICD-10-CM | POA: Diagnosis not present

## 2020-07-02 ENCOUNTER — Ambulatory Visit (INDEPENDENT_AMBULATORY_CARE_PROVIDER_SITE_OTHER): Payer: Medicare Other | Admitting: Psychology

## 2020-07-02 DIAGNOSIS — F4310 Post-traumatic stress disorder, unspecified: Secondary | ICD-10-CM | POA: Diagnosis not present

## 2020-07-09 ENCOUNTER — Ambulatory Visit (INDEPENDENT_AMBULATORY_CARE_PROVIDER_SITE_OTHER): Payer: Medicare Other | Admitting: Psychology

## 2020-07-09 DIAGNOSIS — F4310 Post-traumatic stress disorder, unspecified: Secondary | ICD-10-CM

## 2020-07-10 ENCOUNTER — Telehealth: Payer: Self-pay | Admitting: Psychiatry

## 2020-07-10 DIAGNOSIS — F4310 Post-traumatic stress disorder, unspecified: Secondary | ICD-10-CM

## 2020-07-10 MED ORDER — DIAZEPAM 5 MG PO TABS: ORAL_TABLET | ORAL | 0 refills | Status: DC

## 2020-07-10 NOTE — Telephone Encounter (Signed)
Pt has been informed.

## 2020-07-10 NOTE — Telephone Encounter (Signed)
Please review

## 2020-07-10 NOTE — Telephone Encounter (Signed)
She can try to decrease it to 1/2 tablet at bedtime. If she is not able to sleep she can increase back to 1 tablet. She can alternate between 1/2-1 tablet if needed (ie., take the full tablet if she is more anxious and the 1/2 if she thinks she will fall asleep without difficulty).

## 2020-07-10 NOTE — Telephone Encounter (Signed)
Next visit is 08/17/20. Dawn Simmons called and said that she is currently taking Prozac 20 mg (Takes two 10 mg daily) and Diazepam 5 mg at night. She says she is sleeping way too much. She sleeps 12 hour nights and her husband has to wake her up in the daytime. She is tired all day long. She thinks she needs to back off of dosage on her Diazepam. Can show lower the dosage so she is not so tired? Her number is [phone]

## 2020-07-13 ENCOUNTER — Telehealth: Payer: Self-pay | Admitting: Psychiatry

## 2020-07-13 NOTE — Telephone Encounter (Signed)
Please review

## 2020-07-13 NOTE — Telephone Encounter (Signed)
Dawn Simmons called in today stating that she discussed decreasing her Diazepam and taking 1/2 at tablet at bedtime. She states she only slept about 1 or two hours and couldn't stay asleep. She would like to go back to her regular dosage. Pls CB if needed Ph: [phone]

## 2020-07-14 NOTE — Telephone Encounter (Signed)
Noted  

## 2020-07-14 NOTE — Telephone Encounter (Signed)
Spoke with her husband,Anthony and he stated she did take a whole tablet last night and is still asleep so it's working for her.

## 2020-07-16 ENCOUNTER — Ambulatory Visit (INDEPENDENT_AMBULATORY_CARE_PROVIDER_SITE_OTHER): Payer: Medicare Other | Admitting: Psychology

## 2020-07-16 DIAGNOSIS — F4310 Post-traumatic stress disorder, unspecified: Secondary | ICD-10-CM | POA: Diagnosis not present

## 2020-07-23 ENCOUNTER — Ambulatory Visit (INDEPENDENT_AMBULATORY_CARE_PROVIDER_SITE_OTHER): Payer: Medicare Other | Admitting: Psychology

## 2020-07-23 DIAGNOSIS — F4310 Post-traumatic stress disorder, unspecified: Secondary | ICD-10-CM

## 2020-08-04 ENCOUNTER — Ambulatory Visit (INDEPENDENT_AMBULATORY_CARE_PROVIDER_SITE_OTHER): Payer: Medicare Other | Admitting: Family Medicine

## 2020-08-04 ENCOUNTER — Encounter: Payer: Self-pay | Admitting: Family Medicine

## 2020-08-04 ENCOUNTER — Other Ambulatory Visit: Payer: Self-pay

## 2020-08-04 VITALS — BP 100/64 | HR 87 | Temp 98.0°F | Resp 16 | Wt 121.4 lb

## 2020-08-04 DIAGNOSIS — Z780 Asymptomatic menopausal state: Secondary | ICD-10-CM | POA: Diagnosis not present

## 2020-08-04 DIAGNOSIS — E2839 Other primary ovarian failure: Secondary | ICD-10-CM

## 2020-08-04 DIAGNOSIS — U099 Post covid-19 condition, unspecified: Secondary | ICD-10-CM | POA: Diagnosis not present

## 2020-08-04 DIAGNOSIS — M797 Fibromyalgia: Secondary | ICD-10-CM | POA: Diagnosis not present

## 2020-08-04 DIAGNOSIS — T7840XD Allergy, unspecified, subsequent encounter: Secondary | ICD-10-CM

## 2020-08-04 DIAGNOSIS — G4700 Insomnia, unspecified: Secondary | ICD-10-CM

## 2020-08-04 DIAGNOSIS — F4310 Post-traumatic stress disorder, unspecified: Secondary | ICD-10-CM

## 2020-08-04 DIAGNOSIS — T7840XA Allergy, unspecified, initial encounter: Secondary | ICD-10-CM | POA: Insufficient documentation

## 2020-08-04 NOTE — Assessment & Plan Note (Addendum)
She feels her antihistamines may be helping. She is working with Bambi of [facility] and she finds that helpful. Spent 30 minutes with patient discussing her current state and strategies and plans for managing her health and concerns.

## 2020-08-04 NOTE — Patient Instructions (Addendum)
Dawn Simmons, Dawn Simmons or Dawn Simmons for evaluation of eyes  Shingrix is the new shingles shot, 2 shots over 2-6 months, confirm coverage with insurance and document, then can return here for shots with nurse appt or at pharmacy Allergies, Adult An allergy is a condition in which the body's defense system (immune system) comes in contact with an allergen and reacts to it. An allergen is anything that causes an allergic reaction. Allergens cause the immune system to make proteins for fighting infections (antibodies). These antibodies cause cells to release chemicals called histamines that set off the symptoms of an allergic reaction. Allergies often affect the nasal passages (allergic rhinitis), eyes (allergic conjunctivitis), skin (atopic dermatitis), and stomach. Allergies can be mild, moderate, or severe. They cannot spread from person to person. Allergies can develop at any age and may be outgrown. What are the causes? This condition is caused by allergens. Common allergens include:  Outdoor allergens, such as pollen, car fumes, and mold.  Indoor allergens, such as dust, smoke, mold, and pet dander.  Other allergens, such as foods, medicines, scents, insect bites or stings, and other skin irritants. What increases the risk? You are more likely to develop this condition if you have:  Family members with allergies.  Family members who have any condition that may be caused by allergens, such as asthma. This may make you more likely to have other allergies. What are the signs or symptoms? Symptoms of this condition depend on the severity of the allergy. Mild to moderate symptoms  Runny nose, stuffy nose (nasal congestion), or sneezing.  Itchy mouth, ears, or throat.  A feeling of mucus dripping down the back of your throat (postnasal drip).  Sore throat.  Itchy, red, watery, or puffy eyes.  Skin rash, or itchy, red, swollen areas of skin (hives).  Stomach cramps or bloating. Severe  symptoms Severe allergies to food, medicine, or insect bites may cause anaphylaxis, which can be life-threatening. Symptoms include:  A red (flushed) face.  Wheezing or coughing.  Swollen lips, tongue, or mouth.  Tight or swollen throat.  Chest pain or tightness, or rapid heartbeat.  Trouble breathing or shortness of breath.  Pain in the abdomen, vomiting, or diarrhea.  Dizziness or fainting. How is this diagnosed? This condition is diagnosed based on your symptoms, your family and medical history, and a physical exam. You may also have tests, including:  Skin tests to see how your skin reacts to allergens that may be causing your symptoms. Tests include: ? Skin prick test. For this test, an allergen is introduced to your body through a small opening in the skin. ? Intradermal skin test. For this test, a small amount of allergen is injected under the first layer of your skin. ? Patch test. For this test, a small amount of allergen is placed on your skin. The area is covered and then checked after a few days.  Blood tests.  A challenge test. For this test, you will eat or breathe in a small amount of allergen to see if you have an allergic reaction. You may also be asked to:  Keep a food diary. This is a record of all the foods, drinks, and symptoms you have in a day.  Try an elimination diet. To do this: ? Remove certain foods from your diet. ? Add those foods back one by one to find out if any foods cause an allergic reaction. How is this treated? Treatment for allergies depends on your symptoms. Treatment may include:  Cold, wet cloths (cold compresses) to soothe itching and swelling.  Simmons drops or nasal sprays.  Nasal irrigation to help clear your mucus or keep the nasal passages moist.  A humidifier to add moisture to the air.  Skin creams to treat rashes or itching.  Oral antihistamines or other medicines to block the reaction or to treat inflammation.  Diet  changes to remove foods that cause allergies.  Being exposed again and again to tiny amounts of allergens to help you build a defense against it (tolerance). This is called immunotherapy. Examples include: ? Allergy shot. You receive an injection that contains an allergen. ? Sublingual immunotherapy. You take a small dose of allergen under your tongue.  Emergency injection for anaphylaxis. You give yourself a shot using a syringe (auto-injector) that contains the amount of medicine you need. Your health care provider will teach you how to give yourself an injection.      Follow these instructions at home: Medicines  Take or apply over-the-counter and prescription medicines only as told by your health care provider.  Always carry your auto-injector pen if you are at risk of anaphylaxis. Give yourself an injection as told by your health care provider.   Eating and drinking  Follow instructions from your health care provider about eating or drinking restrictions.  Drink enough fluid to keep your urine pale yellow. General instructions  Wear a medical alert bracelet or necklace to let others know that you have had anaphylaxis before.  Avoid known allergens whenever possible.  Keep all follow-up visits as told by your health care provider. This is important. Contact a health care provider if:  Your symptoms do not get better with treatment. Get help right away if:  You have symptoms of anaphylaxis. These include: ? Swollen mouth, tongue, or throat. ? Pain or tightness in your chest. ? Trouble breathing or shortness of breath. ? Dizziness or fainting. ? Severe abdominal pain, vomiting, or diarrhea. These symptoms may represent a serious problem that is an emergency. Do not wait to see if the symptoms will go away. Get medical help right away. Call your local emergency services (911 in the [place] Do not drive yourself to the hospital. Summary  Take or apply over-the-counter and  prescription medicines only as told by your health care provider.  Avoid known allergens when possible.  Always carry your auto-injector pen if you are at risk of anaphylaxis. Give yourself an injection as told by your health care provider.  Wear a medical alert bracelet or necklace to let others know that you have had anaphylaxis before.  Anaphylaxis is a life-threatening emergency. Get help right away. This information is not intended to replace advice given to you by your health care provider. Make sure you discuss any questions you have with your health care provider. Document Revised: 11/25/2019 Document Reviewed: 02/06/2019 Elsevier Patient Education  2021 [employer].

## 2020-08-04 NOTE — Assessment & Plan Note (Addendum)
Follows with [facility], Aggie Cosier she tried to drop her Diazepam 5 mg to 1/ 2 tab but she did not sleep so she went back up. Encouraged good sleep hygiene such as dark, quiet room. No blue/green glowing lights such as computer screens in bedroom. No alcohol or stimulants in evening. Cut down on caffeine as able. Regular exercise is helpful but not just prior to bed time.

## 2020-08-04 NOTE — Assessment & Plan Note (Signed)
Has been using Loratadine prn and that helps some.

## 2020-08-04 NOTE — Assessment & Plan Note (Signed)
Struggling with daily pain and stiffness but working in the yard has been helpful.

## 2020-08-06 ENCOUNTER — Ambulatory Visit (INDEPENDENT_AMBULATORY_CARE_PROVIDER_SITE_OTHER): Payer: Medicare Other | Admitting: Psychology

## 2020-08-06 DIAGNOSIS — F4310 Post-traumatic stress disorder, unspecified: Secondary | ICD-10-CM

## 2020-08-07 NOTE — Assessment & Plan Note (Signed)
She is beginning to feel like she is actually improving slowly but surely. She will continue to stay as active as she is able and maintain adequate hydration and a low inflammation diet.

## 2020-08-07 NOTE — Progress Notes (Signed)
Subjective:    Patient ID: Dawn Simmons, female    DOB: 07/09/1945, 75 y.o.   MRN: [id number]  Chief Complaint  Patient presents with  . Follow-up    Pt would like to discuss with provider  . Memory Loss    HPI Patient is in today for follow up on chronic medical concerns such as COVID Long haulers symptoms, insomnia and PTSD. She feels well today and she is accompanied by husband. No recent febrile illness or hospitalization. She feels she is slowly improving in regards to her long haul symptoms. Some dyspnea and fatigue persists. She is noting some episodes of feeling a slight bit dizzy and weak when she gets on the ground or bends but it is infrequent and no other associated symptoms. Denies CP/palp/HA/congestion/fevers/GI or GU c/o. Taking meds as prescribed. She tried decreasing her Diazepam in 1/2 at night but she was unable to sleep so she increased again.  Past Medical History:  Diagnosis Date  . H/O fibromyalgia   . H/O insomnia   . H/O measles   . H/O rubella   . H/O syncope   . History of chicken pox 09/15/2019  . History of palpitations   . Hx of mumps   . Mitral valve prolapse   . Palpitations   . Syncope   . Von Willebrand disease (HCC)     Past Surgical History:  Procedure Laterality Date  . NO PAST SURGERIES      Family History  Problem Relation Age of Onset  . Stroke Mother   . Emphysema Father        smoked  . Alcohol abuse Father   . Heart attack Maternal Grandfather   . Heart attack Paternal Grandfather   . Heart attack Maternal Uncle   . Heart failure Maternal Grandmother   . Heart failure Paternal Grandmother   . Seizures Neg Hx     Social History   Socioeconomic History  . Marital status: Married    Spouse name: Dawn Simmons  . Number of children: 4  . Years of education: 38  . Highest education level: Not on file  Occupational History  . Not on file  Tobacco Use  . Smoking status: Former Smoker    Packs/day: 0.50    Years: 10.00    Pack  years: 5.00    Quit date: 04/27/1972    Years since quitting: 48.3  . Smokeless tobacco: Never Used  Vaping Use  . Vaping Use: Never used  Substance and Sexual Activity  . Alcohol use: Yes    Alcohol/week: 0.0 standard drinks    Comment: occ 1 glass wine or beer  . Drug use: No  . Sexual activity: Not on file  Other Topics Concern  . Not on file  Social History Narrative   Lives at home with husband   Caffeine use- tea, 1 cup/day   Social Determinants of Health   Financial Resource Strain: Low Risk   . Difficulty of Paying Living Expenses: Not hard at all  Food Insecurity: No Food Insecurity  . Worried About Programme researcher, broadcasting/film/video in the Last Year: Never true  . Ran Out of Food in the Last Year: Never true  Transportation Needs: No Transportation Needs  . Lack of Transportation (Medical): No  . Lack of Transportation (Non-Medical): No  Physical Activity: Not on file  Stress: Not on file  Social Connections: Not on file  Intimate Partner Violence: Not on file    Outpatient Medications Prior  to Visit  Medication Sig Dispense Refill  . Ascorbic Acid (VITAMIN C) 100 MG tablet Take 100 mg by mouth daily.    . Calcium-Magnesium 200-100 MG TABS Take 4 tablets by mouth daily. 2 tablets in the morning and 2 tablets in the evening    . diazepam (VALIUM) 5 MG tablet Take 1/2-1 tablet po QHS 90 tablet 0  . ergocalciferol (DRISDOL) 200 MCG/ML drops Take by mouth daily. PURE brand    . FLUoxetine (PROZAC) 10 MG capsule Take 2 capsules (20 mg total) by mouth every morning. 180 capsule 0  . loratadine (CLARITIN) 10 MG tablet Take 10 mg by mouth daily.    Marland Kitchen MAGNESIUM PO Take 200 mg by mouth at bedtime.    . Multiple Vitamin (MULTIVITAMIN) capsule Take 1 capsule by mouth daily.    . Omega-3 1000 MG CAPS Take by mouth.    . TURMERIC PO Take by mouth.     No facility-administered medications prior to visit.    Allergies  Allergen Reactions  . Doxycycline Swelling  . Tetanus Toxoids Other  (See Comments)    PAIN IN NECK AND FACE  . Flexeril [Cyclobenzaprine] Other (See Comments)    Review of Systems  Constitutional: Positive for malaise/fatigue. Negative for fever.  HENT: Negative for congestion.   Eyes: Negative for blurred vision.  Respiratory: Positive for shortness of breath.   Cardiovascular: Negative for chest pain, palpitations and leg swelling.  Gastrointestinal: Negative for abdominal pain, blood in stool and nausea.  Genitourinary: Negative for dysuria and frequency.  Musculoskeletal: Negative for falls.  Skin: Negative for rash.  Neurological: Positive for dizziness. Negative for loss of consciousness and headaches.  Endo/Heme/Allergies: Negative for environmental allergies.  Psychiatric/Behavioral: Negative for depression. The patient is nervous/anxious and has insomnia.        Objective:    Physical Exam Vitals and nursing note reviewed.  Constitutional:      General: She is not in acute distress.    Appearance: She is well-developed.  HENT:     Head: Normocephalic and atraumatic.     Nose: Nose normal.  Eyes:     General:        Right eye: No discharge.        Left eye: No discharge.  Cardiovascular:     Rate and Rhythm: Normal rate and regular rhythm.     Heart sounds: No murmur heard.   Pulmonary:     Effort: Pulmonary effort is normal.     Breath sounds: Normal breath sounds.  Abdominal:     General: Bowel sounds are normal.     Palpations: Abdomen is soft.     Tenderness: There is no abdominal tenderness.  Musculoskeletal:     Cervical back: Normal range of motion and neck supple.  Skin:    General: Skin is warm and dry.  Neurological:     Mental Status: She is alert and oriented to person, place, and time.     BP 100/64   Pulse 87   Temp 98 F (36.7 C)   Resp 16   Wt 121 lb 6.4 oz (55.1 kg)   SpO2 98%   BMI 20.20 kg/m  Wt Readings from Last 3 Encounters:  08/04/20 121 lb 6.4 oz (55.1 kg)  02/04/20 125 lb 6.4 oz (56.9  kg)  01/10/20 125 lb (56.7 kg)    Diabetic Foot Exam - Simple   No data filed    Lab Results  Component Value Date  WBC 4.5 01/10/2020   HGB 12.8 01/10/2020   HCT 37.7 01/10/2020   PLT 297 01/10/2020   GLUCOSE 91 01/10/2020   ALT 13 01/10/2020   AST 20 01/10/2020   NA 138 01/10/2020   K 4.2 01/10/2020   CL 105 01/10/2020   CREATININE 0.97 (H) 01/10/2020   BUN 13 01/10/2020   CO2 24 01/10/2020   TSH 2.23 09/12/2019    Lab Results  Component Value Date   TSH 2.23 09/12/2019   Lab Results  Component Value Date   WBC 4.5 01/10/2020   HGB 12.8 01/10/2020   HCT 37.7 01/10/2020   MCV 92.2 01/10/2020   PLT 297 01/10/2020   Lab Results  Component Value Date   NA 138 01/10/2020   K 4.2 01/10/2020   CO2 24 01/10/2020   GLUCOSE 91 01/10/2020   BUN 13 01/10/2020   CREATININE 0.97 (H) 01/10/2020   BILITOT 0.4 01/10/2020   ALKPHOS 54 09/12/2019   AST 20 01/10/2020   ALT 13 01/10/2020   PROT 6.6 01/10/2020   ALBUMIN 4.4 09/12/2019   CALCIUM 9.9 01/10/2020   GFR 59.13 (L) 09/12/2019   No results found for: CHOL No results found for: HDL No results found for: LDLCALC No results found for: TRIG No results found for: CHOLHDL No results found for: KPVV7S     Assessment & Plan:   Problem List Items Addressed This Visit    Fibromyalgia    Struggling with daily pain and stiffness but working in the yard has been helpful.      Insomnia    Follows with [facility], Dawn Simmons she tried to drop her Diazepam 5 mg to 1/ 2 tab but she did not sleep so she went back up. Encouraged good sleep hygiene such as dark, quiet room. No blue/green glowing lights such as computer screens in bedroom. No alcohol or stimulants in evening. Cut down on caffeine as able. Regular exercise is helpful but not just prior to bed time.       PTSD (post-traumatic stress disorder)    She feels her antihistamines may be helping. She is working with Dawn Simmons of [facility] and she finds that helpful. Spent  30 minutes with patient discussing her current state and strategies and plans for managing her health and concerns.      COVID-19 long hauler    She is beginning to feel like she is actually improving slowly but surely. She will continue to stay as active as she is able and maintain adequate hydration and a low inflammation diet.       Allergies    Has been using Loratadine prn and that helps some.        Other Visit Diagnoses    Post-menopausal    -  Primary   Relevant Orders   DG Bone Density   Estrogen deficiency       Relevant Orders   DG Bone Density      I am having Dawn Simmons maintain her multivitamin, Calcium-Magnesium, vitamin C, ergocalciferol, MAGNESIUM PO, Omega-3, TURMERIC PO, FLUoxetine, diazepam, and loratadine.  No orders of the defined types were placed in this encounter.    Danise Edge, MD

## 2020-08-13 ENCOUNTER — Ambulatory Visit (INDEPENDENT_AMBULATORY_CARE_PROVIDER_SITE_OTHER): Payer: Medicare Other | Admitting: Psychology

## 2020-08-13 DIAGNOSIS — F4310 Post-traumatic stress disorder, unspecified: Secondary | ICD-10-CM | POA: Diagnosis not present

## 2020-08-17 ENCOUNTER — Ambulatory Visit: Payer: Medicare Other | Admitting: Psychiatry

## 2020-08-20 ENCOUNTER — Other Ambulatory Visit: Payer: Self-pay

## 2020-08-20 ENCOUNTER — Ambulatory Visit (INDEPENDENT_AMBULATORY_CARE_PROVIDER_SITE_OTHER): Payer: Medicare Other | Admitting: Psychology

## 2020-08-20 DIAGNOSIS — F4310 Post-traumatic stress disorder, unspecified: Secondary | ICD-10-CM

## 2020-08-24 ENCOUNTER — Ambulatory Visit (HOSPITAL_BASED_OUTPATIENT_CLINIC_OR_DEPARTMENT_OTHER)
Admission: RE | Admit: 2020-08-24 | Discharge: 2020-08-24 | Disposition: A | Discharge status: Home / Self Care | Payer: Medicare Other | Source: Ambulatory Visit | Attending: Family Medicine | Admitting: Family Medicine

## 2020-08-24 ENCOUNTER — Other Ambulatory Visit: Payer: Self-pay

## 2020-08-24 DIAGNOSIS — M810 Age-related osteoporosis without current pathological fracture: Secondary | ICD-10-CM | POA: Diagnosis not present

## 2020-08-24 DIAGNOSIS — E2839 Other primary ovarian failure: Secondary | ICD-10-CM | POA: Diagnosis not present

## 2020-08-24 DIAGNOSIS — Z780 Asymptomatic menopausal state: Secondary | ICD-10-CM | POA: Diagnosis not present

## 2020-08-27 ENCOUNTER — Encounter: Payer: Self-pay | Admitting: Psychiatry

## 2020-08-27 ENCOUNTER — Ambulatory Visit (INDEPENDENT_AMBULATORY_CARE_PROVIDER_SITE_OTHER): Payer: Medicare Other | Admitting: Psychiatry

## 2020-08-27 ENCOUNTER — Ambulatory Visit (INDEPENDENT_AMBULATORY_CARE_PROVIDER_SITE_OTHER): Payer: Medicare Other | Admitting: Psychology

## 2020-08-27 ENCOUNTER — Other Ambulatory Visit: Payer: Self-pay

## 2020-08-27 VITALS — Wt 121.0 lb

## 2020-08-27 DIAGNOSIS — G4700 Insomnia, unspecified: Secondary | ICD-10-CM | POA: Diagnosis not present

## 2020-08-27 DIAGNOSIS — F4310 Post-traumatic stress disorder, unspecified: Secondary | ICD-10-CM

## 2020-08-27 MED ORDER — FLUOXETINE HCL 10 MG PO CAPS: 20.0000 mg | ORAL_CAPSULE | Freq: Every morning | ORAL | 0 refills | Status: DC

## 2020-08-27 MED ORDER — DIAZEPAM 5 MG PO TABS: ORAL_TABLET | ORAL | 0 refills | Status: DC

## 2020-08-27 NOTE — Progress Notes (Signed)
Dawn Simmons [id number] 07-15-1945 75 y.o.  Subjective:   Patient ID:  Dawn Simmons is a 75 y.o. (DOB 15-Sep-1945) female.  Chief Complaint:  Chief Complaint  Patient presents with  . Follow-up    Anxiety, insomnia    HPI Dawn Simmons presents to the office today for follow-up of anxiety and insomnia. She reports anxiety is "pretty good." Some rumination about family. She reports that she is not sleeping as long as she was (12 hours) and now is sleeping about 10 hours. She reports that she tried taking Valium 1/2 tablet one night and was unable to sleep. She reports that her energy and motivation have been good and has been working intensely in her yard for [employer]. She reports that she has never had much of an appetite. Denies SI- "I do threaten it but I don't mean it. I would never do that."   She reports some improvement in memory. Concentration is ok. She reports that she is doing crossword puzzles daily.    Enjoying working in her yard.   She reports that she avoids using elevators.   Continues to see Bambi Cottle, LCSW. Plans to process loss of grandson being taken from them after he had lived with her for years. Reports that she is fearful of losing contact with son's other children.   One daughter is in the process of a divorce.    Past Psychiatric Medication Trials: (She reports that she is very sensitive to medication) Diazepam- Prescribed as need with limited improvement.  Prozac- reports that she has to take Prozac 20 mg in divided doses.   PHQ2-9   Flowsheet Row Clinical Support from 12/13/2019 in [facility] at [employer]  PHQ-2 Total Score 0       Review of Systems:  Review of Systems  Gastrointestinal: Negative.   Musculoskeletal: Negative for gait problem.       Groin pain after lifting weights and gardening  Psychiatric/Behavioral:       Please refer to HPI    Medications: I have reviewed the patient's current  medications.  Current Outpatient Medications  Medication Sig Dispense Refill  . loratadine (CLARITIN) 10 MG tablet Take 10 mg by mouth daily.    . Ascorbic Acid (VITAMIN C) 100 MG tablet Take 100 mg by mouth daily.    . Calcium-Magnesium 200-100 MG TABS Take 4 tablets by mouth daily. 2 tablets in the morning and 2 tablets in the evening    . [START ON 10/24/2020] diazepam (VALIUM) 5 MG tablet Take 1/2-1 tablet po QHS 90 tablet 0  . ergocalciferol (DRISDOL) 200 MCG/ML drops Take by mouth daily. PURE brand    . FLUoxetine (PROZAC) 10 MG capsule Take 2 capsules (20 mg total) by mouth every morning. 180 capsule 0  . MAGNESIUM PO Take 200 mg by mouth at bedtime.    . Multiple Vitamin (MULTIVITAMIN) capsule Take 1 capsule by mouth daily.    . Omega-3 1000 MG CAPS Take by mouth.    . TURMERIC PO Take by mouth.     No current facility-administered medications for this visit.    Medication Side Effects: None  Allergies:  Allergies  Allergen Reactions  . Doxycycline Swelling  . Tetanus Toxoids Other (See Comments)    PAIN IN NECK AND FACE  . Flexeril [Cyclobenzaprine] Other (See Comments)    Past Medical History:  Diagnosis Date  . H/O fibromyalgia   . H/O insomnia   . H/O measles   .  H/O rubella   . H/O syncope   . History of chicken pox 09/15/2019  . History of palpitations   . Hx of mumps   . Mitral valve prolapse   . Palpitations   . Syncope   . Von Willebrand disease (HCC)     Past Medical History, Surgical history, Social history, and Family history were reviewed and updated as appropriate.   Please see review of systems for further details on the patient's review from today.   Objective:   Physical Exam:  Wt 121 lb (54.9 kg)   BMI 20.14 kg/m   Physical Exam Constitutional:      General: She is not in acute distress. Musculoskeletal:        General: No deformity.  Neurological:     Mental Status: She is alert and oriented to person, place, and time.      Coordination: Coordination normal.  Psychiatric:        Attention and Perception: Attention and perception normal. She does not perceive auditory or visual hallucinations.        Mood and Affect: Mood normal. Mood is not anxious or depressed. Affect is not labile, blunt, angry or inappropriate.        Speech: Speech normal.        Behavior: Behavior normal.        Thought Content: Thought content normal. Thought content is not paranoid or delusional. Thought content does not include homicidal or suicidal ideation. Thought content does not include homicidal or suicidal plan.        Cognition and Memory: Cognition normal. She exhibits impaired recent memory.        Judgment: Judgment normal.     Comments: Insight intact Some rumination Some impaired recall (repeats herself at times on exam)     Lab Review:     Component Value Date/Time   NA 138 01/10/2020 1358   K 4.2 01/10/2020 1358   CL 105 01/10/2020 1358   CO2 24 01/10/2020 1358   GLUCOSE 91 01/10/2020 1358   BUN 13 01/10/2020 1358   CREATININE 0.97 (H) 01/10/2020 1358   CALCIUM 9.9 01/10/2020 1358   PROT 6.6 01/10/2020 1358   ALBUMIN 4.4 09/12/2019 1153   AST 20 01/10/2020 1358   ALT 13 01/10/2020 1358   ALKPHOS 54 09/12/2019 1153   BILITOT 0.4 01/10/2020 1358       Component Value Date/Time   WBC 4.5 01/10/2020 1358   RBC 4.09 01/10/2020 1358   HGB 12.8 01/10/2020 1358   HCT 37.7 01/10/2020 1358   PLT 297 01/10/2020 1358   MCV 92.2 01/10/2020 1358   MCH 31.3 01/10/2020 1358   MCHC 34.0 01/10/2020 1358   RDW 12.7 01/10/2020 1358   LYMPHSABS 1,197 01/10/2020 1358   MONOABS 0.5 10/31/2019 1509   EOSABS 198 01/10/2020 1358   BASOSABS 32 01/10/2020 1358    No results found for: POCLITH, LITHIUM   No results found for: PHENYTOIN, PHENOBARB, VALPROATE, CBMZ   .res Assessment: Plan:   Pt seen for 30 minutes and discussed treatment plan and reviewed that she had insomnia with dose reduction attempt with Diazepam  and recommend continuing 5 mg po QHS.  Will continue current plan of care since target signs and symptoms are well controlled without any tolerability issues. Recommend continuing Prozac 20 mg po qd for anxiety.  Continue Diazepam 5 mg po QHS for insomnia and anxiety.  Pt to follow-up in 3 months or sooner if clinically indicated.  Recommend  continuing therapy with Bambi Cottle, LCSW.  Patient advised to contact office with any questions, adverse effects, or acute worsening in signs and symptoms.  Dawn Simmons was seen today for follow-up.  Diagnoses and all orders for this visit:  PTSD (post-traumatic stress disorder) -     FLUoxetine (PROZAC) 10 MG capsule; Take 2 capsules (20 mg total) by mouth every morning. -     diazepam (VALIUM) 5 MG tablet; Take 1/2-1 tablet po QHS  Insomnia, unspecified type -     diazepam (VALIUM) 5 MG tablet; Take 1/2-1 tablet po QHS     Please see After Visit Summary for patient specific instructions.  Future Appointments  Date Time Provider Department Center  08/27/2020  1:00 PM Cottle, Bambi G, LCSW [facility] None  09/03/2020  1:00 PM Cottle, Bambi G, LCSW [facility] None  09/10/2020  1:00 PM Cottle, Bambi G, LCSW [facility] None  09/17/2020  1:00 PM Cottle, Bambi G, LCSW [facility] None  10/01/2020  1:00 PM Cottle, Bambi G, LCSW [facility] None  10/08/2020  1:00 PM Cottle, Bambi G, LCSW [facility] None  11/24/2020 11:00 AM Bradd Canary, MD [facility]  11/25/2020 11:30 AM Corie Chiquito, PMHNP [facility] None    No orders of the defined types were placed in this encounter.   -------------------------------

## 2020-09-03 ENCOUNTER — Ambulatory Visit (INDEPENDENT_AMBULATORY_CARE_PROVIDER_SITE_OTHER): Payer: Medicare Other | Admitting: Psychology

## 2020-09-03 DIAGNOSIS — F4310 Post-traumatic stress disorder, unspecified: Secondary | ICD-10-CM

## 2020-09-10 ENCOUNTER — Ambulatory Visit (INDEPENDENT_AMBULATORY_CARE_PROVIDER_SITE_OTHER): Payer: Medicare Other | Admitting: Psychology

## 2020-09-10 DIAGNOSIS — F4310 Post-traumatic stress disorder, unspecified: Secondary | ICD-10-CM

## 2020-09-14 ENCOUNTER — Telehealth: Payer: Self-pay

## 2020-09-14 NOTE — Telephone Encounter (Signed)
Noted  

## 2020-09-14 NOTE — Telephone Encounter (Signed)
Prior Approval received for DIAZEPAM 5 MG #90 effective 04/11/2020-10/13/2020 with [employer] ID# [id number].    RX approval is short term due to pt's age and diagnosis.

## 2020-09-16 ENCOUNTER — Ambulatory Visit: Payer: Medicare Other | Admitting: Psychology

## 2020-09-17 ENCOUNTER — Other Ambulatory Visit: Payer: Self-pay

## 2020-09-17 ENCOUNTER — Ambulatory Visit (INDEPENDENT_AMBULATORY_CARE_PROVIDER_SITE_OTHER): Payer: Medicare Other | Admitting: Psychology

## 2020-09-17 DIAGNOSIS — F4310 Post-traumatic stress disorder, unspecified: Secondary | ICD-10-CM

## 2020-09-24 ENCOUNTER — Ambulatory Visit: Payer: Medicare Other | Admitting: Psychology

## 2020-09-29 ENCOUNTER — Ambulatory Visit: Payer: Medicare Other | Admitting: Psychiatry

## 2020-10-01 ENCOUNTER — Ambulatory Visit (INDEPENDENT_AMBULATORY_CARE_PROVIDER_SITE_OTHER): Payer: Medicare Other | Admitting: Psychology

## 2020-10-01 ENCOUNTER — Other Ambulatory Visit: Payer: Self-pay

## 2020-10-01 DIAGNOSIS — F4310 Post-traumatic stress disorder, unspecified: Secondary | ICD-10-CM

## 2020-10-08 ENCOUNTER — Other Ambulatory Visit: Payer: Self-pay

## 2020-10-08 ENCOUNTER — Ambulatory Visit (INDEPENDENT_AMBULATORY_CARE_PROVIDER_SITE_OTHER): Payer: Medicare Other | Admitting: Psychology

## 2020-10-08 DIAGNOSIS — F4310 Post-traumatic stress disorder, unspecified: Secondary | ICD-10-CM | POA: Diagnosis not present

## 2020-10-09 DIAGNOSIS — H25813 Combined forms of age-related cataract, bilateral: Secondary | ICD-10-CM | POA: Diagnosis not present

## 2020-10-09 DIAGNOSIS — H40013 Open angle with borderline findings, low risk, bilateral: Secondary | ICD-10-CM | POA: Diagnosis not present

## 2020-10-10 DIAGNOSIS — Z20822 Contact with and (suspected) exposure to covid-19: Secondary | ICD-10-CM | POA: Diagnosis not present

## 2020-10-11 IMAGING — CT CT HEAD W/O CM
3 series · 16 of 47 positions shown, 19 images · non-contrast
Comparison: None.

CLINICAL DATA: Right-sided weakness, transient ischemic attack.

EXAM:
CT HEAD WITHOUT CONTRAST
TECHNIQUE: Contiguous axial images were obtained from the base of the skull
through the vertex without intravenous contrast.

[Series 2: head wo · axial · 0.42mm/px · z∈[+1024,+1170]mm · 10 of 35 slices shown, 13 images]
[im 3/35  brain]
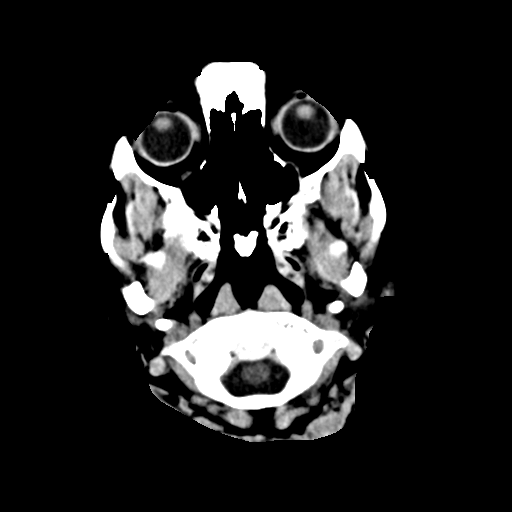
[im 3/35  bone]
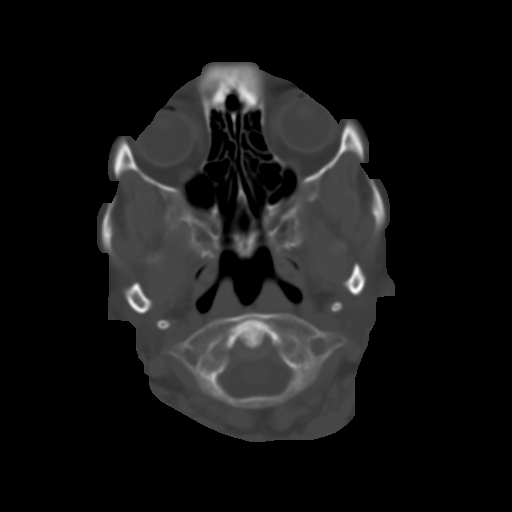
[im 6/35  brain]
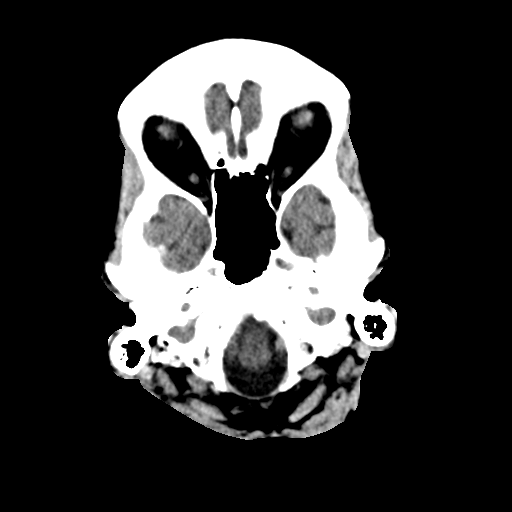
[im 10/35  brain]
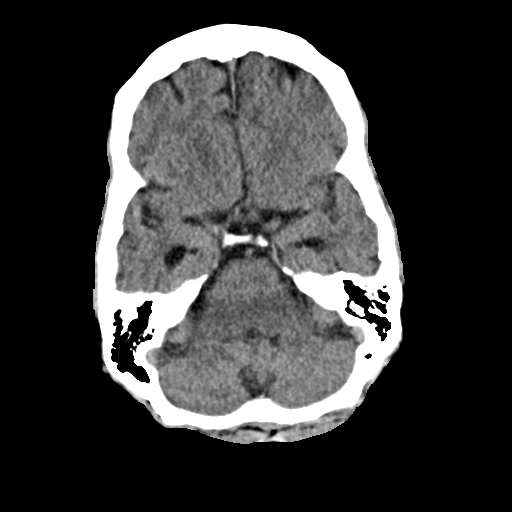
[im 12/35  brain]
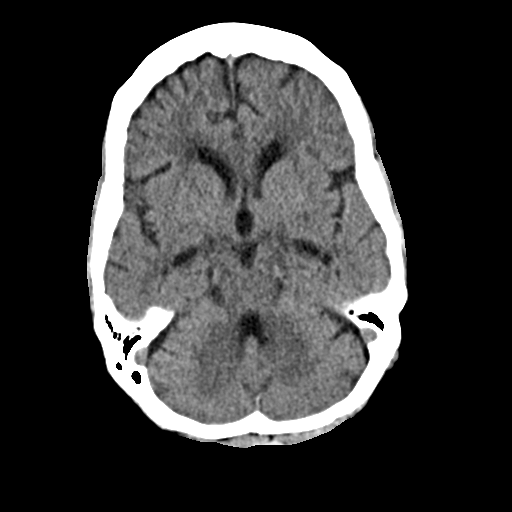
[im 16/35  brain]
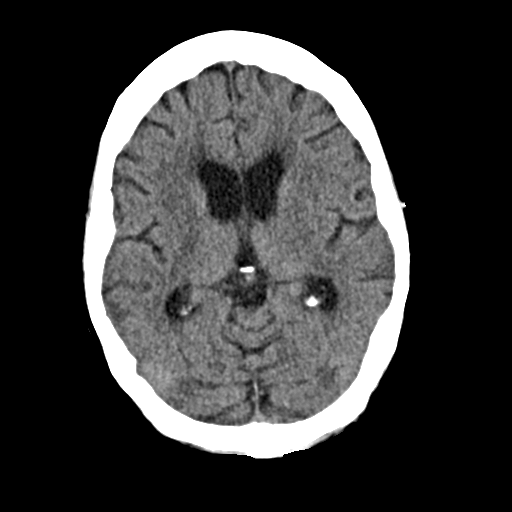
[im 16/35  bone]
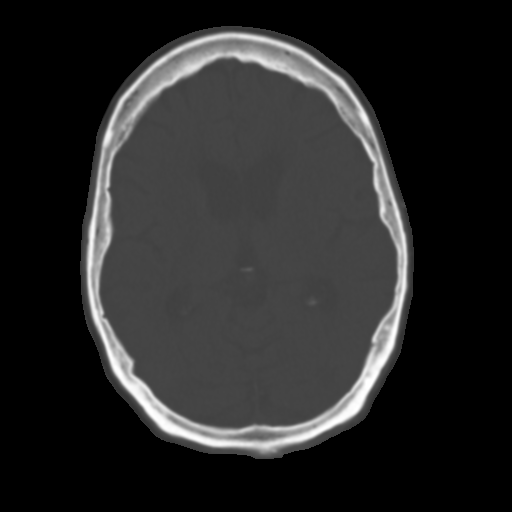
[im 19/35  brain]
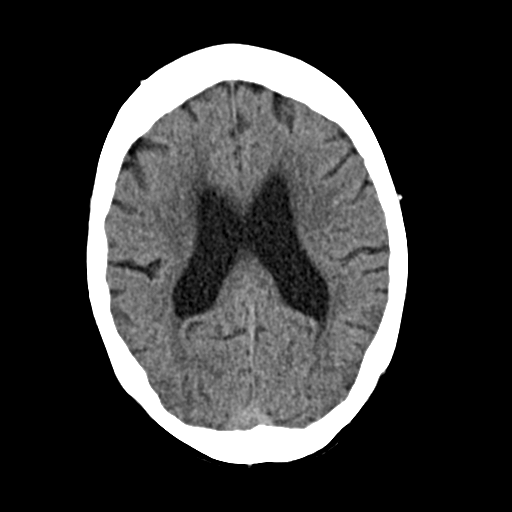
[im 23/35  brain]
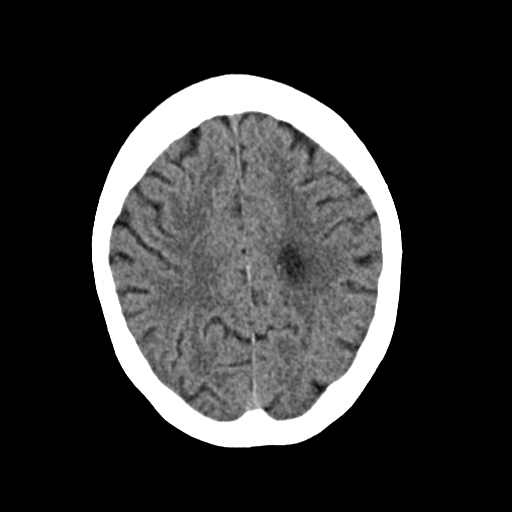
[im 26/35  brain]
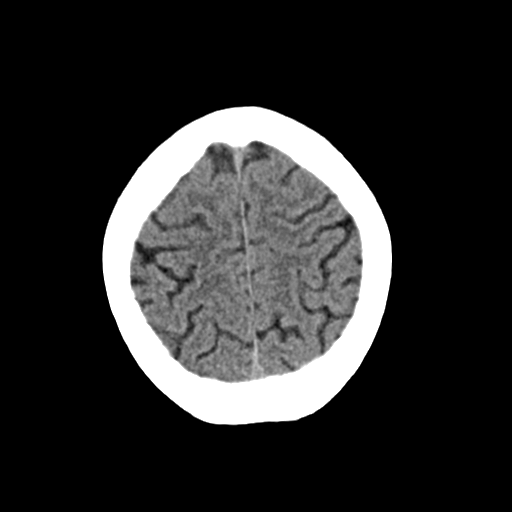
[im 29/35  brain]
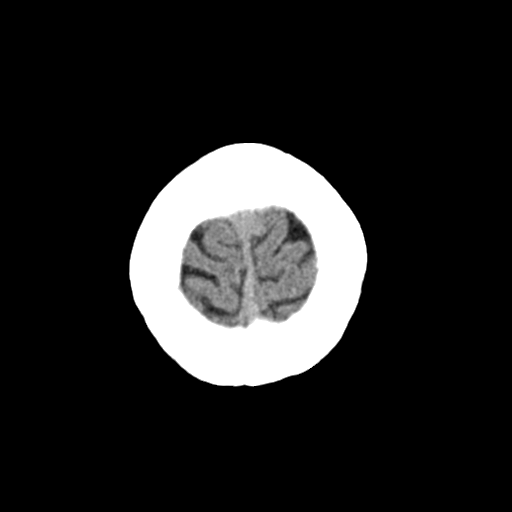
[im 29/35  bone]
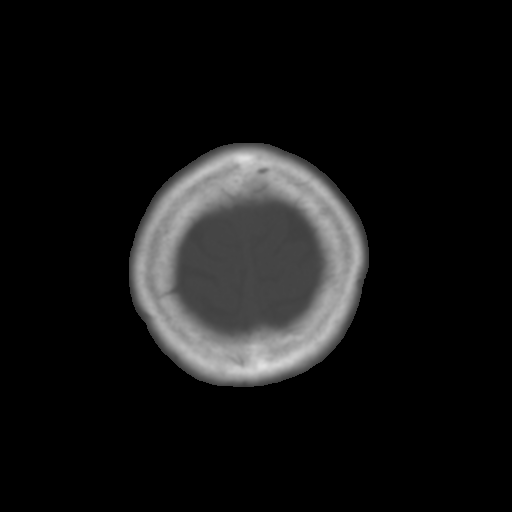
[im 32/35  brain]
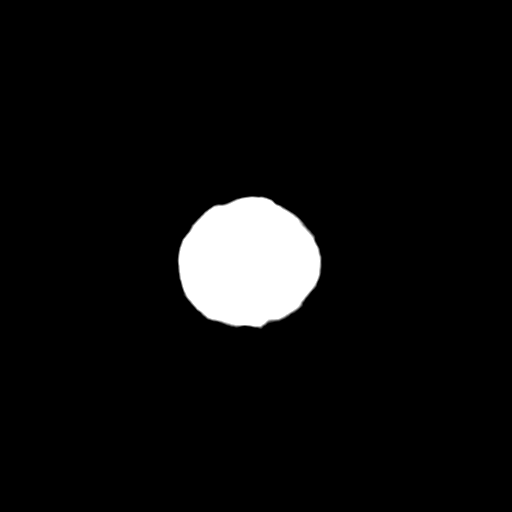

[Series 4: coronal soft · coronal · 0.36mm/px · 3 of 68 slices shown]
[im 23/68  brain]
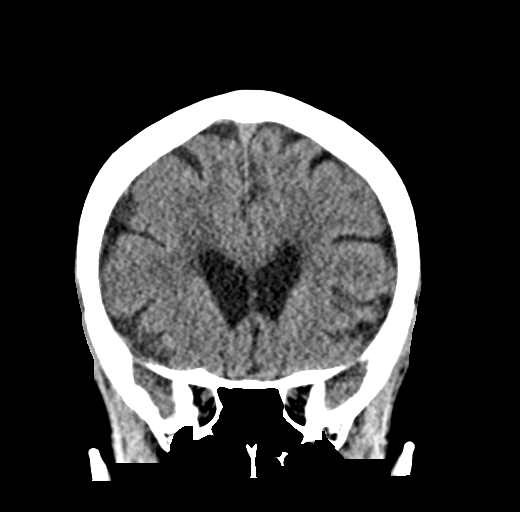
[im 30/68  brain]
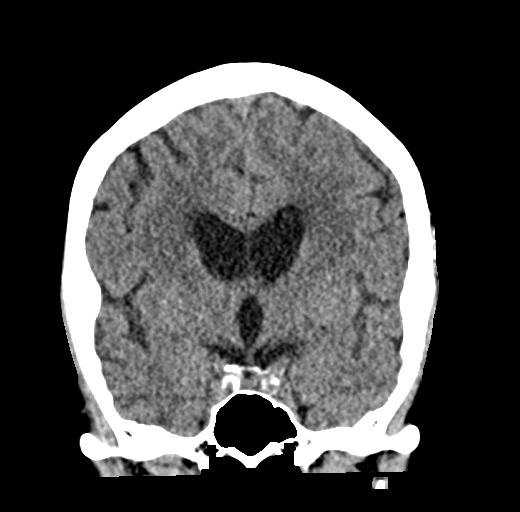
[im 38/68  brain]
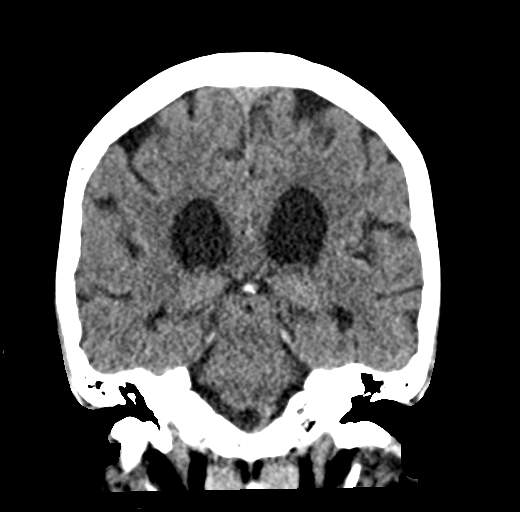

[Series 5: sag soft · sagittal · 0.36mm/px · 3 of 58 slices shown]
[im 20/58  brain]
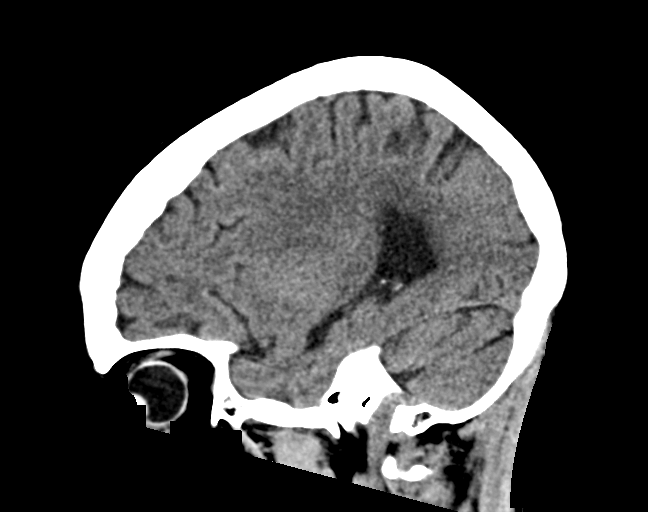
[im 29/58  brain]
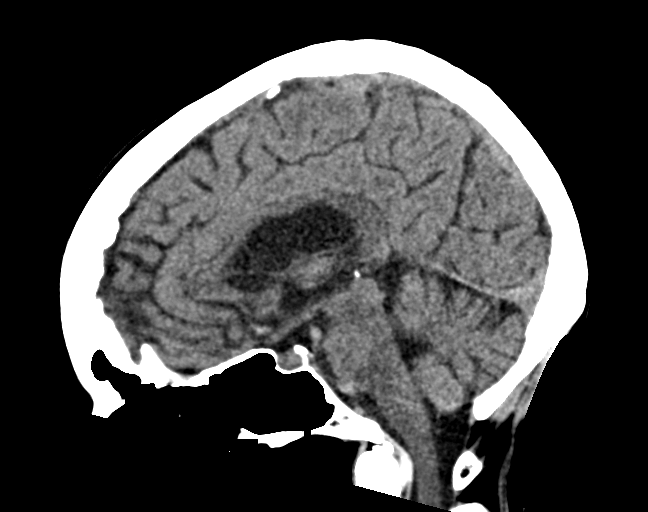
[im 39/58  brain]
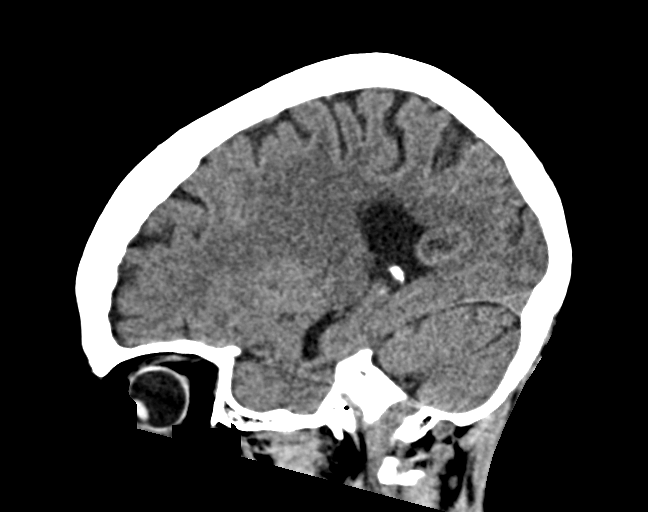

[16 of 47 positions shown; findings below may reference images not displayed]

FINDINGS: Brain: No evidence of acute infarction, hemorrhage, hydrocephalus,
extra-axial collection or mass lesion/mass effect.

Vascular: No hyperdense vessel or unexpected calcification.

Skull: Normal. Negative for fracture or focal lesion.

Sinuses/Orbits: No acute finding.

Other: None.
IMPRESSION: Normal head CT.

## 2020-10-15 ENCOUNTER — Ambulatory Visit (INDEPENDENT_AMBULATORY_CARE_PROVIDER_SITE_OTHER): Payer: Medicare Other | Admitting: Psychology

## 2020-10-15 ENCOUNTER — Other Ambulatory Visit: Payer: Self-pay

## 2020-10-15 DIAGNOSIS — F4310 Post-traumatic stress disorder, unspecified: Secondary | ICD-10-CM | POA: Diagnosis not present

## 2020-10-22 ENCOUNTER — Other Ambulatory Visit: Payer: Self-pay

## 2020-10-22 ENCOUNTER — Ambulatory Visit (INDEPENDENT_AMBULATORY_CARE_PROVIDER_SITE_OTHER): Payer: Medicare Other | Admitting: Psychology

## 2020-10-22 DIAGNOSIS — F4310 Post-traumatic stress disorder, unspecified: Secondary | ICD-10-CM | POA: Diagnosis not present

## 2020-10-29 ENCOUNTER — Ambulatory Visit (INDEPENDENT_AMBULATORY_CARE_PROVIDER_SITE_OTHER): Payer: Medicare Other | Admitting: Psychology

## 2020-10-29 ENCOUNTER — Other Ambulatory Visit: Payer: Self-pay

## 2020-10-29 DIAGNOSIS — F4310 Post-traumatic stress disorder, unspecified: Secondary | ICD-10-CM | POA: Diagnosis not present

## 2020-11-02 ENCOUNTER — Telehealth: Payer: Self-pay

## 2020-11-02 NOTE — Telephone Encounter (Signed)
Noted  

## 2020-11-02 NOTE — Telephone Encounter (Signed)
Prior Approval RENEWAL received today due to expiration on 10/13/2020 for DIAZEPAM 5 MG. PA submitted but determination from [facility] Part D only extended to 12/02/2020.  Medicare was concerned about long term use.

## 2020-11-05 ENCOUNTER — Other Ambulatory Visit: Payer: Self-pay

## 2020-11-05 ENCOUNTER — Ambulatory Visit (INDEPENDENT_AMBULATORY_CARE_PROVIDER_SITE_OTHER): Payer: Medicare Other | Admitting: Psychology

## 2020-11-05 DIAGNOSIS — F4310 Post-traumatic stress disorder, unspecified: Secondary | ICD-10-CM

## 2020-11-12 ENCOUNTER — Other Ambulatory Visit: Payer: Self-pay

## 2020-11-12 ENCOUNTER — Ambulatory Visit (INDEPENDENT_AMBULATORY_CARE_PROVIDER_SITE_OTHER): Payer: Medicare Other | Admitting: Psychology

## 2020-11-12 DIAGNOSIS — F4310 Post-traumatic stress disorder, unspecified: Secondary | ICD-10-CM

## 2020-11-19 ENCOUNTER — Ambulatory Visit (INDEPENDENT_AMBULATORY_CARE_PROVIDER_SITE_OTHER): Payer: Medicare Other | Admitting: Psychology

## 2020-11-19 DIAGNOSIS — F4310 Post-traumatic stress disorder, unspecified: Secondary | ICD-10-CM

## 2020-11-24 ENCOUNTER — Ambulatory Visit (INDEPENDENT_AMBULATORY_CARE_PROVIDER_SITE_OTHER): Payer: Medicare Other | Admitting: Family Medicine

## 2020-11-24 ENCOUNTER — Other Ambulatory Visit: Payer: Self-pay

## 2020-11-24 DIAGNOSIS — F4310 Post-traumatic stress disorder, unspecified: Secondary | ICD-10-CM

## 2020-11-24 DIAGNOSIS — R002 Palpitations: Secondary | ICD-10-CM

## 2020-11-24 DIAGNOSIS — G4700 Insomnia, unspecified: Secondary | ICD-10-CM | POA: Diagnosis not present

## 2020-11-24 DIAGNOSIS — U099 Post covid-19 condition, unspecified: Secondary | ICD-10-CM

## 2020-11-24 DIAGNOSIS — R1031 Right lower quadrant pain: Secondary | ICD-10-CM

## 2020-11-24 NOTE — Patient Instructions (Addendum)
Consider protein powders such as Collagen, Yellow Pea powder, Whey powder, Carnation Instant drink, other protein drinks or powders could also help. MINDBODYGREEN collagen type powder  Magnesium Glycinate for bowels and sleep  Constipation, Adult Constipation is when a person has fewer than three bowel movements in a week, has difficulty having a bowel movement, or has stools (feces) that are dry, hard, or larger than normal. Constipation may be caused by an underlying condition. It may become worse with age if a person takes certainmedicines and does not take in enough fluids. Follow these instructions at home: Eating and drinking  Eat foods that have a lot of fiber, such as beans, whole grains, and fresh fruits and vegetables. Limit foods that are low in fiber and high in fat and processed sugars, such as fried or sweet foods. These include french fries, hamburgers, cookies, candies, and soda. Drink enough fluid to keep your urine pale yellow.  General instructions Exercise regularly or as told by your health care provider. Try to do 150 minutes of moderate exercise each week. Use the bathroom when you have the urge to go. Do not hold it in. Take over-the-counter and prescription medicines only as told by your health care provider. This includes any fiber supplements. During bowel movements: Practice deep breathing while relaxing the lower abdomen. Practice pelvic floor relaxation. Watch your condition for any changes. Let your health care provider know about them. Keep all follow-up visits as told by your health care provider. This is important. Contact a health care provider if: You have pain that gets worse. You have a fever. You do not have a bowel movement after 4 days. You vomit. You are not hungry or you lose weight. You are bleeding from the opening between the buttocks (anus). You have thin, pencil-like stools. Get help right away if: You have a fever and your symptoms  suddenly get worse. You leak stool or have blood in your stool. Your abdomen is bloated. You have severe pain in your abdomen. You feel dizzy or you faint. Summary Constipation is when a person has fewer than three bowel movements in a week, has difficulty having a bowel movement, or has stools (feces) that are dry, hard, or larger than normal. Eat foods that have a lot of fiber, such as beans, whole grains, and fresh fruits and vegetables. Drink enough fluid to keep your urine pale yellow. Take over-the-counter and prescription medicines only as told by your health care provider. This includes any fiber supplements. This information is not intended to replace advice given to you by your health care provider. Make sure you discuss any questions you have with your healthcare provider. Document Revised: 02/13/2019 Document Reviewed: 02/13/2019 Elsevier Patient Education  2022 [employer].

## 2020-11-24 NOTE — Assessment & Plan Note (Signed)
She believes she had COVID prior to March 2020 and she now struggles with long term memory concerns, fatigue, myalgias, anxiety. Her memory concerns were the worst in the beginning but are improving slowly

## 2020-11-24 NOTE — Progress Notes (Signed)
Patient ID: Dawn Simmons, female    DOB: 04-17-1945  Age: 75 y.o. MRN: [id number]    Subjective:  Subjective  HPI Dawn Simmons presents for office visit today for follow up on insomnia and PTSD. She states that she is still doing EMDR therapy which is helping her. She hurt her back while working in the yard. She states that working in her garden is very therapeutic to her. She states that she still struggles with memory retention. She states that she needs assistance while driving due to her memory issues. Denies CP/palp/SOB/HA/congestion/fevers/GI or GU c/o. Taking meds as prescribed. She reports that diazepam 5 mg helps with her sleep most of the time. She states that sometimes she feels RLQ pain.   Review of Systems  Constitutional:  Negative for chills, fatigue and fever.  HENT:  Negative for congestion, rhinorrhea, sinus pressure, sinus pain, sore throat and trouble swallowing.   Eyes:  Negative for pain.  Respiratory:  Negative for cough and shortness of breath.   Cardiovascular:  Negative for chest pain, palpitations and leg swelling.  Gastrointestinal:  Negative for abdominal pain, blood in stool, diarrhea, nausea and vomiting.  Genitourinary:  Negative for decreased urine volume, flank pain, frequency, vaginal bleeding and vaginal discharge.  Musculoskeletal:  Negative for back pain.  Neurological:  Negative for headaches.   History Past Medical History:  Diagnosis Date   H/O fibromyalgia    H/O insomnia    H/O measles    H/O rubella    H/O syncope    History of chicken pox 09/15/2019   History of palpitations    Hx of mumps    Mitral valve prolapse    Palpitations    Syncope    Von Willebrand disease (HCC)     She has a past surgical history that includes No past surgeries.   Her family history includes Alcohol abuse in her father; Drug abuse in her son; Emphysema in her father; Heart attack in her maternal grandfather, maternal uncle, and paternal grandfather; Heart  failure in her maternal grandmother and paternal grandmother; OCD in her son; Stroke in her mother.She reports that she quit smoking about 48 years ago. She has a 5.00 pack-year smoking history. She has never used smokeless tobacco. She reports current alcohol use. She reports that she does not use drugs.  Current Outpatient Medications on File Prior to Visit  Medication Sig Dispense Refill   Ascorbic Acid (VITAMIN C) 100 MG tablet Take 100 mg by mouth daily.     Calcium-Magnesium 200-100 MG TABS Take 4 tablets by mouth daily. 2 tablets in the morning and 2 tablets in the evening     ergocalciferol (DRISDOL) 200 MCG/ML drops Take by mouth daily. PURE brand     loratadine (CLARITIN) 10 MG tablet Take 10 mg by mouth daily.     MAGNESIUM PO Take 200 mg by mouth at bedtime.     Multiple Vitamin (MULTIVITAMIN) capsule Take 1 capsule by mouth daily.     Omega-3 1000 MG CAPS Take by mouth.     TURMERIC PO Take by mouth.     No current facility-administered medications on file prior to visit.     Objective:  Objective  Physical Exam Constitutional:      General: She is not in acute distress.    Appearance: Normal appearance. She is not ill-appearing or toxic-appearing.  HENT:     Head: Normocephalic and atraumatic.     Right Ear: Tympanic membrane, ear canal and external  ear normal.     Left Ear: Tympanic membrane, ear canal and external ear normal.     Nose: No congestion or rhinorrhea.  Eyes:     Extraocular Movements: Extraocular movements intact.     Pupils: Pupils are equal, round, and reactive to light.  Cardiovascular:     Rate and Rhythm: Normal rate and regular rhythm.     Pulses: Normal pulses.     Heart sounds: Normal heart sounds. No murmur heard. Pulmonary:     Effort: Pulmonary effort is normal. No respiratory distress.     Breath sounds: Normal breath sounds. No wheezing, rhonchi or rales.  Abdominal:     General: Bowel sounds are normal.     Palpations: Abdomen is soft.  There is no mass.     Tenderness: no abdominal tenderness There is no guarding.     Hernia: No hernia is present.  Musculoskeletal:        General: Normal range of motion.     Cervical back: Normal range of motion and neck supple.  Skin:    General: Skin is warm and dry.  Neurological:     Mental Status: She is alert and oriented to person, place, and time.  Psychiatric:        Behavior: Behavior normal.   BP 100/64   Pulse 78   Temp 98.3 F (36.8 C)   Resp 16   Wt 123 lb 3.2 oz (55.9 kg)   SpO2 98%   BMI 20.50 kg/m  Wt Readings from Last 3 Encounters:  11/24/20 123 lb 3.2 oz (55.9 kg)  08/04/20 121 lb 6.4 oz (55.1 kg)  02/04/20 125 lb 6.4 oz (56.9 kg)     Lab Results  Component Value Date   WBC 4.5 01/10/2020   HGB 12.8 01/10/2020   HCT 37.7 01/10/2020   PLT 297 01/10/2020   GLUCOSE 91 01/10/2020   ALT 13 01/10/2020   AST 20 01/10/2020   NA 138 01/10/2020   K 4.2 01/10/2020   CL 105 01/10/2020   CREATININE 0.97 (H) 01/10/2020   BUN 13 01/10/2020   CO2 24 01/10/2020   TSH 2.23 09/12/2019    DG Bone Density  Result Date: 08/24/2020 EXAM: DUAL X-RAY ABSORPTIOMETRY (DXA) FOR BONE MINERAL DENSITY IMPRESSION: Referring Physician:  Bradd Canary Your patient completed a bone mineral density test using GE Lunar iDXA system (analysis version: 16). Technologist: ALW PATIENT: Name:Dawn Simmons Patient SJ:[id number] Birth Date:August 11, 1947Height:65.0 in. TML:YYTKPTWSFKCLEX:51/70/0174BSWHQP:591.6 lbs. Indications:Advanced Age, Caucasian, Estrogen Deficency, Post Menopausal, ProzacFractures: Treatments:Calcium, Vitamin D ASSESSMENT: The BMD measured at AP Spine L1-L4 is 0.710 g/cm2 with a T-score of -3.9. This patient is considered osteoporotic according to World Health Organization [facility]) criteria. The scan quality is good. SiteRegionMeasured DateMeasured AgeYABMDSignificant CHANGE T-score AP SpineL1-L405/16/202273.7-3.90.710 g/cm2 DualFemurNeck Right05/16/202273.7-2.70.665 g/cm2  DualFemurTotal Mean05/16/202273.7-2.30.719 g/cm2 World Health Organization [facility]) criteria for post-menopausal, Caucasian Women: NormalT-score at or above -1 SD OsteopeniaT-score between -1 and -2.5 SD OsteoporosisT-score at or below -2.5 SD RECOMMENDATION: 1. All patients should optimize calcium and vitamin D inake. 2. Consider FDA approved medical therapies in postmenopausal women and men aged 78 years and older, based on the following: a. A hip or vetebral (clinical or morphometric) fracture b. T-score = -2.5 at the femoral neck or spine after appropriate evaluation to exclude secondary causes c. Low bone mass (T-score between -1.0 and -2.5 at the femoral neck or spine) and a 10- year probability of a hip fracure = 3% or a 10 year probabilty of a  major osteoporosis-related fracture = 20% based on the [place]-adapted WHO algorithm. 3. Clinician judgement and/or patient preference may indicate treatment fo people with10-year fracture probabilities above or below these levels. FOLLOW-UP: Patients with diagnosis of osteoporosis or at high risk for fracture should have regular bone mineral density tests.For patients eligible for medicare routine testing is allowed once every 2 years. The testing frequency can be increased to one year for patients who have rapidly progressing disease, those who are receiving or discontinuing medical therapy to restore bone mass, or have additional risk factors. I have reviewed this study and agree with the findings. [facility] Electronically Signed   By: Charlett Nose M.D.   On: 08/24/2020 16:39     Assessment & Plan:  Plan    No orders of the defined types were placed in this encounter.   Problem List Items Addressed This Visit     Palpitations    No recent exacerbation.      Insomnia    dEncouraged good sleep hygiene such as dark, quiet room. No blue/green glowing lights such as computer screens in bedroom. No alcohol or stimulants in evening. Cut down on  caffeine as able. Regular exercise is helpful but not just prior to bed time. Using Relaxium sleep  prn      PTSD (post-traumatic stress disorder)    She is stable on Fluoxetine and Diazepam prn      Right groin pain    Encouraged moist heat and gentle stretching as tolerated. May try NSAIDs and prescription meds as directed and report if symptoms worsen or seek immediate care      COVID-19 long hauler    She believes she had COVID prior to March 2020 and she now struggles with long term memory concerns, fatigue, myalgias, anxiety. Her memory concerns were the worst in the beginning but are improving slowly       Follow-up: Return in about 3 months (around 02/24/2021).  I, Billie Lade, acting as a scribe for Danise Edge, MD, have documented all relevent documentation on behalf of Danise Edge, MD, as directed by Danise Edge, MD while in the presence of Danise Edge, MD.  I, Bradd Canary, MD personally performed the services described in this documentation. All medical record entries made by the scribe were at my direction and in my presence. I have reviewed the chart and agree that the record reflects my personal performance and is accurate and complete

## 2020-11-25 ENCOUNTER — Encounter: Payer: Self-pay | Admitting: Psychiatry

## 2020-11-25 ENCOUNTER — Ambulatory Visit (INDEPENDENT_AMBULATORY_CARE_PROVIDER_SITE_OTHER): Payer: Medicare Other | Admitting: Psychiatry

## 2020-11-25 DIAGNOSIS — F4310 Post-traumatic stress disorder, unspecified: Secondary | ICD-10-CM | POA: Diagnosis not present

## 2020-11-25 DIAGNOSIS — G4700 Insomnia, unspecified: Secondary | ICD-10-CM

## 2020-11-25 MED ORDER — FLUOXETINE HCL 10 MG PO CAPS: 20.0000 mg | ORAL_CAPSULE | Freq: Every morning | ORAL | 1 refills | Status: DC

## 2020-11-25 MED ORDER — DIAZEPAM 5 MG PO TABS: ORAL_TABLET | ORAL | 0 refills | Status: DC

## 2020-11-25 NOTE — Progress Notes (Signed)
Dawn Simmons [id number] 08-08-1945 75 y.o.  Subjective:   Patient ID:  Dawn Simmons is a 75 y.o. (DOB 02-05-1946) female.  Chief Complaint:  Chief Complaint  Patient presents with   Follow-up    Anxiety and insomnia    HPI [name] presents to the office today for follow-up of anxiety and insomnia. She reports family and financial stressors in response to daughters' separation and divorce. She reports, "I was never prepared for all this." Recently learned that another daughter has been having significant marital issues. She reports worry about family stressors. She denies panic s/s. She reports that she is "not really" depressed. She reports that she was unable to sleep at all about a week ago. She reports that she has been sleeping well since then. She reports "it takes me awhile to unwind" and will read until she falls asleep. She reports that she has "never had a good appetite." Husband cooks dinner most nights and reports that she does not remember how to cook. She reports that she is tired often. She reports in the past she was energized by being with people, doing things, and running errands and is no longer able to do this. Denies SI.   She reports, "I am getting some memory back." She has been able to work some word puzzles but more simple word puzzles than she is used to. She reports that she occasionally will lose her train of thought mid-sentence.   Reports that they had their house painted and this was not done properly and exterior of house was "ruined."   Son is doing well and is at home with his 2 children. Reports that her husband has been supportive. She reports that she and her husband watch some shows together.   Continues to see Merry Lofty, LCSW for therapy.   Diazepam last filled 11/02/20.   Past Psychiatric Medication Trials: (She reports that she is very sensitive to medication) Diazepam- Prescribed as need with limited improvement.  Prozac- reports that  she has to take Prozac 20 mg in divided doses.   PHQ2-9    Flowsheet Row Clinical Support from 12/13/2019 in [facility]  PHQ-2 Total Score 0        Review of Systems:  Review of Systems  Musculoskeletal:  Negative for gait problem.  Neurological:  Negative for syncope.  Psychiatric/Behavioral:         Please refer to HPI   Medications: I have reviewed the patient's current medications.  Current Outpatient Medications  Medication Sig Dispense Refill   Ascorbic Acid (VITAMIN C) 100 MG tablet Take 100 mg by mouth daily.     Calcium-Magnesium 200-100 MG TABS Take 4 tablets by mouth daily. 2 tablets in the morning and 2 tablets in the evening     [START ON 01/25/2021] diazepam (VALIUM) 5 MG tablet Take 1/2-1 tablet po QHS 90 tablet 0   ergocalciferol (DRISDOL) 200 MCG/ML drops Take by mouth daily. PURE brand     FLUoxetine (PROZAC) 10 MG capsule Take 2 capsules (20 mg total) by mouth every morning. 180 capsule 1   loratadine (CLARITIN) 10 MG tablet Take 10 mg by mouth daily.     MAGNESIUM PO Take 200 mg by mouth at bedtime.     Multiple Vitamin (MULTIVITAMIN) capsule Take 1 capsule by mouth daily.     Omega-3 1000 MG CAPS Take by mouth.     TURMERIC PO Take by mouth.     No  current facility-administered medications for this visit.    Medication Side Effects: None  Allergies:  Allergies  Allergen Reactions   Doxycycline Swelling   Tetanus Toxoids Other (See Comments)    PAIN IN NECK AND FACE   Flexeril [Cyclobenzaprine] Other (See Comments)    Past Medical History:  Diagnosis Date   H/O fibromyalgia    H/O insomnia    H/O measles    H/O rubella    H/O syncope    History of chicken pox 09/15/2019   History of palpitations    Hx of mumps    Mitral valve prolapse    Palpitations    Syncope    Von Willebrand disease (HCC)     Past Medical History, Surgical history, Social history, and Family history were reviewed and updated  as appropriate.   Please see review of systems for further details on the patient's review from today.   Objective:   Physical Exam:  Wt 123 lb (55.8 kg)   BMI 20.47 kg/m   Physical Exam Constitutional:      General: She is not in acute distress. Musculoskeletal:        General: No deformity.  Neurological:     Mental Status: She is alert and oriented to person, place, and time.     Coordination: Coordination normal.  Psychiatric:        Attention and Perception: Attention and perception normal. She does not perceive auditory or visual hallucinations.        Mood and Affect: Mood is anxious. Mood is not depressed. Affect is not labile, blunt, angry or inappropriate.        Speech: Speech normal.        Behavior: Behavior normal.        Thought Content: Thought content normal. Thought content is not paranoid or delusional. Thought content does not include homicidal or suicidal ideation. Thought content does not include homicidal or suicidal plan.        Cognition and Memory: Cognition normal. She exhibits impaired recent memory.        Judgment: Judgment normal.     Comments: Insight intact Anxious in response to family stressors    Lab Review:     Component Value Date/Time   NA 138 01/10/2020 1358   K 4.2 01/10/2020 1358   CL 105 01/10/2020 1358   CO2 24 01/10/2020 1358   GLUCOSE 91 01/10/2020 1358   BUN 13 01/10/2020 1358   CREATININE 0.97 (H) 01/10/2020 1358   CALCIUM 9.9 01/10/2020 1358   PROT 6.6 01/10/2020 1358   ALBUMIN 4.4 09/12/2019 1153   AST 20 01/10/2020 1358   ALT 13 01/10/2020 1358   ALKPHOS 54 09/12/2019 1153   BILITOT 0.4 01/10/2020 1358       Component Value Date/Time   WBC 4.5 01/10/2020 1358   RBC 4.09 01/10/2020 1358   HGB 12.8 01/10/2020 1358   HCT 37.7 01/10/2020 1358   PLT 297 01/10/2020 1358   MCV 92.2 01/10/2020 1358   MCH 31.3 01/10/2020 1358   MCHC 34.0 01/10/2020 1358   RDW 12.7 01/10/2020 1358   LYMPHSABS 1,197 01/10/2020 1358    MONOABS 0.5 10/31/2019 1509   EOSABS 198 01/10/2020 1358   BASOSABS 32 01/10/2020 1358    No results found for: POCLITH, LITHIUM   No results found for: PHENYTOIN, PHENOBARB, VALPROATE, CBMZ   .res Assessment: Plan:   Pt seen for 30 minutes and time spent discussing treatment plan.  She reports that she  was initially wanting to decrease medications, however since there have been several recent stressors she feels that she should continue current medications without changes.  She reports that she also discussed this with her PCP and PCP also recommends not reducing medications at this time. Will continue Prozac 20 mg daily for anxiety signs and symptoms. Continue diazepam 5 mg at bedtime for insomnia and anxiety. Patient to follow-up with this provider in 3 months or sooner if clinically indicated. Patient advised to contact office with any questions, adverse effects, or acute worsening in signs and symptoms.  Akeela was seen today for follow-up.  Diagnoses and all orders for this visit:  PTSD (post-traumatic stress disorder) -     diazepam (VALIUM) 5 MG tablet; Take 1/2-1 tablet po QHS -     FLUoxetine (PROZAC) 10 MG capsule; Take 2 capsules (20 mg total) by mouth every morning.  Insomnia, unspecified type -     diazepam (VALIUM) 5 MG tablet; Take 1/2-1 tablet po QHS    Please see After Visit Summary for patient specific instructions.  Future Appointments  Date Time Provider Department Center  12/03/2020  1:00 PM Cottle, Lynnell Dike, LCSW [facility] None  12/10/2020  1:00 PM Cottle, Bambi G, LCSW [facility] None  12/17/2020  1:00 PM Cottle, Bambi G, LCSW [facility] None  12/24/2020  1:00 PM Cottle, Bambi G, LCSW [facility] None  12/31/2020  1:00 PM Cottle, Bambi G, LCSW [facility] None  01/07/2021  1:00 PM Cottle, Bambi G, LCSW [facility] None  02/25/2021 11:00 AM Corie Chiquito, PMHNP [facility] None  03/11/2021  3:00 PM Bradd Canary, MD [facility]    No orders of the defined types were  placed in this encounter.   -------------------------------

## 2020-11-29 NOTE — Assessment & Plan Note (Signed)
She is stable on Fluoxetine and Diazepam prn

## 2020-11-29 NOTE — Assessment & Plan Note (Signed)
No recent exacerbation 

## 2020-11-29 NOTE — Assessment & Plan Note (Addendum)
dEncouraged good sleep hygiene such as dark, quiet room. No blue/green glowing lights such as computer screens in bedroom. No alcohol or stimulants in evening. Cut down on caffeine as able. Regular exercise is helpful but not just prior to bed time. Using Relaxium sleep  prn

## 2020-11-29 NOTE — Assessment & Plan Note (Signed)
Encouraged moist heat and gentle stretching as tolerated. May try NSAIDs and prescription meds as directed and report if symptoms worsen or seek immediate care 

## 2020-12-03 ENCOUNTER — Ambulatory Visit (INDEPENDENT_AMBULATORY_CARE_PROVIDER_SITE_OTHER): Payer: Medicare Other | Admitting: Psychology

## 2020-12-03 DIAGNOSIS — F4310 Post-traumatic stress disorder, unspecified: Secondary | ICD-10-CM | POA: Diagnosis not present

## 2020-12-10 ENCOUNTER — Ambulatory Visit (INDEPENDENT_AMBULATORY_CARE_PROVIDER_SITE_OTHER): Payer: Medicare Other | Admitting: Psychology

## 2020-12-10 DIAGNOSIS — F4310 Post-traumatic stress disorder, unspecified: Secondary | ICD-10-CM

## 2020-12-11 DIAGNOSIS — Z20822 Contact with and (suspected) exposure to covid-19: Secondary | ICD-10-CM | POA: Diagnosis not present

## 2020-12-17 ENCOUNTER — Ambulatory Visit (INDEPENDENT_AMBULATORY_CARE_PROVIDER_SITE_OTHER): Payer: Medicare Other | Admitting: Psychology

## 2020-12-17 DIAGNOSIS — F4310 Post-traumatic stress disorder, unspecified: Secondary | ICD-10-CM

## 2020-12-24 ENCOUNTER — Ambulatory Visit (INDEPENDENT_AMBULATORY_CARE_PROVIDER_SITE_OTHER): Payer: Medicare Other | Admitting: Psychology

## 2020-12-24 ENCOUNTER — Other Ambulatory Visit: Payer: Self-pay

## 2020-12-24 DIAGNOSIS — F4310 Post-traumatic stress disorder, unspecified: Secondary | ICD-10-CM

## 2020-12-31 ENCOUNTER — Ambulatory Visit (INDEPENDENT_AMBULATORY_CARE_PROVIDER_SITE_OTHER): Payer: Medicare Other | Admitting: Psychology

## 2020-12-31 ENCOUNTER — Other Ambulatory Visit: Payer: Self-pay

## 2020-12-31 DIAGNOSIS — F4310 Post-traumatic stress disorder, unspecified: Secondary | ICD-10-CM | POA: Diagnosis not present

## 2021-01-07 ENCOUNTER — Other Ambulatory Visit: Payer: Self-pay

## 2021-01-07 ENCOUNTER — Ambulatory Visit (INDEPENDENT_AMBULATORY_CARE_PROVIDER_SITE_OTHER): Payer: Medicare Other | Admitting: Psychology

## 2021-01-07 DIAGNOSIS — F4310 Post-traumatic stress disorder, unspecified: Secondary | ICD-10-CM | POA: Diagnosis not present

## 2021-01-09 DIAGNOSIS — Z20822 Contact with and (suspected) exposure to covid-19: Secondary | ICD-10-CM | POA: Diagnosis not present

## 2021-01-11 ENCOUNTER — Other Ambulatory Visit: Payer: Self-pay | Admitting: Family Medicine

## 2021-01-11 ENCOUNTER — Telehealth: Payer: Self-pay | Admitting: Family Medicine

## 2021-01-11 DIAGNOSIS — L710 Perioral dermatitis: Secondary | ICD-10-CM

## 2021-01-11 NOTE — Telephone Encounter (Signed)
Patient would like for Dr. Perley Jain to refer her to a dermatologist for the rash that is around her mouth. Please advice.

## 2021-01-12 NOTE — Telephone Encounter (Signed)
done

## 2021-01-14 ENCOUNTER — Ambulatory Visit (INDEPENDENT_AMBULATORY_CARE_PROVIDER_SITE_OTHER): Payer: Medicare Other | Admitting: Psychology

## 2021-01-14 ENCOUNTER — Other Ambulatory Visit: Payer: Self-pay

## 2021-01-14 DIAGNOSIS — F4310 Post-traumatic stress disorder, unspecified: Secondary | ICD-10-CM

## 2021-01-21 ENCOUNTER — Ambulatory Visit (INDEPENDENT_AMBULATORY_CARE_PROVIDER_SITE_OTHER): Payer: Medicare Other | Admitting: Psychology

## 2021-01-21 ENCOUNTER — Other Ambulatory Visit: Payer: Self-pay

## 2021-01-21 DIAGNOSIS — F4310 Post-traumatic stress disorder, unspecified: Secondary | ICD-10-CM | POA: Diagnosis not present

## 2021-01-21 DIAGNOSIS — K130 Diseases of lips: Secondary | ICD-10-CM | POA: Diagnosis not present

## 2021-01-25 ENCOUNTER — Other Ambulatory Visit: Payer: Self-pay

## 2021-01-25 ENCOUNTER — Ambulatory Visit (INDEPENDENT_AMBULATORY_CARE_PROVIDER_SITE_OTHER): Payer: Medicare Other | Admitting: Family Medicine

## 2021-01-25 ENCOUNTER — Encounter: Payer: Self-pay | Admitting: Family Medicine

## 2021-01-25 VITALS — BP 102/62 | HR 71 | Temp 97.7°F | Resp 16 | Wt 123.2 lb

## 2021-01-25 DIAGNOSIS — G4700 Insomnia, unspecified: Secondary | ICD-10-CM

## 2021-01-25 DIAGNOSIS — R002 Palpitations: Secondary | ICD-10-CM

## 2021-01-25 DIAGNOSIS — Z23 Encounter for immunization: Secondary | ICD-10-CM

## 2021-01-25 DIAGNOSIS — U099 Post covid-19 condition, unspecified: Secondary | ICD-10-CM

## 2021-01-25 DIAGNOSIS — D72819 Decreased white blood cell count, unspecified: Secondary | ICD-10-CM

## 2021-01-25 DIAGNOSIS — F4310 Post-traumatic stress disorder, unspecified: Secondary | ICD-10-CM

## 2021-01-25 MED ORDER — TRIAMCINOLONE ACETONIDE 0.1 % EX CREA: 1.0000 "application " | TOPICAL_CREAM | Freq: Two times a day (BID) | CUTANEOUS | 1 refills | Status: DC

## 2021-01-25 MED ORDER — TRIAMCINOLONE ACETONIDE 0.1 % EX CREA: 1.0000 "application " | TOPICAL_CREAM | Freq: Two times a day (BID) | CUTANEOUS | Status: DC

## 2021-01-25 MED ORDER — NYSTATIN 100000 UNIT/ML MT SUSP: OROMUCOSAL | 1 refills | Status: DC

## 2021-01-25 NOTE — Assessment & Plan Note (Signed)
Encouraged good sleep hygiene such as dark, quiet room. No blue/green glowing lights such as computer screens in bedroom. No alcohol or stimulants in evening. Cut down on caffeine as able. Regular exercise is helpful but not just prior to bed time. Diazeoam prn is helpful

## 2021-01-25 NOTE — Patient Instructions (Addendum)
Triclosan and teflon in toothpaste and floss, avoid  Try Tom's or Hello for Toothpaste and floss with Fluoride  Chelitis is the infection at corners of the mouth.   Change the toothpaste.    Nystatin liquid to mouth first then Triamcinolone cream for corners of the mouth and hand

## 2021-01-25 NOTE — Assessment & Plan Note (Signed)
She continues to follow with counselor

## 2021-01-25 NOTE — Progress Notes (Signed)
Subjective:   By signing my name below, I, [name], attest that this documentation has been prepared under the direction and in the presence of Bradd Canary, MD. 01/25/2021     Patient ID: Dawn Simmons, female    DOB: 12-29-1945, 75 y.o.   MRN: [id number]  Chief Complaint  Patient presents with   Follow-up    HPI Patient is in today for an office visit. She is accompanied by her husband.  She feels like she is not progressing and she is "stuck". Her symptoms have not changed.   She recently saw her dermatologist for a rash on her face. She said it is intermittent but there is no rash in her mouth. She was prescribed anti-histamines.  She has not gotten the shingles vaccine. She has 2 Covid-19 vaccines at this time and is reluctant to get any more. She is willing to get the flu vaccine today.  Past Medical History:  Diagnosis Date   H/O fibromyalgia    H/O insomnia    H/O measles    H/O rubella    H/O syncope    History of chicken pox 09/15/2019   History of palpitations    Hx of mumps    Mitral valve prolapse    Palpitations    Syncope    Von Willebrand disease     Past Surgical History:  Procedure Laterality Date   NO PAST SURGERIES      Family History  Problem Relation Age of Onset   Stroke Mother    Emphysema Father        smoked   Alcohol abuse Father    Heart attack Maternal Grandfather    Heart attack Paternal Grandfather    Heart attack Maternal Uncle    Heart failure Maternal Grandmother    Heart failure Paternal Grandmother    OCD Son    Drug abuse Son    Seizures Neg Hx     Social History   Socioeconomic History   Marital status: Married    Spouse name: tony   Number of children: 4   Years of education: 12   Highest education level: Not on file  Occupational History   Not on file  Tobacco Use   Smoking status: Former    Packs/day: 0.50    Years: 10.00    Pack years: 5.00    Types: Cigarettes    Quit date: 04/27/1972    Years  since quitting: 48.7   Smokeless tobacco: Never  Vaping Use   Vaping Use: Never used  Substance and Sexual Activity   Alcohol use: Yes    Alcohol/week: 0.0 standard drinks    Comment: occ 1 glass wine or beer   Drug use: No   Sexual activity: Not on file  Other Topics Concern   Not on file  Social History Narrative   Lives at home with husband   Caffeine use- tea, 1 cup/day   Social Determinants of Health   Financial Resource Strain: Not on file  Food Insecurity: Not on file  Transportation Needs: Not on file  Physical Activity: Not on file  Stress: Not on file  Social Connections: Not on file  Intimate Partner Violence: Not on file    Outpatient Medications Prior to Visit  Medication Sig Dispense Refill   Ascorbic Acid (VITAMIN C) 100 MG tablet Take 100 mg by mouth daily.     Calcium-Magnesium 200-100 MG TABS Take 4 tablets by mouth daily. 2 tablets in the  morning and 2 tablets in the evening     diazepam (VALIUM) 5 MG tablet Take 1/2-1 tablet po QHS 90 tablet 0   ergocalciferol (DRISDOL) 200 MCG/ML drops Take by mouth daily. PURE brand     FLUoxetine (PROZAC) 10 MG capsule Take 2 capsules (20 mg total) by mouth every morning. 180 capsule 1   loratadine (CLARITIN) 10 MG tablet Take 10 mg by mouth daily.     MAGNESIUM PO Take 200 mg by mouth at bedtime.     Multiple Vitamin (MULTIVITAMIN) capsule Take 1 capsule by mouth daily.     Omega-3 1000 MG CAPS Take by mouth.     TURMERIC PO Take by mouth.     No facility-administered medications prior to visit.    Allergies  Allergen Reactions   Doxycycline Swelling   Tetanus Toxoids Other (See Comments)    PAIN IN NECK AND FACE   Flexeril [Cyclobenzaprine] Other (See Comments)    Review of Systems  Constitutional:  Negative for fever and malaise/fatigue.  HENT:  Negative for congestion.   Eyes:  Negative for redness.  Respiratory:  Negative for shortness of breath.   Cardiovascular:  Negative for chest pain,  palpitations and leg swelling.  Gastrointestinal:  Negative for abdominal pain, blood in stool and nausea.  Genitourinary:  Negative for dysuria and frequency.  Musculoskeletal:  Negative for falls.  Skin:  Negative for rash.  Neurological:  Negative for dizziness, loss of consciousness and headaches.  Endo/Heme/Allergies:  Negative for polydipsia.  Psychiatric/Behavioral:  Negative for depression. The patient is not nervous/anxious.       Objective:    Physical Exam Constitutional:      General: She is not in acute distress.    Appearance: She is well-developed.  HENT:     Head: Normocephalic and atraumatic.  Eyes:     Conjunctiva/sclera: Conjunctivae normal.  Neck:     Thyroid: No thyromegaly.  Cardiovascular:     Rate and Rhythm: Normal rate and regular rhythm.     Heart sounds: Normal heart sounds. No murmur heard. Pulmonary:     Effort: Pulmonary effort is normal. No respiratory distress.     Breath sounds: Normal breath sounds.  Abdominal:     General: Bowel sounds are normal. There is no distension.     Palpations: Abdomen is soft. There is no mass.     Tenderness: There is no abdominal tenderness.  Musculoskeletal:     Cervical back: Neck supple.  Lymphadenopathy:     Cervical: No cervical adenopathy.  Skin:    General: Skin is warm and dry.  Neurological:     Mental Status: She is alert and oriented to person, place, and time.  Psychiatric:        Behavior: Behavior normal.    BP 102/62   Pulse 71   Temp 97.7 F (36.5 C)   Resp 16   Wt 123 lb 3.2 oz (55.9 kg)   SpO2 98%   BMI 20.50 kg/m  Wt Readings from Last 3 Encounters:  01/25/21 123 lb 3.2 oz (55.9 kg)  11/24/20 123 lb 3.2 oz (55.9 kg)  08/04/20 121 lb 6.4 oz (55.1 kg)    Diabetic Foot Exam - Simple   No data filed    Lab Results  Component Value Date   WBC 4.5 01/10/2020   HGB 12.8 01/10/2020   HCT 37.7 01/10/2020   PLT 297 01/10/2020   GLUCOSE 91 01/10/2020   ALT 13 01/10/2020  AST 20 01/10/2020   NA 138 01/10/2020   K 4.2 01/10/2020   CL 105 01/10/2020   CREATININE 0.97 (H) 01/10/2020   BUN 13 01/10/2020   CO2 24 01/10/2020   TSH 2.23 09/12/2019    Lab Results  Component Value Date   TSH 2.23 09/12/2019   Lab Results  Component Value Date   WBC 4.5 01/10/2020   HGB 12.8 01/10/2020   HCT 37.7 01/10/2020   MCV 92.2 01/10/2020   PLT 297 01/10/2020   Lab Results  Component Value Date   NA 138 01/10/2020   K 4.2 01/10/2020   CO2 24 01/10/2020   GLUCOSE 91 01/10/2020   BUN 13 01/10/2020   CREATININE 0.97 (H) 01/10/2020   BILITOT 0.4 01/10/2020   ALKPHOS 54 09/12/2019   AST 20 01/10/2020   ALT 13 01/10/2020   PROT 6.6 01/10/2020   ALBUMIN 4.4 09/12/2019   CALCIUM 9.9 01/10/2020   GFR 59.13 (L) 09/12/2019   No results found for: CHOL No results found for: HDL No results found for: LDLCALC No results found for: TRIG No results found for: CHOLHDL No results found for: BTDV7O     Assessment & Plan:   Problem List Items Addressed This Visit     Palpitations   Relevant Orders   CBC   Comprehensive metabolic panel   TSH   Insomnia    Encouraged good sleep hygiene such as dark, quiet room. No blue/green glowing lights such as computer screens in bedroom. No alcohol or stimulants in evening. Cut down on caffeine as able. Regular exercise is helpful but not just prior to bed time. Diazeoam prn is helpful      Leukopenia   Relevant Orders   CBC   PTSD (post-traumatic stress disorder) - Primary    She continues to follow with counselor       COVID-19 long hauler    Still struggling  With long term symptoms still. She is encouraged to take the new Bivalent immunization but she is hesitant. She is given flu shot today. Discussed need for COVID shot and plan of care for 20 minutes today      Other Visit Diagnoses     Needs flu shot       Relevant Orders   Flu Vaccine QUAD High Dose(Fluad) (Completed)       Meds ordered this  encounter  Medications   nystatin (MYCOSTATIN) 100000 UNIT/ML suspension    Sig: Apply to corners of mouth three x daily    Dispense:  60 mL    Refill:  1   DISCONTD: triamcinolone cream (KENALOG) 0.1 %    Sig: Apply 1 application topically 2 (two) times daily.    Dispense:  80 g    Refill:  Q   triamcinolone cream (KENALOG) 0.1 %    Sig: Apply 1 application topically 2 (two) times daily.    Dispense:  80 g    Refill:  1    I,[name],acting as a scribe for Danise Edge, MD.,have documented all relevant documentation on the behalf of Danise Edge, MD,as directed by  Danise Edge, MD while in the presence of Danise Edge, MD.   I, Danise Edge, MD, personally preformed the services described in this documentation.  All medical record entries made by the scribe were at my direction and in my presence.  I have reviewed the chart and discharge instructions (if applicable) and agree that the record reflects my personal performance and is accurate and  complete. 01/25/2021

## 2021-01-25 NOTE — Assessment & Plan Note (Addendum)
Still struggling  With long term symptoms still. She is encouraged to take the new Bivalent immunization but she is hesitant. She is given flu shot today. Discussed need for COVID shot and plan of care for 20 minutes today

## 2021-01-26 LAB — COMPREHENSIVE METABOLIC PANEL
ALT: 15 U/L (ref 0–35)
AST: 23 U/L (ref 0–37)
Albumin: 4.6 g/dL (ref 3.5–5.2)
Alkaline Phosphatase: 67 U/L (ref 39–117)
BUN: 17 mg/dL (ref 6–23)
CO2: 25 mEq/L (ref 19–32)
Calcium: 10 mg/dL (ref 8.4–10.5)
Chloride: 106 mEq/L (ref 96–112)
Creatinine, Ser: 0.88 mg/dL (ref 0.40–1.20)
GFR: 64.86 mL/min (ref >60.00)
Glucose, Bld: 88 mg/dL (ref 70–99)
Potassium: 4 mEq/L (ref 3.5–5.1)
Sodium: 140 mEq/L (ref 135–145)
Total Bilirubin: 0.6 mg/dL (ref 0.2–1.2)
Total Protein: 6.9 g/dL (ref 6.0–8.3)

## 2021-01-26 LAB — CBC
HCT: 38.1 % (ref 36.0–46.0)
Hemoglobin: 12.9 g/dL (ref 12.0–15.0)
MCHC: 33.9 g/dL (ref 30.0–36.0)
MCV: 93.4 fl (ref 78.0–100.0)
Platelets: 272 10*3/uL (ref 150.0–400.0)
RBC: 4.08 Mil/uL (ref 3.87–5.11)
RDW: 13.1 % (ref 11.5–15.5)
WBC: 4 10*3/uL (ref 4.0–10.5)

## 2021-01-26 LAB — TSH: TSH: 1.83 u[IU]/mL (ref 0.35–5.50)

## 2021-01-28 DIAGNOSIS — Z20822 Contact with and (suspected) exposure to covid-19: Secondary | ICD-10-CM | POA: Diagnosis not present

## 2021-02-02 ENCOUNTER — Telehealth: Payer: Self-pay | Admitting: Family Medicine

## 2021-02-02 NOTE — Telephone Encounter (Signed)
[facility] is calling about documentation needed for the back and knee brace request. She states that office notes need to be faxed to [phone]. Please advise.

## 2021-02-02 NOTE — Telephone Encounter (Signed)
faxed

## 2021-02-04 ENCOUNTER — Ambulatory Visit (INDEPENDENT_AMBULATORY_CARE_PROVIDER_SITE_OTHER): Payer: Medicare Other | Admitting: Psychology

## 2021-02-04 DIAGNOSIS — F4310 Post-traumatic stress disorder, unspecified: Secondary | ICD-10-CM

## 2021-02-05 ENCOUNTER — Telehealth: Payer: Self-pay | Admitting: Family Medicine

## 2021-02-05 NOTE — Telephone Encounter (Signed)
Pt called regarding orders from [facility], she stated that she is a bit confused as to why she needs a back and knee brace. Please advise.

## 2021-02-05 NOTE — Telephone Encounter (Signed)
Dawn Simmons from [employer] is calling regarding prior auth to make sure it is approved. Please advise [phone].

## 2021-02-08 ENCOUNTER — Ambulatory Visit (INDEPENDENT_AMBULATORY_CARE_PROVIDER_SITE_OTHER): Payer: Medicare Other

## 2021-02-08 VITALS — Ht 66.0 in | Wt 123.0 lb

## 2021-02-08 DIAGNOSIS — Z0000 Encounter for general adult medical examination without abnormal findings: Secondary | ICD-10-CM | POA: Diagnosis not present

## 2021-02-08 NOTE — Telephone Encounter (Signed)
Notified the company to cancel order and she stated that patient said she did not need anyway.

## 2021-02-08 NOTE — Patient Instructions (Signed)
Dawn Simmons , Thank you for taking time to complete your Medicare Wellness Visit. I appreciate your ongoing commitment to your health goals. Please review the following plan we discussed and let me know if I can assist you in the future.   Screening recommendations/referrals: Colonoscopy: Cologuard completed 11/2019-Due 11/2022 Mammogram: Declined Bone Density: Completed 08/24/2020-Due 08/25/2022 Recommended yearly ophthalmology/optometry visit for glaucoma screening and checkup Recommended yearly dental visit for hygiene and checkup  Vaccinations: Influenza vaccine: Up to date Pneumococcal vaccine: Due-May obtain vaccine at our office or your local pharmacy. Tdap vaccine: Discuss with pharmacy Shingles vaccine: Discuss with pharmacy   Covid-19:Booster available at the pharmacy.  Advanced directives: Information packet available at our office.  Conditions/risks identified: See problem list  Next appointment: Follow up in one year for your annual wellness visit    Preventive Care 74 Years and Older, Female Preventive care refers to lifestyle choices and visits with your health care provider that can promote health and wellness. What does preventive care include? A yearly physical exam. This is also called an annual well check. Dental exams once or twice a year. Routine eye exams. Ask your health care provider how often you should have your eyes checked. Personal lifestyle choices, including: Daily care of your teeth and gums. Regular physical activity. Eating a healthy diet. Avoiding tobacco and drug use. Limiting alcohol use. Practicing safe sex. Taking low-dose aspirin every day. Taking vitamin and mineral supplements as recommended by your health care provider. What happens during an annual well check? The services and screenings done by your health care provider during your annual well check will depend on your age, overall health, lifestyle risk factors, and family history of  disease. Counseling  Your health care provider may ask you questions about your: Alcohol use. Tobacco use. Drug use. Emotional well-being. Home and relationship well-being. Sexual activity. Eating habits. History of falls. Memory and ability to understand (cognition). Work and work Astronomer. Reproductive health. Screening  You may have the following tests or measurements: Height, weight, and BMI. Blood pressure. Lipid and cholesterol levels. These may be checked every 5 years, or more frequently if you are over 25 years old. Skin check. Lung cancer screening. You may have this screening every year starting at age 74 if you have a 30-pack-year history of smoking and currently smoke or have quit within the past 15 years. Fecal occult blood test (FOBT) of the stool. You may have this test every year starting at age 74. Flexible sigmoidoscopy or colonoscopy. You may have a sigmoidoscopy every 5 years or a colonoscopy every 10 years starting at age 74. Hepatitis C blood test. Hepatitis B blood test. Sexually transmitted disease (STD) testing. Diabetes screening. This is done by checking your blood sugar (glucose) after you have not eaten for a while (fasting). You may have this done every 1-3 years. Bone density scan. This is done to screen for osteoporosis. You may have this done starting at age 74. Mammogram. This may be done every 1-2 years. Talk to your health care provider about how often you should have regular mammograms. Talk with your health care provider about your test results, treatment options, and if necessary, the need for more tests. Vaccines  Your health care provider may recommend certain vaccines, such as: Influenza vaccine. This is recommended every year. Tetanus, diphtheria, and acellular pertussis (Tdap, Td) vaccine. You may need a Td booster every 10 years. Zoster vaccine. You may need this after age 74. Pneumococcal 13-valent conjugate (PCV13) vaccine.  One  dose is recommended after age 74. Pneumococcal polysaccharide (PPSV23) vaccine. One dose is recommended after age 74. Talk to your health care provider about which screenings and vaccines you need and how often you need them. This information is not intended to replace advice given to you by your health care provider. Make sure you discuss any questions you have with your health care provider. Document Released: 04/24/2015 Document Revised: 12/16/2015 Document Reviewed: 01/27/2015 Elsevier Interactive Patient Education  2017 [place] Prevention in the Home Falls can cause injuries. They can happen to people of all ages. There are many things you can do to make your home safe and to help prevent falls. What can I do on the outside of my home? Regularly fix the edges of walkways and driveways and fix any cracks. Remove anything that might make you trip as you walk through a door, such as a raised step or threshold. Trim any bushes or trees on the path to your home. Use bright outdoor lighting. Clear any walking paths of anything that might make someone trip, such as rocks or tools. Regularly check to see if handrails are loose or broken. Make sure that both sides of any steps have handrails. Any raised decks and porches should have guardrails on the edges. Have any leaves, snow, or ice cleared regularly. Use sand or salt on walking paths during winter. Clean up any spills in your garage right away. This includes oil or grease spills. What can I do in the bathroom? Use night lights. Install grab bars by the toilet and in the tub and shower. Do not use towel bars as grab bars. Use non-skid mats or decals in the tub or shower. If you need to sit down in the shower, use a plastic, non-slip stool. Keep the floor dry. Clean up any water that spills on the floor as soon as it happens. Remove soap buildup in the tub or shower regularly. Attach bath mats securely with double-sided  non-slip rug tape. Do not have throw rugs and other things on the floor that can make you trip. What can I do in the bedroom? Use night lights. Make sure that you have a light by your bed that is easy to reach. Do not use any sheets or blankets that are too big for your bed. They should not hang down onto the floor. Have a firm chair that has side arms. You can use this for support while you get dressed. Do not have throw rugs and other things on the floor that can make you trip. What can I do in the kitchen? Clean up any spills right away. Avoid walking on wet floors. Keep items that you use a lot in easy-to-reach places. If you need to reach something above you, use a strong step stool that has a grab bar. Keep electrical cords out of the way. Do not use floor polish or wax that makes floors slippery. If you must use wax, use non-skid floor wax. Do not have throw rugs and other things on the floor that can make you trip. What can I do with my stairs? Do not leave any items on the stairs. Make sure that there are handrails on both sides of the stairs and use them. Fix handrails that are broken or loose. Make sure that handrails are as long as the stairways. Check any carpeting to make sure that it is firmly attached to the stairs. Fix any carpet that is loose or  worn. Avoid having throw rugs at the top or bottom of the stairs. If you do have throw rugs, attach them to the floor with carpet tape. Make sure that you have a light switch at the top of the stairs and the bottom of the stairs. If you do not have them, ask someone to add them for you. What else can I do to help prevent falls? Wear shoes that: Do not have high heels. Have rubber bottoms. Are comfortable and fit you well. Are closed at the toe. Do not wear sandals. If you use a stepladder: Make sure that it is fully opened. Do not climb a closed stepladder. Make sure that both sides of the stepladder are locked into place. Ask  someone to hold it for you, if possible. Clearly mark and make sure that you can see: Any grab bars or handrails. First and last steps. Where the edge of each step is. Use tools that help you move around (mobility aids) if they are needed. These include: Canes. Walkers. Scooters. Crutches. Turn on the lights when you go into a dark area. Replace any light bulbs as soon as they burn out. Set up your furniture so you have a clear path. Avoid moving your furniture around. If any of your floors are uneven, fix them. If there are any pets around you, be aware of where they are. Review your medicines with your doctor. Some medicines can make you feel dizzy. This can increase your chance of falling. Ask your doctor what other things that you can do to help prevent falls. This information is not intended to replace advice given to you by your health care provider. Make sure you discuss any questions you have with your health care provider. Document Released: 01/22/2009 Document Revised: 09/03/2015 Document Reviewed: 05/02/2014 Elsevier Interactive Patient Education  2017 [employer].

## 2021-02-08 NOTE — Telephone Encounter (Signed)
Advised patient that we apologize for sending that back in.  She does not need this and does want it.

## 2021-02-08 NOTE — Progress Notes (Signed)
Subjective:   Dawn Simmons is a 75 y.o. female who presents for Medicare Annual (Subsequent) preventive examination.  I connected with [name] today by telephone and verified that I am speaking with the correct person using two identifiers. Location patient: home Location provider: work Persons participating in the virtual visit: patient, Engineer, civil (consulting).    I discussed the limitations, risks, security and privacy concerns of performing an evaluation and management service by telephone and the availability of in person appointments. I also discussed with the patient that there may be a patient responsible charge related to this service. The patient expressed understanding and verbally consented to this telephonic visit.    Interactive audio and video telecommunications were attempted between this provider and patient, however failed, due to patient having technical difficulties OR patient did not have access to video capability.  We continued and completed visit with audio only.  Some vital signs may be absent or patient reported.   Time Spent with patient on telephone encounter: 40 minutes   Review of Systems     Cardiac Risk Factors include: advanced age (>81men, >49 women);sedentary lifestyle     Objective:    Today's Vitals   02/08/21 1542  Weight: 123 lb (55.8 kg)  Height: 5\' 6"  (1.676 m)   Body mass index is 19.85 kg/m.  Advanced Directives 02/08/2021 12/13/2019  Does Patient Have a Medical Advance Directive? No Yes  Type of Advance Directive - Healthcare Power of [place];Living will  Does patient want to make changes to medical advance directive? - No - Patient declined  Copy of Healthcare Power of Attorney in Chart? - No - copy requested    Current Medications (verified) Outpatient Encounter Medications as of 02/08/2021  Medication Sig   Ascorbic Acid (VITAMIN C) 100 MG tablet Take 100 mg by mouth daily.   Calcium-Magnesium 200-100 MG TABS Take 4 tablets by mouth daily.  2 tablets in the morning and 2 tablets in the evening   diazepam (VALIUM) 5 MG tablet Take 1/2-1 tablet po QHS   ergocalciferol (DRISDOL) 200 MCG/ML drops Take by mouth daily. PURE brand   FLUoxetine (PROZAC) 10 MG capsule Take 2 capsules (20 mg total) by mouth every morning.   loratadine (CLARITIN) 10 MG tablet Take 10 mg by mouth daily.   MAGNESIUM PO Take 200 mg by mouth at bedtime.   Multiple Vitamin (MULTIVITAMIN) capsule Take 1 capsule by mouth daily.   nystatin (MYCOSTATIN) 100000 UNIT/ML suspension Apply to corners of mouth three x daily   Omega-3 1000 MG CAPS Take by mouth.   triamcinolone cream (KENALOG) 0.1 % Apply 1 application topically 2 (two) times daily.   TURMERIC PO Take by mouth.   No facility-administered encounter medications on file as of 02/08/2021.    Allergies (verified) Doxycycline, Tetanus toxoids, and Flexeril [cyclobenzaprine]   History: Past Medical History:  Diagnosis Date   H/O fibromyalgia    H/O insomnia    H/O measles    H/O rubella    H/O syncope    History of chicken pox 09/15/2019   History of palpitations    Hx of mumps    Mitral valve prolapse    Palpitations    Syncope    Von Willebrand disease    Past Surgical History:  Procedure Laterality Date   NO PAST SURGERIES     Family History  Problem Relation Age of Onset   Stroke Mother    Emphysema Father        smoked  Alcohol abuse Father    Heart attack Maternal Grandfather    Heart attack Paternal Grandfather    Heart attack Maternal Uncle    Heart failure Maternal Grandmother    Heart failure Paternal Grandmother    OCD Son    Drug abuse Son    Seizures Neg Hx    Social History   Socioeconomic History   Marital status: Married    Spouse name: tony   Number of children: 4   Years of education: 12   Highest education level: Not on file  Occupational History   Not on file  Tobacco Use   Smoking status: Former    Packs/day: 0.50    Years: 10.00    Pack years: 5.00     Types: Cigarettes    Quit date: 04/27/1972    Years since quitting: 48.8   Smokeless tobacco: Never  Vaping Use   Vaping Use: Never used  Substance and Sexual Activity   Alcohol use: Yes    Alcohol/week: 0.0 standard drinks    Comment: occ 1 glass wine or beer   Drug use: No   Sexual activity: Not on file  Other Topics Concern   Not on file  Social History Narrative   Lives at home with husband   Caffeine use- tea, 1 cup/day   Social Determinants of Health   Financial Resource Strain: Not on file  Food Insecurity: Not on file  Transportation Needs: No Transportation Needs   Lack of Transportation (Medical): No   Lack of Transportation (Non-Medical): No  Physical Activity: Inactive   Days of Exercise per Week: 0 days   Minutes of Exercise per Session: 0 min  Stress: Not on file  Social Connections: Not on file    Tobacco Counseling Counseling given: Not Answered   Clinical Intake:  Pre-visit preparation completed: Yes  Pain : No/denies pain     BMI - recorded: 19.85 Nutritional Status: BMI <19  Underweight Nutritional Risks: None Diabetes: No  How often do you need to have someone help you when you read instructions, pamphlets, or other written materials from your doctor or pharmacy?: 1 - Never  Diabetic?No  Interpreter Needed?: No  Information entered by :: Thomasenia Sales LPN   Activities of Daily Living In your present state of health, do you have any difficulty performing the following activities: 02/08/2021 01/25/2021  Hearing? N N  Vision? N N  Difficulty concentrating or making decisions? N N  Comment has had some issues in the past but it is better now -  Walking or climbing stairs? N N  Dressing or bathing? N N  Doing errands, shopping? Y N  Preparing Food and eating ? N -  Using the Toilet? N -  In the past six months, have you accidently leaked urine? N -  Do you have problems with loss of bowel control? N -  Managing your  Medications? N -  Managing your Finances? N -  Housekeeping or managing your Housekeeping? N -  Some recent data might be hidden    Patient Care Team: Bradd Canary, MD as PCP - General (Family Medicine) Wende Crease, PT as Physical Therapist (Physical Therapy)  Indicate any recent Medical Services you may have received from other than [facility] providers in the past year (date may be approximate).     Assessment:   This is a routine wellness examination for [name].  Hearing/Vision screen Hearing Screening - Comments:: No issues Vision Screening - Comments:: Reading  glasses Last eye exam-10/2020-Dr. Dione Booze  Dietary issues and exercise activities discussed: Current Exercise Habits: The patient does not participate in regular exercise at present, Exercise limited by: None identified   Goals Addressed             This Visit's Progress    Patient Stated       I would like to "do" more like working outside.       Depression Screen PHQ 2/9 Scores 02/08/2021 01/25/2021 12/13/2019  PHQ - 2 Score 1 2 0  PHQ- 9 Score - 7 -    Fall Risk Fall Risk  02/08/2021 01/25/2021 12/13/2019 06/01/2015  Falls in the past year? 0 0 0 Yes  Number falls in past yr: 0 0 0 2 or more  Injury with Fall? 0 0 0 No  Risk for fall due to : - - - Other (Comment)  Risk for fall due to: Comment - - - due to passing out  Follow up Falls prevention discussed - Education provided;Falls prevention discussed -    FALL RISK PREVENTION PERTAINING TO THE HOME:  Any stairs in or around the home? Yes  If so, are there any without handrails? No  Home free of loose throw rugs in walkways, pet beds, electrical cords, etc? Yes  Adequate lighting in your home to reduce risk of falls? Yes   ASSISTIVE DEVICES UTILIZED TO PREVENT FALLS:  Life alert? No  Use of a cane, walker or w/c? No  Grab bars in the bathroom? No  Shower chair or bench in shower? No  Elevated toilet seat or a handicapped toilet? No    TIMED UP AND GO:  Was the test performed? No . Phone visit   Cognitive Function:Patient states she has memory issues from PTSD. She is currently being treated by Corie Chiquito. MMSE - Mini Mental State Exam 12/13/2019  Not completed: (No Data)        Immunizations Immunization History  Administered Date(s) Administered   Fluad Quad(high Dose 65+) 01/25/2021   Influenza,inj,Quad PF,6+ Mos 02/04/2020   PFIZER(Purple Top)SARS-COV-2 Vaccination 09/19/2019, 10/11/2019    TDAP status: Due, Education has been provided regarding the importance of this vaccine. Advised may receive this vaccine at local pharmacy or Health Dept. Aware to provide a copy of the vaccination record if obtained from local pharmacy or Health Dept. Verbalized acceptance and understanding.  Flu Vaccine status: Up to date  Pneumococcal vaccine status: Due, Education has been provided regarding the importance of this vaccine. Advised may receive this vaccine at local pharmacy or Health Dept. Aware to provide a copy of the vaccination record if obtained from local pharmacy or Health Dept. Verbalized acceptance and understanding.  Covid-19 vaccine status: Information provided on how to obtain vaccines.   Qualifies for Shingles Vaccine? Yes   Zostavax completed No   Shingrix Completed?: No.    Education has been provided regarding the importance of this vaccine. Patient has been advised to call insurance company to determine out of pocket expense if they have not yet received this vaccine. Advised may also receive vaccine at local pharmacy or Health Dept. Verbalized acceptance and understanding.  Screening Tests Health Maintenance  Topic Date Due   Pneumonia Vaccine 55+ Years old (1 - PCV) Never done   COLONOSCOPY (Pts 45-109yrs Insurance coverage will need to be confirmed)  Never done   MAMMOGRAM  Never done   COVID-19 Vaccine (3 - Booster for Pfizer series) 12/06/2019   Zoster Vaccines- Shingrix (1 of 2)  02/24/2022 (Originally 12/04/1996)   INFLUENZA VACCINE  Completed   DEXA SCAN  Completed   HPV VACCINES  Aged Out   TETANUS/TDAP  Discontinued   Hepatitis C Screening  Discontinued    Health Maintenance  Health Maintenance Due  Topic Date Due   Pneumonia Vaccine 44+ Years old (1 - PCV) Never done   COLONOSCOPY (Pts 45-17yrs Insurance coverage will need to be confirmed)  Never done   MAMMOGRAM  Never done   COVID-19 Vaccine (3 - Booster for Pfizer series) 12/06/2019    Colorectal cancer screening: Type of screening: Cologuard. Completed 11/10/2019. Repeat every 3 years  Mammogram status: Declined  Bone Density status: Completed 08/24/2020. Results reflect: Bone density results: OSTEOPOROSIS. Repeat every 2 years.  Lung Cancer Screening: (Low Dose CT Chest recommended if Age 24-80 years, 30 pack-year currently smoking OR have quit w/in 15years.) does not qualify.     Additional Screening:  Hepatitis C Screening: does qualify; Declined  Vision Screening: Recommended annual ophthalmology exams for early detection of glaucoma and other disorders of the eye. Is the patient up to date with their annual eye exam?  Yes  Who is the provider or what is the name of the office in which the patient attends annual eye exams? Dr. Dione Booze   Dental Screening: Recommended annual dental exams for proper oral hygiene  Community Resource Referral / Chronic Care Management: CRR required this visit?  No   CCM required this visit?  No      Plan:     I have personally reviewed and noted the following in the patient's chart:   Medical and social history Use of alcohol, tobacco or illicit drugs  Current medications and supplements including opioid prescriptions.  Functional ability and status Nutritional status Physical activity Advanced directives List of other physicians Hospitalizations, surgeries, and ER visits in previous 12 months Vitals Screenings to include cognitive, depression,  and falls Referrals and appointments  In addition, I have reviewed and discussed with patient certain preventive protocols, quality metrics, and best practice recommendations. A written personalized care plan for preventive services as well as general preventive health recommendations were provided to patient.   Due to this being a telephonic visit, the after visit summary with patients personalized plan was offered to patient via mail or my-chart.  Patient to access on my-chart.   Roanna Raider, LPN   [id number]  Nurse Health Advisor  Nurse Notes: None

## 2021-02-11 ENCOUNTER — Ambulatory Visit (INDEPENDENT_AMBULATORY_CARE_PROVIDER_SITE_OTHER): Payer: Medicare Other | Admitting: Psychology

## 2021-02-11 DIAGNOSIS — F4310 Post-traumatic stress disorder, unspecified: Secondary | ICD-10-CM | POA: Diagnosis not present

## 2021-02-18 ENCOUNTER — Other Ambulatory Visit: Payer: Self-pay

## 2021-02-18 ENCOUNTER — Ambulatory Visit (INDEPENDENT_AMBULATORY_CARE_PROVIDER_SITE_OTHER): Payer: Medicare Other | Admitting: Psychology

## 2021-02-18 DIAGNOSIS — F4310 Post-traumatic stress disorder, unspecified: Secondary | ICD-10-CM | POA: Diagnosis not present

## 2021-02-25 ENCOUNTER — Other Ambulatory Visit: Payer: Self-pay

## 2021-02-25 ENCOUNTER — Ambulatory Visit (INDEPENDENT_AMBULATORY_CARE_PROVIDER_SITE_OTHER): Payer: Medicare Other | Admitting: Psychiatry

## 2021-02-25 ENCOUNTER — Ambulatory Visit (INDEPENDENT_AMBULATORY_CARE_PROVIDER_SITE_OTHER): Payer: Medicare Other | Admitting: Psychology

## 2021-02-25 ENCOUNTER — Encounter: Payer: Self-pay | Admitting: Psychiatry

## 2021-02-25 DIAGNOSIS — G4700 Insomnia, unspecified: Secondary | ICD-10-CM | POA: Diagnosis not present

## 2021-02-25 DIAGNOSIS — Z20822 Contact with and (suspected) exposure to covid-19: Secondary | ICD-10-CM | POA: Diagnosis not present

## 2021-02-25 DIAGNOSIS — F4310 Post-traumatic stress disorder, unspecified: Secondary | ICD-10-CM

## 2021-02-25 MED ORDER — DIAZEPAM 5 MG PO TABS: 5.0000 mg | ORAL_TABLET | Freq: Every day | ORAL | 0 refills | Status: DC

## 2021-02-25 MED ORDER — FLUOXETINE HCL 10 MG PO CAPS: 20.0000 mg | ORAL_CAPSULE | Freq: Every morning | ORAL | 1 refills | Status: DC

## 2021-02-25 NOTE — Progress Notes (Signed)
Dawn Simmons [id number] 08-Jan-1946 75 y.o.  Subjective:   Patient ID:  Dawn Simmons is a 75 y.o. (DOB 1946/04/07) female.  Chief Complaint:  Chief Complaint  Patient presents with   Anxiety   Sleeping Problem    Anxiety    Dawn Simmons presents to the office today for follow-up of anxiety and sleep disturbance. She reports that she is sleeping well with medication. She reports that she has long, vivid nightmares. She reports experiencing some anxiety. She reports that she prefers warm weather and being able to work in her garden. Reports stress after contractors "ruined" their house. She reports some sadness in response to "being sick this long." She reports that "it is hard for me" to take medication after years of never taking medication. She feels that medication has lowered her motivation. Energy is lower. She reports that some days she stays in her pajamas. Sleeping 10-12 hours a night. Denies intrusive memories. She reports adequate concentration and focus.   She reports that most of her memory has not returned. She reports that she is able to drive her car when husband is with her. She cannot recall places and directions as she once did. "I miss who I was." She reports that she cannot recall how to cook. "I think I am kind of depressed." She reports decreased enthusiasm for things. Denies SI.   Continues to see Bambi Cottle, LCSW.  Reports that her cat no longer wants companionship and tends to roam. Some stress in response to children's stressors. Reports that a few weeks ago she had to call 911 for husband who was in the hospital.    Past Psychiatric Medication Trials: (She reports that she is very sensitive to medication) Diazepam- Prescribed as need with limited improvement.  Prozac- reports that she has to take Prozac 20 mg in divided doses.   PHQ2-9    Flowsheet Row Clinical Support from 02/08/2021 in [facility] at [facility] Office Visit  from 01/25/2021 in [facility] Clinical Support from 12/13/2019 in [facility]  PHQ-2 Total Score 1 2 0  PHQ-9 Total Score -- 7 --        Review of Systems:  Review of Systems  Constitutional:  Positive for fatigue.  Musculoskeletal:  Negative for gait problem.  Psychiatric/Behavioral:         Please refer to HPI   Medications: I have reviewed the patient's current medications.  Current Outpatient Medications  Medication Sig Dispense Refill   Ascorbic Acid (VITAMIN C) 100 MG tablet Take 100 mg by mouth daily.     Calcium-Magnesium 200-100 MG TABS Take 4 tablets by mouth daily. 2 tablets in the morning and 2 tablets in the evening     diazepam (VALIUM) 5 MG tablet Take 1 tablet (5 mg total) by mouth at bedtime. Take 1/2-1 tablet po QHS 90 tablet 0   ergocalciferol (DRISDOL) 200 MCG/ML drops Take by mouth daily. PURE brand     FLUoxetine (PROZAC) 10 MG capsule Take 2 capsules (20 mg total) by mouth every morning. 180 capsule 1   loratadine (CLARITIN) 10 MG tablet Take 10 mg by mouth daily.     MAGNESIUM PO Take 200 mg by mouth at bedtime.     Multiple Vitamin (MULTIVITAMIN) capsule Take 1 capsule by mouth daily.     nystatin (MYCOSTATIN) 100000 UNIT/ML suspension Apply to corners of mouth three x daily 60 mL 1  Omega-3 1000 MG CAPS Take by mouth.     triamcinolone cream (KENALOG) 0.1 % Apply 1 application topically 2 (two) times daily. 80 g 1   TURMERIC PO Take by mouth.     No current facility-administered medications for this visit.    Medication Side Effects: Other: Possible affective dulling  Allergies:  Allergies  Allergen Reactions   Doxycycline Swelling   Tetanus Toxoids Other (See Comments)    PAIN IN NECK AND FACE   Flexeril [Cyclobenzaprine] Other (See Comments)    Past Medical History:  Diagnosis Date   H/O fibromyalgia    H/O insomnia    H/O measles    H/O rubella    H/O  syncope    History of chicken pox 09/15/2019   History of palpitations    Hx of mumps    Mitral valve prolapse    Palpitations    Syncope    Von Willebrand disease     Past Medical History, Surgical history, Social history, and Family history were reviewed and updated as appropriate.   Please see review of systems for further details on the patient's review from today.   Objective:   Physical Exam:  There were no vitals taken for this visit.  Physical Exam Constitutional:      General: She is not in acute distress. Musculoskeletal:        General: No deformity.  Neurological:     Mental Status: She is alert and oriented to person, place, and time.     Coordination: Coordination normal.  Psychiatric:        Attention and Perception: Attention and perception normal. She does not perceive auditory or visual hallucinations.        Mood and Affect: Mood is anxious. Mood is not depressed. Affect is not labile, blunt, angry or inappropriate.        Speech: Speech normal.        Behavior: Behavior normal.        Thought Content: Thought content normal. Thought content is not paranoid or delusional. Thought content does not include homicidal or suicidal ideation. Thought content does not include homicidal or suicidal plan.        Cognition and Memory: Memory is impaired. She exhibits impaired recent memory.        Judgment: Judgment normal.     Comments: Insight intact    Lab Review:     Component Value Date/Time   NA 140 01/25/2021 1504   K 4.0 01/25/2021 1504   CL 106 01/25/2021 1504   CO2 25 01/25/2021 1504   GLUCOSE 88 01/25/2021 1504   BUN 17 01/25/2021 1504   CREATININE 0.88 01/25/2021 1504   CREATININE 0.97 (H) 01/10/2020 1358   CALCIUM 10.0 01/25/2021 1504   PROT 6.9 01/25/2021 1504   ALBUMIN 4.6 01/25/2021 1504   AST 23 01/25/2021 1504   ALT 15 01/25/2021 1504   ALKPHOS 67 01/25/2021 1504   BILITOT 0.6 01/25/2021 1504       Component Value Date/Time   WBC 4.0  01/25/2021 1504   RBC 4.08 01/25/2021 1504   HGB 12.9 01/25/2021 1504   HCT 38.1 01/25/2021 1504   PLT 272.0 01/25/2021 1504   MCV 93.4 01/25/2021 1504   MCH 31.3 01/10/2020 1358   MCHC 33.9 01/25/2021 1504   RDW 13.1 01/25/2021 1504   LYMPHSABS 1,197 01/10/2020 1358   MONOABS 0.5 10/31/2019 1509   EOSABS 198 01/10/2020 1358   BASOSABS 32 01/10/2020 1358  No results found for: POCLITH, LITHIUM   No results found for: PHENYTOIN, PHENOBARB, VALPROATE, CBMZ   .res Assessment: Plan:    Pt seen for 30 minutes and time spent discussing long term treatment plan. She reports that her long-term goal is to reduce medication if possible. She reports that she would like to continue medication without changes since she continues to have significant stressors and anxiety and sadness in response to stressors.  Continue Valium 5 mg 1/2-1 tab po QHS for insomnia.  Continue Prozac 20 mg po qd for mood and anxiety.  Recommend continuing therapy from [facility], LCSW.  Pt to follow-up in 3 months or sooner if clinically indicated.  Patient advised to contact office with any questions, adverse effects, or acute worsening in signs and symptoms.  Dawn Simmons was seen today for anxiety and sleeping problem.  Diagnoses and all orders for this visit:  PTSD (post-traumatic stress disorder) -     diazepam (VALIUM) 5 MG tablet; Take 1 tablet (5 mg total) by mouth at bedtime. Take 1/2-1 tablet po QHS -     FLUoxetine (PROZAC) 10 MG capsule; Take 2 capsules (20 mg total) by mouth every morning.  Insomnia, unspecified type -     diazepam (VALIUM) 5 MG tablet; Take 1 tablet (5 mg total) by mouth at bedtime. Take 1/2-1 tablet po QHS    Please see After Visit Summary for patient specific instructions.  Future Appointments  Date Time Provider Department Center  03/11/2021  1:00 PM Cottle, Lynnell Dike, LCSW [id number] None  03/29/2021  3:20 PM Bradd Canary, MD [facility]  05/28/2021 11:00 AM Corie Chiquito,  PMHNP [facility] None    No orders of the defined types were placed in this encounter.   -------------------------------

## 2021-03-08 ENCOUNTER — Telehealth: Payer: Self-pay | Admitting: Psychiatry

## 2021-03-08 NOTE — Telephone Encounter (Signed)
Please let her know that she can try reducing it to 1/2 tablet.

## 2021-03-08 NOTE — Telephone Encounter (Signed)
Pt called and is falling and tripping over stuff. Sleep 12 hours. She wants to come down on the medicine. She is tired all the time. She is unsteady. Please give her a call at [phone]

## 2021-03-08 NOTE — Telephone Encounter (Signed)
Pt informed

## 2021-03-08 NOTE — Telephone Encounter (Signed)
Pt takes valium 5 mg HS.I asked if she takes 1/2 tab and she said no she takes a whole tab.She was not sure if cutting in half was the best option,but she is sleeping 12 hours and has no energy the next day.Please advise

## 2021-03-11 ENCOUNTER — Ambulatory Visit: Payer: Medicare Other | Admitting: Family Medicine

## 2021-03-11 ENCOUNTER — Other Ambulatory Visit: Payer: Self-pay

## 2021-03-11 ENCOUNTER — Ambulatory Visit (INDEPENDENT_AMBULATORY_CARE_PROVIDER_SITE_OTHER): Payer: Medicare Other | Admitting: Psychology

## 2021-03-11 DIAGNOSIS — F4310 Post-traumatic stress disorder, unspecified: Secondary | ICD-10-CM

## 2021-03-16 DIAGNOSIS — Z20822 Contact with and (suspected) exposure to covid-19: Secondary | ICD-10-CM | POA: Diagnosis not present

## 2021-03-18 ENCOUNTER — Ambulatory Visit (INDEPENDENT_AMBULATORY_CARE_PROVIDER_SITE_OTHER): Payer: Medicare Other | Admitting: Psychology

## 2021-03-18 DIAGNOSIS — F4310 Post-traumatic stress disorder, unspecified: Secondary | ICD-10-CM

## 2021-03-18 NOTE — Progress Notes (Signed)
[facility] Counselor/Therapist Progress Note  Patient ID: Dawn Simmons, MRN: [id number],    Date: 03/18/2021  Time Spent: 50 minutes  Treatment Type: Individual Therapy  Reported Symptoms: problems with feeling depressed and anxious.  Issues with memory at times.  Mental Status Exam: Appearance:  Casual     Behavior: Appropriate  Motor: Normal  Speech/Language:  Normal Rate  Affect: Appropriate  Mood: depressed  Thought process: normal  Thought content:   WNL  Sensory/Perceptual disturbances:   WNL  Orientation: oriented to person, place, time/date, and situation  Attention: Fair  Concentration: Fair  Memory: Recent;   Fair  Progress Energy of knowledge:  Good  Insight:   Fair  Judgment:  Fair  Impulse Control: Good   Risk Assessment: Danger to Self:  No Self-injurious Behavior: No Danger to Others: No Duty to Warn:no Physical Aggression / Violence:No  Access to Firearms a concern: No  Gang Involvement:No   Subjective: The patient came in for a face-to-face individual therapy session in the office today. The patient presents as pleasant and cooperative. The patient is reverting back to her black and white thinking and catastrophizing.   I really explained how she hast to catch herself when she is doing this. The patient wants me to provide her with treatments to make her thoughts change. I explain to her that she is going to have to do her own work around her own thinking because EMDR would not work for just any kind of thought. We also identified that she needs to develop her support system and social [place] more. We did talk about doing EMDR around Covid because she is afraid to interact with people because of Covid.   Interventions: Cognitive Behavioral Therapy  Diagnosis:PTSD (post-traumatic stress disorder)  Plan: Plan: Patient is to use CBT, mindfulness and coping skills to help manage decrease symptoms associated with her PTSD. Patient to utilize his  support system during this time.   Long-term goal:   Reduce overall level, frequency, and intensity of the feelings of depression and anxiety 7/10 to a 0-2/10 in severity for at least 3 consecutive months per patient report.  Short-term goal:  Decrease "deflating, self-doubting" and catastrophizing thinking style Decrease anxiety producing self talk such as thinking of the worse possible life outcome Utilize coping skills as discussed in session   Progress:  progressing     Sabrea Sankey G Williette Loewe, LCSW

## 2021-03-25 ENCOUNTER — Ambulatory Visit (INDEPENDENT_AMBULATORY_CARE_PROVIDER_SITE_OTHER): Payer: Medicare Other | Admitting: Psychology

## 2021-03-25 ENCOUNTER — Other Ambulatory Visit: Payer: Self-pay

## 2021-03-25 DIAGNOSIS — F4310 Post-traumatic stress disorder, unspecified: Secondary | ICD-10-CM

## 2021-03-25 NOTE — Progress Notes (Signed)
[facility] Counselor/Therapist Progress Note  Patient ID: Dawn Simmons, MRN: [id number],    Date: 03/25/2021  Time Spent: 60 minutes  Treatment Type: Individual Therapy  Reported Symptoms: anxiety and depression and memory loss  Mental Status Exam: Appearance:  Casual     Behavior: Appropriate  Motor: Normal  Speech/Language:  Normal Rate  Affect: Blunt  Mood: anxious  Thought process: normal  Thought content:   WNL  Sensory/Perceptual disturbances:   WNL  Orientation: oriented to person, place, time/date, and situation  Attention: Good  Concentration: Good  Memory: WNL  Fund of knowledge:  Good  Insight:   Good  Judgment:  Good  Impulse Control: Good   Risk Assessment: Danger to Self:  No Self-injurious Behavior: No Danger to Others: No Duty to Warn:no Physical Aggression / Violence:No  Access to Firearms a concern: No  Gang Involvement:No   Subjective: The patient attended the face-to-face individual therapy session in the office today.  The patient presents with a blunted affect and mood is somewhat anxious.  The patient reports that things are going okay at home and that her husband is potentially going to have to have another surgery for his colostomy.  The patient talked today about being negative and we decided to do EMDR around the negative cognition of I cannot.  The positive cognition that we put and was I can be positive or neutral.  The patient felt that the session was beneficial.  Interventions: Cognitive Behavioral Therapy and Eye Movement Desensitization and Reprocessing (EMDR)  Diagnosis:PTSD (post-traumatic stress disorder)  Plan: Treatment Plan  Strengths/Abilities:Insightful, motivated, supportive husband  Treatment Preferences: Outpatient Individual therapy  Statement of Needs: "I have a problem passing out and I was told I have PTSD"    Symptoms:  Demonstrates an exaggerated startle response.:(Status: improved). Depressed   or irritable mood.: (Status: improved). Describes a reliving of the event,  particularly through dissociative flashbacks.:  (Status: improved). Displays a  significant decline in interest and engagement in activities.: (Status: improved). Displays significant psychological and/or physiological distress resulting from internal and external  clues that are reminiscent of the traumatic event.: (Status: improved).  Experiences disturbances in sleep.: (Status: improved). Experiences disturbing  and persistent thoughts, images, and/or perceptions of the traumatic event.: (Status: improved). Experiences frequent nightmares.: (Status: improved).  Feelings of hopelessness, worthlessness, or inappropriate guilt.: (Status:  improved). Has been exposed to a traumatic event involving actual or perceived threat of death or  serious injury.: (Status: maintained). Impairment in social, occupational, or  other areas of functioning.: (Status: improved). Intentionally avoids activities,  places, people, or objects (e.g., up-armored vehicles) that evoke memories of the event.:(Status: maintained). Intentionally avoids thoughts, feelings, or discussions related  to the traumatic event.: (Status: improved). Reports difficulty concentrating as  well as feelings of guilt.:  (Status: improved). Reports response of intense fear,  helplessness, or horror to the traumatic event.:  (Status: improved).  Problems Addressed: PTSD   Goals:  LTG:  1. Develop healthy thinking patterns and beliefs about self, others, and the world that lead to the alleviation and help prevent the relapse of  depression. Objective Identify and replace thoughts and beliefs that support depression. Target Date: 2021-05-01 Frequency: Weekly Progress: 80 Modality: individual 2. Eliminate or reduce the negative impact trauma related symptoms have  on social, occupational, and family functioning. Objective Learn and implement personal skills  to manage challenging situations related to trauma. Target Date: 2021-05-01 Frequency: Weekly Progress: 80 Modality: individual3. No longer avoids persons, places, activities,  and objects that are  reminiscent of the traumatic event. Objective 3.  Participate in Eye Movement Desensitization and Reprocessing (EMDR) to reduce emotional distress  related to traumatic thoughts, feelings, and images. Target Date: 2021-05-01 Frequency: Weekly Progress: 80 Modality: individual 4.  Learn and implement guided self-dialogue to manage thoughts, feelings, and urges brought on by  encounters with trauma-related situations. Target Date: 2021-05-01 Frequency: Weekly Progress: 90 Modality: individual 5.  No longer experiences intrusive event recollections, avoidance of event  reminders, intense arousal, or disinterest in activities or  relationships. Target Date:05/01/2021  Frequency : Weekly Progress 80    Modality: individual 6.Thinks about or openly discusses the traumatic event with others  without experiencing psychological or physiological distress. Target Date : 05/01/2021  Frequency: Weekly Progress:80   Modality: Individual Interventions by Therapist:  CBT, problem solving therapy, EMDR, insight oriented  Montrail Mehrer G Kermit Arnette, LCSW

## 2021-03-29 ENCOUNTER — Ambulatory Visit (INDEPENDENT_AMBULATORY_CARE_PROVIDER_SITE_OTHER): Payer: Medicare Other | Admitting: Family Medicine

## 2021-03-29 ENCOUNTER — Encounter: Payer: Self-pay | Admitting: Family Medicine

## 2021-03-29 VITALS — BP 112/60 | HR 58 | Temp 97.5°F | Resp 16 | Ht 66.0 in | Wt 123.8 lb

## 2021-03-29 DIAGNOSIS — R058 Other specified cough: Secondary | ICD-10-CM

## 2021-03-29 DIAGNOSIS — U099 Post covid-19 condition, unspecified: Secondary | ICD-10-CM

## 2021-03-29 DIAGNOSIS — Z23 Encounter for immunization: Secondary | ICD-10-CM | POA: Diagnosis not present

## 2021-03-29 DIAGNOSIS — G4700 Insomnia, unspecified: Secondary | ICD-10-CM | POA: Diagnosis not present

## 2021-03-29 DIAGNOSIS — F4310 Post-traumatic stress disorder, unspecified: Secondary | ICD-10-CM
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
## 2021-03-29 NOTE — Patient Instructions (Signed)
Shingrix is the new shingles shot, 2 shots over 2-6 months, confirm coverage with insurance and document, then can return here for shots with nurse appt or at pharmacy   Prevnar 20 once

## 2021-03-29 NOTE — Assessment & Plan Note (Signed)
Has cut her Diazepam to 1/2 tab (2.5 mg) at bedtime and is still sleeping adequately well at this time

## 2021-03-29 NOTE — Assessment & Plan Note (Signed)
Encouraged to try deep breathing exercises and continue to try and increase activity levels slowly over time.

## 2021-03-29 NOTE — Assessment & Plan Note (Signed)
No complaints today.

## 2021-04-01 ENCOUNTER — Ambulatory Visit (INDEPENDENT_AMBULATORY_CARE_PROVIDER_SITE_OTHER): Payer: Medicare Other | Admitting: Psychology

## 2021-04-01 DIAGNOSIS — F4310 Post-traumatic stress disorder, unspecified: Secondary | ICD-10-CM | POA: Diagnosis not present

## 2021-04-01 NOTE — Progress Notes (Signed)
[facility] Counselor/Therapist Progress Note  Patient ID: Dawn Simmons, MRN: [id number],    Date: 04/01/2021  Time Spent: 60 minutes  Treatment Type: Individual Therapy  Reported Symptoms: anxiety and depression and memory loss  Mental Status Exam: Appearance:  Casual     Behavior: Appropriate  Motor: Normal  Speech/Language:  Normal Rate  Affect: Blunt  Mood: anxious  Thought process: normal  Thought content:   WNL  Sensory/Perceptual disturbances:   WNL  Orientation: oriented to person, place, time/date, and situation  Attention: Good  Concentration: Good  Memory: WNL  Fund of knowledge:  Good  Insight:   Good  Judgment:  Good  Impulse Control: Good   Risk Assessment: Danger to Self:  No Self-injurious Behavior: No Danger to Others: No Duty to Warn:no Physical Aggression / Violence:No  Access to Firearms a concern: No  Gang Involvement:No   Subjective: The patient attended a face-to-face individual therapy session in the office today.  The patient reports that she is still a little bit anxious today.  She says that she is concerned about her husband and his potential surgery.  The patient reports that she did have some progress after doing the EMDR during the last session.  The patient seemed to be more positive today.  We talked about her continuing to work towards changing her negative thoughts into something more positive.  We also talked about her staying in the now.  The patient is making progress with therapy.  Interventions: Cognitive Behavioral Therapy and Eye Movement Desensitization and Reprocessing (EMDR)  Diagnosis:PTSD (post-traumatic stress disorder)  Plan: Treatment Plan  Strengths/Abilities:Insightful, motivated, supportive husband  Treatment Preferences: Outpatient Individual therapy  Statement of Needs: "I have a problem passing out and I was told I have PTSD"    Symptoms:  Demonstrates an exaggerated startle  response.:(Status: improved). Depressed  or irritable mood.: (Status: improved). Describes a reliving of the event,  particularly through dissociative flashbacks.:  (Status: improved). Displays a  significant decline in interest and engagement in activities.: (Status: improved). Displays significant psychological and/or physiological distress resulting from internal and external  clues that are reminiscent of the traumatic event.: (Status: improved).  Experiences disturbances in sleep.: (Status: improved). Experiences disturbing  and persistent thoughts, images, and/or perceptions of the traumatic event.: (Status: improved). Experiences frequent nightmares.: (Status: improved).  Feelings of hopelessness, worthlessness, or inappropriate guilt.: (Status:  improved). Has been exposed to a traumatic event involving actual or perceived threat of death or  serious injury.: (Status: maintained). Impairment in social, occupational, or  other areas of functioning.: (Status: improved). Intentionally avoids activities,  places, people, or objects (e.g., up-armored vehicles) that evoke memories of the event.:(Status: maintained). Intentionally avoids thoughts, feelings, or discussions related  to the traumatic event.: (Status: improved). Reports difficulty concentrating as  well as feelings of guilt.:  (Status: improved). Reports response of intense fear,  helplessness, or horror to the traumatic event.:  (Status: improved).  Problems Addressed: PTSD   Goals:  LTG:  1. Develop healthy thinking patterns and beliefs about self, others, and the world that lead to the alleviation and help prevent the relapse of  depression.  30% Objective Identify and replace thoughts and beliefs that support depression.  30 % Target Date: 2021-05-01 Frequency: Weekly Progress: 30 Modality: individual 2. Eliminate or reduce the negative impact trauma related symptoms have  on social, occupational, and family  functioning.  50% Objective Learn and implement personal skills to manage challenging situations related to trauma. Target Date: 2021-05-01 Frequency:  Weekly Progress: 30 Modality: individual3. No longer avoids persons, places, activities, and objects that are  reminiscent of the traumatic event. Objective 3.  Participate in Eye Movement Desensitization and Reprocessing (EMDR) to reduce emotional distress  related to traumatic thoughts, feelings, and images. Target Date: 2021-05-01 Frequency: Weekly Progress: 30 Modality: individual 4.  Learn and implement guided self-dialogue to manage thoughts, feelings, and urges brought on by  encounters with trauma-related situations. Target Date: 2021-05-01 Frequency: Weekly Progress: 30 Modality: individual 5.  No longer experiences intrusive event recollections, avoidance of event  reminders, intense arousal, or disinterest in activities or  relationships. Target Date:05/01/2021  Frequency : Weekly Progress 30    Modality: individual 6.Thinks about or openly discusses the traumatic event with others  without experiencing psychological or physiological distress. Target Date : 05/01/2021  Frequency: Weekly Progress:30   Modality: Individual Interventions by Therapist:  CBT, problem solving therapy, EMDR, insight oriented.  Patient approved Treatment plan.  Tc Kapusta G Travus Oren, LCSW

## 2021-04-08 ENCOUNTER — Ambulatory Visit (INDEPENDENT_AMBULATORY_CARE_PROVIDER_SITE_OTHER): Payer: Medicare Other | Admitting: Psychology

## 2021-04-08 ENCOUNTER — Other Ambulatory Visit: Payer: Self-pay

## 2021-04-08 DIAGNOSIS — F4310 Post-traumatic stress disorder, unspecified: Secondary | ICD-10-CM | POA: Diagnosis not present
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
## 2021-04-15 ENCOUNTER — Other Ambulatory Visit: Payer: Self-pay

## 2021-04-15 ENCOUNTER — Ambulatory Visit (INDEPENDENT_AMBULATORY_CARE_PROVIDER_SITE_OTHER): Payer: Medicare Other | Admitting: Psychology

## 2021-04-15 DIAGNOSIS — F4310 Post-traumatic stress disorder, unspecified: Secondary | ICD-10-CM | POA: Diagnosis not present

## 2021-04-15 NOTE — Progress Notes (Signed)
[facility] Counselor/Therapist Progress Note  Patient ID: Azana Kiesler, MRN: [id number],    Date: 04/15/2021  Time Spent: 60 minutes  Treatment Type: Individual Therapy  Reported Symptoms: anxiety and depression and memory loss  Mental Status Exam: Appearance:  Casual     Behavior: Appropriate  Motor: Normal  Speech/Language:  Normal Rate  Affect: Blunt  Mood: anxious  Thought process: normal  Thought content:   WNL  Sensory/Perceptual disturbances:   WNL  Orientation: oriented to person, place, time/date, and situation  Attention: Good  Concentration: Good  Memory: WNL  Fund of knowledge:  Good  Insight:   Good  Judgment:  Good  Impulse Control: Good   Risk Assessment: Danger to Self:  No Self-injurious Behavior: No Danger to Others: No Duty to Warn:no Physical Aggression / Violence:No  Access to Firearms a concern: No  Gang Involvement:No   Subjective: The patient attended a face-to-face individual therapy session in the office today.  The patient was pleasant and cooperative.  The patient states that she is having a pretty good day.  The patient was much more positive during the session and did not go into a negative spiral.  The patient talked about not liking winter however it has not derailed her.  The patient was able to focus more on the things that are going okay and she feels like she is making progress in gaining gaining some of the memories that she had before.  The patient is making progress.  Interventions: Cognitive Behavioral Therapy and Eye Movement Desensitization and Reprocessing (EMDR)  Diagnosis:PTSD (post-traumatic stress disorder)  Plan: Treatment Plan  Strengths/Abilities:Insightful, motivated, supportive husband  Treatment Preferences: Outpatient Individual therapy  Statement of Needs: "I have a problem passing out and I was told I have PTSD"    Symptoms:  Demonstrates an exaggerated startle response.:(Status: improved).  Depressed  or irritable mood.: (Status: improved). Describes a reliving of the event,  particularly through dissociative flashbacks.:  (Status: improved). Displays a  significant decline in interest and engagement in activities.: (Status: improved). Displays significant psychological and/or physiological distress resulting from internal and external  clues that are reminiscent of the traumatic event.: (Status: improved).  Experiences disturbances in sleep.: (Status: improved). Experiences disturbing  and persistent thoughts, images, and/or perceptions of the traumatic event.: (Status: improved). Experiences frequent nightmares.: (Status: improved).  Feelings of hopelessness, worthlessness, or inappropriate guilt.: (Status:  improved). Has been exposed to a traumatic event involving actual or perceived threat of death or  serious injury.: (Status: maintained). Impairment in social, occupational, or  other areas of functioning.: (Status: improved). Intentionally avoids activities,  places, people, or objects (e.g., up-armored vehicles) that evoke memories of the event.:(Status: maintained). Intentionally avoids thoughts, feelings, or discussions related  to the traumatic event.: (Status: improved). Reports difficulty concentrating as  well as feelings of guilt.:  (Status: improved). Reports response of intense fear,  helplessness, or horror to the traumatic event.:  (Status: improved).  Problems Addressed: PTSD   Goals:  LTG:  1. Develop healthy thinking patterns and beliefs about self, others, and the world that lead to the alleviation and help prevent the relapse of  depression.  40% Objective Identify and replace thoughts and beliefs that support depression.  30 % Target Date: 2021-05-01 Frequency: Weekly Progress: 30 Modality: individual 2. Eliminate or reduce the negative impact trauma related symptoms have  on social, occupational, and family functioning.  50% Objective Learn and  implement personal skills to manage challenging situations related to trauma. Target Date:  2021-05-01 Frequency: Weekly Progress: 30 Modality: individual3. No longer avoids persons, places, activities, and objects that are  reminiscent of the traumatic event. Objective 3.  Participate in Eye Movement Desensitization and Reprocessing (EMDR) to reduce emotional distress  related to traumatic thoughts, feelings, and images. Target Date: 2021-05-01 Frequency: Weekly Progress: 30 Modality: individual 4.  Learn and implement guided self-dialogue to manage thoughts, feelings, and urges brought on by  encounters with trauma-related situations. Target Date: 2021-05-01 Frequency: Weekly Progress: 30 Modality: individual 5.  No longer experiences intrusive event recollections, avoidance of event  reminders, intense arousal, or disinterest in activities or  relationships. Target Date:05/01/2021  Frequency : Weekly Progress 30    Modality: individual 6.Thinks about or openly discusses the traumatic event with others  without experiencing psychological or physiological distress. Target Date : 05/01/2021  Frequency: Weekly Progress:30   Modality: Individual Interventions by Therapist:  CBT, problem solving therapy, EMDR, insight oriented.  Patient approved Treatment plan.  Kenyonna Micek G Bonner Larue, LCSW

## 2021-04-22 ENCOUNTER — Other Ambulatory Visit: Payer: Self-pay

## 2021-04-22 ENCOUNTER — Ambulatory Visit (INDEPENDENT_AMBULATORY_CARE_PROVIDER_SITE_OTHER): Payer: Medicare Other | Admitting: Psychology

## 2021-04-22 DIAGNOSIS — F4310 Post-traumatic stress disorder, unspecified: Secondary | ICD-10-CM | POA: Diagnosis not present

## 2021-04-22 NOTE — Progress Notes (Signed)
[facility] Counselor/Therapist Progress Note  Patient ID: Dawn Simmons, MRN: [id number],    Date: 04/22/2021  Time Spent: 60 minutes  Treatment Type: Individual Therapy  Reported Symptoms: anxiety and depression and memory loss  Mental Status Exam: Appearance:  Casual     Behavior: Appropriate  Motor: Normal  Speech/Language:  Normal Rate  Affect: Blunt  Mood: anxious  Thought process: normal  Thought content:   WNL  Sensory/Perceptual disturbances:   WNL  Orientation: oriented to person, place, time/date, and situation  Attention: Good  Concentration: Good  Memory: WNL  Fund of knowledge:  Good  Insight:   Good  Judgment:  Good  Impulse Control: Good   Risk Assessment: Danger to Self:  No Self-injurious Behavior: No Danger to Others: No Duty to Warn:no Physical Aggression / Violence:No  Access to Firearms a concern: No  Gang Involvement:No   Subjective: The patient attended a face-to-face individual therapy session in the office today.  The patient presents as anxious today.  She says that three days after she left here she was not feeling quite like herself.  We talked about her symptoms and she talked about feeling dizzy and just not right.  We processed what could be happening and it seems that it is possible that she could be having some symptoms from decreasing her valium a few weeks ago.  She went from a 5mg  to 2 1/2 mg.  We talked about just watching the situation for a while and seeing if it is going to resolve itself.  She did not seem to have any symptoms of physical illness and the dizziness has resolved somewhat.    Interventions: Cognitive Behavioral Therapy and Eye Movement Desensitization and Reprocessing (EMDR)  Diagnosis:PTSD (post-traumatic stress disorder)  Plan: Treatment Plan  Strengths/Abilities:Insightful, motivated, supportive husband  Treatment Preferences: Outpatient Individual therapy  Statement of Needs: "I have a  problem passing out and I was told I have PTSD"    Symptoms:  Demonstrates an exaggerated startle response.:(Status: improved). Depressed  or irritable mood.: (Status: improved). Describes a reliving of the event,  particularly through dissociative flashbacks.:  (Status: improved). Displays a  significant decline in interest and engagement in activities.: (Status: improved). Displays significant psychological and/or physiological distress resulting from internal and external  clues that are reminiscent of the traumatic event.: (Status: improved).  Experiences disturbances in sleep.: (Status: improved). Experiences disturbing  and persistent thoughts, images, and/or perceptions of the traumatic event.: (Status: improved). Experiences frequent nightmares.: (Status: improved).  Feelings of hopelessness, worthlessness, or inappropriate guilt.: (Status:  improved). Has been exposed to a traumatic event involving actual or perceived threat of death or  serious injury.: (Status: maintained). Impairment in social, occupational, or  other areas of functioning.: (Status: improved). Intentionally avoids activities,  places, people, or objects (e.g., up-armored vehicles) that evoke memories of the event.:(Status: maintained). Intentionally avoids thoughts, feelings, or discussions related  to the traumatic event.: (Status: improved). Reports difficulty concentrating as  well as feelings of guilt.:  (Status: improved). Reports response of intense fear,  helplessness, or horror to the traumatic event.:  (Status: improved).  Problems Addressed: PTSD   Goals:  LTG:  1. Develop healthy thinking patterns and beliefs about self, others, and the world that lead to the alleviation and help prevent the relapse of  depression.  40% Objective Identify and replace thoughts and beliefs that support depression.  30 % Target Date: 2021-05-01 Frequency: Weekly Progress: 30 Modality: individual 2. Eliminate or  reduce the negative impact  trauma related symptoms have  on social, occupational, and family functioning.  50% Objective Learn and implement personal skills to manage challenging situations related to trauma. Target Date: 2021-05-01 Frequency: Weekly Progress: 30 Modality: individual3. No longer avoids persons, places, activities, and objects that are  reminiscent of the traumatic event. Objective 3.  Participate in Eye Movement Desensitization and Reprocessing (EMDR) to reduce emotional distress  related to traumatic thoughts, feelings, and images. Target Date: 2021-05-01 Frequency: Weekly Progress: 30 Modality: individual 4.  Learn and implement guided self-dialogue to manage thoughts, feelings, and urges brought on by  encounters with trauma-related situations. Target Date: 2021-05-01 Frequency: Weekly Progress: 30 Modality: individual 5.  No longer experiences intrusive event recollections, avoidance of event  reminders, intense arousal, or disinterest in activities or  relationships. Target Date:05/01/2021  Frequency : Weekly Progress 30    Modality: individual 6.Thinks about or openly discusses the traumatic event with others  without experiencing psychological or physiological distress. Target Date : 05/01/2021  Frequency: Weekly Progress:30   Modality: Individual Interventions by Therapist:  CBT, problem solving therapy, EMDR, insight oriented.  Patient approved Treatment plan.  Forrest Jaroszewski G Ronisha Herringshaw, LCSW                  Jacai Kipp G Jolita Haefner, LCSW

## 2021-04-29 ENCOUNTER — Ambulatory Visit (INDEPENDENT_AMBULATORY_CARE_PROVIDER_SITE_OTHER): Payer: Medicare Other | Admitting: Psychology

## 2021-04-29 ENCOUNTER — Other Ambulatory Visit: Payer: Self-pay

## 2021-04-29 DIAGNOSIS — F4310 Post-traumatic stress disorder, unspecified: Secondary | ICD-10-CM | POA: Diagnosis not present

## 2021-04-29 NOTE — Progress Notes (Signed)
[facility] Counselor/Therapist Progress Note  Patient ID: Shuronda Heidelberger, MRN: [id number],    Date: 04/29/2021  Time Spent: 60 minutes  Treatment Type: Individual Therapy  Reported Symptoms: anxiety and depression and memory loss  Mental Status Exam: Appearance:  Casual     Behavior: Appropriate  Motor: Normal  Speech/Language:  Normal Rate  Affect: Blunt  Mood: anxious  Thought process: normal  Thought content:   WNL  Sensory/Perceptual disturbances:   WNL  Orientation: oriented to person, place, time/date, and situation  Attention: Good  Concentration: Good  Memory: WNL  Fund of knowledge:  Good  Insight:   Good  Judgment:  Good  Impulse Control: Good   Risk Assessment: Danger to Self:  No Self-injurious Behavior: No Danger to Others: No Duty to Warn:no Physical Aggression / Violence:No  Access to Firearms a concern: No  Gang Involvement:No   Subjective: The patient attended a face-to-face individual therapy session in the office today.  The patient presents as pleasant and cooperative.  The patient reports that she is feeling much better than she was before our last session.  The patient states that she has not had any of the dizziness or symptoms she had before since our last visit.  She states that she felt much better after having identified what the cause of the problem was.  The patient reports that she is having a good week and she continues to try to dance and be as positive as possible.  She reports that she has been able to look at pictures of her grandson without being sad.  We are continuing to work towards stabilization of her posttraumatic stress symptoms and I encouraged her to continue to do the things that help her to remain stable.  Patient is making good progress.    Interventions: Cognitive Behavioral Therapy and Eye Movement Desensitization and Reprocessing (EMDR)  Diagnosis:PTSD (post-traumatic stress disorder)  Plan: Treatment  Plan  Strengths/Abilities:Insightful, motivated, supportive husband  Treatment Preferences: Outpatient Individual therapy  Statement of Needs: "I have a problem passing out and I was told I have PTSD"    Symptoms:  Demonstrates an exaggerated startle response.:(Status: improved). Depressed  or irritable mood.: (Status: improved). Describes a reliving of the event,  particularly through dissociative flashbacks.:  (Status: improved). Displays a  significant decline in interest and engagement in activities.: (Status: improved). Displays significant psychological and/or physiological distress resulting from internal and external  clues that are reminiscent of the traumatic event.: (Status: improved).  Experiences disturbances in sleep.: (Status: improved). Experiences disturbing  and persistent thoughts, images, and/or perceptions of the traumatic event.: (Status: improved). Experiences frequent nightmares.: (Status: improved).  Feelings of hopelessness, worthlessness, or inappropriate guilt.: (Status:  improved). Has been exposed to a traumatic event involving actual or perceived threat of death or  serious injury.: (Status: maintained). Impairment in social, occupational, or  other areas of functioning.: (Status: improved). Intentionally avoids activities,  places, people, or objects (e.g., up-armored vehicles) that evoke memories of the event.:(Status: maintained). Intentionally avoids thoughts, feelings, or discussions related  to the traumatic event.: (Status: improved). Reports difficulty concentrating as  well as feelings of guilt.:  (Status: improved). Reports response of intense fear,  helplessness, or horror to the traumatic event.:  (Status: improved).  Problems Addressed: PTSD   Goals:  LTG:  1. Develop healthy thinking patterns and beliefs about self, others, and the world that lead to the alleviation and help prevent the relapse of  depression.  40% Objective Identify and  replace  thoughts and beliefs that support depression.  30 % Target Date: 2022-05-01 Frequency: Weekly Progress: 30 Modality: individual 2. Eliminate or reduce the negative impact trauma related symptoms have  on social, occupational, and family functioning.  50% Objective Learn and implement personal skills to manage challenging situations related to trauma. Target Date: 2022-05-01 Frequency: Weekly Progress: 30 Modality: individual3. No longer avoids persons, places, activities, and objects that are  reminiscent of the traumatic event. Objective 3.  Participate in Eye Movement Desensitization and Reprocessing (EMDR) to reduce emotional distress  related to traumatic thoughts, feelings, and images. Target Date: 2022-05-01 Frequency: Weekly Progress: 30 Modality: individual 4.  Learn and implement guided self-dialogue to manage thoughts, feelings, and urges brought on by  encounters with trauma-related situations. Target Date: 2022-05-01 Frequency: Weekly Progress: 30 Modality: individual 5.  No longer experiences intrusive event recollections, avoidance of event  reminders, intense arousal, or disinterest in activities or  relationships. Target Date:05/01/2022  Frequency : Weekly Progress 30    Modality: individual 6.Thinks about or openly discusses the traumatic event with others  without experiencing psychological or physiological distress. Target Date : 05/01/2022 Frequency: Weekly Progress:30   Modality: Individual Interventions by Therapist:  CBT, problem solving therapy, EMDR, insight oriented.  Patient approved Treatment plan.  Esaiah Wanless G Trenita Hulme, LCSW                  Willer Osorno G Kassidee Narciso, LCSW               Baxter Gonzalez G Damier Disano, LCSW

## 2021-05-06 ENCOUNTER — Other Ambulatory Visit: Payer: Self-pay

## 2021-05-06 ENCOUNTER — Ambulatory Visit (INDEPENDENT_AMBULATORY_CARE_PROVIDER_SITE_OTHER): Payer: Medicare Other | Admitting: Psychology

## 2021-05-06 DIAGNOSIS — F4310 Post-traumatic stress disorder, unspecified: Secondary | ICD-10-CM

## 2021-05-06 NOTE — Progress Notes (Signed)
Behavioral Health Counselor/Therapist Progress Note  Patient ID: Dawn Simmons, MRN: [id number],    Date: 05/06/2021  Time Spent: 60 minutes  Treatment Type: Individual Therapy  Reported Symptoms: anxiety and depression and memory loss  Mental Status Exam: Appearance:  Casual     Behavior: Appropriate  Motor: Normal  Speech/Language:  Normal Rate  Affect: Blunt  Mood: anxious  Thought process: normal  Thought content:   WNL  Sensory/Perceptual disturbances:   WNL  Orientation: oriented to person, place, time/date, and situation  Attention: Good  Concentration: Good  Memory: WNL  Fund of knowledge:  Good  Insight:   Good  Judgment:  Good  Impulse Control: Good   Risk Assessment: Danger to Self:  No Self-injurious Behavior: No Danger to Others: No Duty to Warn:no Physical Aggression / Violence:No  Access to Firearms a concern: No  Gang Involvement:No   Subjective: The patient attended a face-to-face individual therapy session in the office today.  The patient presents as pleasant and cooperative.  The patient states that she has been doing well this week.  The patient reports that her son came over and they were talking about medications and the patient said that she is interested in getting off of all of her medications at some point.  She said that she wanted to try to decrease the Prozac when she sees her medication provider next time.  We talked about this and I recommended that she continue to stay on the same dose of Prozac at this point in time as she just decreased her Valium to 2-1/2 mg.  The patient had some withdrawal symptoms a couple of weeks ago and I feel like it is we need some stability before she tries to decrease her medicines some more.  The patient reports that she would like to in the next year get herself a part-time job and be able to feel better.  The patient states that she feels much better than she used to feel and she is almost back to  normal.  The patient has not had any episodes of passing out since she began therapy and medication management.  Interventions: Cognitive Behavioral Therapy and Eye Movement Desensitization and Reprocessing (EMDR)  Diagnosis:PTSD (post-traumatic stress disorder)  Plan: Treatment Plan  Strengths/Abilities:Insightful, motivated, supportive husband  Treatment Preferences: Outpatient Individual therapy  Statement of Needs: "I have a problem passing out and I was told I have PTSD"    Symptoms:  Demonstrates an exaggerated startle response.:(Status: improved). Depressed  or irritable mood.: (Status: improved). Describes a reliving of the event,  particularly through dissociative flashbacks.:  (Status: improved). Displays a  significant decline in interest and engagement in activities.: (Status: improved). Displays significant psychological and/or physiological distress resulting from internal and external  clues that are reminiscent of the traumatic event.: (Status: improved).  Experiences disturbances in sleep.: (Status: improved). Experiences disturbing  and persistent thoughts, images, and/or perceptions of the traumatic event.: (Status: improved). Experiences frequent nightmares.: (Status: improved).  Feelings of hopelessness, worthlessness, or inappropriate guilt.: (Status:  improved). Has been exposed to a traumatic event involving actual or perceived threat of death or  serious injury.: (Status: maintained). Impairment in social, occupational, or  other areas of functioning.: (Status: improved). Intentionally avoids activities,  places, people, or objects (e.g., up-armored vehicles) that evoke memories of the event.:(Status: maintained). Intentionally avoids thoughts, feelings, or discussions related  to the traumatic event.: (Status: improved). Reports difficulty concentrating as  well as feelings of guilt.:  (Status: improved). Reports response  of intense fear,  helplessness, or  horror to the traumatic event.:  (Status: improved).  Problems Addressed: PTSD   Goals:  LTG:  1. Develop healthy thinking patterns and beliefs about self, others, and the world that lead to the alleviation and help prevent the relapse of  depression.  40% Objective Identify and replace thoughts and beliefs that support depression.  30 % Target Date: 2022-05-01 Frequency: Weekly Progress: 30 Modality: individual 2. Eliminate or reduce the negative impact trauma related symptoms have  on social, occupational, and family functioning.  50% Objective Learn and implement personal skills to manage challenging situations related to trauma. Target Date: 2022-05-01 Frequency: Weekly Progress: 30 Modality: individual3. No longer avoids persons, places, activities, and objects that are  reminiscent of the traumatic event. Objective 3.  Participate in Eye Movement Desensitization and Reprocessing (EMDR) to reduce emotional distress  related to traumatic thoughts, feelings, and images. Target Date: 2022-05-01 Frequency: Weekly Progress: 30 Modality: individual 4.  Learn and implement guided self-dialogue to manage thoughts, feelings, and urges brought on by  encounters with trauma-related situations. Target Date: 2022-05-01 Frequency: Weekly Progress: 30 Modality: individual 5.  No longer experiences intrusive event recollections, avoidance of event  reminders, intense arousal, or disinterest in activities or  relationships. Target Date:05/01/2022  Frequency : Weekly Progress 30    Modality: individual 6.Thinks about or openly discusses the traumatic event with others  without experiencing psychological or physiological distress. Target Date : 05/01/2022 Frequency: Weekly Progress:30   Modality: Individual Interventions by Therapist:  CBT, problem solving therapy, EMDR, insight oriented.  Patient approved Treatment plan.  Katlyne Nishida G Davan Hark, LCSW                  Chaeli Judy G Jurni Cesaro,  LCSW               Allysa Governale G Trey Bebee, LCSW               Shruthi Northrup G Dennison Mcdaid, LCSW

## 2021-05-13 ENCOUNTER — Other Ambulatory Visit: Payer: Self-pay

## 2021-05-13 ENCOUNTER — Ambulatory Visit (INDEPENDENT_AMBULATORY_CARE_PROVIDER_SITE_OTHER): Payer: Medicare Other | Admitting: Psychology

## 2021-05-13 DIAGNOSIS — F4310 Post-traumatic stress disorder, unspecified: Secondary | ICD-10-CM

## 2021-05-13 NOTE — Progress Notes (Signed)
[facility] Counselor/Therapist Progress Note  Patient ID: Bertina Ogle, MRN: [id number],    Date: 05/13/2021  Time Spent: 60 minutes  Treatment Type: Individual Therapy  Reported Symptoms: anxiety and depression and memory loss  Mental Status Exam: Appearance:  Casual     Behavior: Appropriate  Motor: Normal  Speech/Language:  Normal Rate  Affect: Blunt  Mood: anxious  Thought process: normal  Thought content:   WNL  Sensory/Perceptual disturbances:   WNL  Orientation: oriented to person, place, time/date, and situation  Attention: Good  Concentration: Good  Memory: WNL  Fund of knowledge:  Good  Insight:   Good  Judgment:  Good  Impulse Control: Good   Risk Assessment: Danger to Self:  No Self-injurious Behavior: No Danger to Others: No Duty to Warn:no Physical Aggression / Violence:No  Access to Firearms a concern: No  Gang Involvement:No   Subjective: The patient attended a face-to-face individual therapy session in the office today.  The patient presents with a blunted affect and mood is pleasant.  The patient reports that things are not going quite as well this week.  When asked to identify what it is that is not going well the patient was unable to really state what was a problem.  There were times that she states that she does not like her husband.  However, they have been married for 50+ years.  I encouraged the patient to work with herself on remaining positive as she has gotten back into some of her negative thinking.  We reframed several of her thoughts and work on trying to get her in a different head space.  The patient struggles with the wintertime and I encouraged her to focus on it being spring in a few months.  The patient continues to make progress however she at times will slide back into negative ruminating.    Interventions: Cognitive Behavioral Therapy and Eye Movement Desensitization and Reprocessing (EMDR)  Diagnosis:PTSD  (post-traumatic stress disorder)  Plan: Treatment Plan  Strengths/Abilities:Insightful, motivated, supportive husband  Treatment Preferences: Outpatient Individual therapy  Statement of Needs: "I have a problem passing out and I was told I have PTSD"    Symptoms:  Demonstrates an exaggerated startle response.:(Status: improved). Depressed  or irritable mood.: (Status: improved). Describes a reliving of the event,  particularly through dissociative flashbacks.:  (Status: improved). Displays a  significant decline in interest and engagement in activities.: (Status: improved). Displays significant psychological and/or physiological distress resulting from internal and external  clues that are reminiscent of the traumatic event.: (Status: improved).  Experiences disturbances in sleep.: (Status: improved). Experiences disturbing  and persistent thoughts, images, and/or perceptions of the traumatic event.: (Status: improved). Experiences frequent nightmares.: (Status: improved).  Feelings of hopelessness, worthlessness, or inappropriate guilt.: (Status:  improved). Has been exposed to a traumatic event involving actual or perceived threat of death or  serious injury.: (Status: maintained). Impairment in social, occupational, or  other areas of functioning.: (Status: improved). Intentionally avoids activities,  places, people, or objects (e.g., up-armored vehicles) that evoke memories of the event.:(Status: maintained). Intentionally avoids thoughts, feelings, or discussions related  to the traumatic event.: (Status: improved). Reports difficulty concentrating as  well as feelings of guilt.:  (Status: improved). Reports response of intense fear,  helplessness, or horror to the traumatic event.:  (Status: improved).  Problems Addressed: PTSD   Goals:  LTG:  1. Develop healthy thinking patterns and beliefs about self, others, and the world that lead to the alleviation and help prevent the  relapse of  depression.  40% Objective Identify and replace thoughts and beliefs that support depression.  30 % Target Date: 2022-05-01 Frequency: Weekly Progress: 30 Modality: individual 2. Eliminate or reduce the negative impact trauma related symptoms have  on social, occupational, and family functioning.  50% Objective Learn and implement personal skills to manage challenging situations related to trauma. Target Date: 2022-05-01 Frequency: Weekly Progress: 30 Modality: individual3. No longer avoids persons, places, activities, and objects that are  reminiscent of the traumatic event. Objective 3.  Participate in Eye Movement Desensitization and Reprocessing (EMDR) to reduce emotional distress  related to traumatic thoughts, feelings, and images. Target Date: 2022-05-01 Frequency: Weekly Progress: 30 Modality: individual 4.  Learn and implement guided self-dialogue to manage thoughts, feelings, and urges brought on by  encounters with trauma-related situations. Target Date: 2022-05-01 Frequency: Weekly Progress: 30 Modality: individual 5.  No longer experiences intrusive event recollections, avoidance of event  reminders, intense arousal, or disinterest in activities or  relationships. Target Date:05/01/2022  Frequency : Weekly Progress 30    Modality: individual 6.Thinks about or openly discusses the traumatic event with others  without experiencing psychological or physiological distress. Target Date : 05/01/2022 Frequency: Weekly Progress:30   Modality: Individual Interventions by Therapist:  CBT, problem solving therapy, EMDR, insight oriented.  Patient approved Treatment plan.  Chadwick Reiswig G Jovahn Breit, LCSW                  Nayellie Sanseverino G Gatlyn Lipari, LCSW               Krystianna Soth G Koni Kannan, LCSW               Emmerson Shuffield G Shane Badeaux, LCSW               Audley Hinojos G Lucah Petta, LCSW

## 2021-05-20 ENCOUNTER — Other Ambulatory Visit: Payer: Self-pay

## 2021-05-20 ENCOUNTER — Ambulatory Visit (INDEPENDENT_AMBULATORY_CARE_PROVIDER_SITE_OTHER): Payer: Medicare Other | Admitting: Psychology

## 2021-05-20 DIAGNOSIS — F4310 Post-traumatic stress disorder, unspecified: Secondary | ICD-10-CM | POA: Diagnosis not present

## 2021-05-20 NOTE — Progress Notes (Signed)
[facility] Counselor/Therapist Progress Note  Patient ID: Dawn Simmons, MRN: [id number],    Date: 05/20/2021  Time Spent: 60 minutes  Treatment Type: Individual Therapy  Reported Symptoms: anxiety and depression and memory loss  Mental Status Exam: Appearance:  Casual     Behavior: Appropriate  Motor: Normal  Speech/Language:  Normal Rate  Affect: Blunt  Mood: anxious  Thought process: normal  Thought content:   WNL  Sensory/Perceptual disturbances:   WNL  Orientation: oriented to person, place, time/date, and situation  Attention: Good  Concentration: Good  Memory: WNL  Fund of knowledge:  Good  Insight:   Good  Judgment:  Good  Impulse Control: Good   Risk Assessment: Danger to Self:  No Self-injurious Behavior: No Danger to Others: No Duty to Warn:no Physical Aggression / Violence:No  Access to Firearms a concern: No  Gang Involvement:No   Subjective: The patient attended a face-to-face individual therapy session in the office today.  The patient presents with a blunted affect and mood is pleasant.  The patient states that she has been doing okay this week.  We talked more about her stepping out of her comfort zone and looking for something that she might want to do.  The patient is very frightened still about COVID and has some stress about going out in public.  The patient mentioned that she wanted to get a job.  I told her that it might be a good idea for her to consider this at the same place she was at before as they have not opened doors to the public yet.  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  The patient could remain mask while doing this.  The patient is doing much better and she is requesting possibly to decrease her medications again I encouraged her to talk with Corie Chiquito, her medication provider about this.  I feel like it is probably not a great idea at this point to do that as she  continues to have some anxiety and difficulty with her emotions.   Interventions: Cognitive Behavioral Therapy and Eye Movement Desensitization and Reprocessing (EMDR)  Diagnosis:PTSD (post-traumatic stress disorder)  Plan: Treatment Plan  Strengths/Abilities:Insightful, motivated, supportive husband  Treatment Preferences: Outpatient Individual therapy  Statement of Needs: "I have a problem passing out and I was told I have PTSD"    Symptoms:  Demonstrates an exaggerated startle response.:(Status: improved). Depressed  or irritable mood.: (Status: improved). Describes a reliving of the event,  particularly through dissociative flashbacks.:  (Status: improved). Displays a  significant decline in interest and engagement in activities.: (Status: improved). Displays significant psychological and/or physiological distress resulting from internal and external  clues that are reminiscent of the traumatic event.: (Status: improved).  Experiences disturbances in sleep.: (Status: improved). Experiences disturbing  and persistent thoughts, images, and/or perceptions of the traumatic event.: (Status: improved). Experiences frequent nightmares.: (Status: improved).  Feelings of hopelessness, worthlessness, or inappropriate guilt.: (Status:  improved). Has been exposed to a traumatic event involving actual or perceived threat of death or  serious injury.: (Status: maintained). Impairment in social, occupational, or  other areas of functioning.: (Status: improved). Intentionally avoids activities,  places, people, or objects (e.g., up-armored vehicles) that evoke memories of the event.:(Status: maintained). Intentionally avoids thoughts, feelings, or discussions related  to the traumatic event.: (Status: improved). Reports difficulty concentrating as  well as feelings of guilt.:  (Status: improved). Reports response of intense fear,  helplessness, or horror  to the traumatic event.:  (Status:  improved).  Problems Addressed: PTSD   Goals:  LTG:  1. Develop healthy thinking patterns and beliefs about self, others, and the world that lead to the alleviation and help prevent the relapse of  depression.  40% Objective Identify and replace thoughts and beliefs that support depression.  30 % Target Date: 2022-05-01 Frequency: Weekly Progress: 30 Modality: individual 2. Eliminate or reduce the negative impact trauma related symptoms have  on social, occupational, and family functioning.  50% Objective Learn and implement personal skills to manage challenging situations related to trauma. Target Date: 2022-05-01 Frequency: Weekly Progress: 30 Modality: individual3. No longer avoids persons, places, activities, and objects that are  reminiscent of the traumatic event. Objective 3.  Participate in Eye Movement Desensitization and Reprocessing (EMDR) to reduce emotional distress  related to traumatic thoughts, feelings, and images. Target Date: 2022-05-01 Frequency: Weekly Progress: 30 Modality: individual 4.  Learn and implement guided self-dialogue to manage thoughts, feelings, and urges brought on by  encounters with trauma-related situations. Target Date: 2022-05-01 Frequency: Weekly Progress: 30 Modality: individual 5.  No longer experiences intrusive event recollections, avoidance of event  reminders, intense arousal, or disinterest in activities or  relationships. Target Date:05/01/2022  Frequency : Weekly Progress 30    Modality: individual 6.Thinks about or openly discusses the traumatic event with others  without experiencing psychological or physiological distress. Target Date : 05/01/2022 Frequency: Weekly Progress:30   Modality: Individual Interventions by Therapist:  CBT, problem solving therapy, EMDR, insight oriented.  Patient approved Treatment plan.  Sumayah Bearse G Tarris Delbene, LCSW                                     Jelicia Nantz G Phinneas Shakoor,  LCSW               Rawad Bochicchio G Valleri Hendricksen, LCSW               Liliah Dorian G Shaunessy Dobratz, LCSW

## 2021-05-21 ENCOUNTER — Telehealth: Payer: Self-pay | Admitting: Family Medicine

## 2021-05-21 NOTE — Telephone Encounter (Signed)
Pt states she is having right shoulder pain. She was trying to remember which side hurt a couple years ago,advised pt she went to physical therapy in 21 for left shoulder pain. She is hoping to see dr. Abner Greenspan before her appointment in April or if orders could be put in to help. She does not want to see another provider. Please advise.

## 2021-05-24 DIAGNOSIS — Z20828 Contact with and (suspected) exposure to other viral communicable diseases: Secondary | ICD-10-CM | POA: Diagnosis not present

## 2021-05-24 NOTE — Telephone Encounter (Signed)
Pt called back to speak with Lerry Liner, please advise.

## 2021-05-24 NOTE — Telephone Encounter (Signed)
Pt aware but declined because she is doing better

## 2021-05-24 NOTE — Telephone Encounter (Signed)
Left message with someone to have the pt call back

## 2021-05-27 ENCOUNTER — Ambulatory Visit (INDEPENDENT_AMBULATORY_CARE_PROVIDER_SITE_OTHER): Payer: Medicare Other | Admitting: Psychology

## 2021-05-27 ENCOUNTER — Other Ambulatory Visit: Payer: Self-pay

## 2021-05-27 DIAGNOSIS — F4310 Post-traumatic stress disorder, unspecified: Secondary | ICD-10-CM

## 2021-05-27 NOTE — Progress Notes (Signed)
[facility] Counselor/Therapist Progress Note  Patient ID: Dawn Simmons, MRN: [id number],    Date: 05/27/2021  Time Spent: 60 minutes  Treatment Type: Individual Therapy  Reported Symptoms: anxiety and depression and memory loss  Mental Status Exam: Appearance:  Casual     Behavior: Appropriate  Motor: Normal  Speech/Language:  Normal Rate  Affect: Blunt  Mood: anxious  Thought process: normal  Thought content:   WNL  Sensory/Perceptual disturbances:   WNL  Orientation: oriented to person, place, time/date, and situation  Attention: Good  Concentration: Good  Memory: WNL  Fund of knowledge:  Good  Insight:   Good  Judgment:  Good  Impulse Control: Good   Risk Assessment: Danger to Self:  No Self-injurious Behavior: No Danger to Others: No Duty to Warn:no Physical Aggression / Violence:No  Access to Firearms a concern: No  Gang Involvement:No   Subjective: The patient attended a face-to-face individual therapy session in the office today.  The patient presents with a blunted affect and mood is anxious.  The patient was fidgety today.  We talked about why she might be fidgety and she states that she does not know why.  I pushed her to think about what it was that the emotion is connected to and as we did some deep breathing it came to her that she was frustrated with her husband because he had gone to a Super Bowl party on Sunday at their daughter's house and she had asked him not to go because she was concerned about getting COVID.  She had personalized the situation and got upset because he chose to go to the Super Bowl party over her.  We talked about not taking things so personally and discussed the need for her to think of him more as a roommate as opposed to her husband.  When she does that it seems like her expectations are different.  I encouraged her to think more positively and to try to not be a victim anymore.    Interventions: Cognitive Behavioral  Therapy and Eye Movement Desensitization and Reprocessing (EMDR)  Diagnosis:PTSD (post-traumatic stress disorder)  Plan: Treatment Plan  Strengths/Abilities:Insightful, motivated, supportive husband  Treatment Preferences: Outpatient Individual therapy  Statement of Needs: "I have a problem passing out and I was told I have PTSD"    Symptoms:  Demonstrates an exaggerated startle response.:(Status: improved). Depressed  or irritable mood.: (Status: improved). Describes a reliving of the event,  particularly through dissociative flashbacks.:  (Status: improved). Displays a  significant decline in interest and engagement in activities.: (Status: improved). Displays significant psychological and/or physiological distress resulting from internal and external  clues that are reminiscent of the traumatic event.: (Status: improved).  Experiences disturbances in sleep.: (Status: improved). Experiences disturbing  and persistent thoughts, images, and/or perceptions of the traumatic event.: (Status: improved). Experiences frequent nightmares.: (Status: improved).  Feelings of hopelessness, worthlessness, or inappropriate guilt.: (Status:  improved). Has been exposed to a traumatic event involving actual or perceived threat of death or  serious injury.: (Status: maintained). Impairment in social, occupational, or  other areas of functioning.: (Status: improved). Intentionally avoids activities,  places, people, or objects (e.g., up-armored vehicles) that evoke memories of the event.:(Status: maintained). Intentionally avoids thoughts, feelings, or discussions related  to the traumatic event.: (Status: improved). Reports difficulty concentrating as  well as feelings of guilt.:  (Status: improved). Reports response of intense fear,  helplessness, or horror to the traumatic event.:  (Status: improved).  Problems Addressed: PTSD   Goals:  LTG:  1. Develop healthy thinking patterns and beliefs about  self, others, and the world that lead to the alleviation and help prevent the relapse of  depression.  40% Objective Identify and replace thoughts and beliefs that support depression.  30 % Target Date: 2022-05-01 Frequency: Weekly Progress: 30 Modality: individual 2. Eliminate or reduce the negative impact trauma related symptoms have  on social, occupational, and family functioning.  50% Objective Learn and implement personal skills to manage challenging situations related to trauma. Target Date: 2022-05-01 Frequency: Weekly Progress: 30 Modality: individual3. No longer avoids persons, places, activities, and objects that are  reminiscent of the traumatic event. Objective 3.  Participate in Eye Movement Desensitization and Reprocessing (EMDR) to reduce emotional distress  related to traumatic thoughts, feelings, and images. Target Date: 2022-05-01 Frequency: Weekly Progress: 30 Modality: individual 4.  Learn and implement guided self-dialogue to manage thoughts, feelings, and urges brought on by  encounters with trauma-related situations. Target Date: 2022-05-01 Frequency: Weekly Progress: 30 Modality: individual 5.  No longer experiences intrusive event recollections, avoidance of event  reminders, intense arousal, or disinterest in activities or  relationships. Target Date:05/01/2022  Frequency : Weekly Progress 30    Modality: individual 6.Thinks about or openly discusses the traumatic event with others  without experiencing psychological or physiological distress. Target Date : 05/01/2022 Frequency: Weekly Progress:30   Modality: Individual Interventions by Therapist:  CBT, problem solving therapy, EMDR, insight oriented.  Patient approved Treatment plan.  Sharnee Douglass G Jeromy Borcherding, LCSW                                     Nailyn Dearinger G Keren Alverio, LCSW               Gardiner Espana G Bashir Marchetti, LCSW               Anetta Olvera G Daionna Crossland,  LCSW               Latravious Levitt G Million Maharaj, LCSW

## 2021-05-28 ENCOUNTER — Ambulatory Visit (INDEPENDENT_AMBULATORY_CARE_PROVIDER_SITE_OTHER): Payer: Medicare Other | Admitting: Psychiatry

## 2021-05-28 ENCOUNTER — Encounter: Payer: Self-pay | Admitting: Psychiatry

## 2021-05-28 DIAGNOSIS — F4310 Post-traumatic stress disorder, unspecified: Secondary | ICD-10-CM | POA: Diagnosis not present

## 2021-05-28 DIAGNOSIS — G4700 Insomnia, unspecified: Secondary | ICD-10-CM | POA: Diagnosis not present

## 2021-05-28 MED ORDER — FLUOXETINE HCL 10 MG PO CAPS: 20.0000 mg | ORAL_CAPSULE | Freq: Every morning | ORAL | 0 refills | Status: DC

## 2021-05-28 NOTE — Progress Notes (Signed)
Dawn Simmons [id number] 1945-12-01 76 y.o.  Subjective:   Patient ID:  Dawn Simmons is a 76 y.o. (DOB [birth year]) female.  Chief Complaint:  Chief Complaint  Patient presents with   Follow-up    Anxiety, Depression, and insomnia    HPI Dawn Simmons presents to the office today for follow-up of anxiety, depression, and insomnia. She reports she is , "sometimes ok, sometimes not so ok." She reports feeling bored at times. She reports feeling "very lonely."   She reports that she has been taking 1/2 of a Diazepam tablet and this seems to be working ok. She reports that it is taking a little longer to fall asleep some nights, depending on what is going on. She reports some mild withdrawal s/s and that she felt light-headed when she initially reduced Diazepam and this resolved. She reports that she is sleeping about 9 hours most night and occasionally 10 hours.   She reports that she has anxiety at times "but I don't live anxious." She reports sadness about limitations and memory impairment. She reports that she "is not who I was" and used to be social and now spends most of her time alone. She has forgotten how to cook after enjoying cooking everything from scratch in the past. She reports low energy and motivation. She reports that she used to clean her house thoroughly every Friday. No longer feels the need to clean. Difficulty with concentration. Denies SI.   She continues to enjoy gardening.   She continues to see Merry Lofty, LCSW for therapy.   She would like to have a dog and her family has discouraged this. Has been able to talk with best friend by phone.   Past Psychiatric Medication Trials: (She reports that she is very sensitive to medication) Diazepam- Prescribed as need with limited improvement.  Prozac- reports that she has to take Prozac 20 mg in divided doses.   PHQ2-9    Flowsheet Row Clinical Support from 02/08/2021 in [facility] at [facility] Office Visit from 01/25/2021 in [place] at [facility] Clinical Support from 12/13/2019 in [facility]  PHQ-2 Total Score 1 2 0  PHQ-9 Total Score -- 7 --        Review of Systems:  Review of Systems  Musculoskeletal:  Positive for arthralgias. Negative for gait problem.  Neurological:  Negative for tremors.  Psychiatric/Behavioral:         Please refer to HPI   Medications: I have reviewed the patient's current medications.  Current Outpatient Medications  Medication Sig Dispense Refill   Ascorbic Acid (VITAMIN C) 100 MG tablet Take 100 mg by mouth daily.     Calcium-Magnesium 200-100 MG TABS Take 4 tablets by mouth daily. 2 tablets in the morning and 2 tablets in the evening     diazepam (VALIUM) 5 MG tablet Take 1 tablet (5 mg total) by mouth at bedtime. Take 1/2-1 tablet po QHS 90 tablet 0   ergocalciferol (DRISDOL) 200 MCG/ML drops Take by mouth daily. PURE brand     FLUoxetine (PROZAC) 10 MG capsule Take 2 capsules (20 mg total) by mouth every morning. 180 capsule 0   loratadine (CLARITIN) 10 MG tablet Take 10 mg by mouth daily.     MAGNESIUM PO Take 200 mg by mouth at bedtime.     Multiple Vitamin (MULTIVITAMIN) capsule Take 1 capsule by mouth daily.     nystatin (MYCOSTATIN) 100000  UNIT/ML suspension Apply to corners of mouth three x daily 60 mL 1   Omega-3 1000 MG CAPS Take by mouth.     triamcinolone cream (KENALOG) 0.1 % Apply 1 application topically 2 (two) times daily. 80 g 1   TURMERIC PO Take by mouth.     No current facility-administered medications for this visit.    Medication Side Effects: None  Allergies:  Allergies  Allergen Reactions   Doxycycline Swelling   Tetanus Toxoids Other (See Comments)    PAIN IN NECK AND FACE   Flexeril [Cyclobenzaprine] Other (See Comments)    Past Medical History:  Diagnosis Date   H/O fibromyalgia    H/O insomnia    H/O measles    H/O  rubella    H/O syncope    History of chicken pox 09/15/2019   History of palpitations    Hx of mumps    Mitral valve prolapse    Palpitations    Syncope    Von Willebrand disease     Past Medical History, Surgical history, Social history, and Family history were reviewed and updated as appropriate.   Please see review of systems for further details on the patient's review from today.   Objective:   Physical Exam:  There were no vitals taken for this visit.  Physical Exam Constitutional:      General: She is not in acute distress. Musculoskeletal:        General: No deformity.  Neurological:     Mental Status: She is alert and oriented to person, place, and time.     Coordination: Coordination normal.  Psychiatric:        Attention and Perception: Attention and perception normal. She does not perceive auditory or visual hallucinations.        Mood and Affect: Mood is anxious and depressed. Affect is not labile, blunt, angry or inappropriate.        Speech: Speech normal.        Behavior: Behavior normal.        Thought Content: Thought content normal. Thought content is not paranoid or delusional. Thought content does not include homicidal or suicidal ideation. Thought content does not include homicidal or suicidal plan.        Cognition and Memory: Cognition normal. She exhibits impaired recent memory.        Judgment: Judgment normal.     Comments: Insight intact    Lab Review:     Component Value Date/Time   NA 140 01/25/2021 1504   K 4.0 01/25/2021 1504   CL 106 01/25/2021 1504   CO2 25 01/25/2021 1504   GLUCOSE 88 01/25/2021 1504   BUN 17 01/25/2021 1504   CREATININE 0.88 01/25/2021 1504   CREATININE 0.97 (H) 01/10/2020 1358   CALCIUM 10.0 01/25/2021 1504   PROT 6.9 01/25/2021 1504   ALBUMIN 4.6 01/25/2021 1504   AST 23 01/25/2021 1504   ALT 15 01/25/2021 1504   ALKPHOS 67 01/25/2021 1504   BILITOT 0.6 01/25/2021 1504       Component Value Date/Time    WBC 4.0 01/25/2021 1504   RBC 4.08 01/25/2021 1504   HGB 12.9 01/25/2021 1504   HCT 38.1 01/25/2021 1504   PLT 272.0 01/25/2021 1504   MCV 93.4 01/25/2021 1504   MCH 31.3 01/10/2020 1358   MCHC 33.9 01/25/2021 1504   RDW 13.1 01/25/2021 1504   LYMPHSABS 1,197 01/10/2020 1358   MONOABS 0.5 10/31/2019 1509   EOSABS 198 01/10/2020 1358  BASOSABS 32 01/10/2020 1358    No results found for: POCLITH, LITHIUM   No results found for: PHENYTOIN, PHENOBARB, VALPROATE, CBMZ   .res Assessment: Plan:    Pt seen for 30 minutes and time spent discussing treatment plan. She reports that she prefers not to take medication and ideally would like to reduce Prozac, however after discussing medication with therapist she reports that she thinks she should continue current dose. Discussed that Prozac is indicated for both depression and anxiety. Pt reports that she would like to continue Prozac 20 mg po qd for depression and anxiety.  Continue Diazepam 5 mg 1/2-1 tab po QHS for insomnia.  Recommend continuing therapy with Bambi Cottle, LCSW.  Pt to follow-up in 4 months or sooner if clinically indicated.  Patient advised to contact office with any questions, adverse effects, or acute worsening in signs and symptoms.    Bianney was seen today for follow-up.  Diagnoses and all orders for this visit:  PTSD (post-traumatic stress disorder) -     FLUoxetine (PROZAC) 10 MG capsule; Take 2 capsules (20 mg total) by mouth every morning.  Insomnia, unspecified type     Please see After Visit Summary for patient specific instructions.  Future Appointments  Date Time Provider Department Center  06/03/2021  1:00 PM Cottle, Lynnell Dike, LCSW [facility] None  06/10/2021  1:00 PM Cottle, Bambi G, LCSW [facility] None  06/17/2021  1:00 PM Cottle, Bambi G, LCSW [facility] None  06/24/2021  1:00 PM Cottle, Bambi G, LCSW [facility] None  07/01/2021  1:00 PM Cottle, Bambi G, LCSW [facility] None  07/08/2021  1:00 PM Cottle,  Bambi G, LCSW [facility] None  07/15/2021  1:00 PM Cottle, Bambi G, LCSW [facility] None  07/22/2021  1:00 PM Cottle, Bambi G, LCSW [facility] None  08/02/2021  3:00 PM Bradd Canary, MD [facility]  09/22/2021 11:00 AM Corie Chiquito, PMHNP [facility] None    No orders of the defined types were placed in this encounter.   -------------------------------

## 2021-06-03 ENCOUNTER — Other Ambulatory Visit: Payer: Self-pay

## 2021-06-03 ENCOUNTER — Ambulatory Visit (INDEPENDENT_AMBULATORY_CARE_PROVIDER_SITE_OTHER): Payer: Medicare Other | Admitting: Psychology

## 2021-06-03 DIAGNOSIS — F4310 Post-traumatic stress disorder, unspecified: Secondary | ICD-10-CM

## 2021-06-03 NOTE — Progress Notes (Signed)
[facility] Counselor/Therapist Progress Note  Patient ID: Dawn Simmons, MRN: [id number],    Date: 06/03/2021  Time Spent: 60 minutes  Treatment Type: Individual Therapy  Reported Symptoms: anxiety and depression and memory loss  Mental Status Exam: Appearance:  Casual     Behavior: Appropriate  Motor: Normal  Speech/Language:  Normal Rate  Affect: Blunt  Mood: anxious  Thought process: normal  Thought content:   WNL  Sensory/Perceptual disturbances:   WNL  Orientation: oriented to person, place, time/date, and situation  Attention: Good  Concentration: Good  Memory: WNL  Fund of knowledge:  Good  Insight:   Good  Judgment:  Good  Impulse Control: Good   Risk Assessment: Danger to Self:  No Self-injurious Behavior: No Danger to Others: No Duty to Warn:no Physical Aggression / Violence:No  Access to Firearms a concern: No  Gang Involvement:No   Subjective: The patient attended a face-to-face individual therapy session in the office today.  The patient presents as pleasant and cooperative.  The patient reports that her husband was in the hospital for 3 days this past week.  The patient states that she actually missed them.  We talked about her family system and we also discussed how she is improving.  She is dancing and also exercising some.  She has been able to get out in her garden which makes her very happy.  The patient states that she is feeling better and feels like she is more like herself.  The patient was able to stay more positive today and did not look at negative feelings at all.  We will continue to work with patient using cognitive behavioral therapy and EMDR as needed.   Interventions: Cognitive Behavioral Therapy and Eye Movement Desensitization and Reprocessing (EMDR)  Diagnosis:PTSD (post-traumatic stress disorder)  Plan: Treatment Plan  Strengths/Abilities:Insightful, motivated, supportive husband  Treatment Preferences: Outpatient  Individual therapy  Statement of Needs: "I have a problem passing out and I was told I have PTSD"    Symptoms:  Demonstrates an exaggerated startle response.:(Status: improved). Depressed  or irritable mood.: (Status: improved). Describes a reliving of the event,  particularly through dissociative flashbacks.:  (Status: improved). Displays a  significant decline in interest and engagement in activities.: (Status: improved). Displays significant psychological and/or physiological distress resulting from internal and external  clues that are reminiscent of the traumatic event.: (Status: improved).  Experiences disturbances in sleep.: (Status: improved). Experiences disturbing  and persistent thoughts, images, and/or perceptions of the traumatic event.: (Status: improved). Experiences frequent nightmares.: (Status: improved).  Feelings of hopelessness, worthlessness, or inappropriate guilt.: (Status:  improved). Has been exposed to a traumatic event involving actual or perceived threat of death or  serious injury.: (Status: maintained). Impairment in social, occupational, or  other areas of functioning.: (Status: improved). Intentionally avoids activities,  places, people, or objects (e.g., up-armored vehicles) that evoke memories of the event.:(Status: maintained). Intentionally avoids thoughts, feelings, or discussions related  to the traumatic event.: (Status: improved). Reports difficulty concentrating as  well as feelings of guilt.:  (Status: improved). Reports response of intense fear,  helplessness, or horror to the traumatic event.:  (Status: improved).  Problems Addressed: PTSD   Goals:  LTG:  1. Develop healthy thinking patterns and beliefs about self, others, and the world that lead to the alleviation and help prevent the relapse of  depression.  40% Objective Identify and replace thoughts and beliefs that support depression.  30 % Target Date: 2022-05-01 Frequency:  Weekly Progress: 30 Modality: individual  2. Eliminate or reduce the negative impact trauma related symptoms have  on social, occupational, and family functioning.  50% Objective Learn and implement personal skills to manage challenging situations related to trauma. Target Date: 2022-05-01 Frequency: Weekly Progress: 30 Modality: individual3. No longer avoids persons, places, activities, and objects that are  reminiscent of the traumatic event. Objective 3.  Participate in Eye Movement Desensitization and Reprocessing (EMDR) to reduce emotional distress  related to traumatic thoughts, feelings, and images. Target Date: 2022-05-01 Frequency: Weekly Progress: 30 Modality: individual 4.  Learn and implement guided self-dialogue to manage thoughts, feelings, and urges brought on by  encounters with trauma-related situations. Target Date: 2022-05-01 Frequency: Weekly Progress: 30 Modality: individual 5.  No longer experiences intrusive event recollections, avoidance of event  reminders, intense arousal, or disinterest in activities or  relationships. Target Date:05/01/2022  Frequency : Weekly Progress 30    Modality: individual 6.Thinks about or openly discusses the traumatic event with others  without experiencing psychological or physiological distress. Target Date : 05/01/2022 Frequency: Weekly Progress:30   Modality: Individual Interventions by Therapist:  CBT, problem solving therapy, EMDR, insight oriented.  Patient approved Treatment plan.  Kaci Dillie G [place], LCSW                                     Klinton Candelas G Debi Cousin, LCSW               Trevyon Swor G Levar Fayson, LCSW               Nunzio Banet G Lenton Gendreau, LCSW               Esra Frankowski G Julina Altmann, LCSW               Marlow Berenguer G Callee Rohrig, LCSW

## 2021-06-10 ENCOUNTER — Ambulatory Visit (INDEPENDENT_AMBULATORY_CARE_PROVIDER_SITE_OTHER): Payer: Medicare Other | Admitting: Psychology

## 2021-06-10 ENCOUNTER — Other Ambulatory Visit: Payer: Self-pay

## 2021-06-10 DIAGNOSIS — F4310 Post-traumatic stress disorder, unspecified: Secondary | ICD-10-CM

## 2021-06-10 NOTE — Progress Notes (Signed)
[facility] Counselor/Therapist Progress Note ? ?Patient ID: Dawn Simmons, MRN: [id number],   ? ?Date: 06/10/2021 ? ?Time Spent: 60 minutes ? ?Treatment Type: Individual Therapy ? ?Reported Symptoms: anxiety and depression and memory loss ? ?Mental Status Exam: ?Appearance:  Casual     ?Behavior: Appropriate  ?Motor: Normal  ?Speech/Language:  Normal Rate  ?Affect: Blunt  ?Mood: anxious  ?Thought process: normal  ?Thought content:   WNL  ?Sensory/Perceptual disturbances:   WNL  ?Orientation: oriented to person, place, time/date, and situation  ?Attention: Good  ?Concentration: Good  ?Memory: WNL  ?Fund of knowledge:  Good  ?Insight:   Good  ?Judgment:  Good  ?Impulse Control: Good  ? ?Risk Assessment: ?Danger to Self:  No ?Self-injurious Behavior: No ?Danger to Others: No ?Duty to Warn:no ?Physical Aggression / Violence:No  ?Access to Firearms a concern: No  ?Gang Involvement:No  ? ?Subjective: The patient attended a face-to-face individual therapy session in the office today.  The patient presents as pleasant and cooperative.  The patient will reports that she had a moment last week where she felt like she might pass out again.  We processed this and talked about it possibly being related to her stress about her husband.  The patient tends to deny things and does not identify or label what her emotions are and what they are connected to.  I recommended that she document when this happens so that we can see what the pattern is and we will continue to monitor this.  Other than that she seems to be doing well and is much less negative .  She is sleeping well and she is not having any other significant problems related to her posttraumatic stress disorder. ? Interventions: Cognitive Behavioral Therapy and Eye Movement Desensitization and Reprocessing (EMDR) ? ?Diagnosis:PTSD (post-traumatic stress disorder) ? ?Plan: Treatment Plan ? ?Strengths/Abilities:Insightful, motivated, supportive  husband ? ?Treatment Preferences: Outpatient Individual therapy ? ?Statement of Needs: "I have a problem passing out and I was told I have PTSD"   ? ?Symptoms:  Demonstrates an exaggerated startle response.:(Status: improved). Depressed  ?or irritable mood.: (Status: improved). Describes a reliving of the event,  ?particularly through dissociative flashbacks.:  (Status: improved). Displays a  ?significant decline in interest and engagement in activities.: (Status: improved). ?Displays significant psychological and/or physiological distress resulting from internal and external  ?clues that are reminiscent of the traumatic event.: (Status: improved).  ?Experiences disturbances in sleep.: (Status: improved). Experiences disturbing  ?and persistent thoughts, images, and/or perceptions of the traumatic event.: ?(Status: improved). Experiences frequent nightmares.: (Status: improved).  ?Feelings of hopelessness, worthlessness, or inappropriate guilt.: (Status:  ?improved). Has been exposed to a traumatic event involving actual or perceived threat of death or  ?serious injury.: (Status: maintained). Impairment in social, occupational, or  ?other areas of functioning.: (Status: improved). Intentionally avoids activities,  ?places, people, or objects (e.g., up-armored vehicles) that evoke memories of the event.:(Status: maintained). Intentionally avoids thoughts, feelings, or discussions related  ?to the traumatic event.: (Status: improved). Reports difficulty concentrating as  ?well as feelings of guilt.:  (Status: improved). Reports response of intense fear,  ?helplessness, or horror to the traumatic event.:  (Status: improved). ? ?Problems Addressed: PTSD  ? ?Goals:  ?LTG:  1. Develop healthy thinking patterns and beliefs about self, others, and the ?world that lead to the alleviation and help prevent the relapse of  ?depression.  40% ?Objective ?Identify and replace thoughts and beliefs that support depression.  30  % ?Target Date: 2022-05-01 Frequency: Weekly ?  Progress: 30 Modality: individual ?2. Eliminate or reduce the negative impact trauma related symptoms have  ?on social, occupational, and family functioning.  50% ?Objective ?Learn and implement personal skills to manage challenging situations related to trauma. ?Target Date: 2022-05-01 Frequency: Weekly ?Progress: 30 Modality: individual3. No longer avoids persons, places, activities, and objects that are  ?reminiscent of the traumatic event. ?Objective ?3.  Participate in Eye Movement Desensitization and Reprocessing (EMDR) to reduce emotional distress  ?related to traumatic thoughts, feelings, and images. ?Target Date: 2022-05-01 Frequency: Weekly ?Progress: 30 Modality: individual ?4.  Learn and implement guided self-dialogue to manage thoughts, feelings, and urges brought on by  ?encounters with trauma-related situations. ?Target Date: 2022-05-01 Frequency: Weekly ?Progress: 30 Modality: individual ?5.  No longer experiences intrusive event recollections, avoidance of event  ?reminders, intense arousal, or disinterest in activities or  ?relationships. ?Target Date:05/01/2022  Frequency : Weekly ?Progress 30    Modality: individual ?6.Thinks about or openly discusses the traumatic event with others  ?without experiencing psychological or physiological distress. ?Target Date : 05/01/2022 Frequency: Weekly ?Progress:30   Modality: Individual ?Interventions by Therapist:  CBT, problem solving therapy, EMDR, insight oriented.  Patient approved Treatment plan. ? ?Arihaan Bellucci G Myonna Chisom, LCSW ? ? ? ? ? ? ? ? ? ? ? ? ? ? ? ? ? ? ? ? ? ? ? ? ? ? ? ? ? ? ? ? ? ? ? ? ?Solveig Fangman G Masao Junker, LCSW ? ? ? ? ? ? ? ? ? ? ? ? ? ? ?Hazelee Harbold G Hannalee Castor, LCSW ? ? ? ? ? ? ? ? ? ? ? ? ? ? ?Daphna Lafuente G Parrish Daddario, LCSW ? ? ? ? ? ? ? ? ? ? ? ? ? ? ?Kerrie Timm G Ceanna Wareing, LCSW ? ? ? ? ? ? ? ? ? ? ? ? ? ? ?Yan Pankratz G Ruger Saxer, LCSW ? ? ? ? ? ? ? ? ? ? ? ? ? ? ?Mykai Wendorf G Daniela Siebers, LCSW ?

## 2021-06-17 ENCOUNTER — Ambulatory Visit (INDEPENDENT_AMBULATORY_CARE_PROVIDER_SITE_OTHER): Payer: Medicare Other | Admitting: Psychology

## 2021-06-17 ENCOUNTER — Other Ambulatory Visit: Payer: Self-pay

## 2021-06-17 DIAGNOSIS — F4310 Post-traumatic stress disorder, unspecified: Secondary | ICD-10-CM

## 2021-06-17 NOTE — Progress Notes (Signed)
[facility] Counselor/Therapist Progress Note ? ?Patient ID: Dawn Simmons, MRN: [id number],   ? ?Date: 06/17/2021 ? ?Time Spent: 60 minutes ? ?Treatment Type: Individual Therapy ? ?Reported Symptoms: anxiety and depression and memory loss ? ?Mental Status Exam: ?Appearance:  Casual     ?Behavior: Appropriate  ?Motor: Normal  ?Speech/Language:  Normal Rate  ?Affect: Blunt  ?Mood: anxious  ?Thought process: normal  ?Thought content:   WNL  ?Sensory/Perceptual disturbances:   WNL  ?Orientation: oriented to person, place, time/date, and situation  ?Attention: Good  ?Concentration: Good  ?Memory: WNL  ?Fund of knowledge:  Good  ?Insight:   Good  ?Judgment:  Good  ?Impulse Control: Good  ? ?Risk Assessment: ?Danger to Self:  No ?Self-injurious Behavior: No ?Danger to Others: No ?Duty to Warn:no ?Physical Aggression / Violence:No  ?Access to Firearms a concern: No  ?Gang Involvement:No  ? ?Subjective: The patient attended a face-to-face individual therapy session in the office today.  The patient presents as pleasant and cooperative.  The patient reports that this week has been good.  She is doing a better job at not being so negative and looking for positive things.  We talked about her working in her yard as being very therapeutic for her and I also encouraged her to get back into some of the other things that she used to enjoy such as dancing, painting, writing poetry, and doing more things socially.  The patient has come a long way and she reports that she has not had any more incidents of feeling like she is going to pass out in the last week.  I think she may have been a little overwhelmed because her husband had been in the hospital and she was concerned about him being sick.  We will continue to monitor this situation. ? ? Interventions: Cognitive Behavioral Therapy and Eye Movement Desensitization and Reprocessing (EMDR) ? ?Diagnosis:PTSD (post-traumatic stress disorder) ? ?Plan: Treatment  Plan ? ?Strengths/Abilities:Insightful, motivated, supportive husband ? ?Treatment Preferences: Outpatient Individual therapy ? ?Statement of Needs: "I have a problem passing out and I was told I have PTSD"   ? ?Symptoms:  Demonstrates an exaggerated startle response.:(Status: improved). Depressed  ?or irritable mood.: (Status: improved). Describes a reliving of the event,  ?particularly through dissociative flashbacks.:  (Status: improved). Displays a  ?significant decline in interest and engagement in activities.: (Status: improved). ?Displays significant psychological and/or physiological distress resulting from internal and external  ?clues that are reminiscent of the traumatic event.: (Status: improved).  ?Experiences disturbances in sleep.: (Status: improved). Experiences disturbing  ?and persistent thoughts, images, and/or perceptions of the traumatic event.: ?(Status: improved). Experiences frequent nightmares.: (Status: improved).  ?Feelings of hopelessness, worthlessness, or inappropriate guilt.: (Status:  ?improved). Has been exposed to a traumatic event involving actual or perceived threat of death or  ?serious injury.: (Status: maintained). Impairment in social, occupational, or  ?other areas of functioning.: (Status: improved). Intentionally avoids activities,  ?places, people, or objects (e.g., up-armored vehicles) that evoke memories of the event.:(Status: maintained). Intentionally avoids thoughts, feelings, or discussions related  ?to the traumatic event.: (Status: improved). Reports difficulty concentrating as  ?well as feelings of guilt.:  (Status: improved). Reports response of intense fear,  ?helplessness, or horror to the traumatic event.:  (Status: improved). ? ?Problems Addressed: PTSD  ? ?Goals:  ?LTG:  1. Develop healthy thinking patterns and beliefs about self, others, and the ?world that lead to the alleviation and help prevent the relapse of  ?depression.  40% ?Objective ?Identify  and  replace thoughts and beliefs that support depression.  30 % ?Target Date: 2022-05-01 Frequency: Weekly ?Progress: 30 Modality: individual ?2. Eliminate or reduce the negative impact trauma related symptoms have  ?on social, occupational, and family functioning.  50% ?Objective ?Learn and implement personal skills to manage challenging situations related to trauma. ?Target Date: 2022-05-01 Frequency: Weekly ?Progress: 30 Modality: individual3. No longer avoids persons, places, activities, and objects that are  ?reminiscent of the traumatic event. ?Objective ?3.  Participate in Eye Movement Desensitization and Reprocessing (EMDR) to reduce emotional distress  ?related to traumatic thoughts, feelings, and images. ?Target Date: 2022-05-01 Frequency: Weekly ?Progress: 30 Modality: individual ?4.  Learn and implement guided self-dialogue to manage thoughts, feelings, and urges brought on by  ?encounters with trauma-related situations. ?Target Date: 2022-05-01 Frequency: Weekly ?Progress: 40 Modality: individual ?5.  No longer experiences intrusive event recollections, avoidance of event  ?reminders, intense arousal, or disinterest in activities or  ?relationships. ?Target Date:05/01/2022  Frequency : Weekly ?Progress 30    Modality: individual ?6.Thinks about or openly discusses the traumatic event with others  ?without experiencing psychological or physiological distress. ?Target Date : 05/01/2022 Frequency: Weekly ?Progress:30   Modality: Individual ?Interventions by Therapist:  CBT, problem solving therapy, EMDR, insight oriented.  Patient approved Treatment plan. ? ?Dawn Luoma G Loie Jahr, LCSW ? ? ? ? ? ? ? ? ? ? ? ? ? ? ? ? ? ? ? ? ? ? ? ? ? ? ? ? ? ? ? ? ? ? ? ? ?Dawn Bolding G Madelein Mahadeo, LCSW ? ? ? ? ? ? ? ? ? ? ? ? ? ? ?Dawn Longmore G Minka Knight, LCSW ? ? ? ? ? ? ? ? ? ? ? ? ? ? ?Dawn Lusk G Jamaiyah Pyle, LCSW ? ? ? ? ? ? ? ? ? ? ? ? ? ? ?Dawn Thomann G Shalane Florendo, LCSW ? ? ? ? ? ? ? ? ? ? ? ? ? ? ?Dawn Kilgallon G Tmya Wigington, LCSW ? ? ? ? ? ? ? ? ? ? ? ? ? ? ?Dawn Oats G Devansh Riese,  LCSW ? ? ? ? ? ? ? ? ? ? ? ? ? ? ?Dawn Cloe G Gardner Servantes, LCSW ?

## 2021-06-22 DIAGNOSIS — Z20822 Contact with and (suspected) exposure to covid-19: Secondary | ICD-10-CM | POA: Diagnosis not present

## 2021-06-23 DIAGNOSIS — Z20822 Contact with and (suspected) exposure to covid-19: Secondary | ICD-10-CM | POA: Diagnosis not present

## 2021-06-24 ENCOUNTER — Other Ambulatory Visit: Payer: Self-pay

## 2021-06-24 ENCOUNTER — Ambulatory Visit (INDEPENDENT_AMBULATORY_CARE_PROVIDER_SITE_OTHER): Payer: Medicare Other | Admitting: Psychology

## 2021-06-24 DIAGNOSIS — F4310 Post-traumatic stress disorder, unspecified: Secondary | ICD-10-CM | POA: Diagnosis not present

## 2021-06-24 NOTE — Progress Notes (Signed)
[facility] Counselor/Therapist Progress Note ? ?Patient ID: Dawn Simmons, MRN: [id number],   ? ?Date: 06/24/2021 ? ?Time Spent: 60 minutes ? ?Treatment Type: Individual Therapy ? ?Reported Symptoms: anxiety and depression and memory loss ? ?Mental Status Exam: ?Appearance:  Casual     ?Behavior: Appropriate  ?Motor: Normal  ?Speech/Language:  Normal Rate  ?Affect: Blunt  ?Mood: anxious  ?Thought process: normal  ?Thought content:   WNL  ?Sensory/Perceptual disturbances:   WNL  ?Orientation: oriented to person, place, time/date, and situation  ?Attention: Good  ?Concentration: Good  ?Memory: WNL  ?Fund of knowledge:  Good  ?Insight:   Good  ?Judgment:  Good  ?Impulse Control: Good  ? ?Risk Assessment: ?Danger to Self:  No ?Self-injurious Behavior: No ?Danger to Others: No ?Duty to Warn:no ?Physical Aggression / Violence:No  ?Access to Firearms a concern: No  ?Gang Involvement:No  ? ?Subjective: The patient attended a face-to-face individual therapy session in the office today.  The patient presents as more anxious today.  The patient states that she has been working in her yard and that makes her happy that she is not happy about the winter weather lately.  As we talked more we got more into some of the things that were bothering her.  The patient does not volunteer these things and it has to be pulled out of her basically.  The patient states that she got a call and her primary care doctor is not going to be seeing patients anymore for a while.  Any change like this is very distressing for the patient.  The patient also reports that her husband's sister would like to come down and spend some time at their house however she does not want her sister's husband to come with her.  The patient physically got upset about this and became very shaky and nervous.  I reassured her and I told her that if we need to set a limit with her husband and we can do that and that we would handle the situation prior to  her leaving the office.  The issue is that she does not have good coping skills and tends to want to stay in denial as opposed to dealing with situations. ? ? Interventions: Cognitive Behavioral Therapy and Eye Movement Desensitization and Reprocessing (EMDR) ? ?Diagnosis:PTSD (post-traumatic stress disorder) ? ?Plan: Treatment Plan ? ?Strengths/Abilities:Insightful, motivated, supportive husband ? ?Treatment Preferences: Outpatient Individual therapy ? ?Statement of Needs: "I have a problem passing out and I was told I have PTSD"   ? ?Symptoms:  Demonstrates an exaggerated startle response.:(Status: improved). Depressed  ?or irritable mood.: (Status: improved). Describes a reliving of the event,  ?particularly through dissociative flashbacks.:  (Status: improved). Displays a  ?significant decline in interest and engagement in activities.: (Status: improved). ?Displays significant psychological and/or physiological distress resulting from internal and external  ?clues that are reminiscent of the traumatic event.: (Status: improved).  ?Experiences disturbances in sleep.: (Status: improved). Experiences disturbing  ?and persistent thoughts, images, and/or perceptions of the traumatic event.: ?(Status: improved). Experiences frequent nightmares.: (Status: improved).  ?Feelings of hopelessness, worthlessness, or inappropriate guilt.: (Status:  ?improved). Has been exposed to a traumatic event involving actual or perceived threat of death or  ?serious injury.: (Status: maintained). Impairment in social, occupational, or  ?other areas of functioning.: (Status: improved). Intentionally avoids activities,  ?places, people, or objects (e.g., up-armored vehicles) that evoke memories of the event.:(Status: maintained). Intentionally avoids thoughts, feelings, or discussions related  ?to the traumatic event.: (Status:  improved). Reports difficulty concentrating as  ?well as feelings of guilt.:  (Status: improved). Reports  response of intense fear,  ?helplessness, or horror to the traumatic event.:  (Status: improved). ? ?Problems Addressed: PTSD  ? ?Goals:  ?LTG:  1. Develop healthy thinking patterns and beliefs about self, others, and the ?world that lead to the alleviation and help prevent the relapse of  ?depression.  40% ?Objective ?Identify and replace thoughts and beliefs that support depression.  30 % ?Target Date: 2022-05-01 Frequency: Weekly ?Progress: 30 Modality: individual ?2. Eliminate or reduce the negative impact trauma related symptoms have  ?on social, occupational, and family functioning.  50% ?Objective ?Learn and implement personal skills to manage challenging situations related to trauma. ?Target Date: 2022-05-01 Frequency: Weekly ?Progress: 30 Modality: individual3. No longer avoids persons, places, activities, and objects that are  ?reminiscent of the traumatic event. ?Objective ?3.  Participate in Eye Movement Desensitization and Reprocessing (EMDR) to reduce emotional distress  ?related to traumatic thoughts, feelings, and images. ?Target Date: 2022-05-01 Frequency: Weekly ?Progress: 30 Modality: individual ?4.  Learn and implement guided self-dialogue to manage thoughts, feelings, and urges brought on by  ?encounters with trauma-related situations. ?Target Date: 2022-05-01 Frequency: Weekly ?Progress: 40 Modality: individual ?5.  No longer experiences intrusive event recollections, avoidance of event  ?reminders, intense arousal, or disinterest in activities or  ?relationships. ?Target Date:05/01/2022  Frequency : Weekly ?Progress 30    Modality: individual ?6.Thinks about or openly discusses the traumatic event with others  ?without experiencing psychological or physiological distress. ?Target Date : 05/01/2022 Frequency: Weekly ?Progress:30   Modality: Individual ?Interventions by Therapist:  CBT, problem solving therapy, EMDR, insight oriented.  Patient approved Treatment plan. ? ?Dietrich Samuelson G Vertis Scheib,  LCSW ? ? ? ? ? ? ? ? ? ? ? ? ? ? ? ? ? ? ? ? ? ? ? ? ? ? ? ? ? ? ? ? ? ? ? ? ?Aitana Burry G Ruthetta Koopmann, LCSW ? ? ? ? ? ? ? ? ? ? ? ? ? ? ?Gearld Kerstein G Dimples Probus, LCSW ? ? ? ? ? ? ? ? ? ? ? ? ? ? ?Jaynell Castagnola G Deyana Wnuk, LCSW ? ? ? ? ? ? ? ? ? ? ? ? ? ? ?Jaxsyn Azam G Mekayla Soman, LCSW ? ? ? ? ? ? ? ? ? ? ? ? ? ? ?Raenette Sakata G Aliciana Ricciardi, LCSW ? ? ? ? ? ? ? ? ? ? ? ? ? ? ?Damarcus Reggio G Mairyn Lenahan, LCSW ? ? ? ? ? ? ? ? ? ? ? ? ? ? ?Jolinda Pinkstaff G Everlyn Farabaugh, LCSW ? ? ? ? ? ? ? ? ? ? ? ? ? ? ?Kayd Launer G Yulieth Carrender, LCSW ?

## 2021-06-29 DIAGNOSIS — Z20822 Contact with and (suspected) exposure to covid-19: Secondary | ICD-10-CM | POA: Diagnosis not present

## 2021-07-01 ENCOUNTER — Ambulatory Visit (INDEPENDENT_AMBULATORY_CARE_PROVIDER_SITE_OTHER): Payer: Medicare Other | Admitting: Psychology

## 2021-07-01 DIAGNOSIS — F4310 Post-traumatic stress disorder, unspecified: Secondary | ICD-10-CM | POA: Diagnosis not present

## 2021-07-02 NOTE — Progress Notes (Signed)
[facility] Counselor/Therapist Progress Note ? ?Patient ID: Dawn Simmons, MRN: [id number],   ? ?Date: 07/01/2021 ? ?Time Spent: 60 minutes ? ?Treatment Type: Individual Therapy ? ?Reported Symptoms: anxiety and depression and memory loss ? ?Mental Status Exam: ?Appearance:  Casual     ?Behavior: Appropriate  ?Motor: Normal  ?Speech/Language:  Normal Rate  ?Affect: Blunt  ?Mood: anxious  ?Thought process: normal  ?Thought content:   WNL  ?Sensory/Perceptual disturbances:   WNL  ?Orientation: oriented to person, place, time/date, and situation  ?Attention: Good  ?Concentration: Good  ?Memory: WNL  ?Fund of knowledge:  Good  ?Insight:   Good  ?Judgment:  Good  ?Impulse Control: Good  ? ?Risk Assessment: ?Danger to Self:  No ?Self-injurious Behavior: No ?Danger to Others: No ?Duty to Warn:no ?Physical Aggression / Violence:No  ?Access to Firearms a concern: No  ?Gang Involvement:No  ? ?Subjective: The patient attended a face-to-face individual therapy session in the office today.  The patient presents as anxious and her affect is blunted.  The patient reports that she and [name] has had a rough week.  The situation with her sister-in-law and brother-in-law came up again and the patient was able to tell her husband that she did not want them to stay at the house because she did not feel up to having them and he apparently became passive aggressive and get and state how he feels but was overtly angry with her.  We talked about coping strategies to handle this behavior and I encouraged her to continue to speak her mind and to go about her business and not worry about how he feels about it.  We talked about her staying up all night worrying about the situation and the situation was resolved.  We will continue to work with the patient on not reacting so much to his moods.  I told her that I was happy to bring him into the session and have a conversation with him about things that are not good for her.  She is  going to think about this and let me know if she would like to do that. ? ? Interventions: Cognitive Behavioral Therapy and Eye Movement Desensitization and Reprocessing (EMDR) ? ?Diagnosis:PTSD (post-traumatic stress disorder) ? ?Plan: Treatment Plan ? ?Strengths/Abilities:Insightful, motivated, supportive husband ? ?Treatment Preferences: Outpatient Individual therapy ? ?Statement of Needs: "I have a problem passing out and I was told I have PTSD"   ? ?Symptoms:  Demonstrates an exaggerated startle response.:(Status: improved). Depressed  ?or irritable mood.: (Status: improved). Describes a reliving of the event,  ?particularly through dissociative flashbacks.:  (Status: improved). Displays a  ?significant decline in interest and engagement in activities.: (Status: improved). ?Displays significant psychological and/or physiological distress resulting from internal and external  ?clues that are reminiscent of the traumatic event.: (Status: improved).  ?Experiences disturbances in sleep.: (Status: improved). Experiences disturbing  ?and persistent thoughts, images, and/or perceptions of the traumatic event.: ?(Status: improved). Experiences frequent nightmares.: (Status: improved).  ?Feelings of hopelessness, worthlessness, or inappropriate guilt.: (Status:  ?improved). Has been exposed to a traumatic event involving actual or perceived threat of death or  ?serious injury.: (Status: maintained). Impairment in social, occupational, or  ?other areas of functioning.: (Status: improved). Intentionally avoids activities,  ?places, people, or objects (e.g., up-armored vehicles) that evoke memories of the event.:(Status: maintained). Intentionally avoids thoughts, feelings, or discussions related  ?to the traumatic event.: (Status: improved). Reports difficulty concentrating as  ?well as feelings of guilt.:  (Status: improved). Reports response  of intense fear,  ?helplessness, or horror to the traumatic event.:  (Status:  improved). ? ?Problems Addressed: PTSD  ? ?Goals:  ?LTG:  1. Develop healthy thinking patterns and beliefs about self, others, and the ?world that lead to the alleviation and help prevent the relapse of  ?depression.  40% ?Objective ?Identify and replace thoughts and beliefs that support depression.  30 % ?Target Date: 2022-05-01 Frequency: Weekly ?Progress: 30 Modality: individual ?2. Eliminate or reduce the negative impact trauma related symptoms have  ?on social, occupational, and family functioning.  50% ?Objective ?Learn and implement personal skills to manage challenging situations related to trauma. ?Target Date: 2022-05-01 Frequency: Weekly ?Progress: 30 Modality: individual3. No longer avoids persons, places, activities, and objects that are  ?reminiscent of the traumatic event. ?Objective ?3.  Participate in Eye Movement Desensitization and Reprocessing (EMDR) to reduce emotional distress  ?related to traumatic thoughts, feelings, and images. ?Target Date: 2022-05-01 Frequency: Weekly ?Progress: 30 Modality: individual ?4.  Learn and implement guided self-dialogue to manage thoughts, feelings, and urges brought on by  ?encounters with trauma-related situations. ?Target Date: 2022-05-01 Frequency: Weekly ?Progress: 40 Modality: individual ?5.  No longer experiences intrusive event recollections, avoidance of event  ?reminders, intense arousal, or disinterest in activities or  ?relationships. ?Target Date:05/01/2022  Frequency : Weekly ?Progress 30    Modality: individual ?6.Thinks about or openly discusses the traumatic event with others  ?without experiencing psychological or physiological distress. ?Target Date : 05/01/2022 Frequency: Weekly ?Progress:30   Modality: Individual ?Interventions by Therapist:  CBT, problem solving therapy, EMDR, insight oriented.  Patient approved Treatment plan. ? ?Aamilah Augenstein G Chang Tiggs, LCSW ? ? ? ? ? ? ? ? ? ? ? ? ? ? ? ? ? ? ? ? ? ? ? ? ? ? ? ? ? ? ? ? ? ? ? ? ?Vercie Pokorny G Brinley Rosete,  LCSW ? ? ? ? ? ? ? ? ? ? ? ? ? ? ?Kanai Hilger G Jodi Kappes, LCSW ? ? ? ? ? ? ? ? ? ? ? ? ? ? ?Shondrea Steinert G Manolito Jurewicz, LCSW ? ? ? ? ? ? ? ? ? ? ? ? ? ? ?Jayliani Wanner G Kimora Stankovic, LCSW ? ? ? ? ? ? ? ? ? ? ? ? ? ? ?Berthel Bagnall G Oskar Cretella, LCSW ? ? ? ? ? ? ? ? ? ? ? ? ? ? ?Nitasha Jewel G Everlee Quakenbush, LCSW ? ? ? ? ? ? ? ? ? ? ? ? ? ? ?Jerrine Urschel G Carolyna Yerian, LCSW ? ? ? ? ? ? ? ? ? ? ? ? ? ? ?Rasool Rommel G Addalyn Speedy, LCSW ? ? ? ? ? ? ? ? ? ? ? ? ? ? ?Averi Kilty G Rajah Tagliaferro, LCSW ?

## 2021-07-08 ENCOUNTER — Ambulatory Visit (INDEPENDENT_AMBULATORY_CARE_PROVIDER_SITE_OTHER): Payer: Medicare Other | Admitting: Psychology

## 2021-07-08 DIAGNOSIS — F4310 Post-traumatic stress disorder, unspecified: Secondary | ICD-10-CM

## 2021-07-08 NOTE — Progress Notes (Signed)
[facility] Counselor/Therapist Progress Note ? ?Patient ID: Dawn Simmons, MRN: [id number],   ? ?Date: 07/08/2021 ? ?Time Spent: 60 minutes ? ?Treatment Type: Individual Therapy ? ?Reported Symptoms: anxiety and depression and memory loss ? ?Mental Status Exam: ?Appearance:  Casual     ?Behavior: Appropriate  ?Motor: Normal  ?Speech/Language:  Normal Rate  ?Affect: Blunt  ?Mood: anxious  ?Thought process: normal  ?Thought content:   WNL  ?Sensory/Perceptual disturbances:   WNL  ?Orientation: oriented to person, place, time/date, and situation  ?Attention: Good  ?Concentration: Good  ?Memory: WNL  ?Fund of knowledge:  Good  ?Insight:   Good  ?Judgment:  Good  ?Impulse Control: Good  ? ?Risk Assessment: ?Danger to Self:  No ?Self-injurious Behavior: No ?Danger to Others: No ?Duty to Warn:no ?Physical Aggression / Violence:No  ?Access to Firearms a concern: No  ?Gang Involvement:No  ? ?Subjective: The patient attended a face-to-face individual therapy session in the office today.  The patient presents as pleasant and cooperative.  She states that Dawn Simmons is going up to [facility] to visit his sister who is dying next week.  The patient reports that she feels better about his sister and brother-in-law are not coming down to visit.  The patient is going to stay by herself next week and feels a little anxious about this but seems to be handling it well.  We talked about how far she had come in therapy and with her medications and that she now can stay by herself without having someone there and her memory is much better.  I encouraged her to take care of herself and do a lot of fun things that she wants to do next week since Dawn Simmons will be gone. ? ? Interventions: Cognitive Behavioral Therapy and Eye Movement Desensitization and Reprocessing (EMDR) ? ?Diagnosis:PTSD (post-traumatic stress disorder) ? ?Plan: Treatment Plan ? ?Strengths/Abilities:Insightful, motivated, supportive husband ? ?Treatment  Preferences: Outpatient Individual therapy ? ?Statement of Needs: "I have a problem passing out and I was told I have PTSD"   ? ?Symptoms:  Demonstrates an exaggerated startle response.:(Status: improved). Depressed  ?or irritable mood.: (Status: improved). Describes a reliving of the event,  ?particularly through dissociative flashbacks.:  (Status: improved). Displays a  ?significant decline in interest and engagement in activities.: (Status: improved). ?Displays significant psychological and/or physiological distress resulting from internal and external  ?clues that are reminiscent of the traumatic event.: (Status: improved).  ?Experiences disturbances in sleep.: (Status: improved). Experiences disturbing  ?and persistent thoughts, images, and/or perceptions of the traumatic event.: ?(Status: improved). Experiences frequent nightmares.: (Status: improved).  ?Feelings of hopelessness, worthlessness, or inappropriate guilt.: (Status:  ?improved). Has been exposed to a traumatic event involving actual or perceived threat of death or  ?serious injury.: (Status: maintained). Impairment in social, occupational, or  ?other areas of functioning.: (Status: improved). Intentionally avoids activities,  ?places, people, or objects (e.g., up-armored vehicles) that evoke memories of the event.:(Status: maintained). Intentionally avoids thoughts, feelings, or discussions related  ?to the traumatic event.: (Status: improved). Reports difficulty concentrating as  ?well as feelings of guilt.:  (Status: improved). Reports response of intense fear,  ?helplessness, or horror to the traumatic event.:  (Status: improved). ? ?Problems Addressed: PTSD  ? ?Goals:  ?LTG:  1. Develop healthy thinking patterns and beliefs about self, others, and the ?world that lead to the alleviation and help prevent the relapse of  ?depression.  40% ?Objective ?Identify and replace thoughts and beliefs that support depression.  30 % ?Target Date:  2022-05-01  Frequency: Weekly ?Progress: 30 Modality: individual ?2. Eliminate or reduce the negative impact trauma related symptoms have  ?on social, occupational, and family functioning.  50% ?Objective ?Learn and implement personal skills to manage challenging situations related to trauma. ?Target Date: 2022-05-01 Frequency: Weekly ?Progress: 30 Modality: individual3. No longer avoids persons, places, activities, and objects that are  ?reminiscent of the traumatic event. ?Objective ?3.  Participate in Eye Movement Desensitization and Reprocessing (EMDR) to reduce emotional distress  ?related to traumatic thoughts, feelings, and images. ?Target Date: 2022-05-01 Frequency: Weekly ?Progress: 30 Modality: individual ?4.  Learn and implement guided self-dialogue to manage thoughts, feelings, and urges brought on by  ?encounters with trauma-related situations. ?Target Date: 2022-05-01 Frequency: Weekly ?Progress: 40 Modality: individual ?5.  No longer experiences intrusive event recollections, avoidance of event  ?reminders, intense arousal, or disinterest in activities or  ?relationships. ?Target Date:05/01/2022  Frequency : Weekly ?Progress 30    Modality: individual ?6.Thinks about or openly discusses the traumatic event with others  ?without experiencing psychological or physiological distress. ?Target Date : 05/01/2022 Frequency: Weekly ?Progress:30   Modality: Individual ?Interventions by Therapist:  CBT, problem solving therapy, EMDR, insight oriented.  Patient approved Treatment plan. ? ?Adrean Findlay G Dnasia Gauna, LCSW ? ? ? ? ? ? ? ? ? ? ? ? ? ? ? ? ? ? ? ? ? ? ? ? ? ? ? ? ? ? ? ? ? ? ? ? ?Abed Schar G Deldrick Linch, LCSW ? ? ? ? ? ? ? ? ? ? ? ? ? ? ?Omran Keelin G Lianni Kanaan, LCSW ? ? ? ? ? ? ? ? ? ? ? ? ? ? ?Alwaleed Obeso G Karon Heckendorn, LCSW ? ? ? ? ? ? ? ? ? ? ? ? ? ? ?Dublin Grayer G Magan Winnett, LCSW ? ? ? ? ? ? ? ? ? ? ? ? ? ? ?Aurianna Earlywine G Jaime Dome, LCSW ? ? ? ? ? ? ? ? ? ? ? ? ? ? ?Nishtha Raider G Richell Corker, LCSW ? ? ? ? ? ? ? ? ? ? ? ? ? ? ?Eevie Lapp G Tashiba Timoney,  LCSW ? ? ? ? ? ? ? ? ? ? ? ? ? ? ?Demontae Antunes G Sparsh Callens, LCSW ? ? ? ? ? ? ? ? ? ? ? ? ? ? ?Lonetta Blassingame G Darryl Blumenstein, LCSW ? ? ? ? ? ? ? ? ? ? ? ? ? ? ?Lurdes Haltiwanger G Laural Eiland, LCSW ?

## 2021-07-09 DIAGNOSIS — Z20822 Contact with and (suspected) exposure to covid-19: Secondary | ICD-10-CM | POA: Diagnosis not present

## 2021-07-15 ENCOUNTER — Ambulatory Visit (INDEPENDENT_AMBULATORY_CARE_PROVIDER_SITE_OTHER): Payer: Medicare Other | Admitting: Psychology

## 2021-07-15 DIAGNOSIS — F4310 Post-traumatic stress disorder, unspecified: Secondary | ICD-10-CM | POA: Diagnosis not present

## 2021-07-15 NOTE — Progress Notes (Signed)
[facility] Counselor/Therapist Progress Note ? ?Patient ID: Dawn Simmons, MRN: [id number],   ? ?Date: 07/15/2021 ? ?Time Spent: 60 minutes ? ?Treatment Type: Individual Therapy ? ?Reported Symptoms: anxiety and depression and memory loss ? ?Mental Status Exam: ?Appearance:  Casual     ?Behavior: Appropriate  ?Motor: Normal  ?Speech/Language:  Normal Rate  ?Affect: Blunt  ?Mood: anxious  ?Thought process: normal  ?Thought content:   WNL  ?Sensory/Perceptual disturbances:   WNL  ?Orientation: oriented to person, place, time/date, and situation  ?Attention: Good  ?Concentration: Good  ?Memory: WNL  ?Fund of knowledge:  Good  ?Insight:   Good  ?Judgment:  Good  ?Impulse Control: Good  ? ?Risk Assessment: ?Danger to Self:  No ?Self-injurious Behavior: No ?Danger to Others: No ?Duty to Warn:no ?Physical Aggression / Violence:No  ?Access to Firearms a concern: No  ?Gang Involvement:No  ? ?Subjective: The patient attended a face-to-face individual therapy session in the office today.  The patient presents as pleasant and cooperative.  The patient was driven to the appointment by her daughter and she feels good about herself because she was able to tell her daughter how to get here.  The patient reports that she does miss her husband this week which is a change from her being so angry with him that she does not like him.  We talked about this being a good thing that he is gone this week because it gives her a bit more confidence in herself to be able to handle the situation by herself.  She reports that she did go to her sister's for dinner 1 night and has not done a lot of other things but felt good about driving to her sister's house and taking care of herself this week.  I gave the patient much validation for being much better than she has been in the past. ? ? Interventions: Cognitive Behavioral Therapy and Eye Movement Desensitization and Reprocessing (EMDR) ? ?Diagnosis:PTSD (post-traumatic stress  disorder) ? ?Plan: Treatment Plan ? ?Strengths/Abilities:Insightful, motivated, supportive husband ? ?Treatment Preferences: Outpatient Individual therapy ? ?Statement of Needs: "I have a problem passing out and I was told I have PTSD"   ? ?Symptoms:  Demonstrates an exaggerated startle response.:(Status: improved). Depressed  ?or irritable mood.: (Status: improved). Describes a reliving of the event,  ?particularly through dissociative flashbacks.:  (Status: improved). Displays a  ?significant decline in interest and engagement in activities.: (Status: improved). ?Displays significant psychological and/or physiological distress resulting from internal and external  ?clues that are reminiscent of the traumatic event.: (Status: improved).  ?Experiences disturbances in sleep.: (Status: improved). Experiences disturbing  ?and persistent thoughts, images, and/or perceptions of the traumatic event.: ?(Status: improved). Experiences frequent nightmares.: (Status: improved).  ?Feelings of hopelessness, worthlessness, or inappropriate guilt.: (Status:  ?improved). Has been exposed to a traumatic event involving actual or perceived threat of death or  ?serious injury.: (Status: maintained). Impairment in social, occupational, or  ?other areas of functioning.: (Status: improved). Intentionally avoids activities,  ?places, people, or objects (e.g., up-armored vehicles) that evoke memories of the event.:(Status: maintained). Intentionally avoids thoughts, feelings, or discussions related  ?to the traumatic event.: (Status: improved). Reports difficulty concentrating as  ?well as feelings of guilt.:  (Status: improved). Reports response of intense fear,  ?helplessness, or horror to the traumatic event.:  (Status: improved). ? ?Problems Addressed: PTSD  ? ?Goals:  ?LTG:  1. Develop healthy thinking patterns and beliefs about self, others, and the ?world that lead to the alleviation and  help prevent the relapse of  ?depression.   40% ?Objective ?Identify and replace thoughts and beliefs that support depression.  40 % ?Target Date: 2022-05-01 Frequency: Weekly ?Progress: 40 Modality: individual ?2. Eliminate or reduce the negative impact trauma related symptoms have  ?on social, occupational, and family functioning.  50% ?Objective ?Learn and implement personal skills to manage challenging situations related to trauma. ?Target Date: 2022-05-01 Frequency: Weekly ?Progress: 30 Modality: individual3. No longer avoids persons, places, activities, and objects that are  ?reminiscent of the traumatic event. ?Objective ?3.  Participate in Eye Movement Desensitization and Reprocessing (EMDR) to reduce emotional distress  ?related to traumatic thoughts, feelings, and images. ?Target Date: 2022-05-01 Frequency: Weekly ?Progress: 30 Modality: individual ?4.  Learn and implement guided self-dialogue to manage thoughts, feelings, and urges brought on by  ?encounters with trauma-related situations. ?Target Date: 2022-05-01 Frequency: Weekly ?Progress: 40 Modality: individual ?5.  No longer experiences intrusive event recollections, avoidance of event  ?reminders, intense arousal, or disinterest in activities or  ?relationships. ?Target Date:05/01/2022  Frequency : Weekly ?Progress 30    Modality: individual ?6.Thinks about or openly discusses the traumatic event with others  ?without experiencing psychological or physiological distress. ?Target Date : 05/01/2022 Frequency: Weekly ?Progress:30   Modality: Individual ?Interventions by Therapist:  CBT, problem solving therapy, EMDR, insight oriented.  Patient approved Treatment plan. ? ?Elimelech Houseman G Anterrio Mccleery, LCSW ? ? ? ? ? ? ? ? ? ? ? ? ? ? ? ? ? ? ? ? ? ? ? ? ? ? ? ? ? ? ? ? ? ? ? ? ?Dominiq Fontaine G Aron Needles, LCSW ? ? ? ? ? ? ? ? ? ? ? ? ? ? ?Kamayah Pillay G Jimia Gentles, LCSW ? ? ? ? ? ? ? ? ? ? ? ? ? ? ?Karli Wickizer G Ahlayah Tarkowski, LCSW ? ? ? ? ? ? ? ? ? ? ? ? ? ? ?Tawna Alwin G Aastha Dayley, LCSW ? ? ? ? ? ? ? ? ? ? ? ? ? ? ?Frazier Balfour G Enrique Weiss,  LCSW ? ? ? ? ? ? ? ? ? ? ? ? ? ? ?Jovanni Eckhart G Samiyyah Moffa, LCSW ? ? ? ? ? ? ? ? ? ? ? ? ? ? ?Makhayla Mcmurry G Monita Swier, LCSW ? ? ? ? ? ? ? ? ? ? ? ? ? ? ?Lovena Kluck G Tunisia Landgrebe, LCSW ? ? ? ? ? ? ? ? ? ? ? ? ? ? ?Wymon Swaney G Ayaka Andes, LCSW ? ? ? ? ? ? ? ? ? ? ? ? ? ? ?Unique Searfoss G Jeanenne Licea, LCSW ? ? ? ? ? ? ? ? ? ? ? ? ? ? ?Eupha Lobb G Trampus Mcquerry, LCSW ?

## 2021-07-22 ENCOUNTER — Ambulatory Visit (INDEPENDENT_AMBULATORY_CARE_PROVIDER_SITE_OTHER): Payer: Medicare Other | Admitting: Psychology

## 2021-07-22 DIAGNOSIS — F4310 Post-traumatic stress disorder, unspecified: Secondary | ICD-10-CM

## 2021-07-22 NOTE — Progress Notes (Signed)
[facility] Counselor/Therapist Progress Note ? ?Patient ID: Dawn Simmons, MRN: [id number],   ? ?Date: 07/22/2021 ? ?Time Spent: 60 minutes ? ?Treatment Type: Individual Therapy ? ?Reported Symptoms: anxiety and depression and memory loss ? ?Mental Status Exam: ?Appearance:  Casual     ?Behavior: Appropriate  ?Motor: Normal  ?Speech/Language:  Normal Rate  ?Affect: Blunt  ?Mood: anxious  ?Thought process: normal  ?Thought content:   WNL  ?Sensory/Perceptual disturbances:   WNL  ?Orientation: oriented to person, place, time/date, and situation  ?Attention: Good  ?Concentration: Good  ?Memory: WNL  ?Fund of knowledge:  Good  ?Insight:   Good  ?Judgment:  Good  ?Impulse Control: Good  ? ?Risk Assessment: ?Danger to Self:  No ?Self-injurious Behavior: No ?Danger to Others: No ?Duty to Warn:no ?Physical Aggression / Violence:No  ?Access to Firearms a concern: No  ?Gang Involvement:No  ? ?Subjective: The patient attended a face-to-face individual therapy session in the office today.  The patient presents as pleasant and cooperative.  The patient reports that things went well last week while her husband was gone and she feels like she is doing well.  We talked today about the possibility of going to every other week.  The concern she has is that she does not feel completely well.  The patient also reports that she feels completely safe with me and this may be part of her concern about spreading the visits out.  I explained that I wanted her to get better and that that was the purpose of therapy.  She states that she is going to think about it for the next week and we will again talk about it during our next session. ? ? Interventions: Cognitive Behavioral Therapy and Eye Movement Desensitization and Reprocessing (EMDR) ? ?Diagnosis:PTSD (post-traumatic stress disorder) ? ?Plan: Treatment Plan ? ?Strengths/Abilities:Insightful, motivated, supportive husband ? ?Treatment Preferences: Outpatient Individual  therapy ? ?Statement of Needs: "I have a problem passing out and I was told I have PTSD"   ? ?Symptoms:  Demonstrates an exaggerated startle response.:(Status: improved). Depressed  ?or irritable mood.: (Status: improved). Describes a reliving of the event,  ?particularly through dissociative flashbacks.:  (Status: improved). Displays a  ?significant decline in interest and engagement in activities.: (Status: improved). ?Displays significant psychological and/or physiological distress resulting from internal and external  ?clues that are reminiscent of the traumatic event.: (Status: improved).  ?Experiences disturbances in sleep.: (Status: improved). Experiences disturbing  ?and persistent thoughts, images, and/or perceptions of the traumatic event.: ?(Status: improved). Experiences frequent nightmares.: (Status: improved).  ?Feelings of hopelessness, worthlessness, or inappropriate guilt.: (Status:  ?improved). Has been exposed to a traumatic event involving actual or perceived threat of death or  ?serious injury.: (Status: maintained). Impairment in social, occupational, or  ?other areas of functioning.: (Status: improved). Intentionally avoids activities,  ?places, people, or objects (e.g., up-armored vehicles) that evoke memories of the event.:(Status: maintained). Intentionally avoids thoughts, feelings, or discussions related  ?to the traumatic event.: (Status: improved). Reports difficulty concentrating as  ?well as feelings of guilt.:  (Status: improved). Reports response of intense fear,  ?helplessness, or horror to the traumatic event.:  (Status: improved). ? ?Problems Addressed: PTSD  ? ?Goals:  ?LTG:  1. Develop healthy thinking patterns and beliefs about self, others, and the ?world that lead to the alleviation and help prevent the relapse of  ?depression.  40% ?Objective ?Identify and replace thoughts and beliefs that support depression.  40 % ?Target Date: 2022-05-01 Frequency: Weekly ?Progress: 40  Modality:  individual ?2. Eliminate or reduce the negative impact trauma related symptoms have  ?on social, occupational, and family functioning.  50% ?Objective ?Learn and implement personal skills to manage challenging situations related to trauma. ?Target Date: 2022-05-01 Frequency: Weekly ?Progress: 30 Modality: individual3. No longer avoids persons, places, activities, and objects that are  ?reminiscent of the traumatic event. ?Objective ?3.  Participate in Eye Movement Desensitization and Reprocessing (EMDR) to reduce emotional distress  ?related to traumatic thoughts, feelings, and images. ?Target Date: 2022-05-01 Frequency: Weekly ?Progress: 30 Modality: individual ?4.  Learn and implement guided self-dialogue to manage thoughts, feelings, and urges brought on by  ?encounters with trauma-related situations. ?Target Date: 2022-05-01 Frequency: Weekly ?Progress: 40 Modality: individual ?5.  No longer experiences intrusive event recollections, avoidance of event  ?reminders, intense arousal, or disinterest in activities or  ?relationships. ?Target Date:05/01/2022  Frequency : Weekly ?Progress 30    Modality: individual ?6.Thinks about or openly discusses the traumatic event with others  ?without experiencing psychological or physiological distress. ?Target Date : 05/01/2022 Frequency: Weekly ?Progress:30   Modality: Individual ?Interventions by Therapist:  CBT, problem solving therapy, EMDR, insight oriented.  Patient approved Treatment plan. ? ?Acacia Latorre G Mazi Brailsford, LCSW ? ? ? ? ? ? ? ? ? ? ? ? ? ? ? ? ? ? ? ? ? ? ? ? ? ? ? ? ? ? ? ? ? ? ? ? ?Ceira Hoeschen G Jazel Nimmons, LCSW ? ? ? ? ? ? ? ? ? ? ? ? ? ? ?Javin Nong G Donovon Micheletti, LCSW ? ? ? ? ? ? ? ? ? ? ? ? ? ? ?Jameah Rouser G Gertude Benito, LCSW ? ? ? ? ? ? ? ? ? ? ? ? ? ? ?Kalle Bernath G Other Atienza, LCSW ? ? ? ? ? ? ? ? ? ? ? ? ? ? ?Shaquita Fort G Roniqua Kintz, LCSW ? ? ? ? ? ? ? ? ? ? ? ? ? ? ?Malaki Koury G Mikaylee Arseneau, LCSW ? ? ? ? ? ? ? ? ? ? ? ? ? ? ?Jennifermarie Franzen G Guido Comp, LCSW ? ? ? ? ? ? ? ? ? ? ? ? ? ? ?Mccall Will G Maury Groninger,  LCSW ? ? ? ? ? ? ? ? ? ? ? ? ? ? ?Deveion Denz G Dnaiel Voller, LCSW ? ? ? ? ? ? ? ? ? ? ? ? ? ? ?Aveya Beal G Kymberli Wiegand, LCSW ? ? ? ? ? ? ? ? ? ? ? ? ? ? ?Bryston Colocho G Shawnee Higham, LCSW ? ? ? ? ? ? ? ? ? ? ? ? ? ? ?Allianna Beaubien G Andrea Ferrer, LCSW ?

## 2021-07-24 DIAGNOSIS — Z20822 Contact with and (suspected) exposure to covid-19: Secondary | ICD-10-CM | POA: Diagnosis not present

## 2021-07-26 DIAGNOSIS — Z20822 Contact with and (suspected) exposure to covid-19: Secondary | ICD-10-CM | POA: Diagnosis not present

## 2021-07-29 ENCOUNTER — Ambulatory Visit (INDEPENDENT_AMBULATORY_CARE_PROVIDER_SITE_OTHER): Payer: Medicare Other | Admitting: Psychology

## 2021-07-29 DIAGNOSIS — F4310 Post-traumatic stress disorder, unspecified: Secondary | ICD-10-CM

## 2021-07-30 NOTE — Progress Notes (Signed)
[facility] Counselor/Therapist Progress Note ? ?Patient ID: Dawn Simmons, MRN: [id number],   ? ?Date: 04/20/023 ? ?Time Spent: 60 minutes ? ?Treatment Type: Individual Therapy ? ?Reported Symptoms: anxiety and depression and memory loss ? ?Mental Status Exam: ?Appearance:  Casual     ?Behavior: Appropriate  ?Motor: Normal  ?Speech/Language:  Normal Rate  ?Affect: Blunt  ?Mood: anxious  ?Thought process: normal  ?Thought content:   WNL  ?Sensory/Perceptual disturbances:   WNL  ?Orientation: oriented to person, place, time/date, and situation  ?Attention: Good  ?Concentration: Good  ?Memory: WNL  ?Fund of knowledge:  Good  ?Insight:   Good  ?Judgment:  Good  ?Impulse Control: Good  ? ?Risk Assessment: ?Danger to Self:  No ?Self-injurious Behavior: No ?Danger to Others: No ?Duty to Warn:no ?Physical Aggression / Violence:No  ?Access to Firearms a concern: No  ?Gang Involvement:No  ? ?Subjective: The patient attended a face-to-face individual therapy session in the office today.  The patient presents as pleasant and cooperative.  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  The patient and her husband are now talking about going to [place] for a family reunion which I think would be really good for them.  The patient continues to manage her emotions better and does not seem to be as fearful.  We talked about the possibility of going to every other week but she is not quite ready to do that just yet.  She reports that she put on a nicotine patch to help with long-haul COVID symptoms at the suggestion of someone in her family.  I recommended that she speak with her medication provider about this as nicotine is a drug. ? ? Interventions: Cognitive Behavioral Therapy and Eye Movement Desensitization and Reprocessing (EMDR) ? ?Diagnosis:PTSD (post-traumatic stress disorder) ? ?Plan: Treatment Plan ? ?Strengths/Abilities:Insightful, motivated, supportive husband ? ?Treatment  Preferences: Outpatient Individual therapy ? ?Statement of Needs: "I have a problem passing out and I was told I have PTSD"   ? ?Symptoms:  Demonstrates an exaggerated startle response.:(Status: improved). Depressed  ?or irritable mood.: (Status: improved). Describes a reliving of the event,  ?particularly through dissociative flashbacks.:  (Status: improved). Displays a  ?significant decline in interest and engagement in activities.: (Status: improved). ?Displays significant psychological and/or physiological distress resulting from internal and external  ?clues that are reminiscent of the traumatic event.: (Status: improved).  ?Experiences disturbances in sleep.: (Status: improved). Experiences disturbing  ?and persistent thoughts, images, and/or perceptions of the traumatic event.: ?(Status: improved). Experiences frequent nightmares.: (Status: improved).  ?Feelings of hopelessness, worthlessness, or inappropriate guilt.: (Status:  ?improved). Has been exposed to a traumatic event involving actual or perceived threat of death or  ?serious injury.: (Status: maintained). Impairment in social, occupational, or  ?other areas of functioning.: (Status: improved). Intentionally avoids activities,  ?places, people, or objects (e.g., up-armored vehicles) that evoke memories of the event.:(Status: maintained). Intentionally avoids thoughts, feelings, or discussions related  ?to the traumatic event.: (Status: improved). Reports difficulty concentrating as  ?well as feelings of guilt.:  (Status: improved). Reports response of intense fear,  ?helplessness, or horror to the traumatic event.:  (Status: improved). ? ?Problems Addressed: PTSD  ? ?Goals:  ?LTG:  1. Develop healthy thinking patterns and beliefs about self, others, and the ?world that lead to the alleviation and help prevent the relapse of  ?depression.  40% ?Objective ?Identify and replace thoughts and beliefs that support depression.  40 % ?Target Date: 2022-05-01  Frequency: Weekly ?  Progress: 40 Modality: individual ?2. Eliminate or reduce the negative impact trauma related symptoms have  ?on social, occupational, and family functioning.  50% ?Objective ?Learn and implement personal skills to manage challenging situations related to trauma. ?Target Date: 2022-05-01 Frequency: Weekly ?Progress: 30 Modality: individual3. No longer avoids persons, places, activities, and objects that are  ?reminiscent of the traumatic event. ?Objective ?3.  Participate in Eye Movement Desensitization and Reprocessing (EMDR) to reduce emotional distress  ?related to traumatic thoughts, feelings, and images. ?Target Date: 2022-05-01 Frequency: Weekly ?Progress: 30 Modality: individual ?4.  Learn and implement guided self-dialogue to manage thoughts, feelings, and urges brought on by  ?encounters with trauma-related situations. ?Target Date: 2022-05-01 Frequency: Weekly ?Progress: 40 Modality: individual ?5.  No longer experiences intrusive event recollections, avoidance of event  ?reminders, intense arousal, or disinterest in activities or  ?relationships. ?Target Date:05/01/2022  Frequency : Weekly ?Progress 30    Modality: individual ?6.Thinks about or openly discusses the traumatic event with others  ?without experiencing psychological or physiological distress. ?Target Date : 05/01/2022 Frequency: Weekly ?Progress:30   Modality: Individual ?Interventions by Therapist:  CBT, problem solving therapy, EMDR, insight oriented.  Patient approved Treatment plan. ? ?Sarayah Bacchi G Lasasha Brophy, LCSW ? ? ? ? ? ? ? ? ? ? ? ? ? ? ? ? ? ? ? ? ? ? ? ? ? ? ? ? ? ? ? ? ? ? ? ? ?Evangelos Paulino G Kellan Boehlke, LCSW ? ? ? ? ? ? ? ? ? ? ? ? ? ? ?Zaraya Delauder G Kirtan Sada, LCSW ? ? ? ? ? ? ? ? ? ? ? ? ? ? ?Laykin Rainone G Bentley Haralson, LCSW ? ? ? ? ? ? ? ? ? ? ? ? ? ? ?Waldon Sheerin G Nakeeta Sebastiani, LCSW ? ? ? ? ? ? ? ? ? ? ? ? ? ? ?Sophea Rackham G Burdette Gergely, LCSW ? ? ? ? ? ? ? ? ? ? ? ? ? ? ?Aldona Bryner G Nashae Maudlin, LCSW ? ? ? ? ? ? ? ? ? ? ? ? ? ? ?Tommy Minichiello G Marlyn Tondreau,  LCSW ? ? ? ? ? ? ? ? ? ? ? ? ? ? ?Shardae Kleinman G Jakera Beaupre, LCSW ? ? ? ? ? ? ? ? ? ? ? ? ? ? ?Kaydi Kley G Tranice Laduke, LCSW ? ? ? ? ? ? ? ? ? ? ? ? ? ? ?Elexus Barman G Cathyann Kilfoyle, LCSW ? ? ? ? ? ? ? ? ? ? ? ? ? ? ?Nidal Rivet G Phyllis Abelson, LCSW ? ? ? ? ? ? ? ? ? ? ? ? ? ? ?Toribio Seiber G Halina Asano, LCSW ? ? ? ? ? ? ? ? ? ? ? ? ? ? ?Kaaliyah Kita G Alexsus Papadopoulos, LCSW ?

## 2021-08-02 ENCOUNTER — Ambulatory Visit (INDEPENDENT_AMBULATORY_CARE_PROVIDER_SITE_OTHER): Payer: Medicare Other | Admitting: Family Medicine

## 2021-08-02 ENCOUNTER — Ambulatory Visit: Payer: Medicare Other | Admitting: Family Medicine

## 2021-08-02 ENCOUNTER — Encounter: Payer: Self-pay | Admitting: Family Medicine

## 2021-08-02 VITALS — BP 111/72 | HR 88 | Ht 66.0 in | Wt 124.6 lb

## 2021-08-02 DIAGNOSIS — F4310 Post-traumatic stress disorder, unspecified: Secondary | ICD-10-CM

## 2021-08-02 DIAGNOSIS — U099 Post covid-19 condition, unspecified: Secondary | ICD-10-CM | POA: Diagnosis not present

## 2021-08-02 NOTE — Progress Notes (Signed)
? ?Established Patient Office Visit ? ?Subjective   ?Patient ID: Dawn Simmons, female    DOB: [birth year]  Age: 76 y.o. MRN: [id number] ? ?CC: 78-month follow-up, PTSD (accompanied by husband today) ? ? ?HPI ? ? ?Patient last saw PCP on 03/29/21 for 82-month follow-up. At that time she was reporting progress with PTSD, memory recall, reducing diazepam to 2.5 mg at bedtime and sleeping better and continuing with Prozac 20 mg daily.  She was doing talk counseling every week, but not finding it helpful at the time. She got more progress with hands-on treatment therapy instead of talk therapy and was hoping to move forward with that.  ? ? ?Today she reports she is feeling alright, better than before. She is able to get outside in the yard, eating well. No new symptoms. She is hoping to start weaning down the Prozac with psych provider. She is still doing 2.5 mg diazepam at night. No SI/HI.  ? ?Husband did some research and found a study in [place] that showed nicotine as a cure for long-COVID (reports her symptoms started in November 2019 after attending a wedding with a lot of international guests). She has been using partial nicotine patches (21 mg patch cut into 9 pieces) for the past week. Reports that since then her memory has improved, tremors have significantly improved, weight has stabilized, etc. She is really hoping to get her sense of taste back soon. Reports daughter and grandson had loss of smell and altered tastes (they tried the 7.5 mg nicotine patches for 3 days - and all symptoms resolved).  ? ? ? ? ? ? ?ROS ?All review of systems negative except what is listed in the HPI ? ?  ?Objective:  ?  ? ?BP 111/72   Pulse 88   Ht 5\' 6"  (1.676 m)   Wt 124 lb 9.6 oz (56.5 kg)   BMI 20.11 kg/m?  ? ? ?Physical Exam ?Vitals reviewed.  ?Constitutional:   ?   Appearance: Normal appearance. She is normal weight.  ?HENT:  ?   Head: Normocephalic and atraumatic.  ?Cardiovascular:  ?   Rate and Rhythm: Normal rate and  regular rhythm.  ?Pulmonary:  ?   Effort: Pulmonary effort is normal.  ?   Breath sounds: Normal breath sounds.  ?Musculoskeletal:  ?   Comments: Very fine tremor noted to bilateral hands  ?Skin: ?   General: Skin is warm and dry.  ?Neurological:  ?   General: No focal deficit present.  ?   Mental Status: She is alert and oriented to person, place, and time. Mental status is at baseline.  ?Psychiatric:     ?   Mood and Affect: Mood normal.     ?   Behavior: Behavior normal.     ?   Thought Content: Thought content normal.     ?   Judgment: Judgment normal.  ? ? ? ?No results found for any visits on 08/02/21. ? ? ? ?The ASCVD Risk score (Arnett DK, et al., 2019) failed to calculate for the following reasons: ?  Cannot find a previous HDL lab ?  Cannot find a previous total cholesterol lab ?  Unable to determine if patient is Non-Hispanic African American ? ?  ?Assessment & Plan:  ? 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 She is wanting to continue trial of nicotine patches. She is aware of adverse symptoms to monitor for.  ? ?2. PTSD (post-traumatic stress disorder) ?Doing well, following with psych. Stable.  No SI/HI. No changes today.  ? ?Routine labs were stable 6 months ago, she prefers to wait until next follow-up appointment to recheck blood work. No new concerns today. Doing well overall.  ? ? ?Return in about 4 months (around 12/02/2021) for routine PCP f/u .  ? ? ?Terrilyn Saver, NP ? ?

## 2021-08-02 NOTE — Patient Instructions (Addendum)
Glad you are doing well! ?No changes to plan today - continue healthy diet and exercise as tolerated.  ?Please let us know if you need anything! ?

## 2021-08-03 DIAGNOSIS — Z20822 Contact with and (suspected) exposure to covid-19: Secondary | ICD-10-CM | POA: Diagnosis not present

## 2021-08-05 ENCOUNTER — Ambulatory Visit (INDEPENDENT_AMBULATORY_CARE_PROVIDER_SITE_OTHER): Payer: Medicare Other | Admitting: Psychology

## 2021-08-05 DIAGNOSIS — F4310 Post-traumatic stress disorder, unspecified: Secondary | ICD-10-CM

## 2021-08-05 NOTE — Progress Notes (Signed)
[facility] Counselor/Therapist Progress Note ? ?Patient ID: Zali Kamaka, MRN: [id number],   ? ?Date: 08/05/2021 ? ?Time Spent: 60 minutes ? ?Treatment Type: Individual Therapy ? ?Reported Symptoms: anxiety and depression and memory loss ? ?Mental Status Exam: ?Appearance:  Casual     ?Behavior: Appropriate  ?Motor: Normal  ?Speech/Language:  Normal Rate  ?Affect: Blunt  ?Mood: pleasant  ?Thought process: normal  ?Thought content:   WNL  ?Sensory/Perceptual disturbances:   WNL  ?Orientation: oriented to person, place, time/date, and situation  ?Attention: Good  ?Concentration: Good  ?Memory: WNL  ?Fund of knowledge:  Good  ?Insight:   Good  ?Judgment:  Good  ?Impulse Control: Good  ? ?Risk Assessment: ?Danger to Self:  No ?Self-injurious Behavior: No ?Danger to Others: No ?Duty to Warn:no ?Physical Aggression / Violence:No  ?Access to Firearms a concern: No  ?Gang Involvement:No  ? ?Subjective: The patient attended a face-to-face individual therapy session in the office today.  The patient presents as pleasant and cooperative.  The patient reports that she feels like she is doing very well.  The patient continues to think that the nicotine patches are a miracle drug.  I recommended she speak with her medication providers just to make sure that this is safe for her to continue.  She feels that it is helped her tremendously with her tremors.  We decided to try 2 weeks without sessions and will do a virtual visit on May 11 and then make a decision about whether we want to go to every 2 weeks moving forward.  The patient continues to remain stable and is not reactive and does not need EMDR at present. ? ? Interventions: Cognitive Behavioral Therapy and Eye Movement Desensitization and Reprocessing (EMDR) ? ?Diagnosis:PTSD (post-traumatic stress disorder) ? ?Plan: Treatment Plan ? ?Strengths/Abilities:Insightful, motivated, supportive husband ? ?Treatment Preferences: Outpatient Individual  therapy ? ?Statement of Needs: "I have a problem passing out and I was told I have PTSD"   ? ?Symptoms:  Demonstrates an exaggerated startle response.:(Status: improved). Depressed  ?or irritable mood.: (Status: improved). Describes a reliving of the event,  ?particularly through dissociative flashbacks.:  (Status: improved). Displays a  ?significant decline in interest and engagement in activities.: (Status: improved). ?Displays significant psychological and/or physiological distress resulting from internal and external  ?clues that are reminiscent of the traumatic event.: (Status: improved).  ?Experiences disturbances in sleep.: (Status: improved). Experiences disturbing  ?and persistent thoughts, images, and/or perceptions of the traumatic event.: ?(Status: improved). Experiences frequent nightmares.: (Status: improved).  ?Feelings of hopelessness, worthlessness, or inappropriate guilt.: (Status:  ?improved). Has been exposed to a traumatic event involving actual or perceived threat of death or  ?serious injury.: (Status: maintained). Impairment in social, occupational, or  ?other areas of functioning.: (Status: improved). Intentionally avoids activities,  ?places, people, or objects (e.g., up-armored vehicles) that evoke memories of the event.:(Status: maintained). Intentionally avoids thoughts, feelings, or discussions related  ?to the traumatic event.: (Status: improved). Reports difficulty concentrating as  ?well as feelings of guilt.:  (Status: improved). Reports response of intense fear,  ?helplessness, or horror to the traumatic event.:  (Status: improved). ? ?Problems Addressed: PTSD  ? ?Goals:  ?LTG:  1. Develop healthy thinking patterns and beliefs about self, others, and the ?world that lead to the alleviation and help prevent the relapse of  ?depression.  40% ?Objective ?Identify and replace thoughts and beliefs that support depression.  40 % ?Target Date: 2022-05-01 Frequency: Weekly ?Progress: 40  Modality: individual ?2. Eliminate or reduce  the negative impact trauma related symptoms have  ?on social, occupational, and family functioning.  50% ?Objective ?Learn and implement personal skills to manage challenging situations related to trauma. ?Target Date: 2022-05-01 Frequency: Weekly ?Progress: 30 Modality: individual3. No longer avoids persons, places, activities, and objects that are  ?reminiscent of the traumatic event. ?Objective ?3.  Participate in Eye Movement Desensitization and Reprocessing (EMDR) to reduce emotional distress  ?related to traumatic thoughts, feelings, and images. ?Target Date: 2022-05-01 Frequency: Weekly ?Progress: 30 Modality: individual ?4.  Learn and implement guided self-dialogue to manage thoughts, feelings, and urges brought on by  ?encounters with trauma-related situations. ?Target Date: 2022-05-01 Frequency: Weekly ?Progress: 40 Modality: individual ?5.  No longer experiences intrusive event recollections, avoidance of event  ?reminders, intense arousal, or disinterest in activities or  ?relationships. ?Target Date:05/01/2022  Frequency : Weekly ?Progress 30    Modality: individual ?6.Thinks about or openly discusses the traumatic event with others  ?without experiencing psychological or physiological distress. ?Target Date : 05/01/2022 Frequency: Weekly ?Progress:30   Modality: Individual ?Interventions by Therapist:  CBT, problem solving therapy, EMDR, insight oriented.  Patient approved Treatment plan. ? ?Greycen Felter G Attila Mccarthy, LCSW ? ? ? ? ? ? ? ? ? ? ? ? ? ? ? ? ? ? ? ? ? ? ? ? ? ? ? ? ? ? ? ? ? ? ? ? ?Kester Stimpson G Nikiyah Fackler, LCSW ? ? ? ? ? ? ? ? ? ? ? ? ? ? ?Bridey Brookover G Mylie Mccurley, LCSW ? ? ? ? ? ? ? ? ? ? ? ? ? ? ?Sankalp Ferrell G Val Farnam, LCSW ? ? ? ? ? ? ? ? ? ? ? ? ? ? ?Marguerite Barba G Lailie Smead, LCSW ? ? ? ? ? ? ? ? ? ? ? ? ? ? ?Briani Maul G Lidie Glade, LCSW ? ? ? ? ? ? ? ? ? ? ? ? ? ? ?Eduin Friedel G Ramez Arrona, LCSW ? ? ? ? ? ? ? ? ? ? ? ? ? ? ?Jessee Newnam G Ala Kratz, LCSW ? ? ? ? ? ? ? ? ? ? ? ? ? ? ?Nakaiya Beddow G Armany Mano,  LCSW ? ? ? ? ? ? ? ? ? ? ? ? ? ? ?Trampas Stettner G Lott Seelbach, LCSW ? ? ? ? ? ? ? ? ? ? ? ? ? ? ?Stanley Lyness G Tamyia Minich, LCSW ? ? ? ? ? ? ? ? ? ? ? ? ? ? ?Davontay Watlington G Laythan Hayter, LCSW ? ? ? ? ? ? ? ? ? ? ? ? ? ? ?Cleopha Indelicato G Bryant Saye, LCSW ? ? ? ? ? ? ? ? ? ? ? ? ? ? ?Mikki Ziff G Bralyn Folkert, LCSW ? ? ? ? ? ? ? ? ? ? ? ? ? ? ?Mel Tadros G Caliann Leckrone, LCSW ?

## 2021-08-09 ENCOUNTER — Telehealth (INDEPENDENT_AMBULATORY_CARE_PROVIDER_SITE_OTHER): Payer: Medicare Other | Admitting: Family Medicine

## 2021-08-09 ENCOUNTER — Encounter: Payer: Self-pay | Admitting: Family Medicine

## 2021-08-09 DIAGNOSIS — M545 Low back pain, unspecified: Secondary | ICD-10-CM

## 2021-08-09 MED ORDER — PREDNISONE 20 MG PO TABS: 40.0000 mg | ORAL_TABLET | Freq: Every day | ORAL | 0 refills | Status: AC

## 2021-08-09 NOTE — Progress Notes (Signed)
Low back pain ?Did some gardening over the weekend ?Tried using icy hot, heating pad ?Ibuprofen  ?

## 2021-08-09 NOTE — Patient Instructions (Addendum)
Prednisone for 5 days ?After the prednisone, Aleve twice daily. ?Tylenol is fine to take at the same time.  ?Heating pad, home stretches, consider PT.  ? ? ? ?Please contact office for follow-up if symptoms do not improve or worsen. Seek emergency care if symptoms become severe. ? ? ? ?

## 2021-08-09 NOTE — Progress Notes (Signed)
Virtual Visit via Telephone Note ? ?I connected with  Dawn Simmons on 08/09/21 at  2:40 PM EDT by telephone and verified that I am speaking with the correct person using two identifiers. ?  ?I discussed the limitations, risks, security and privacy concerns of performing an evaluation and management service by telephone and the availability of in person appointments. I also discussed with the patient that there may be a patient responsible charge related to this service. The patient expressed understanding and agreed to proceed. ? ?Participating parties included in this telephone visit include: The patient and the nurse practitioner listed. Husband on speaker phone for visit.  ?The patient is: At home ?I am: at [facility] at [facility]  ? ?Subjective:   ? ?CC: low back pain  ? ?HPI: ?Dawn Simmons is a 76 y.o. year old female presenting today via telephone visit to discuss back pain. ? ?Back Pain ?This is a new problem. The current episode started yesterday (after gardening). The problem occurs constantly. The problem has been gradually worsening since onset. The pain is present in the lumbar spine (bilateral). The quality of the pain is described as stabbing and aching. The pain does not radiate. Pain scale: severe 10/10 with activity; at best 6/10. The symptoms are aggravated by standing, twisting and bending. Pertinent negatives include no abdominal pain, bladder incontinence, bowel incontinence, dysuria, fever, headaches, leg pain, numbness, paresis, paresthesias, perianal numbness or tingling. Treatments tried: diazepam. The treatment provided no relief.  ? ? ? ?Past medical history, Surgical history, Family history not pertinant except as noted below, Social history, Allergies, and medications have been entered into the medical record, reviewed, and corrections made.  ? ?Review of Systems:  ?All review of systems negative except what is listed in the HPI ? ?Objective:   ? ?General:   ?Patient speaking clearly in complete sentences. ?No shortness of breath noted.   ?Alert and oriented x3.   ?Normal judgment.  ?No apparent acute distress. ? ?Impression and Recommendations:   ? ?1. Acute bilateral low back pain without sciatica ?Prednisone for 5 days - medication discussed and education provided  ?After the prednisone, Aleve twice daily. ?Tylenol is fine to take at the same time.  ?Heating pad, home stretches, consider PT.  ?(She is hesitant to try a lot of new medications - will hold off on meloxicam and muscle relaxers at this time, but consider use if not improving.)  ?Patient aware of signs/symptoms requiring further/urgent evaluation.  ? ?- predniSONE (DELTASONE) 20 MG tablet; Take 2 tablets (40 mg total) by mouth daily with breakfast for 5 days.  Dispense: 10 tablet; Refill: 0 ? ? ?Please contact office for follow-up if symptoms do not improve or worsen. Seek emergency care if symptoms become severe. ? ?  ?I discussed the assessment and treatment plan with the patient. The patient was provided an opportunity to ask questions and all were answered. The patient agreed with the plan and demonstrated an understanding of the instructions. ?  ?The patient was advised to call back or seek an in-person evaluation if the symptoms worsen or if the condition fails to improve as anticipated. ? ?I provided 20 minutes of non-face-to-face time during this TELEPHONE encounter.  ? ? ?Clayborne Dana, NP ? ?

## 2021-08-10 DIAGNOSIS — Z20822 Contact with and (suspected) exposure to covid-19: Secondary | ICD-10-CM | POA: Diagnosis not present

## 2021-08-12 ENCOUNTER — Ambulatory Visit: Payer: Medicare Other | Admitting: Psychology

## 2021-08-12 DIAGNOSIS — Z20822 Contact with and (suspected) exposure to covid-19: Secondary | ICD-10-CM | POA: Diagnosis not present

## 2021-08-13 ENCOUNTER — Telehealth: Payer: Self-pay | Admitting: Family Medicine

## 2021-08-13 NOTE — Telephone Encounter (Signed)
Patient would like someone to contact her back regarding back pain  ? ?Patient states back pain is much better but would like advice on next steps  ? ?Please advise  ?

## 2021-08-16 DIAGNOSIS — Z20822 Contact with and (suspected) exposure to covid-19: Secondary | ICD-10-CM | POA: Diagnosis not present

## 2021-08-16 NOTE — Telephone Encounter (Signed)
Pt called stating that the pain is better at some times and worse others. Pt is wondering what course of action to take in order to manage the pain. Please Advise. ?

## 2021-08-19 ENCOUNTER — Ambulatory Visit (INDEPENDENT_AMBULATORY_CARE_PROVIDER_SITE_OTHER): Payer: Medicare Other | Admitting: Psychology

## 2021-08-19 ENCOUNTER — Ambulatory Visit (INDEPENDENT_AMBULATORY_CARE_PROVIDER_SITE_OTHER): Payer: Medicare Other | Admitting: Family Medicine

## 2021-08-19 ENCOUNTER — Encounter: Payer: Self-pay | Admitting: Family Medicine

## 2021-08-19 VITALS — BP 129/75 | HR 89 | Ht 66.0 in | Wt 124.6 lb

## 2021-08-19 DIAGNOSIS — M5441 Lumbago with sciatica, right side: Secondary | ICD-10-CM

## 2021-08-19 DIAGNOSIS — F4310 Post-traumatic stress disorder, unspecified: Secondary | ICD-10-CM | POA: Diagnosis not present

## 2021-08-19 MED ORDER — TIZANIDINE HCL 4 MG PO TABS: 4.0000 mg | ORAL_TABLET | Freq: Four times a day (QID) | ORAL | 0 refills | Status: DC | PRN

## 2021-08-19 MED ORDER — MELOXICAM 15 MG PO TABS: 15.0000 mg | ORAL_TABLET | Freq: Every day | ORAL | 0 refills | Status: DC

## 2021-08-19 NOTE — Telephone Encounter (Signed)
Pt scheduled  

## 2021-08-19 NOTE — Patient Instructions (Signed)
Low back pain: ?Start meloxicam - take with food (do not take with any other antiinflammatories like ibuprofen, advil, aleve) ?Use the muscle relaxer (tizanidine) as needed, caution this may make you drowsy ?Continue heating pad as needed a few times per day ?Home exercises - handout provided ?Physical therapy referral placed - they will be calling you to schedule  ?

## 2021-08-19 NOTE — Progress Notes (Signed)
? ?  Acute Office Visit ? ?Subjective:  ? ?  ?Patient ID: Dawn Simmons, female    DOB: [birth year], 76 y.o.   MRN: [id number] ? ?CC: back pain follow-up  ? ?HPI ?Patient is in today for back pain follow-up.  ? ?Patient was seen via telephone visit on 08/09/21 for acute low back pain after working in her garden. She did  5 days of prednisone and started feeling much better for awhile. Shortly after stopping the medication, she started feeling worse again. Reports she has used some occasional tylenol and advil but didn't feel like it helped much and may have irritated her stomach. Heating pad has helped a lot. She has been resting a lot and trying to avoid much physical activity, but she has to go upsatirs to get to the bedroom at least once or twice a day. She wants to feel better so she can get out in the beuatiful weather and work in her garden.  ?Pain is now primarily right lower back (points to SI joint) with radiation into right leg. She continues to deny any numbness, asymmetrical weakness, saddle paresthesia, incontinence, fevers, confusion, etc.  ? ?ROS ?All review of systems negative except what is listed in the HPI ? ? ?   ?Objective:  ?  ?BP 129/75   Pulse 89   Ht 5\' 6"  (1.676 m)   Wt 124 lb 9.6 oz (56.5 kg)   BMI 20.11 kg/m?  ? ? ?Physical Exam ?Vitals reviewed.  ?Constitutional:   ?   Appearance: Normal appearance. She is normal weight.  ?Musculoskeletal:  ?   Comments: Negative straight leg raise, full ROM but slow d/t discomfort; right lower back with palpable muscle tension and significant tenderness of SI joint palpation  ?Neurological:  ?   General: No focal deficit present.  ?   Mental Status: She is alert and oriented to person, place, and time. Mental status is at baseline.  ?Psychiatric:     ?   Mood and Affect: Mood normal.     ?   Behavior: Behavior normal.     ?   Thought Content: Thought content normal.     ?   Judgment: Judgment normal.  ? ? ?No results found for any visits on  08/19/21. ? ? ?   ?Assessment & Plan:  ? ?1. Acute right-sided low back pain with right-sided sciatica ?Start meloxicam - take with food (do not take with any other antiinflammatories like ibuprofen, advil, aleve) - education provided; states she would like to try, but know to stop if stomach irritation develops. ?Use the muscle relaxer (tizanidine) as needed, caution this may make you drowsy - education provided  ?Continue heating pad as needed a few times per day ?Home exercises - handout provided ?Physical therapy referral placed - they will be calling you to schedule  ?Patient aware of signs/symptoms requiring further/urgent evaluation.  ? ?- meloxicam (MOBIC) 15 MG tablet; Take 1 tablet (15 mg total) by mouth daily.  Dispense: 30 tablet; Refill: 0 ?- Ambulatory referral to Physical Therapy ?- tiZANidine (ZANAFLEX) 4 MG tablet; Take 1 tablet (4 mg total) by mouth every 6 (six) hours as needed for muscle spasms.  Dispense: 30 tablet; Refill: 0 ? ? ?Return if symptoms worsen or fail to improve, for 4 weeks if not improving (can do xrays at that time if needed). ? ?10/19/21, NP ? ? ?

## 2021-08-20 ENCOUNTER — Telehealth: Payer: Self-pay | Admitting: Family Medicine

## 2021-08-20 NOTE — Progress Notes (Signed)
[facility] Counselor/Therapist Progress Note ? ?Patient ID: Dawn Simmons, MRN: [id number],   ? ?Date: 08/19/2021 ? ?Time Spent: 60 minutes ? ?Treatment Type: Individual Therapy ? ?Reported Symptoms: anxiety and depression and memory loss ? ?Mental Status Exam: ?Appearance:  Casual     ?Behavior: Appropriate  ?Motor: Normal  ?Speech/Language:  Normal Rate  ?Affect: Blunt  ?Mood: pleasant  ?Thought process: normal  ?Thought content:   WNL  ?Sensory/Perceptual disturbances:   WNL  ?Orientation: oriented to person, place, time/date, and situation  ?Attention: Good  ?Concentration: Good  ?Memory: WNL  ?Fund of knowledge:  Good  ?Insight:   Good  ?Judgment:  Good  ?Impulse Control: Good  ? ?Risk Assessment: ?Danger to Self:  No ?Self-injurious Behavior: No ?Danger to Others: No ?Duty to Warn:no ?Physical Aggression / Violence:No  ?Access to Firearms a concern: No  ?Gang Involvement:No  ? ?Subjective: The patient attended a face-to-face individual therapy session in the office today.  The patient presents as pleasant and cooperative.  The patient states that she hurt her back in the last few weeks.  The patient was walking very gingerly to the office.  The patient states that she feels like she is doing well emotionally.  She was upset that she had hurt her back however she has been managing things relatively well.  The patient states that she has an appointment with the doctor this afternoon.  We will continue to monitor the patient's mood and make a decision in the coming weeks about whether she would stretch her appointments out. ? ? Interventions: Cognitive Behavioral Therapy and Eye Movement Desensitization and Reprocessing (EMDR) ? ?Diagnosis:PTSD (post-traumatic stress disorder) ? ?Plan: Treatment Plan ? ?Strengths/Abilities:Insightful, motivated, supportive husband ? ?Treatment Preferences: Outpatient Individual therapy ? ?Statement of Needs: "I have a problem passing out and I was told I have  PTSD"   ? ?Symptoms:  Demonstrates an exaggerated startle response.:(Status: improved). Depressed  ?or irritable mood.: (Status: improved). Describes a reliving of the event,  ?particularly through dissociative flashbacks.:  (Status: improved). Displays a  ?significant decline in interest and engagement in activities.: (Status: improved). ?Displays significant psychological and/or physiological distress resulting from internal and external  ?clues that are reminiscent of the traumatic event.: (Status: improved).  ?Experiences disturbances in sleep.: (Status: improved). Experiences disturbing  ?and persistent thoughts, images, and/or perceptions of the traumatic event.: ?(Status: improved). Experiences frequent nightmares.: (Status: improved).  ?Feelings of hopelessness, worthlessness, or inappropriate guilt.: (Status:  ?improved). Has been exposed to a traumatic event involving actual or perceived threat of death or  ?serious injury.: (Status: maintained). Impairment in social, occupational, or  ?other areas of functioning.: (Status: improved). Intentionally avoids activities,  ?places, people, or objects (e.g., up-armored vehicles) that evoke memories of the event.:(Status: maintained). Intentionally avoids thoughts, feelings, or discussions related  ?to the traumatic event.: (Status: improved). Reports difficulty concentrating as  ?well as feelings of guilt.:  (Status: improved). Reports response of intense fear,  ?helplessness, or horror to the traumatic event.:  (Status: improved). ? ?Problems Addressed: PTSD  ? ?Goals:  ?LTG:  1. Develop healthy thinking patterns and beliefs about self, others, and the ?world that lead to the alleviation and help prevent the relapse of  ?depression.  40% ?Objective ?Identify and replace thoughts and beliefs that support depression.  40 % ?Target Date: 2022-05-01 Frequency: Weekly ?Progress: 40 Modality: individual ?2. Eliminate or reduce the negative impact trauma related  symptoms have  ?on social, occupational, and family functioning.  50% ?Objective ?Learn and  implement personal skills to manage challenging situations related to trauma. ?Target Date: 2022-05-01 Frequency: Weekly ?Progress: 30 Modality: individual3. No longer avoids persons, places, activities, and objects that are  ?reminiscent of the traumatic event. ?Objective ?3.  Participate in Eye Movement Desensitization and Reprocessing (EMDR) to reduce emotional distress  ?related to traumatic thoughts, feelings, and images. ?Target Date: 2022-05-01 Frequency: Weekly ?Progress: 30 Modality: individual ?4.  Learn and implement guided self-dialogue to manage thoughts, feelings, and urges brought on by  ?encounters with trauma-related situations. ?Target Date: 2022-05-01 Frequency: Weekly ?Progress: 40 Modality: individual ?5.  No longer experiences intrusive event recollections, avoidance of event  ?reminders, intense arousal, or disinterest in activities or  ?relationships. ?Target Date:05/01/2022  Frequency : Weekly ?Progress 30    Modality: individual ?6.Thinks about or openly discusses the traumatic event with others  ?without experiencing psychological or physiological distress. ?Target Date : 05/01/2022 Frequency: Weekly ?Progress:30   Modality: Individual ?Interventions by Therapist:  CBT, problem solving therapy, EMDR, insight oriented.  Patient approved Treatment plan. ? ?Tangelia Sanson G Kern Gingras, LCSW ? ? ? ? ? ? ? ? ? ? ? ? ? ? ? ? ? ? ? ? ? ? ? ? ? ? ? ? ? ? ? ? ? ? ? ? ?Tami Barren G Giavana Rooke, LCSW ? ? ? ? ? ? ? ? ? ? ? ? ? ? ?Caitriona Sundquist G Shavar Gorka, LCSW ? ? ? ? ? ? ? ? ? ? ? ? ? ? ?Loyalty Brashier G Nayvie Lips, LCSW ? ? ? ? ? ? ? ? ? ? ? ? ? ? ?Tiffiney Sparrow G Tinsley Everman, LCSW ? ? ? ? ? ? ? ? ? ? ? ? ? ? ?Daisee Centner G Caileigh Canche, LCSW ? ? ? ? ? ? ? ? ? ? ? ? ? ? ?Sophiea Ueda G Xxavier Noon, LCSW ? ? ? ? ? ? ? ? ? ? ? ? ? ? ?Lametria Klunk G Elyjah Hazan, LCSW ? ? ? ? ? ? ? ? ? ? ? ? ? ? ?Willys Salvino G Norton Bivins, LCSW ? ? ? ? ? ? ? ? ? ? ? ? ? ? ?Rafeal Skibicki G Asja Frommer, LCSW ? ? ? ? ? ? ? ? ? ? ? ? ? ? ?Karisha Marlin G  Veroncia Jezek, LCSW ? ? ? ? ? ? ? ? ? ? ? ? ? ? ?Silvestre Mines G Nieves Barberi, LCSW ? ? ? ? ? ? ? ? ? ? ? ? ? ? ?Patti Shorb G Dulcemaria Bula, LCSW ? ? ? ? ? ? ? ? ? ? ? ? ? ? ?Hung Rhinesmith G Norrine Ballester, LCSW ? ? ? ? ? ? ? ? ? ? ? ? ? ? ?Florentino Laabs G Masen Luallen, LCSW ? ? ? ? ? ? ? ? ? ? ? ? ? ? ?Yakir Wenke G Stevey Stapleton, LCSW ?

## 2021-08-20 NOTE — Telephone Encounter (Signed)
Please advise once you have a chance.  ?

## 2021-08-20 NOTE — Telephone Encounter (Signed)
Pt called back and says she is using heat and ice and it is helping some. The appointment she has pending already is the soonest that you have on your schedule. She would like to set up a telephone visit a little sooner if possible. Do you have any places that I might could place her?  ?

## 2021-08-20 NOTE — Telephone Encounter (Signed)
Pt called stating she would like to talk to Dr. Abner Greenspan about seeking other courses of treatment other than adding more medication for her back pain. She states that she doesn't sit well with throwing medication at the problem. ? ?Pt would also like to discuss managing nightmares relating to PTSD as she stated she has had some of the worst ones recently.  ? ?Pt states that she would rather address these issues with Dr. Abner Greenspan only as she feels she is the only one who understands her needs. She understands that Dr. Abner Greenspan is very busy with personal matters and being heavily booked but she would really appreciate her insight. ?

## 2021-08-23 NOTE — Telephone Encounter (Signed)
Provider had cancellation so was able to schedule pt for tomorrow 5/16. ?

## 2021-08-23 NOTE — Progress Notes (Deleted)
Subjective:    Patient ID: Dawn Simmons, female    DOB: Jan 15, 1946, 76 y.o.   MRN: [id number]  No chief complaint on file.   HPI Patient is in today for back pain, PTSD, depression.   Past Medical History:  Diagnosis Date   H/O fibromyalgia    H/O insomnia    H/O measles    H/O rubella    H/O syncope    History of chicken pox 09/15/2019   History of palpitations    Hx of mumps    Mitral valve prolapse    Palpitations    Syncope    Von Willebrand disease (HCC)     Past Surgical History:  Procedure Laterality Date   NO PAST SURGERIES      Family History  Problem Relation Age of Onset   Stroke Mother    Emphysema Father        smoked   Alcohol abuse Father    Heart attack Maternal Grandfather    Heart attack Paternal Grandfather    Heart attack Maternal Uncle    Heart failure Maternal Grandmother    Heart failure Paternal Grandmother    OCD Son    Drug abuse Son    Seizures Neg Hx     Social History   Socioeconomic History   Marital status: Married    Spouse name: tony   Number of children: 4   Years of education: 12   Highest education level: Not on file  Occupational History   Not on file  Tobacco Use   Smoking status: Former    Packs/day: 0.50    Years: 10.00    Pack years: 5.00    Types: Cigarettes    Quit date: 04/27/1972    Years since quitting: 49.3   Smokeless tobacco: Never  Vaping Use   Vaping Use: Never used  Substance and Sexual Activity   Alcohol use: Yes    Alcohol/week: 0.0 standard drinks    Comment: occ 1 glass wine or beer   Drug use: No   Sexual activity: Not on file  Other Topics Concern   Not on file  Social History Narrative   Lives at home with husband   Caffeine use- tea, 1 cup/day   Social Determinants of Health   Financial Resource Strain: Not on file  Food Insecurity: Not on file  Transportation Needs: No Transportation Needs   Lack of Transportation (Medical): No   Lack of Transportation (Non-Medical): No   Physical Activity: Inactive   Days of Exercise per Week: 0 days   Minutes of Exercise per Session: 0 min  Stress: Not on file  Social Connections: Not on file  Intimate Partner Violence: Not At Risk   Fear of Current or Ex-Partner: No   Emotionally Abused: No   Physically Abused: No   Sexually Abused: No    Outpatient Medications Prior to Visit  Medication Sig Dispense Refill   Ascorbic Acid (VITAMIN C) 100 MG tablet Take 100 mg by mouth daily.     Calcium-Magnesium 200-100 MG TABS Take 4 tablets by mouth daily. 2 tablets in the morning and 2 tablets in the evening     diazepam (VALIUM) 5 MG tablet Take 1 tablet (5 mg total) by mouth at bedtime. Take 1/2-1 tablet po QHS 90 tablet 0   ergocalciferol (DRISDOL) 200 MCG/ML drops Take by mouth daily. PURE brand     FLUoxetine (PROZAC) 10 MG capsule Take 2 capsules (20 mg total) by mouth every  morning. 180 capsule 0   loratadine (CLARITIN) 10 MG tablet Take 10 mg by mouth daily.     MAGNESIUM PO Take 200 mg by mouth at bedtime.     meloxicam (MOBIC) 15 MG tablet Take 1 tablet (15 mg total) by mouth daily. 30 tablet 0   Multiple Vitamin (MULTIVITAMIN) capsule Take 1 capsule by mouth daily.     nystatin (MYCOSTATIN) 100000 UNIT/ML suspension Apply to corners of mouth three x daily 60 mL 1   Omega-3 1000 MG CAPS Take by mouth.     tiZANidine (ZANAFLEX) 4 MG tablet Take 1 tablet (4 mg total) by mouth every 6 (six) hours as needed for muscle spasms. 30 tablet 0   triamcinolone cream (KENALOG) 0.1 % Apply 1 application topically 2 (two) times daily. 80 g 1   TURMERIC PO Take by mouth.     No facility-administered medications prior to visit.    Allergies  Allergen Reactions   Doxycycline Swelling   Tetanus Toxoids Other (See Comments)    PAIN IN NECK AND FACE   Flexeril [Cyclobenzaprine] Other (See Comments)    ROS     Objective:    Physical Exam  There were no vitals taken for this visit. Wt Readings from Last 3 Encounters:   08/19/21 124 lb 9.6 oz (56.5 kg)  08/02/21 124 lb 9.6 oz (56.5 kg)  03/29/21 123 lb 12.8 oz (56.2 kg)    Diabetic Foot Exam - Simple   No data filed    Lab Results  Component Value Date   WBC 4.0 01/25/2021   HGB 12.9 01/25/2021   HCT 38.1 01/25/2021   PLT 272.0 01/25/2021   GLUCOSE 88 01/25/2021   ALT 15 01/25/2021   AST 23 01/25/2021   NA 140 01/25/2021   K 4.0 01/25/2021   CL 106 01/25/2021   CREATININE 0.88 01/25/2021   BUN 17 01/25/2021   CO2 25 01/25/2021   TSH 1.83 01/25/2021    Lab Results  Component Value Date   TSH 1.83 01/25/2021   Lab Results  Component Value Date   WBC 4.0 01/25/2021   HGB 12.9 01/25/2021   HCT 38.1 01/25/2021   MCV 93.4 01/25/2021   PLT 272.0 01/25/2021   Lab Results  Component Value Date   NA 140 01/25/2021   K 4.0 01/25/2021   CO2 25 01/25/2021   GLUCOSE 88 01/25/2021   BUN 17 01/25/2021   CREATININE 0.88 01/25/2021   BILITOT 0.6 01/25/2021   ALKPHOS 67 01/25/2021   AST 23 01/25/2021   ALT 15 01/25/2021   PROT 6.9 01/25/2021   ALBUMIN 4.6 01/25/2021   CALCIUM 10.0 01/25/2021   GFR 64.86 01/25/2021   No results found for: CHOL No results found for: HDL No results found for: LDLCALC No results found for: TRIG No results found for: CHOLHDL No results found for: ZOXW9U     Assessment & Plan:   Problem List Items Addressed This Visit   None   I am having Dawn Simmons maintain her multivitamin, Calcium-Magnesium, vitamin C, ergocalciferol, MAGNESIUM PO, Omega-3, TURMERIC PO, loratadine, nystatin, triamcinolone cream, diazepam, FLUoxetine, meloxicam, and tiZANidine.  No orders of the defined types were placed in this encounter.

## 2021-08-24 ENCOUNTER — Ambulatory Visit: Payer: Medicare Other | Admitting: Family Medicine

## 2021-08-24 DIAGNOSIS — M797 Fibromyalgia: Secondary | ICD-10-CM

## 2021-08-24 DIAGNOSIS — M545 Low back pain, unspecified: Secondary | ICD-10-CM

## 2021-08-24 DIAGNOSIS — R002 Palpitations: Secondary | ICD-10-CM

## 2021-08-24 DIAGNOSIS — D72819 Decreased white blood cell count, unspecified: Secondary | ICD-10-CM

## 2021-08-24 DIAGNOSIS — F4310 Post-traumatic stress disorder, unspecified: Secondary | ICD-10-CM

## 2021-08-26 ENCOUNTER — Ambulatory Visit (INDEPENDENT_AMBULATORY_CARE_PROVIDER_SITE_OTHER): Payer: Medicare Other | Admitting: Psychology

## 2021-08-26 DIAGNOSIS — F4310 Post-traumatic stress disorder, unspecified: Secondary | ICD-10-CM

## 2021-08-26 NOTE — Progress Notes (Signed)
[facility] Counselor/Therapist Progress Note  Patient ID: Dawn Simmons, MRN: [id number],    Date: 08/26/2021  Time Spent: 60 minutes  Treatment Type: Individual Therapy  Reported Symptoms: anxiety and depression and memory loss  Mental Status Exam: Appearance:  Casual     Behavior: Appropriate  Motor: Normal  Speech/Language:  Normal Rate  Affect: Blunt  Mood: pleasant  Thought process: normal  Thought content:   WNL  Sensory/Perceptual disturbances:   WNL  Orientation: oriented to person, place, time/date, and situation  Attention: Good  Concentration: Good  Memory: WNL  Fund of knowledge:  Good  Insight:   Good  Judgment:  Good  Impulse Control: Good   Risk Assessment: Danger to Self:  No Self-injurious Behavior: No Danger to Others: No Duty to Warn:no Physical Aggression / Violence:No  Access to Firearms a concern: No  Gang Involvement:No   Subjective: The patient attended a face-to-face individual therapy session in the office today.  The patient presents as pleasant and cooperative.  The patient continues to struggle with back issues this week however it does seem somewhat better.  The patient talked about gardening and feels good about this in relation to it helping her mood.  The patient did not have any complaints this week about any of her family members or feeling too frustrated about anything in particular.  She wants to continue weekly sessions at this point and I will continue to do this with her for a while and hopefully get her to the place where she can moved to every other week.  The patient seems to be doing well and is handling things at home well.  Interventions: Cognitive Behavioral Therapy and Eye Movement Desensitization and Reprocessing (EMDR)  Diagnosis:PTSD (post-traumatic stress disorder)  Plan: Treatment Plan  Strengths/Abilities:Insightful, motivated, supportive husband  Treatment Preferences: Outpatient Individual  therapy  Statement of Needs: "I have a problem passing out and I was told I have PTSD"    Symptoms:  Demonstrates an exaggerated startle response.:(Status: improved). Depressed  or irritable mood.: (Status: improved). Describes a reliving of the event,  particularly through dissociative flashbacks.:  (Status: improved). Displays a  significant decline in interest and engagement in activities.: (Status: improved). Displays significant psychological and/or physiological distress resulting from internal and external  clues that are reminiscent of the traumatic event.: (Status: improved).  Experiences disturbances in sleep.: (Status: improved). Experiences disturbing  and persistent thoughts, images, and/or perceptions of the traumatic event.: (Status: improved). Experiences frequent nightmares.: (Status: improved).  Feelings of hopelessness, worthlessness, or inappropriate guilt.: (Status:  improved). Has been exposed to a traumatic event involving actual or perceived threat of death or  serious injury.: (Status: maintained). Impairment in social, occupational, or  other areas of functioning.: (Status: improved). Intentionally avoids activities,  places, people, or objects (e.g., up-armored vehicles) that evoke memories of the event.:(Status: maintained). Intentionally avoids thoughts, feelings, or discussions related  to the traumatic event.: (Status: improved). Reports difficulty concentrating as  well as feelings of guilt.:  (Status: improved). Reports response of intense fear,  helplessness, or horror to the traumatic event.:  (Status: improved).  Problems Addressed: PTSD   Goals:  LTG:  1. Develop healthy thinking patterns and beliefs about self, others, and the world that lead to the alleviation and help prevent the relapse of  depression.  40% Objective Identify and replace thoughts and beliefs that support depression.  40 % Target Date: 2022-05-01 Frequency: Weekly Progress: 40  Modality: individual 2. Eliminate or reduce the negative impact  trauma related symptoms have  on social, occupational, and family functioning.  50% Objective Learn and implement personal skills to manage challenging situations related to trauma. Target Date: 2022-05-01 Frequency: Weekly Progress: 30 Modality: individual3. No longer avoids persons, places, activities, and objects that are  reminiscent of the traumatic event. Objective 3.  Participate in Eye Movement Desensitization and Reprocessing (EMDR) to reduce emotional distress  related to traumatic thoughts, feelings, and images. Target Date: 2022-05-01 Frequency: Weekly Progress: 30 Modality: individual 4.  Learn and implement guided self-dialogue to manage thoughts, feelings, and urges brought on by  encounters with trauma-related situations. Target Date: 2022-05-01 Frequency: Weekly Progress: 40 Modality: individual 5.  No longer experiences intrusive event recollections, avoidance of event  reminders, intense arousal, or disinterest in activities or  relationships. Target Date:05/01/2022  Frequency : Weekly Progress 30    Modality: individual 6.Thinks about or openly discusses the traumatic event with others  without experiencing psychological or physiological distress. Target Date : 05/01/2022 Frequency: Weekly Progress:30   Modality: Individual Interventions by Therapist:  CBT, problem solving therapy, EMDR, insight oriented.  Patient approved Treatment plan.  Willard Farquharson G Chace Klippel, LCSW                                     Daymein Nunnery G Kenzington Mielke, LCSW               Terresa Marlett G Giannamarie Paulus, LCSW               Mc Hollen G Janece Laidlaw, LCSW               Arrabella Westerman G Reo Portela, LCSW               Aislin Onofre G Enez Monahan, LCSW               Dimitria Ketchum G Giovoni Bunch, LCSW               Hadlie Gipson [employer], LCSW               Ivalee Strauser [employer],  LCSW               Sarim Rothman G Schyler Counsell, LCSW               Lynix Bonine G Special Ranes, LCSW               Terryl Molinelli G Ronique Simerly, LCSW               Debra Calabretta G Shiann Kam, LCSW               Terron Merfeld G Zacari Stiff, LCSW               Orland Visconti G Arnell Mausolf, LCSW               Dimitra Woodstock G Garnett Nunziata, LCSW               Hye Trawick G Sarvesh Meddaugh, LCSW

## 2021-09-02 ENCOUNTER — Ambulatory Visit (INDEPENDENT_AMBULATORY_CARE_PROVIDER_SITE_OTHER): Payer: Medicare Other | Admitting: Psychology

## 2021-09-02 DIAGNOSIS — F4310 Post-traumatic stress disorder, unspecified: Secondary | ICD-10-CM | POA: Diagnosis not present

## 2021-09-02 NOTE — Progress Notes (Signed)
[facility] Counselor/Therapist Progress Note  Patient ID: Dawn Simmons, MRN: [id number],    Date: 09/02/2021  Time Spent: 60 minutes  Treatment Type: Individual Therapy  Reported Symptoms: anxiety and depression and memory loss  Mental Status Exam: Appearance:  Casual     Behavior: Appropriate  Motor: Normal  Speech/Language:  Normal Rate  Affect: Blunt  Mood: pleasant  Thought process: normal  Thought content:   WNL  Sensory/Perceptual disturbances:   WNL  Orientation: oriented to person, place, time/date, and situation  Attention: Good  Concentration: Good  Memory: WNL  Fund of knowledge:  Good  Insight:   Good  Judgment:  Good  Impulse Control: Good   Risk Assessment: Danger to Self:  No Self-injurious Behavior: No Danger to Others: No Duty to Warn:no Physical Aggression / Violence:No  Access to Firearms a concern: No  Gang Involvement:No   Subjective: The patient attended a face-to-face individual therapy session in the office today.  The patient presents as anxious today.  The patient reports that a miracle has happened.  She states that her grandson, Ashley Mariner , lost his mother in the last few weeks and will potentially be moving back in with his biological father who is her son.  The patient has had extreme difficulty dealing with the loss of this grandson.  The patient was having mixed feelings because she is happy that she is going to hopefully get to see her grandson again, but is fearful that his other grandfather will do something to sabotage the situation.  We talked about trying to stay in the present moment and not doing what if thinking.  The patient is struggling with being overwhelmed by all that is happening now.  We did some deep breathing and I recommended that she just stay in the present moment as much as she can.   Interventions: Cognitive Behavioral Therapy and Eye Movement Desensitization and Reprocessing (EMDR)  Diagnosis:PTSD  (post-traumatic stress disorder)  Plan: Treatment Plan  Strengths/Abilities:Insightful, motivated, supportive husband  Treatment Preferences: Outpatient Individual therapy  Statement of Needs: "I have a problem passing out and I was told I have PTSD"    Symptoms:  Demonstrates an exaggerated startle response.:(Status: improved). Depressed  or irritable mood.: (Status: improved). Describes a reliving of the event,  particularly through dissociative flashbacks.:  (Status: improved). Displays a  significant decline in interest and engagement in activities.: (Status: improved). Displays significant psychological and/or physiological distress resulting from internal and external  clues that are reminiscent of the traumatic event.: (Status: improved).  Experiences disturbances in sleep.: (Status: improved). Experiences disturbing  and persistent thoughts, images, and/or perceptions of the traumatic event.: (Status: improved). Experiences frequent nightmares.: (Status: improved).  Feelings of hopelessness, worthlessness, or inappropriate guilt.: (Status:  improved). Has been exposed to a traumatic event involving actual or perceived threat of death or  serious injury.: (Status: maintained). Impairment in social, occupational, or  other areas of functioning.: (Status: improved). Intentionally avoids activities,  places, people, or objects (e.g., up-armored vehicles) that evoke memories of the event.:(Status: maintained). Intentionally avoids thoughts, feelings, or discussions related  to the traumatic event.: (Status: improved). Reports difficulty concentrating as  well as feelings of guilt.:  (Status: improved). Reports response of intense fear,  helplessness, or horror to the traumatic event.:  (Status: improved).  Problems Addressed: PTSD   Goals:  LTG:  1. Develop healthy thinking patterns and beliefs about self, others, and the world that lead to the alleviation and help prevent the  relapse of  depression.  40% Objective Identify and replace thoughts and beliefs that support depression.  40 % Target Date: 2022-05-01 Frequency: Weekly Progress: 40 Modality: individual 2. Eliminate or reduce the negative impact trauma related symptoms have  on social, occupational, and family functioning.  50% Objective Learn and implement personal skills to manage challenging situations related to trauma. Target Date: 2022-05-01 Frequency: Weekly Progress: 30 Modality: individual3. No longer avoids persons, places, activities, and objects that are  reminiscent of the traumatic event. Objective 3.  Participate in Eye Movement Desensitization and Reprocessing (EMDR) to reduce emotional distress  related to traumatic thoughts, feelings, and images. Target Date: 2022-05-01 Frequency: Weekly Progress: 30 Modality: individual 4.  Learn and implement guided self-dialogue to manage thoughts, feelings, and urges brought on by  encounters with trauma-related situations. Target Date: 2022-05-01 Frequency: Weekly Progress: 40 Modality: individual 5.  No longer experiences intrusive event recollections, avoidance of event  reminders, intense arousal, or disinterest in activities or  relationships. Target Date:05/01/2022  Frequency : Weekly Progress 30    Modality: individual 6.Thinks about or openly discusses the traumatic event with others  without experiencing psychological or physiological distress. Target Date : 05/01/2022 Frequency: Weekly Progress:30   Modality: Individual Interventions by Therapist:  CBT, problem solving therapy, EMDR, insight oriented.  Patient approved Treatment plan.  Katyana Trolinger G Virginie Josten, LCSW                                     Deepika Decatur G Ozelle Brubacher, LCSW               Beretta Ginsberg G Verniece Encarnacion, LCSW               Alianis Trimmer G Safiyah Cisney, LCSW               Christal Lagerstrom G Irisa Grimsley, LCSW               Sandon Yoho G  Star Resler, LCSW               Jossiah Smoak [employer], LCSW               Wille Aubuchon [employer], LCSW               Roshanda Balazs [employer], LCSW               Oshay Stranahan G Arturo Freundlich, LCSW               Marquasha Brutus G Triva Hueber, LCSW               Mirakle Tomlin G Chukwuma Straus, LCSW               Brieann Osinski G Valori Hollenkamp, LCSW               Fleetwood Pierron G Jakobie Henslee, LCSW               Raoul Ciano G Hershel Corkery, LCSW               Briant Angelillo G Yohanna Tow, LCSW               Zakaiya Lares G Arraya Buck, LCSW               Jamirah Zelaya G Jakari Jacot, LCSW

## 2021-09-07 ENCOUNTER — Ambulatory Visit (INDEPENDENT_AMBULATORY_CARE_PROVIDER_SITE_OTHER): Payer: Medicare Other | Admitting: Family Medicine

## 2021-09-07 ENCOUNTER — Encounter: Payer: Self-pay | Admitting: Family Medicine

## 2021-09-07 VITALS — BP 122/70 | HR 88 | Resp 20 | Ht 66.0 in | Wt 124.2 lb

## 2021-09-07 DIAGNOSIS — U099 Post covid-19 condition, unspecified: Secondary | ICD-10-CM | POA: Diagnosis not present

## 2021-09-07 DIAGNOSIS — M545 Low back pain, unspecified: Secondary | ICD-10-CM

## 2021-09-07 DIAGNOSIS — M797 Fibromyalgia: Secondary | ICD-10-CM

## 2021-09-07 DIAGNOSIS — R002 Palpitations: Secondary | ICD-10-CM | POA: Diagnosis not present

## 2021-09-07 DIAGNOSIS — D72819 Decreased white blood cell count, unspecified: Secondary | ICD-10-CM | POA: Diagnosis not present

## 2021-09-07 DIAGNOSIS — R531 Weakness: Secondary | ICD-10-CM

## 2021-09-07 DIAGNOSIS — Z87898 Personal history of other specified conditions: Secondary | ICD-10-CM

## 2021-09-07 DIAGNOSIS — I341 Nonrheumatic mitral (valve) prolapse: Secondary | ICD-10-CM | POA: Diagnosis not present

## 2021-09-07 DIAGNOSIS — M5441 Lumbago with sciatica, right side: Secondary | ICD-10-CM | POA: Diagnosis not present

## 2021-09-07 MED ORDER — METHYLPREDNISOLONE 4 MG PO TABS: ORAL_TABLET | ORAL | 0 refills | Status: DC

## 2021-09-07 NOTE — Progress Notes (Signed)
Subjective:   By signing my name below, I, Dawn Simmons, attest that this documentation has been prepared under the direction and in the presence of Dawn Canary, MD. 09/07/2021    Patient ID: Dawn Simmons, female    DOB: June 12, 1945, 76 y.o.   MRN: [id number]  No chief complaint on file.   HPI Patient is in today for an office visit. She is accompanied by her husband.   She was seen by Dawn Simmons on 04/24 and 05/11 after complaining of back pain that started after she was gardening. She was given prednisone and notes it provided temporary relief. She would like to start with sports medicine. She takes 2.5 - 5 mg diazepam and is doing well with it. Also applying ice packs to the area and it is providing great relief. She is worried because she cannot work in her garden anymore. Adds that the pain radiates down her right leg and up her arm into her head.   She has been drinking prune juice to manage her constipation.   She started nicotine patches and has been feeling better with it. Notes she is feeling more energized and is remembering things.   Past Medical History:  Diagnosis Date   H/O fibromyalgia    H/O insomnia    H/O measles    H/O rubella    H/O syncope    History of chicken pox 09/15/2019   History of palpitations    Hx of mumps    Mitral valve prolapse    Palpitations    Syncope    Von Willebrand disease (HCC)     Past Surgical History:  Procedure Laterality Date   NO PAST SURGERIES      Family History  Problem Relation Age of Onset   Stroke Mother    Emphysema Father        smoked   Alcohol abuse Father    Heart attack Maternal Grandfather    Heart attack Paternal Grandfather    Heart attack Maternal Uncle    Heart failure Maternal Grandmother    Heart failure Paternal Grandmother    OCD Son    Drug abuse Son    Seizures Neg Hx     Social History   Socioeconomic History   Marital status: Married    Spouse name: Dawn Simmons   Number of children: 4    Years of education: 12   Highest education level: Not on file  Occupational History   Not on file  Tobacco Use   Smoking status: Former    Packs/day: 0.50    Years: 10.00    Pack years: 5.00    Types: Cigarettes    Quit date: 04/27/1972    Years since quitting: 49.3   Smokeless tobacco: Never  Vaping Use   Vaping Use: Never used  Substance and Sexual Activity   Alcohol use: Yes    Alcohol/week: 0.0 standard drinks    Comment: occ 1 glass wine or beer   Drug use: No   Sexual activity: Not on file  Other Topics Concern   Not on file  Social History Narrative   Lives at home with husband   Caffeine use- tea, 1 cup/day   Social Determinants of Health   Financial Resource Strain: Not on file  Food Insecurity: Not on file  Transportation Needs: No Transportation Needs   Lack of Transportation (Medical): No   Lack of Transportation (Non-Medical): No  Physical Activity: Inactive   Days of Exercise per  Week: 0 days   Minutes of Exercise per Session: 0 min  Stress: Not on file  Social Connections: Not on file  Intimate Partner Violence: Not At Risk   Fear of Current or Ex-Partner: No   Emotionally Abused: No   Physically Abused: No   Sexually Abused: No    Outpatient Medications Prior to Visit  Medication Sig Dispense Refill   Ascorbic Acid (VITAMIN C) 100 MG tablet Take 100 mg by mouth daily.     Calcium-Magnesium 200-100 MG TABS Take 4 tablets by mouth daily. 2 tablets in the morning and 2 tablets in the evening     diazepam (VALIUM) 5 MG tablet Take 1 tablet (5 mg total) by mouth at bedtime. Take 1/2-1 tablet po QHS 90 tablet 0   ergocalciferol (DRISDOL) 200 MCG/ML drops Take by mouth daily. PURE brand     FLUoxetine (PROZAC) 10 MG capsule Take 2 capsules (20 mg total) by mouth every morning. 180 capsule 0   loratadine (CLARITIN) 10 MG tablet Take 10 mg by mouth daily.     MAGNESIUM PO Take 200 mg by mouth at bedtime.     meloxicam (MOBIC) 15 MG tablet Take 1 tablet (15  mg total) by mouth daily. 30 tablet 0   Multiple Vitamin (MULTIVITAMIN) capsule Take 1 capsule by mouth daily.     nystatin (MYCOSTATIN) 100000 UNIT/ML suspension Apply to corners of mouth three x daily 60 mL 1   Omega-3 1000 MG CAPS Take by mouth.     tiZANidine (ZANAFLEX) 4 MG tablet Take 1 tablet (4 mg total) by mouth every 6 (six) hours as needed for muscle spasms. 30 tablet 0   triamcinolone cream (KENALOG) 0.1 % Apply 1 application topically 2 (two) times daily. 80 g 1   TURMERIC PO Take by mouth.     No facility-administered medications prior to visit.    Allergies  Allergen Reactions   Doxycycline Swelling   Tetanus Toxoids Other (See Comments)    PAIN IN NECK AND FACE   Flexeril [Cyclobenzaprine] Other (See Comments)    Review of Systems  Constitutional:  Negative for fever and malaise/fatigue.  HENT:  Negative for congestion.   Eyes:  Negative for redness.  Respiratory:  Negative for shortness of breath.   Cardiovascular:  Negative for chest pain, palpitations and leg swelling.  Gastrointestinal:  Negative for abdominal pain, blood in stool and nausea.  Genitourinary:  Negative for dysuria and frequency.  Musculoskeletal:  Positive for back pain (right lower). Negative for falls.  Skin:  Negative for rash.  Neurological:  Negative for dizziness, loss of consciousness and headaches.  Endo/Heme/Allergies:  Negative for polydipsia.  Psychiatric/Behavioral:  Negative for depression. The patient is not nervous/anxious.       Objective:    Physical Exam Constitutional:      General: She is not in acute distress.    Appearance: She is well-developed.  HENT:     Head: Normocephalic and atraumatic.  Eyes:     Conjunctiva/sclera: Conjunctivae normal.  Neck:     Thyroid: No thyromegaly.  Cardiovascular:     Rate and Rhythm: Normal rate and regular rhythm.     Heart sounds: Normal heart sounds. No murmur heard. Pulmonary:     Effort: Pulmonary effort is normal. No  respiratory distress.     Breath sounds: Normal breath sounds.  Abdominal:     General: Bowel sounds are normal. There is no distension.     Palpations: Abdomen is soft. There  is no mass.     Tenderness: There is no abdominal tenderness.  Musculoskeletal:     Cervical back: Neck supple.  Lymphadenopathy:     Cervical: No cervical adenopathy.  Skin:    General: Skin is warm and dry.  Neurological:     Mental Status: She is alert and oriented to person, place, and time.  Psychiatric:        Behavior: Behavior normal.    There were no vitals taken for this visit. Wt Readings from Last 3 Encounters:  08/19/21 124 lb 9.6 oz (56.5 kg)  08/02/21 124 lb 9.6 oz (56.5 kg)  03/29/21 123 lb 12.8 oz (56.2 kg)    Diabetic Foot Exam - Simple   No data filed    Lab Results  Component Value Date   WBC 4.0 01/25/2021   HGB 12.9 01/25/2021   HCT 38.1 01/25/2021   PLT 272.0 01/25/2021   GLUCOSE 88 01/25/2021   ALT 15 01/25/2021   AST 23 01/25/2021   NA 140 01/25/2021   K 4.0 01/25/2021   CL 106 01/25/2021   CREATININE 0.88 01/25/2021   BUN 17 01/25/2021   CO2 25 01/25/2021   TSH 1.83 01/25/2021    Lab Results  Component Value Date   TSH 1.83 01/25/2021   Lab Results  Component Value Date   WBC 4.0 01/25/2021   HGB 12.9 01/25/2021   HCT 38.1 01/25/2021   MCV 93.4 01/25/2021   PLT 272.0 01/25/2021   Lab Results  Component Value Date   NA 140 01/25/2021   K 4.0 01/25/2021   CO2 25 01/25/2021   GLUCOSE 88 01/25/2021   BUN 17 01/25/2021   CREATININE 0.88 01/25/2021   BILITOT 0.6 01/25/2021   ALKPHOS 67 01/25/2021   AST 23 01/25/2021   ALT 15 01/25/2021   PROT 6.9 01/25/2021   ALBUMIN 4.6 01/25/2021   CALCIUM 10.0 01/25/2021   GFR 64.86 01/25/2021   No results found for: CHOL No results found for: HDL No results found for: LDLCALC No results found for: TRIG No results found for: CHOLHDL No results found for: [id number]     Assessment & Plan:   Problem List  Items Addressed This Visit   None  No orders of the defined types were placed in this encounter.   I,Dawn Simmons,acting as a Neurosurgeonscribe for Danise EdgeStacey Niva Murren, MD.,have documented all relevant documentation on the behalf of Danise EdgeStacey Takeria Marquina, MD,as directed by  Danise EdgeStacey Nerine Pulse, MD while in the presence of Danise EdgeStacey Kaysha Parsell, MD.   I, Dawn CanaryStacey A Roran Wegner, MD. , personally preformed the services described in this documentation.  All medical record entries made by the scribe were at my direction and in my presence.  I have reviewed the chart and discharge instructions (if applicable) and agree that the record reflects my personal performance and is accurate and complete. 09/07/2021

## 2021-09-07 NOTE — Assessment & Plan Note (Signed)
No recent episodes

## 2021-09-07 NOTE — Assessment & Plan Note (Signed)
No recent flares 

## 2021-09-07 NOTE — Assessment & Plan Note (Signed)
Continue to monitor

## 2021-09-07 NOTE — Patient Instructions (Addendum)
Tizanidine 4 mg tabs take 1/2 tab every 6 hours as needed during the day  Meloxicam is just once daily  Can use the Medrol dosepak if pain flares  Encouraged increased hydration and fiber in diet. Daily probiotics. If bowels not moving can use Milk Of Magnesia 2 tbls po in 4 oz of warm prune juice by mouth every 2-3 days. If no results then repeat in 4 hours with  Dulcolax suppository pr, may repeat again in 4 more hours as needed. Seek care if symptoms worsen. Consider daily Miralax and/or Dulcolax if symptoms persist.    AZO cranberry tabs   Acute Back Pain, Adult Acute back pain is sudden and usually short-lived. It is often caused by an injury to the muscles and tissues in the back. The injury may result from: A muscle, tendon, or ligament getting overstretched or torn. Ligaments are tissues that connect bones to each other. Lifting something improperly can cause a back strain. Wear and tear (degeneration) of the spinal disks. Spinal disks are circular tissue that provide cushioning between the bones of the spine (vertebrae). Twisting motions, such as while playing sports or doing yard work. A hit to the back. Arthritis. You may have a physical exam, lab tests, and imaging tests to find the cause of your pain. Acute back pain usually goes away with rest and home care. Follow these instructions at home: Managing pain, stiffness, and swelling Take over-the-counter and prescription medicines only as told by your health care provider. Treatment may include medicines for pain and inflammation that are taken by mouth or applied to the skin, or muscle relaxants. Your health care provider may recommend applying ice during the first 24-48 hours after your pain starts. To do this: Put ice in a plastic bag. Place a towel between your skin and the bag. Leave the ice on for 20 minutes, 2-3 times a day. Remove the ice if your skin turns bright red. This is very important. If you cannot feel pain,  heat, or cold, you have a greater risk of damage to the area. If directed, apply heat to the affected area as often as told by your health care provider. Use the heat source that your health care provider recommends, such as a moist heat pack or a heating pad. Place a towel between your skin and the heat source. Leave the heat on for 20-30 minutes. Remove the heat if your skin turns bright red. This is especially important if you are unable to feel pain, heat, or cold. You have a greater risk of getting burned. Activity  Do not stay in bed. Staying in bed for more than 1-2 days can delay your recovery. Sit up and stand up straight. Avoid leaning forward when you sit or hunching over when you stand. If you work at a desk, sit close to it so you do not need to lean over. Keep your chin tucked in. Keep your neck drawn back, and keep your elbows bent at a 90-degree angle (right angle). Sit high and close to the steering wheel when you drive. Add lower back (lumbar) support to your car seat, if needed. Take short walks on even surfaces as soon as you are able. Try to increase the length of time you walk each day. Do not sit, drive, or stand in one place for more than 30 minutes at a time. Sitting or standing for long periods of time can put stress on your back. Do not drive or use heavy machinery  while taking prescription pain medicine. Use proper lifting techniques. When you bend and lift, use positions that put less stress on your back: [place] your knees. Keep the load close to your body. Avoid twisting. Exercise regularly as told by your health care provider. Exercising helps your back heal faster and helps prevent back injuries by keeping muscles strong and flexible. Work with a physical therapist to make a safe exercise program, as recommended by your health care provider. Do any exercises as told by your physical therapist. Lifestyle Maintain a healthy weight. Extra weight puts stress on your  back and makes it difficult to have good posture. Avoid activities or situations that make you feel anxious or stressed. Stress and anxiety increase muscle tension and can make back pain worse. Learn ways to manage anxiety and stress, such as through exercise. General instructions Sleep on a firm mattress in a comfortable position. Try lying on your side with your knees slightly bent. If you lie on your back, put a pillow under your knees. Keep your head and neck in a straight line with your spine (neutral position) when using electronic equipment like smartphones or pads. To do this: Raise your smartphone or pad to look at it instead of bending your head or neck to look down. Put the smartphone or pad at the level of your face while looking at the screen. Follow your treatment plan as told by your health care provider. This may include: Cognitive or behavioral therapy. Acupuncture or massage therapy. Meditation or yoga. Contact a health care provider if: You have pain that is not relieved with rest or medicine. You have increasing pain going down into your legs or buttocks. Your pain does not improve after 2 weeks. You have pain at night. You lose weight without trying. You have a fever or chills. You develop nausea or vomiting. You develop abdominal pain. Get help right away if: You develop new bowel or bladder control problems. You have unusual weakness or numbness in your arms or legs. You feel faint. These symptoms may represent a serious problem that is an emergency. Do not wait to see if the symptoms will go away. Get medical help right away. Call your local emergency services (911 in the [place] Do not drive yourself to the hospital. Summary Acute back pain is sudden and usually short-lived. Use proper lifting techniques. When you bend and lift, use positions that put less stress on your back. Take over-the-counter and prescription medicines only as told by your health care  provider, and apply heat or ice as told. This information is not intended to replace advice given to you by your health care provider. Make sure you discuss any questions you have with your health care provider. Document Revised: 06/19/2020 Document Reviewed: 06/19/2020 Elsevier Patient Education  2023 [employer].

## 2021-09-08 DIAGNOSIS — M545 Low back pain, unspecified: Secondary | ICD-10-CM | POA: Insufficient documentation

## 2021-09-08 HISTORY — DX: Low back pain, unspecified: M54.50

## 2021-09-08 NOTE — Assessment & Plan Note (Signed)
She believes she over did it int he yard and after working and then sitting she had very painful spasm over low back and left hip. She reports the worse pain she has ever experienced. She tookt he Prednisone and it was helpful but pain persists. She is ready for Sports Med referral which is placed. Encouraged moist heat and gentle stretching as tolerated. May try NSAIDs and prescription meds as directed and report if symptoms worsen or seek immediate care. She has not tried the Meloxicam or Tizanidine and is encouraged to try as needed. Given a Medrol dose pak to use prn if pain escalates while she is waiting for her appt.

## 2021-09-08 NOTE — Assessment & Plan Note (Signed)
She has been using a portion of a nicotine patch daily and that has helped her energy and pain

## 2021-09-09 ENCOUNTER — Ambulatory Visit (INDEPENDENT_AMBULATORY_CARE_PROVIDER_SITE_OTHER): Payer: Medicare Other | Admitting: Psychology

## 2021-09-09 DIAGNOSIS — F4310 Post-traumatic stress disorder, unspecified: Secondary | ICD-10-CM | POA: Diagnosis not present

## 2021-09-09 NOTE — Progress Notes (Signed)
[facility] Counselor/Therapist Progress Note  Patient ID: Dawn Simmons, MRN: [id number],    Date: 09/09/2021  Time Spent: 60 minutes  Treatment Type: Individual Therapy  Reported Symptoms: anxiety and depression and memory loss  Mental Status Exam: Appearance:  Casual     Behavior: Appropriate  Motor: Normal  Speech/Language:  Normal Rate  Affect: Blunt  Mood: pleasant  Thought process: normal  Thought content:   WNL  Sensory/Perceptual disturbances:   WNL  Orientation: oriented to person, place, time/date, and situation  Attention: Good  Concentration: Good  Memory: WNL  Fund of knowledge:  Good  Insight:   Good  Judgment:  Good  Impulse Control: Good   Risk Assessment: Danger to Self:  No Self-injurious Behavior: No Danger to Others: No Duty to Warn:no Physical Aggression / Violence:No  Access to Firearms a concern: No  Gang Involvement:No   Subjective: The patient attended a face-to-face individual therapy session in the office today.  The patient presents as anxious today.  The patient continues to be anxious because of the situation with her grandson.  The patient reports that her son had to take a DNA test and they are waiting for the results.  I reassured the patient that if he comes back as the biological father there is nothing to worry about in regard to whether he will be able to get Ronan or not.  If he comes back as not the father then more than likely they will look at next of kin as to who they placed him with.  It may be that if he has no next of kin that is willing to take him they would still give him to his only known father.  The patient seemed more reassured when she left and I recommended that she calm herself down and quit giving herself the message that she is going to pass out that she can handle this and it is going to be okay because this is what she ultimately wanted.   Interventions: Cognitive Behavioral Therapy and Eye Movement  Desensitization and Reprocessing (EMDR)  Diagnosis:PTSD (post-traumatic stress disorder)  Plan: Treatment Plan  Strengths/Abilities:Insightful, motivated, supportive husband  Treatment Preferences: Outpatient Individual therapy  Statement of Needs: "I have a problem passing out and I was told I have PTSD"    Symptoms:  Demonstrates an exaggerated startle response.:(Status: improved). Depressed  or irritable mood.: (Status: improved). Describes a reliving of the event,  particularly through dissociative flashbacks.:  (Status: improved). Displays a  significant decline in interest and engagement in activities.: (Status: improved). Displays significant psychological and/or physiological distress resulting from internal and external  clues that are reminiscent of the traumatic event.: (Status: improved).  Experiences disturbances in sleep.: (Status: improved). Experiences disturbing  and persistent thoughts, images, and/or perceptions of the traumatic event.: (Status: improved). Experiences frequent nightmares.: (Status: improved).  Feelings of hopelessness, worthlessness, or inappropriate guilt.: (Status:  improved). Has been exposed to a traumatic event involving actual or perceived threat of death or  serious injury.: (Status: maintained). Impairment in social, occupational, or  other areas of functioning.: (Status: improved). Intentionally avoids activities,  places, people, or objects (e.g., up-armored vehicles) that evoke memories of the event.:(Status: maintained). Intentionally avoids thoughts, feelings, or discussions related  to the traumatic event.: (Status: improved). Reports difficulty concentrating as  well as feelings of guilt.:  (Status: improved). Reports response of intense fear,  helplessness, or horror to the traumatic event.:  (Status: improved).  Problems Addressed: PTSD   Goals:  LTG:  1. Develop healthy thinking patterns and beliefs about self, others, and  the world that lead to the alleviation and help prevent the relapse of  depression.  40% Objective Identify and replace thoughts and beliefs that support depression.  40 % Target Date: 2022-05-01 Frequency: Weekly Progress: 40 Modality: individual 2. Eliminate or reduce the negative impact trauma related symptoms have  on social, occupational, and family functioning.  50% Objective Learn and implement personal skills to manage challenging situations related to trauma. Target Date: 2022-05-01 Frequency: Weekly Progress: 30 Modality: individual3. No longer avoids persons, places, activities, and objects that are  reminiscent of the traumatic event. Objective 3.  Participate in Eye Movement Desensitization and Reprocessing (EMDR) to reduce emotional distress  related to traumatic thoughts, feelings, and images. Target Date: 2022-05-01 Frequency: Weekly Progress: 30 Modality: individual 4.  Learn and implement guided self-dialogue to manage thoughts, feelings, and urges brought on by  encounters with trauma-related situations. Target Date: 2022-05-01 Frequency: Weekly Progress: 40 Modality: individual 5.  No longer experiences intrusive event recollections, avoidance of event  reminders, intense arousal, or disinterest in activities or  relationships. Target Date:05/01/2022  Frequency : Weekly Progress 30    Modality: individual 6.Thinks about or openly discusses the traumatic event with others  without experiencing psychological or physiological distress. Target Date : 05/01/2022 Frequency: Weekly Progress:30   Modality: Individual Interventions by Therapist:  CBT, problem solving therapy, EMDR, insight oriented.  Patient approved Treatment plan.  Dacotah Cabello G Mitra Duling, LCSW                                     Mileah Hemmer G Ourania Hamler, LCSW               Naliya Gish G Raydan Schlabach, LCSW               Rachael Ferrie G Ermagene Saidi,  LCSW               Jemar Paulsen G Aeden Matranga, LCSW               Quintessa Simmerman G Yukio Bisping, LCSW               Khandi Kernes [employer], LCSW               Mikal Blasdell [employer], LCSW               Milka Windholz [employer], LCSW               Prerna Harold G Rubel Heckard, LCSW               Ileana Chalupa G Tariyah Pendry, LCSW               Kyan Giannone G Jaren Kearn, LCSW               Leory Allinson G Jaquaya Coyle, LCSW               Nekhi Liwanag G Jametta [place], LCSW               Damere Brandenburg G Hansen Carino, LCSW               Zaki Gertsch G Elias Bordner, LCSW               Nishanth Mccaughan G Duy Lemming, LCSW               Sencere Symonette G Gentry Pilson, LCSW  Abegail Kloeppel G Lynnann Knudsen, LCSW

## 2021-09-10 ENCOUNTER — Ambulatory Visit (INDEPENDENT_AMBULATORY_CARE_PROVIDER_SITE_OTHER): Payer: Medicare Other

## 2021-09-10 ENCOUNTER — Ambulatory Visit (INDEPENDENT_AMBULATORY_CARE_PROVIDER_SITE_OTHER): Payer: Medicare Other | Admitting: Family Medicine

## 2021-09-10 ENCOUNTER — Encounter: Payer: Self-pay | Admitting: Family Medicine

## 2021-09-10 VITALS — BP 84/60 | HR 82 | Ht 66.0 in | Wt 122.6 lb

## 2021-09-10 DIAGNOSIS — M5441 Lumbago with sciatica, right side: Secondary | ICD-10-CM

## 2021-09-10 DIAGNOSIS — M545 Low back pain, unspecified: Secondary | ICD-10-CM | POA: Diagnosis not present

## 2021-09-10 DIAGNOSIS — M5440 Lumbago with sciatica, unspecified side: Secondary | ICD-10-CM | POA: Diagnosis not present

## 2021-09-10 DIAGNOSIS — R410 Disorientation, unspecified: Secondary | ICD-10-CM

## 2021-09-10 NOTE — Progress Notes (Signed)
I, Christoper Fabian, LAT, ATC, am serving as scribe for Dr. [name].  Dawn Simmons is a 76 y.o. female who presents to [facility] at [facility] today for low back pain. Pt was previously seen by Dr. Denyse Amass on 8/0/9/21 for chronic L shoulder pain and bilat GT bursitis. Today, pt c/o low back pain x approximately one month that started after resting following doing some yard work and gardening. Pt locates pain to the R-side of her low back. She was initially prescribed prednisone that did help w/ her symptoms but then symptoms returned after she finished the prednisone.  She has been referred to PT but has not completed any visits.  Radiating pain: yes into her R buttock and  LE numbness/tingling: possibly in her R leg Aggravating factors:  Treatments tried: Meloxicam and Tizanidine prescribed but not taking; prednisone dose pack; Tylenol; Advil; heat;   She also notes significant challenges with memory and concentration over the last several years she attributes secondary to a COVID-19 episode that was particularly bad.  She has had some work-up already including a CT scan of her head in 2021 that was normal.  She notes that she is using a piece of a low-dose nicotine patch which seems to be helping with her memory a bit.  Dx imaging: L-spine XR- 09/10/21 01/10/20 R hip XR  10/04/09 L-spine MRI  Pertinent review of systems: No fevers or chills  Relevant historical information: Von Willebrand's disease.  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   Exam:  BP (!) 84/60 (BP Location: Right Arm, Patient Position: Sitting, Cuff Size: Normal)   Pulse 82   Ht 5\' 6"  (1.676 m)   Wt 122 lb 9.6 oz (55.6 kg)   SpO2 96%   BMI 19.79 kg/m  General: Well Developed, well nourished, and in no acute distress.   MSK: L-spine: Nontender midline.  Tender palpation lumbar paraspinal musculature. Decreased lumbar motion. Lower extremity strength is intact. Reflexes are intact.  Neuropsych: Alert and oriented.   Memory is poor.  I had to redirect several times and repeat goals and plans several times during the visit.     Lab and Radiology Results  X-ray images L-spine obtained today personally and independently interpreted. Minimal scoliosis.  No acute fractures. Await formal radiology review     Assessment and Plan: 76 y.o. female with subacute right low back pain.  Pain ongoing for 5 to 6 weeks.  Pain thought to be due to myofascial spasm and dysfunction.  She is a good candidate for physical therapy for this issue.  Plan for trial of PT heating pad and recheck in 6 weeks.  However this occurs in the setting of a significant change in her baseline mental acuity memory and concentration.  This is attributable to COVID-19 infection in the past however there may be more going on here.  We will plan for trial of cognitive rehab with speech therapy at [facility] at the same location where she is going to get her conventional physical therapy.  If this is not successful she may benefit from a more thorough evaluation including ultimately a neuropsychological evaluation and perhaps a neurology evaluation as well.  She is not 1 for medications but may benefit from Aricept as well.  I think she is getting some partial acetylcholine benefit from the nicotine patch that she probably could get better with Aricept but we can leave that discussion for a later day.   PDMP not reviewed this encounter. Orders Placed This Encounter  Procedures   DG Lumbar Spine 2-3 Views    Standing Status:   Future    Number of Occurrences:   1    Standing Expiration Date:   10/10/2021    Order Specific Question:   Reason for Exam (SYMPTOM  OR DIAGNOSIS REQUIRED)    Answer:   low back pain    Order Specific Question:   Preferred imaging location?    Answer:   [facility]   Ambulatory referral to Physical Therapy    Referral Priority:   Routine    Referral Type:   Physical Medicine    Referral Reason:    Specialty Services Required    Requested Specialty:   Physical Therapy    Number of Visits Requested:   1   Ambulatory referral to Speech Therapy    Referral Priority:   Routine    Referral Type:   Speech Therapy    Referral Reason:   Specialty Services Required    Requested Specialty:   Speech Pathology    Number of Visits Requested:   1   No orders of the defined types were placed in this encounter.    Discussed warning signs or symptoms. Please see discharge instructions. Patient expresses understanding.   The above documentation has been reviewed and is accurate and complete [name], M.D.

## 2021-09-10 NOTE — Patient Instructions (Addendum)
Nice to see you today.  [facility] for speech and physical therapy.  Their office will call you to schedule but please let us know if you don't hear from them in one week regarding scheduling.  Follow-up: 6 weeks

## 2021-09-13 ENCOUNTER — Ambulatory Visit: Payer: Medicare Other | Attending: Family Medicine | Admitting: Physical Therapy

## 2021-09-13 ENCOUNTER — Other Ambulatory Visit: Payer: Self-pay

## 2021-09-13 DIAGNOSIS — M5441 Lumbago with sciatica, right side: Secondary | ICD-10-CM | POA: Insufficient documentation

## 2021-09-13 DIAGNOSIS — M6281 Muscle weakness (generalized): Secondary | ICD-10-CM | POA: Insufficient documentation

## 2021-09-13 DIAGNOSIS — R41841 Cognitive communication deficit: Secondary | ICD-10-CM | POA: Insufficient documentation

## 2021-09-13 NOTE — Patient Instructions (Signed)
Access Code: [email or web address] URL: [email or web address] Date: 09/13/2021 Prepared by: [facility] - [facility]  Exercises - Hooklying Single Knee to Chest Stretch  - 1-2 x daily - 5 x weekly - 1 sets - 3 reps - 15 sec hold - Supine Posterior Pelvic Tilt  - 1-2 x daily - 7 x weekly - 1 sets - 10 reps - Supine Lower Trunk Rotation  - 1-2 x daily - 7 x weekly - 1 sets - 10 reps

## 2021-09-13 NOTE — Progress Notes (Signed)
Lumbar spine x-ray shows scoliosis curvature and arthritis changes in the low back.

## 2021-09-13 NOTE — Therapy (Signed)
OUTPATIENT PHYSICAL THERAPY NEURO EVALUATION   Patient Name: Shaneisha Burkel MRN: [id number] DOB:05-Jul-1945, 76 y.o., female Today's Date: 09/14/2021   PCP: Joaquin Courts, MD REFERRING PROVIDER: [name], MD   PT End of Session - 09/13/21 1456     Visit Number 1    Number of Visits 9    Date for PT Re-Evaluation 10/12/21    Authorization Type Medicare    PT Start Time 1500    PT Stop Time 1540    PT Time Calculation (min) 40 min    Activity Tolerance Patient tolerated treatment well    Behavior During Therapy [facility] for tasks assessed/performed             Past Medical History:  Diagnosis Date   H/O fibromyalgia    H/O insomnia    H/O measles    H/O rubella    H/O syncope    History of chicken pox 09/15/2019   History of palpitations    Hx of mumps    Mitral valve prolapse    Palpitations    Syncope    Von Willebrand disease (HCC)    Past Surgical History:  Procedure Laterality Date   NO PAST SURGERIES     Patient Active Problem List   Diagnosis Date Noted   Low back pain 09/08/2021   Allergies 08/04/2020   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   Right groin pain 01/10/2020   Right sided weakness 01/10/2020   Left shoulder pain 10/31/2019   Leukopenia 09/15/2019   PTSD (post-traumatic stress disorder) 09/15/2019   History of chicken pox 09/15/2019   Von Willebrand disease (HCC)    Upper airway cough syndrome 09/06/2018   Fibromyalgia 06/01/2015   Insomnia 06/01/2015   H/O syncope    Palpitations    Mitral valve prolapse     ONSET DATE: 09/10/2021 (MD referral); acute back pain started early May 2023  REFERRING DIAG: M54.41 (ICD-10-CM) - Acute right-sided low back pain with right-sided sciatica  THERAPY DIAG:  Acute right-sided low back pain with right-sided sciatica  Muscle weakness (generalized)  Rationale for Evaluation and Treatment Rehabilitation  SUBJECTIVE:                                                                                                                                                                                               SUBJECTIVE STATEMENT: Has hx of cognitive difficulty since COVID, but feels it's improving.  Went to church and spent a lot of time visiting, got home from church and starting weeding/came inside and sat sideways on the loveseat to rest.  When I went to stand up,  with very bad pain in R back and hip.  This happened in May.  Started on prednisone; it helped a little.  Has gotten some better since the initial injury. Pt accompanied by: significant other-provides and supplements history  PERTINENT HISTORY: chronic L shoulder pain, bilateral greater trochanteric bursitis, cognitive changes noted at 09/10/21 MD visit), fibromyalgia, mitral valve prolaps  PAIN:  Are you having pain? Yes: NPRS scale: 5/10 Pain location: R buttock-into R posterior thigh Pain description: sharp, sore, achy Aggravating factors: certain movements, certain times of the day Relieving factors: heating pad/ice pack  PRECAUTIONS: Fall  WEIGHT BEARING RESTRICTIONS No  FALLS: Has patient fallen in last 6 months? No  LIVING ENVIRONMENT: Lives with: lives with their family and lives with their spouse Lives in: House/apartment Stairs: Yes: Internal: 12 steps; on left going up Has following equipment at home: None  PLOF: Independent and Independent with household mobility without device  PATIENT GOALS To get better.  To feel like I used to.  OBJECTIVE:   DIAGNOSTIC FINDINGS: See MRI results  COGNITION: Overall cognitive status: Impaired: see notes in chart-speech therapy referral on chart.    MUSCLE LENGTH: Hamstrings: Right -38 deg from full extension; Left -30 deg from full extension SLR:  LLE 68, 52 RLE prior to onset of pain  POSTURE: rounded shoulders  LOWER EXTREMITY ROM:   See above   LOWER EXTREMITY MMT:    MMT Right Eval Left Eval  Hip flexion 3+/5 with pain 4/5  Hip extension    Hip abduction     Hip adduction    Hip internal rotation    Hip external rotation    Knee flexion 3/5 (at least-guarded due to discomfort) 5/5  Knee extension 3/5 (at least-guarded due to discomfort) 5/5  Ankle dorsiflexion 4/5 5/5  Ankle plantarflexion    Ankle inversion    Ankle eversion    (Blank rows = not tested)  BED MOBILITY:  Sit to supine SBA Supine to sit SBA Guarded with motion:  goes sit>long sit to supine TRANSFERS: Assistive device utilized: None  Sit to stand: SBA Stand to sit: SBA Guarded with motion  GAIT: Gait pattern: step through pattern and antalgic Distance walked: 40 ft x 2 Assistive device utilized: None Level of assistance: SBA Comments: Slowed, guarded gait pattern; slow to initiate gait  FUNCTIONAL TESTs:  Palpation:  Tender to palpation along paraspinal musculature R lumbar spine. Functional movements:  With [facility] (lumbar flexion), pt reports decreased radicular symptoms into R thigh/more centralized symptoms. Pain with SLR test in R buttocks into R posterior thigh.   TODAY'S TREATMENT:   Initiated HEP-see below.  Pt performs exercises below with cues for slowed pace, movement in painfree ranges. Access Code: [email or web address] URL: [email or web address] Date: 09/13/2021 Prepared by: [facility] - Outpatient  Rehab - [facility]  Exercises - Hooklying Single Knee to Chest Stretch  - 1-2 x daily - 5 x weekly - 1 sets - 3 reps - 15 sec hold - Supine Posterior Pelvic Tilt  - 1-2 x daily - 7 x weekly - 1 sets - 10 reps - Supine Lower Trunk Rotation  - 1-2 x daily - 7 x weekly - 1 sets - 10 reps   PATIENT EDUCATION: Education details: PT eval results, POC, initiated HEP Person educated: Patient and Spouse Education method: Explanation, Demonstration, Verbal cues, and Handouts Education comprehension: verbalized understanding, returned demonstration, verbal cues required, needs further education, and will need husband's assist/supervision due to hx of  memory issues  HOME EXERCISE PROGRAM: [employer] Access Code [email or web address]    GOALS: Goals reviewed with patient? Yes  SHORT TERM GOALS:  (Equal LTGs)  LONG TERM GOALS: Target date: 10/12/2021  Pt will perform HEP with supervision for decreased pain, improved functional mobility. Baseline: Pain rating at eval 5/10 with radiating pain into R posterior thigh Goal status: INITIAL  2.  Pt will report at least 50% reduction in low back and radicular pain for improved overall functional mobiltiy. Baseline:  Goal status: INITIAL  3.  Pt will improve RLE strength to at least 4/5 to improve ability for gait and transfers. Baseline:  Goal status: INITIAL 4.  Pt/husband will verbalize understanding of posture/body mechanics to reduce future injury and reduce pain.  Baseline:  Goal status:  INITIAL  ASSESSMENT:  CLINICAL IMPRESSION: Patient is a 76 y.o. female who was seen today for physical therapy evaluation and treatment for low back pain with R sided sciatica.  Pt has had onset of this acute back pain for approximately one month, which occurred after getting up quickly from lying down position.  She has had course of prednisone, with some relief and is referred to OPPT.  Pt presents with decreased flexibility, decreased strength, guarded funcitonal mobility, antalgic gait, pain with SLR measure.  With gentle flexion movements, she reports centralization of symptoms momentarily.  She will benefit from skilled PT to improve pain and allow for improved return to independent functional mobility.   OBJECTIVE IMPAIRMENTS decreased mobility, difficulty walking, decreased ROM, decreased strength, impaired flexibility, postural dysfunction, and pain.   ACTIVITY LIMITATIONS bending, sitting, standing, squatting, transfers, and locomotion level  PARTICIPATION LIMITATIONS: shopping, community activity, and church  PERSONAL FACTORS chronic L shoulder pain, bilateral greater trochanteric bursitis,  cognitive changes noted at 09/10/21 MD visit), fibromyalgia, mitral valve prolapse, hx of COVID are also affecting patient's functional outcome.   REHAB POTENTIAL: Good  CLINICAL DECISION MAKING: Evolving/moderate complexity  EVALUATION COMPLEXITY: Moderate  PLAN: PT FREQUENCY: 2x/week  PT DURATION: 4 weeks plus eval  PLANNED INTERVENTIONS: Therapeutic exercises, Therapeutic activity, Neuromuscular re-education, Gait training, Patient/Family education, Joint mobilization, Electrical stimulation, Cryotherapy, Moist heat, Ultrasound, and Manual therapy  PLAN FOR NEXT SESSION: Review initial HEP; work on exercises that centralize pain into back and out of RLE.  Core stabilization; modalities/manual traction?   Educate on posture/positioning    Lailany Enoch W., PT 09/14/2021, 3:51 PM

## 2021-09-14 ENCOUNTER — Encounter: Payer: Self-pay | Admitting: Physical Therapy

## 2021-09-15 ENCOUNTER — Ambulatory Visit: Payer: Medicare Other | Admitting: Speech Pathology

## 2021-09-15 ENCOUNTER — Encounter: Payer: Self-pay | Admitting: Speech Pathology

## 2021-09-15 DIAGNOSIS — R41841 Cognitive communication deficit: Secondary | ICD-10-CM

## 2021-09-15 DIAGNOSIS — M6281 Muscle weakness (generalized): Secondary | ICD-10-CM | POA: Diagnosis not present

## 2021-09-15 DIAGNOSIS — M5441 Lumbago with sciatica, right side: Secondary | ICD-10-CM | POA: Diagnosis not present

## 2021-09-15 NOTE — Therapy (Signed)
OUTPATIENT SPEECH LANGUAGE PATHOLOGY EVALUATION   Patient Name: Dawn Simmons MRN: [id number] DOB:1945-09-01, 76 y.o., female Today's Date: 09/15/2021  PCP: Bradd Canary, MD REFERRING PROVIDER: Rodolph Bong, MD   End of Session - 09/15/21 1453     Visit Number 1    Number of Visits 17    Date for SLP Re-Evaluation 11/15/21    SLP Start Time 1449    SLP Stop Time  1530    SLP Time Calculation (min) 41 min             Past Medical History:  Diagnosis Date   H/O fibromyalgia    H/O insomnia    H/O measles    H/O rubella    H/O syncope    History of chicken pox 09/15/2019   History of palpitations    Hx of mumps    Mitral valve prolapse    Palpitations    Syncope    Von Willebrand disease (HCC)    Past Surgical History:  Procedure Laterality Date   NO PAST SURGERIES     Patient Active Problem List   Diagnosis Date Noted   Low back pain 09/08/2021   Allergies 08/04/2020   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    REFERRING DIAG: R41.0 (ICD-10-CM) - Subacute confusional state   THERAPY DIAG:  Cognitive communication deficit  Rationale for Evaluation and Treatment Rehabilitation  SUBJECTIVE:   SUBJECTIVE STATEMENT: Pt was pleasant and cooperative throughout assessment. Pt accompanied by: significant other  PERTINENT HISTORY: Von Willebrand's Disease, COVID-19 [place]  PAIN:  Are you having pain? Yes: NPRS scale: 3/10 Pain location: Lower back Pain description: aching Aggravating factors: standing  Relieving factors:  PT   FALLS: Has patient fallen in last 6 months?  No  LIVING ENVIRONMENT: Lives with: lives with their family Lives in: House/apartment  PLOF:  Level of assistance: Independent with ADLs, Independent with IADLs Employment: Retired    PATIENT GOALS:  to work on memory  OBJECTIVE:   DIAGNOSTIC FINDINGS:   (2021): Narrative & Impression CLINICAL DATA:  Right-sided weakness, transient ischemic attack.   EXAM: CT HEAD WITHOUT CONTRAST   TECHNIQUE: Contiguous axial images were obtained from the base of the skull through the vertex without intravenous contrast.   COMPARISON:  None.   FINDINGS: Brain: No evidence of acute infarction, hemorrhage, hydrocephalus, extra-axial collection or mass lesion/mass effect.   Vascular: No hyperdense vessel or unexpected calcification.   Skull: Normal. Negative for fracture or focal lesion.   Sinuses/Orbits: No acute finding.   Other: None.   IMPRESSION: Normal head CT.    COGNITION: Overall cognitive status: Impaired: Attention: Impaired: Selective, Alternating, Divided and Memory: Impaired: Short term  COGNITIVE COMMUNICATION Following directions: Follows one step commands consistently  Auditory comprehension: WFL Verbal expression: WFL Functional communication: Impaired: Requires repetition and redirection due to poor topic maintenance.   ORAL MOTOR EXAMINATION WFL  STANDARDIZED ASSESSMENTS: CLQT: Attention: Mild, Memory: WNL, Executive Function: WNL, Language: Mild, Visuospatial Skills: WNL, and Clock  Drawing: WNL   PATIENT REPORTED OUTCOME MEASURES (PROM): To complete next session.    PATIENT EDUCATION: Education details: Cognitive-comm impairment Person educated: Patient and Spouse Education method: Medical illustrator Education comprehension: verbalized understanding and needs further education     GOALS: Goals reviewed with patient? Yes  SHORT TERM GOALS: Target date: 10/13/2021    Pt will  verbalize 3 memory strategies to assist with recall of important information across 3 sessions. Baseline: Goal status: INITIAL  2.  Pt will verbalize 3 attention strategies to increase focus and comprehension during conversations across 3 sessions.  Baseline:  Goal status: INITIAL  3.  Pt will complete cognitive PROMS Baseline:  Goal status: INITIAL   LONG TERM GOALS: Target date: 11/10/2021    Pt will demonstrate and/or report successful use of memory strategies to recall important information. Baseline:  Goal status: INITIAL  2.  Pt will demonstrate and/or report successful use of 3 attention strategies to increase focus comprehension during conversations. Baseline:  Goal status: INITIAL    ASSESSMENT:  CLINICAL IMPRESSION: Pt is a 76 yo female who presents to ST OP for evaluation with diagnosis of COVID long-hauler. Pt reports experiencing changes memory and attention over the past few years and attributes them to COVID. Received CT scan in 2021 which was unremarkable. Pt endorses significant anxiety and PTSD which she is receiving treatment for.  Pt was assessed using CLQT. See above for details. Throughout assessment, she required redirection and repetition due to impairments in topic maintenance. SLP brought these to pt's attention and explained that SLP redirections were not because SLP is disregarding her thoughts, but that redirections are to increase awareness of deviation from topic. Pt agreed she struggles with this and was accepting of redirections. SLP suspects memory is being impacted by attention. SLP rec skilled ST services to address cognitive-communication impairment to increase confidence and maximize functional communication.    OBJECTIVE IMPAIRMENTS include attention and memory. These impairments are limiting patient from managing medications, managing appointments, managing finances, and effectively communicating at home and in community. Factors affecting  potential to achieve goals and functional outcome are  levels of anxiety .Marland Kitchen Patient will benefit from skilled SLP services to address above impairments and improve overall function.  REHAB POTENTIAL: Good  PLAN: SLP FREQUENCY: 2x/week  SLP DURATION: 8 weeks  PLANNED INTERVENTIONS: Environmental controls, Cueing hierachy, Internal/external aids, Functional tasks, SLP instruction and feedback, Compensatory strategies, and Patient/family education    [place], CCC-SLP 09/15/2021, 2:54 PM

## 2021-09-16 ENCOUNTER — Ambulatory Visit (INDEPENDENT_AMBULATORY_CARE_PROVIDER_SITE_OTHER): Payer: Medicare Other | Admitting: Psychology

## 2021-09-16 DIAGNOSIS — F4310 Post-traumatic stress disorder, unspecified: Secondary | ICD-10-CM

## 2021-09-17 NOTE — Progress Notes (Signed)
[facility] Counselor/Therapist Progress Note  Patient ID: Dawn Simmons, MRN: [id number],    Date: 09/16/2021  Time Spent: 60 minutes  Treatment Type: Individual Therapy  Reported Symptoms: anxiety and depression and memory loss  Mental Status Exam: Appearance:  Casual     Behavior: Appropriate  Motor: Normal  Speech/Language:  Normal Rate  Affect: Blunt  Mood: pleasant  Thought process: normal  Thought content:   WNL  Sensory/Perceptual disturbances:   WNL  Orientation: oriented to person, place, time/date, and situation  Attention: Good  Concentration: Good  Memory: WNL  Fund of knowledge:  Good  Insight:   Good  Judgment:  Good  Impulse Control: Good   Risk Assessment: Danger to Self:  No Self-injurious Behavior: No Danger to Others: No Duty to Warn:no Physical Aggression / Violence:No  Access to Firearms a concern: No  Gang Involvement:No   Subjective: The patient attended a face-to-face individual therapy session in the office today.  The patient presents with a blunted affect and mood is pleasant.  The patient reports that she still does not know what is happening with her grandson.  Apparently his other grandfather took him to [place] for the funeral of his mother and they have not returned to get a DNA test from him.  The patient is anxious about finding out whether her son is the actual father.  We talked about her continuing to do what she needs to do to stay calm and not jump to conclusions.  The patient is planning to go to a family reunion next weekend and is excited about this.  The patient is doing much better and is not having any fainting spells and has not had any for quite some time.  We continue to work on keeping her level and managing her emotions in a better way.   Interventions: Cognitive Behavioral Therapy and Eye Movement Desensitization and Reprocessing (EMDR)  Diagnosis:PTSD (post-traumatic stress disorder)  Plan: Treatment  Plan  Strengths/Abilities:Insightful, motivated, supportive husband  Treatment Preferences: Outpatient Individual therapy  Statement of Needs: "I have a problem passing out and I was told I have PTSD"    Symptoms:  Demonstrates an exaggerated startle response.:(Status: improved). Depressed  or irritable mood.: (Status: improved). Describes a reliving of the event,  particularly through dissociative flashbacks.:  (Status: improved). Displays a  significant decline in interest and engagement in activities.: (Status: improved). Displays significant psychological and/or physiological distress resulting from internal and external  clues that are reminiscent of the traumatic event.: (Status: improved).  Experiences disturbances in sleep.: (Status: improved). Experiences disturbing  and persistent thoughts, images, and/or perceptions of the traumatic event.: (Status: improved). Experiences frequent nightmares.: (Status: improved).  Feelings of hopelessness, worthlessness, or inappropriate guilt.: (Status:  improved). Has been exposed to a traumatic event involving actual or perceived threat of death or  serious injury.: (Status: maintained). Impairment in social, occupational, or  other areas of functioning.: (Status: improved). Intentionally avoids activities,  places, people, or objects (e.g., up-armored vehicles) that evoke memories of the event.:(Status: maintained). Intentionally avoids thoughts, feelings, or discussions related  to the traumatic event.: (Status: improved). Reports difficulty concentrating as  well as feelings of guilt.:  (Status: improved). Reports response of intense fear,  helplessness, or horror to the traumatic event.:  (Status: improved).  Problems Addressed: PTSD   Goals:  LTG:  1. Develop healthy thinking patterns and beliefs about self, others, and the world that lead to the alleviation and help prevent the relapse of  depression.  40% Objective Identify and  replace thoughts and beliefs that support depression.  40 % Target Date: 2022-05-01 Frequency: Weekly Progress: 40 Modality: individual 2. Eliminate or reduce the negative impact trauma related symptoms have  on social, occupational, and family functioning.  50% Objective Learn and implement personal skills to manage challenging situations related to trauma. Target Date: 2022-05-01 Frequency: Weekly Progress: 30 Modality: individual3. No longer avoids persons, places, activities, and objects that are  reminiscent of the traumatic event. Objective 3.  Participate in Eye Movement Desensitization and Reprocessing (EMDR) to reduce emotional distress  related to traumatic thoughts, feelings, and images. Target Date: 2022-05-01 Frequency: Weekly Progress: 30 Modality: individual 4.  Learn and implement guided self-dialogue to manage thoughts, feelings, and urges brought on by  encounters with trauma-related situations. Target Date: 2022-05-01 Frequency: Weekly Progress: 40 Modality: individual 5.  No longer experiences intrusive event recollections, avoidance of event  reminders, intense arousal, or disinterest in activities or  relationships. Target Date:05/01/2022  Frequency : Weekly Progress 30    Modality: individual 6.Thinks about or openly discusses the traumatic event with others  without experiencing psychological or physiological distress. Target Date : 05/01/2022 Frequency: Weekly Progress:30   Modality: Individual Interventions by Therapist:  CBT, problem solving therapy, EMDR, insight oriented.  Patient approved Treatment plan.  Tyreke Kaeser G Enrika Aguado, LCSW                                     Leyani Gargus G Paityn Balsam, LCSW               Arman Loy G Khair Chasteen, LCSW               Jeyli Zwicker G Henning Ehle, LCSW               Ilyse Tremain G Malory Spurr, LCSW               Jamarie Joplin G Dedria Endres, LCSW               Merrick Maggio [employer],  LCSW               Chancelor Hardrick [employer], LCSW               Anthone Prieur [employer], LCSW               Shawnique Mariotti [employer], LCSW               Jaiven Graveline G Ginna Schuur, LCSW               Gertude Benito G Kadden Osterhout, LCSW               Ashlynn Gunnels G Sedale Jenifer, LCSW               Camil Wilhelmsen G Baylen Buckner, LCSW               Joseantonio Dittmar G Onika Gudiel, LCSW               Humberto Addo G Kingsten Enfield, LCSW               Corrie Brannen G Kaysin Brock, LCSW               Azaya Goedde G Mehtab Dolberry, LCSW               Tyronne Blann G Logyn Dedominicis, LCSW               Orena Cavazos G Kaya Klausing, LCSW

## 2021-09-20 ENCOUNTER — Ambulatory Visit: Payer: Medicare Other | Admitting: Physical Therapy

## 2021-09-20 ENCOUNTER — Encounter: Payer: Self-pay | Admitting: Physical Therapy

## 2021-09-20 DIAGNOSIS — M6281 Muscle weakness (generalized): Secondary | ICD-10-CM | POA: Diagnosis not present

## 2021-09-20 DIAGNOSIS — M5441 Lumbago with sciatica, right side: Secondary | ICD-10-CM

## 2021-09-20 DIAGNOSIS — R41841 Cognitive communication deficit: Secondary | ICD-10-CM | POA: Diagnosis not present

## 2021-09-20 NOTE — Therapy (Signed)
OUTPATIENT PHYSICAL THERAPY TREATMENT NOTE   Patient Name: Dawn Simmons MRN: [id number] DOB:Aug 03, 1945, 76 y.o., female Today's Date: 09/20/2021  PCP: Joaquin CourtsStacey Blythe, MD REFERRING PROVIDER: Clementeen GrahamEvan Corey, MD  END OF SESSION:   PT End of Session - 09/20/21 1718     Visit Number 2    Number of Visits 9    Date for PT Re-Evaluation 10/12/21    Authorization Type Medicare    PT Start Time 1534    PT Stop Time 1615    PT Time Calculation (min) 41 min    Activity Tolerance Patient limited by pain    Behavior [facility] for tasks assessed/performed             Past Medical History:  Diagnosis Date   H/O fibromyalgia    H/O insomnia    H/O measles    H/O rubella    H/O syncope    History of chicken pox 09/15/2019   History of palpitations    Hx of mumps    Mitral valve prolapse    Palpitations    Syncope    Von Willebrand disease (HCC)    Past Surgical History:  Procedure Laterality Date   NO PAST SURGERIES     Patient Active Problem List   Diagnosis Date Noted   Low back pain 09/08/2021   Allergies 08/04/2020   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   Right groin pain 01/10/2020   Right sided weakness 01/10/2020   Left shoulder pain 10/31/2019   Leukopenia 09/15/2019   PTSD (post-traumatic stress disorder) 09/15/2019   History of chicken pox 09/15/2019   Von Willebrand disease (HCC)    Upper airway cough syndrome 09/06/2018   Fibromyalgia 06/01/2015   Insomnia 06/01/2015   H/O syncope    Palpitations    Mitral valve prolapse     REFERRING DIAG: M54.41 (ICD-10-CM) - Acute right-sided low back pain with right-sided sciatica  THERAPY DIAG:  Acute right-sided low back pain with right-sided sciatica  Rationale for Evaluation and Treatment Rehabilitation  PERTINENT HISTORY:  chronic L shoulder pain, bilateral greater trochanteric bursitis, cognitive changes noted at 09/10/21 MD visit), fibromyalgia, mitral valve prolapse  PRECAUTIONS: Fall  SUBJECTIVE: Some  periods of better and worse periods.  Do the ice during the day and heat during the night.  Tried the exercises.  PAIN:  Are you having pain? Yes: NPRS scale: 5/10 Pain location: buttocks Pain description: achy Aggravating factors: sitting too long aggravates Relieving factors: ice, heat   OBJECTIVE:   TODAY'S TREATMENT: 09/14/2021 Activity Comments  Review of HEP-hooklying SKTC, trunk rotation rocking>stretch, then posterior pelvic tilt Pt c/o pain going up through spine all the way, with posterior pelvic tilts, so d/c this exercise; other exercises, pt performs with min cues and pictures  Supine hamstring stretch with strap, RLE x 3 reps, 15 sec  With assist to hold in place  Anterior pelvic tilt, 5 reps x 2 sets Initial cues for technique; no c/o pain  Piriformis stretch in supine, RLE and LLE, x 2 reps each side Pt c/o increased discomfort throughout lateral R leg  Seated hamstring stretch-attempted sitting on mat, then on chair PT provides manual cues and assistance to achieve position; pt c/o pain at variable places in each leg with hamstring stretch (thoracic spine, gastroc area); pt is guarded throughout motion and PT attempted to instruct in correct motion/technique  Upon standing, lateral weightshifting/rocking to lessen pain and prepare for movement No c/o pain     Discussed posture  and positioning for bed mobility:  getting in and out of bed using log roll technique; squatting for flower gardening tasks with neutral spine Cautioned pt against multiple repetitive movements (reaching to garden, squatting with gardening) to try to lessen back pain and not aggravate    PATIENT EDUCATION: Education details: Updated HEP (added ant pelvic tilt and took out posterior tilt) Person educated: Patient and Spouse Education method: Explanation, Demonstration, Verbal cues, and Handouts Education comprehension: verbalized understanding, returned demonstration, and needs further education    HEP as of 09/20/2021:  Access Code: [email or web address] URL: [email or web address] Date: 09/13/2021 Prepared by: [facility] - Outpatient  Rehab - [facility]   Exercises - Hooklying Single Knee to Chest Stretch  - 1-2 x daily - 5 x weekly - 1 sets - 3 reps - 15 sec hold - Supine Posterior Pelvic Tilt  - 1-2 x daily - 7 x weekly - 1 sets - 10 reps - Supine Lower Trunk Rotation  - 1-2 x daily - 7 x weekly - 1 sets - 10 reps  _________________________________________________________________________ From EVAL:  (objective measures completed at initial evaluation unless otherwise dated)09/14/2021   DIAGNOSTIC FINDINGS: See MRI results   COGNITION: Overall cognitive status: Impaired: see notes in chart-speech therapy referral on chart.               MUSCLE LENGTH: Hamstrings: Right -38 deg from full extension; Left -30 deg from full extension SLR:  LLE 68, 52 RLE prior to onset of pain   POSTURE: rounded shoulders   LOWER EXTREMITY ROM:   See above     LOWER EXTREMITY MMT:     MMT Right Eval Left Eval  Hip flexion 3+/5 with pain 4/5  Hip extension      Hip abduction      Hip adduction      Hip internal rotation      Hip external rotation      Knee flexion 3/5 (at least-guarded due to discomfort) 5/5  Knee extension 3/5 (at least-guarded due to discomfort) 5/5  Ankle dorsiflexion 4/5 5/5  Ankle plantarflexion      Ankle inversion      Ankle eversion      (Blank rows = not tested)   BED MOBILITY:  Sit to supine SBA Supine to sit SBA Guarded with motion:  goes sit>long sit to supine TRANSFERS: Assistive device utilized: None  Sit to stand: SBA Stand to sit: SBA Guarded with motion   GAIT: Gait pattern: step through pattern and antalgic Distance walked: 40 ft x 2 Assistive device utilized: None Level of assistance: SBA Comments: Slowed, guarded gait pattern; slow to initiate gait   FUNCTIONAL TESTs:  Palpation:  Tender to palpation along paraspinal  musculature R lumbar spine. Functional movements:  With [facility] (lumbar flexion), pt reports decreased radicular symptoms into R thigh/more centralized symptoms. Pain with SLR test in R buttocks into R posterior thigh.     TODAY'S TREATMENT:   Initiated HEP-see below.  Pt performs exercises below with cues for slowed pace, movement in painfree ranges. Access Code: [email or web address] URL: [email or web address] Date: 09/13/2021 Prepared by: [facility] - Outpatient  Rehab - [facility]   Exercises - Hooklying Single Knee to Chest Stretch  - 1-2 x daily - 5 x weekly - 1 sets - 3 reps - 15 sec hold - Supine Posterior Pelvic Tilt  - 1-2 x daily - 7 x weekly - 1 sets - 10 reps - Supine Lower Trunk Rotation  - 1-2  x daily - 7 x weekly - 1 sets - 10 reps     PATIENT EDUCATION: Education details: PT eval results, POC, initiated HEP Person educated: Patient and Spouse Education method: Explanation, Demonstration, Verbal cues, and Handouts Education comprehension: verbalized understanding, returned demonstration, verbal cues required, needs further education, and will need husband's assist/supervision due to hx of memory issues     HOME EXERCISE PROGRAM: Med Bridge Access Code [email or web address]       GOALS: Goals reviewed with patient? Yes   SHORT TERM GOALS:  (Equal LTGs)   LONG TERM GOALS: Target date: 10/12/2021   Pt will perform HEP with supervision for decreased pain, improved functional mobility. Baseline: Pain rating at eval 5/10 with radiating pain into R posterior thigh Goal status: INITIAL   2.  Pt will report at least 50% reduction in low back and radicular pain for improved overall functional mobiltiy. Baseline:  Goal status: INITIAL   3.  Pt will improve RLE strength to at least 4/5 to improve ability for gait and transfers. Baseline:  Goal status: INITIAL 4.  Pt/husband will verbalize understanding of posture/body mechanics to reduce future injury and reduce pain.             Baseline:            Goal status:  INITIAL   ASSESSMENT:   CLINICAL IMPRESSION: Pt returns for first visit after eval, and reports some days/times are better than others in regards to pain.  During session today, worked on lumbar stretches and neutral spine positioning to attempt to lessen pain.  Pt has c/o pain multiple areas (not associated with the areas being stretched) throughout session, and overall remains guarded with mobility and with transitional movements.  She will continue to benefit from skilled PT towards goals for improved functional mobility and decreased pain.    OBJECTIVE IMPAIRMENTS decreased mobility, difficulty walking, decreased ROM, decreased strength, impaired flexibility, postural dysfunction, and pain.    ACTIVITY LIMITATIONS bending, sitting, standing, squatting, transfers, and locomotion level   PARTICIPATION LIMITATIONS: shopping, community activity, and church   PERSONAL FACTORS chronic L shoulder pain, bilateral greater trochanteric bursitis, cognitive changes noted at 09/10/21 MD visit), fibromyalgia, mitral valve prolapse, hx of COVID are also affecting patient's functional outcome.    REHAB POTENTIAL: Good   CLINICAL DECISION MAKING: Evolving/moderate complexity   EVALUATION COMPLEXITY: Moderate   PLAN: PT FREQUENCY: 2x/week   PT DURATION: 4 weeks plus eval   PLANNED INTERVENTIONS: Therapeutic exercises, Therapeutic activity, Neuromuscular re-education, Gait training, Patient/Family education, Joint mobilization, Electrical stimulation, Cryotherapy, Moist heat, Ultrasound, and Manual therapy   PLAN FOR NEXT SESSION: Review additions to HEP; work on exercises that centralize pain into back and out of RLE.  Core stabilization; modalities/manual traction?   Educate on posture/positioning.  Try standing weightshifting and stretching next visit.     Hunter Bachar W., PT 09/20/2021, 5:34 PM

## 2021-09-21 NOTE — Therapy (Signed)
OUTPATIENT PHYSICAL THERAPY TREATMENT NOTE   Patient Name: Dawn Simmons MRN: [id number] DOB:1945/10/02, 76 y.o., female Today's Date: 09/22/2021  PCP: Joaquin Courts, MD REFERRING PROVIDER: [name], MD  END OF SESSION:   PT End of Session - 09/22/21 1402     Visit Number 3    Number of Visits 9    Date for PT Re-Evaluation 10/12/21    Authorization Type Medicare    PT Start Time 1324   pt late   PT Stop Time 1400    PT Time Calculation (min) 36 min    Activity Tolerance Patient limited by pain    Behavior During Therapy [facility] for tasks assessed/performed              Past Medical History:  Diagnosis Date   H/O fibromyalgia    H/O insomnia    H/O measles    H/O rubella    H/O syncope    History of chicken pox 09/15/2019   History of palpitations    Hx of mumps    Mitral valve prolapse    Palpitations    Syncope    Von Willebrand disease (HCC)    Past Surgical History:  Procedure Laterality Date   NO PAST SURGERIES     Patient Active Problem List   Diagnosis Date Noted   Low back pain 09/08/2021   Allergies 08/04/2020   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   Right groin pain 01/10/2020   Right sided weakness 01/10/2020   Left shoulder pain 10/31/2019   Leukopenia 09/15/2019   PTSD (post-traumatic stress disorder) 09/15/2019   History of chicken pox 09/15/2019   Von Willebrand disease (HCC)    Upper airway cough syndrome 09/06/2018   Fibromyalgia 06/01/2015   Insomnia 06/01/2015   H/O syncope    Palpitations    Mitral valve prolapse     REFERRING DIAG: M54.41 (ICD-10-CM) - Acute right-sided low back pain with right-sided sciatica  THERAPY DIAG:  Acute right-sided low back pain with right-sided sciatica  Muscle weakness (generalized)  Rationale for Evaluation and Treatment Rehabilitation  PERTINENT HISTORY:  chronic L shoulder pain, bilateral greater trochanteric bursitis, cognitive changes noted at 09/10/21 MD visit), fibromyalgia, mitral valve  prolapse  PRECAUTIONS: Fall  SUBJECTIVE: Forgot that she had 2 appointment today. Husband reports compliance with HEP.  Patient is accompanied by husband.  PAIN:  Are you having pain? Yes: NPRS scale: 2-3/10 Pain location: buttocks Pain description: achy Aggravating factors: sitting too long aggravates Relieving factors: ice, heat   OBJECTIVE:     TODAY'S TREATMENT: 09/22/21 Activity Comments  Review of HEP:  SKTC 30" each LE posterior pelvic tilt 10x LTR 10x Cues for hand placement/positioning; cues to avoid pushing into pain   glute set 2 sets 5x5"   beginner bridge 2x10 Cues to concentrate on glute rather than HS contraction  sciatic nerve glide   Supine clam with red TB x10 C/o R buttock and back pain  Fig 4 stretch 30" Cues to avoid pushing into pain; c/o R LBP  prayer stretch with green pball forward/each diagonal 10x3"      HEP as of 09/20/2021: Access Code: [email or web address] URL: [email or web address] Date: 09/13/2021 Prepared by: [facility] - Outpatient  Rehab - [facility]   Exercises - Hooklying Single Knee to Chest Stretch  - 1-2 x daily - 5 x weekly - 1 sets - 3 reps - 15 sec hold - Supine Posterior Pelvic Tilt  - 1-2 x daily - 7 x weekly - 1  sets - 10 reps - Supine Lower Trunk Rotation  - 1-2 x daily - 7 x weekly - 1 sets - 10 reps  _________________________________________________________________________ From EVAL:  (objective measures completed at initial evaluation unless otherwise dated)09/14/2021   DIAGNOSTIC FINDINGS: See MRI results   COGNITION: Overall cognitive status: Impaired: see notes in chart-speech therapy referral on chart.               MUSCLE LENGTH: Hamstrings: Right -38 deg from full extension; Left -30 deg from full extension SLR:  LLE 68, 52 RLE prior to onset of pain   POSTURE: rounded shoulders   LOWER EXTREMITY ROM:   See above     LOWER EXTREMITY MMT:     MMT Right Eval Left Eval  Hip flexion 3+/5 with  pain 4/5  Hip extension      Hip abduction      Hip adduction      Hip internal rotation      Hip external rotation      Knee flexion 3/5 (at least-guarded due to discomfort) 5/5  Knee extension 3/5 (at least-guarded due to discomfort) 5/5  Ankle dorsiflexion 4/5 5/5  Ankle plantarflexion      Ankle inversion      Ankle eversion      (Blank rows = not tested)   BED MOBILITY:  Sit to supine SBA Supine to sit SBA Guarded with motion:  goes sit>long sit to supine TRANSFERS: Assistive device utilized: None  Sit to stand: SBA Stand to sit: SBA Guarded with motion   GAIT: Gait pattern: step through pattern and antalgic Distance walked: 40 ft x 2 Assistive device utilized: None Level of assistance: SBA Comments: Slowed, guarded gait pattern; slow to initiate gait   FUNCTIONAL TESTs:  Palpation:  Tender to palpation along paraspinal musculature R lumbar spine. Functional movements:  With [facility] (lumbar flexion), pt reports decreased radicular symptoms into R thigh/more centralized symptoms. Pain with SLR test in R buttocks into R posterior thigh.     TODAY'S TREATMENT:   Initiated HEP-see below.  Pt performs exercises below with cues for slowed pace, movement in painfree ranges. Access Code: [email or web address] URL: [email or web address] Date: 09/13/2021 Prepared by: [facility] - Outpatient  Rehab - [facility]   Exercises - Hooklying Single Knee to Chest Stretch  - 1-2 x daily - 5 x weekly - 1 sets - 3 reps - 15 sec hold - Supine Posterior Pelvic Tilt  - 1-2 x daily - 7 x weekly - 1 sets - 10 reps - Supine Lower Trunk Rotation  - 1-2 x daily - 7 x weekly - 1 sets - 10 reps     PATIENT EDUCATION: Education details: PT eval results, POC, initiated HEP Person educated: Patient and Spouse Education method: Explanation, Demonstration, Verbal cues, and Handouts Education comprehension: verbalized understanding, returned demonstration, verbal cues required, needs further  education, and will need husband's assist/supervision due to hx of memory issues     HOME EXERCISE PROGRAM: [employer] Access Code [email or web address]       GOALS: Goals reviewed with patient? Yes   SHORT TERM GOALS:  (Equal LTGs)   LONG TERM GOALS: Target date: 10/12/2021   Pt will perform HEP with supervision for decreased pain, improved functional mobility. Baseline: Pain rating at eval 5/10 with radiating pain into R posterior thigh Goal status: IN PROGRESS   2.  Pt will report at least 50% reduction in low back and radicular pain for improved overall functional mobiltiy. Baseline:  Goal status: IN PROGRESS   3.  Pt will improve RLE strength to at least 4/5 to improve ability for gait and transfers. Baseline:  Goal status: IN PROGRESS 4.  Pt/husband will verbalize understanding of posture/body mechanics to reduce future injury and reduce pain.            Baseline:            Goal status:  IN PROGRESS   ASSESSMENT:   CLINICAL IMPRESSION: Patient arrived to session with husband without new complaints. Reviewed HEP while providing cues for positioning for max comfort. Worked on progressing core strengthening while trying to maintaining patient relatively pain-free. Patient with limited amplitude and slow movements throughout.  Patient reported poor tolerance of gentle hip stretching, thus it was discontinued. Patient reports 7/10 pain at end of session. Patient declined heat for LBP and instead said that she would take a walk outside.    OBJECTIVE IMPAIRMENTS decreased mobility, difficulty walking, decreased ROM, decreased strength, impaired flexibility, postural dysfunction, and pain.    ACTIVITY LIMITATIONS bending, sitting, standing, squatting, transfers, and locomotion level   PARTICIPATION LIMITATIONS: shopping, community activity, and church   PERSONAL FACTORS chronic L shoulder pain, bilateral greater trochanteric bursitis, cognitive changes noted at 09/10/21 MD visit),  fibromyalgia, mitral valve prolapse, hx of COVID are also affecting patient's functional outcome.    REHAB POTENTIAL: Good   CLINICAL DECISION MAKING: Evolving/moderate complexity   EVALUATION COMPLEXITY: Moderate   PLAN: PT FREQUENCY: 2x/week   PT DURATION: 4 weeks plus eval   PLANNED INTERVENTIONS: Therapeutic exercises, Therapeutic activity, Neuromuscular re-education, Gait training, Patient/Family education, Joint mobilization, Electrical stimulation, Cryotherapy, Moist heat, Ultrasound, and Manual therapy   PLAN FOR NEXT SESSION: Review additions to HEP; work on exercises that centralize pain into back and out of RLE.  Core stabilization; modalities/manual traction?   Educate on posture/positioning.  Try standing weightshifting and stretching next visit.     Anette GuarneriYevgeniya Marieme Mcmackin, PT, DPT 09/22/21 2:04 PM  [place] [facility] [address], [address] [place], [place] [address] Phone # [phone] Fax # [phone]

## 2021-09-22 ENCOUNTER — Ambulatory Visit: Payer: Medicare Other | Admitting: Physical Therapy

## 2021-09-22 ENCOUNTER — Encounter: Payer: Self-pay | Admitting: Speech Pathology

## 2021-09-22 ENCOUNTER — Ambulatory Visit (INDEPENDENT_AMBULATORY_CARE_PROVIDER_SITE_OTHER): Payer: Medicare Other | Admitting: Psychiatry

## 2021-09-22 ENCOUNTER — Encounter: Payer: Self-pay | Admitting: Physical Therapy

## 2021-09-22 ENCOUNTER — Encounter: Payer: Self-pay | Admitting: Psychiatry

## 2021-09-22 ENCOUNTER — Ambulatory Visit: Payer: Medicare Other | Admitting: Speech Pathology

## 2021-09-22 DIAGNOSIS — G4700 Insomnia, unspecified: Secondary | ICD-10-CM

## 2021-09-22 DIAGNOSIS — M6281 Muscle weakness (generalized): Secondary | ICD-10-CM

## 2021-09-22 DIAGNOSIS — R41841 Cognitive communication deficit: Secondary | ICD-10-CM

## 2021-09-22 DIAGNOSIS — M5441 Lumbago with sciatica, right side: Secondary | ICD-10-CM

## 2021-09-22 DIAGNOSIS — F4310 Post-traumatic stress disorder, unspecified: Secondary | ICD-10-CM

## 2021-09-22 MED ORDER — FLUOXETINE HCL 10 MG PO CAPS: 20.0000 mg | ORAL_CAPSULE | Freq: Every morning | ORAL | 0 refills | Status: DC

## 2021-09-22 MED ORDER — DIAZEPAM 5 MG PO TABS: 5.0000 mg | ORAL_TABLET | Freq: Every day | ORAL | 0 refills | Status: DC

## 2021-09-22 NOTE — Progress Notes (Signed)
Dawn Simmons [id number] Jan 04, 1946 76 y.o.  Subjective:   Patient ID:  Dawn Simmons is a 76 y.o. (DOB 03-Nov-1945) female.  Chief Complaint:  Chief Complaint  Patient presents with   [place] presents to the office today for follow-up of anxiety, depression, and insomnia. She reports that she has been using nicotine patches "to try to get my brain back." She reports that it seemed to be helpful for the first week and then had an episode of sudden severe back pain while resting. She was prescribed Prednisone for 5 days. She reports that she is continuing to have back pain. She was referred to PT and for cognitive rehab with speech therapy. She reports that she had testing when she went to cognitive rehab "and I did terribly bad" and caused her anxiety to the point of shaking. She has worried about how she performed on her testing for the last week. She reports that she has vivid dreams.   Sleep has improved with Diazepam.   She planning to go to a family reunion in [place] this week.   She reports that her favorite 75 year old rose bush has had a virus and this has been very upsetting for her- "that's my life." She has had some sadness in response to this. Conflict with neighbors also affects her mood. Appetite has been ok. Denies SI.   Continues to see Caroline Sauger, LCSW for therapy.   Past Psychiatric Medication Trials: (She reports that she is very sensitive to medication) Diazepam- Prescribed as need with limited improvement.  Prozac- reports that she has to take Prozac 20 mg in divided doses.   [place] Office Visit from 09/07/2021 in [facility] [place] from 02/08/2021 in [facility] at [place] Visit from 01/25/2021 in [facility] at [place] from 12/13/2019 in [facility] at [place]  PHQ-2 Total Score 2 1 2  0  PHQ-9 Total Score 9 -- 7 --        Review of Systems:  Review of Systems  Musculoskeletal:  Positive for back pain. Negative for gait problem.  Psychiatric/Behavioral:         Please refer to HPI    Medications: I have reviewed the patient's current medications.  Current Outpatient Medications  Medication Sig Dispense Refill   Ascorbic Acid (VITAMIN C) 100 MG tablet Take 100 mg by mouth daily.     Calcium-Magnesium 200-100 MG TABS Take 4 tablets by mouth daily. 2 tablets in the morning and 2 tablets in the evening     diazepam (VALIUM) 5 MG tablet Take 1 tablet (5 mg total) by mouth at bedtime. Take 1/2-1 tablet po QHS 90 tablet 0   ergocalciferol (DRISDOL) 200 MCG/ML drops Take by mouth daily. PURE brand     FLUoxetine (PROZAC) 10 MG capsule Take 2 capsules (20 mg total) by mouth every morning. 180 capsule 0   loratadine (CLARITIN) 10 MG tablet Take 10 mg by mouth daily.     MAGNESIUM PO Take 200 mg by mouth at bedtime.     meloxicam (MOBIC) 15 MG tablet Take 1 tablet (15 mg total) by mouth daily. (Patient not taking: Reported on 09/14/2021) 30 tablet 0   Multiple Vitamin (MULTIVITAMIN) capsule Take 1 capsule by mouth daily.     nystatin (MYCOSTATIN) 100000 UNIT/ML  suspension Apply to corners of mouth three x daily 60 mL 1   Omega-3 1000 MG CAPS Take by mouth.     tiZANidine (ZANAFLEX) 4 MG tablet Take 1 tablet (4 mg total) by mouth every 6 (six) hours as needed for muscle spasms. (Patient not taking: Reported on 09/14/2021) 30 tablet 0   triamcinolone cream (KENALOG) 0.1 % Apply 1 application topically 2 (two) times daily. 80 g 1   TURMERIC PO Take by mouth.     No current facility-administered medications for this visit.    Medication Side Effects: None  Allergies:  Allergies  Allergen Reactions   Doxycycline Swelling   Tetanus Toxoids Other (See Comments)    PAIN IN NECK AND FACE   Flexeril [Cyclobenzaprine] Other (See Comments)     Past Medical History:  Diagnosis Date   H/O fibromyalgia    H/O insomnia    H/O measles    H/O rubella    H/O syncope    History of chicken pox 09/15/2019   History of palpitations    Hx of mumps    Mitral valve prolapse    Palpitations    Syncope    Von Willebrand disease ([place])     Past Medical History, Surgical history, Social history, and Family history were reviewed and updated as appropriate.   Please see review of systems for further details on the patient's review from today.   Objective:   Physical Exam:  Wt 124 lb (56.2 kg)   BMI 20.01 kg/m   Physical Exam Constitutional:      General: She is not in acute distress. Musculoskeletal:        General: No deformity.  Neurological:     Mental Status: She is alert and oriented to person, place, and time.     Coordination: Coordination normal.  Psychiatric:        Attention and Perception: Attention and perception normal. She does not perceive auditory or visual hallucinations.        Mood and Affect: Mood is anxious. Mood is not depressed. Affect is not labile, blunt, angry or inappropriate.        Speech: Speech normal.        Behavior: Behavior normal.        Thought Content: Thought content normal. Thought content is not paranoid or delusional. Thought content does not include homicidal or suicidal ideation. Thought content does not include homicidal or suicidal plan.        Cognition and Memory: Cognition is impaired.        Judgment: Judgment normal.     Comments: Insight fair     Lab Review:     Component Value Date/Time   NA 140 01/25/2021 1504   K 4.0 01/25/2021 1504   CL 106 01/25/2021 1504   CO2 25 01/25/2021 1504   GLUCOSE 88 01/25/2021 1504   BUN 17 01/25/2021 1504   CREATININE 0.88 01/25/2021 1504   CREATININE 0.97 (H) 01/10/2020 1358   CALCIUM 10.0 01/25/2021 1504   PROT 6.9 01/25/2021 1504   ALBUMIN 4.6 01/25/2021 1504   AST 23 01/25/2021 1504   ALT 15 01/25/2021 1504   ALKPHOS 67  01/25/2021 1504   BILITOT 0.6 01/25/2021 1504       Component Value Date/Time   WBC 4.0 01/25/2021 1504   RBC 4.08 01/25/2021 1504   HGB 12.9 01/25/2021 1504   HCT 38.1 01/25/2021 1504   PLT 272.0 01/25/2021 1504   MCV 93.4 01/25/2021 1504  MCH 31.3 01/10/2020 1358   MCHC 33.9 01/25/2021 1504   RDW 13.1 01/25/2021 1504   LYMPHSABS 1,197 01/10/2020 1358   MONOABS 0.5 10/31/2019 1509   EOSABS 198 01/10/2020 1358   BASOSABS 32 01/10/2020 1358    No results found for: "POCLITH", "LITHIUM"   No results found for: "PHENYTOIN", "PHENOBARB", "VALPROATE", "CBMZ"   .res Assessment: Plan:    Pt seen for 30 minutes and time spent discussing recent stressors and back pain and the effect this has had on her anxiety and insomnia. She reports that she prefers to take the least amount of medication possible, however she feels that it is beneficial to continue Prozac and Diazepam at this time, particularly since she notices Diazepam is also helpful for her back pain/spasms.  She reports that she was prescribed Mobic and Tizanidine for her back pain and has not taken these, but may decide to take them during upcoming car trip to [place] for a family reunion. She asks about the safety of combining these medications with Prozac and Diazepam. Advised not to take Diazepam and Tizanidine together at the same time and otherwise she should be able to take these medications without regards to her other medications.  Will continue Prozac 20 mg po qd for anxiety and depression.  Continue Diazepam 5 mg po QHS for insomnia and anxiety.  Pt to follow-up in 2 months or sooner if clinically indicated.  Patient advised to contact office with any questions, adverse effects, or acute worsening in signs and symptoms.   Dawn Simmons was seen today for anxiety.  Diagnoses and all orders for this visit:  PTSD (post-traumatic stress disorder) -     FLUoxetine (PROZAC) 10 MG capsule; Take 2 capsules (20 mg total) by  mouth every morning. -     diazepam (VALIUM) 5 MG tablet; Take 1 tablet (5 mg total) by mouth at bedtime. Take 1/2-1 tablet po QHS  Insomnia, unspecified type -     diazepam (VALIUM) 5 MG tablet; Take 1 tablet (5 mg total) by mouth at bedtime. Take 1/2-1 tablet po QHS     Please see After Visit Summary for patient specific instructions.  Future Appointments  Date Time Provider [place]  09/22/2021  1:15 PM Janene Harvey D, PT [facility]  09/22/2021  2:45 PM Verdene Lennert, CCC-SLP [facility]  09/23/2021  1:00 PM Cottle, Bambi G, LCSW [facility] None  09/28/2021 12:30 PM Frazier Butt, PT [facility]  09/29/2021 11:00 AM Janene Harvey D, PT [facility]  09/29/2021 12:30 PM Verdene Lennert, CCC-SLP [facility]  09/30/2021  1:00 PM Cottle, Bambi G, LCSW [facility] None  10/04/2021  1:15 PM Sharen Counter, CCC-SLP [facility]  10/04/2021  2:00 PM Janene Harvey D, PT [facility]  10/06/2021  2:45 PM Janene Harvey D, PT [facility]  10/07/2021  1:00 PM Cottle, Lucious [place], LCSW [facility] None  10/11/2021  1:15 PM Frazier Butt, PT [facility]  10/11/2021  2:00 PM Sharen Counter, CCC-SLP [facility]  10/13/2021  3:30 PM Frazier Butt, PT [facility]  10/13/2021  4:15 PM Schinke, [place], CCC-SLP [facility]  10/14/2021  1:00 PM Cottle, Bambi G, LCSW [facility] None  10/20/2021  2:45 PM Verdene Lennert, CCC-SLP [facility]  10/21/2021  1:00 PM Cottle, Bambi G, LCSW [facility] None  10/22/2021 10:45 AM Gregor Hams, MD [facility] None  10/25/2021  2:45 PM Sharen Counter, CCC-SLP [facility]  10/27/2021  2:45 PM  Sharen Counter, CCC-SLP [facility]  10/28/2021  1:00 PM Cottle, Bambi G, LCSW [facility] None  11/01/2021  2:45 PM Sharen Counter, CCC-SLP [facility]  11/03/2021  3:30 PM Sharen Counter, CCC-SLP [facility]  11/04/2021  1:00 PM Cottle, Bambi G, LCSW [facility] None  11/08/2021  2:45 PM Sharen Counter, CCC-SLP [facility]   11/10/2021  2:45 PM Sharen Counter, CCC-SLP [facility]  11/11/2021  1:00 PM Cottle, Bambi G, LCSW [facility] None  11/18/2021  1:00 PM Cottle, Bambi G, LCSW [facility] None  11/25/2021  1:00 PM Cottle, Bambi G, LCSW [facility] None  11/30/2021  2:00 PM Mosie Lukes, MD [facility]  12/02/2021  1:00 PM Cottle, Bambi G, LCSW [facility] None  12/09/2021  1:00 PM Cottle, Bambi G, LCSW [facility] None  12/16/2021  1:00 PM Cottle, Bambi G, LCSW [facility] None  12/23/2021  1:00 PM Cottle, Bambi G, LCSW [facility] None  12/30/2021  1:00 PM Cottle, Bambi G, LCSW [facility] None  01/20/2022  1:00 PM Cottle, Bambi G, LCSW [facility] None  01/27/2022  1:00 PM Cottle, Bambi G, LCSW [facility] None  02/03/2022  1:00 PM Cottle, Bambi G, LCSW [facility] None  02/10/2022  1:00 PM Cottle, Bambi G, LCSW [facility] None  02/17/2022  1:00 PM Cottle, Bambi G, LCSW [facility] None  02/24/2022  1:00 PM Cottle, Bambi G, LCSW [facility] None  03/10/2022  1:00 PM Cottle, Bambi G, LCSW [facility] None  03/17/2022  1:00 PM Cottle, Bambi G, LCSW [facility] None  03/24/2022  1:00 PM Cottle, Bambi G, LCSW [facility] None  03/31/2022  1:00 PM Cottle, Bambi G, LCSW [facility] None  04/07/2022  1:00 PM Cottle, Bambi G, LCSW [facility] None    No orders of the defined types were placed in this encounter.   -------------------------------

## 2021-09-22 NOTE — Therapy (Signed)
OUTPATIENT SPEECH LANGUAGE PATHOLOGY TREATMENT NOTE   Patient Name: Dawn Simmons MRN: [id number] DOB:1945-08-27, 76 y.o., female Today's Date: 09/22/2021  PCP: Bradd Canary, MD REFERRING PROVIDER: Rodolph Bong, MD  END OF SESSION:   End of Session - 09/22/21 1453     Visit Number 2    Number of Visits 17    Date for SLP Re-Evaluation 11/15/21    SLP Start Time 1445    SLP Stop Time  1525    SLP Time Calculation (min) 40 min    Activity Tolerance Patient limited by pain             Past Medical History:  Diagnosis Date   H/O fibromyalgia    H/O insomnia    H/O measles    H/O rubella    H/O syncope    History of chicken pox 09/15/2019   History of palpitations    Hx of mumps    Mitral valve prolapse    Palpitations    Syncope    Von Willebrand disease ([facility])    Past Surgical History:  Procedure Laterality Date   NO PAST SURGERIES     Patient Active Problem List   Diagnosis Date Noted   Low back pain 09/08/2021   Allergies 08/04/2020   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    REFERRING DIAG:  R41.0 (ICD-10-CM) - Subacute confusional state   THERAPY DIAG:  Cognitive communication deficit  Rationale for Evaluation and Treatment Rehabilitation  SUBJECTIVE: "I told everyone how embarrassed I was about my handwriting and how I could only remember "ruby ring" last time I was here."  PAIN:  Are you having pain? No     OBJECTIVE:   TODAY'S TREATMENT:  09/22/21: Pt completed Neuro QOL - Cognitive Function this session  (107/140). Pt had several questions regarding how to answer the questions correctly and required further explanation for increased comprehension of written information. SLP and pt discussed pt's impairment in topic maintenance. Pt will begin answering the proposed question, then veer into a story and lose train of thought. Began discussion of attention strategies and how our cognition can be impacted by internal feelings (anxiety, depression etc.). Pt required further explanation of each separate type of attention, as she had difficulty providing examples to demonstrate understanding of each type. Will continue to discuss next session.    PATIENT EDUCATION: Education details: Cognitive-comm impairment Person educated: Patient and Spouse Education method: Medical illustrator Education comprehension: verbalized understanding and needs further education         GOALS: Goals reviewed with patient? Yes   SHORT TERM GOALS: Target date: 10/13/2021     Pt will verbalize 3 memory strategies to assist with recall of important information across 3 sessions. Baseline: Goal status: INITIAL   2.  Pt will verbalize 3 attention strategies to increase focus and comprehension during conversations across 3 sessions.  Baseline:  Goal status: INITIAL   3.  Pt will complete cognitive PROMS Baseline:  Goal status:  INITIAL     LONG TERM GOALS: Target date: 11/10/2021     Pt will demonstrate and/or report successful use of memory strategies to recall important information. Baseline:  Goal status: INITIAL   2.  Pt will demonstrate and/or report successful use of 3 attention strategies to increase focus comprehension during conversations. Baseline:  Goal status: INITIAL       ASSESSMENT:   CLINICAL IMPRESSION: Pt is a 76 yo female who presents to [facility] OP for evaluation with diagnosis of COVID long-hauler. See tx note. SLP rec skilled ST services to address cognitive-communication impairment to  increase confidence and maximize functional communication.      OBJECTIVE IMPAIRMENTS include attention and memory. These impairments are limiting patient from managing medications, managing appointments, managing finances, and effectively communicating at home and in community. Factors affecting potential to achieve goals and functional outcome are  levels of anxiety .Marland Kitchen Patient will benefit from skilled SLP services to address above impairments and improve overall function.   REHAB POTENTIAL: Good   PLAN: SLP FREQUENCY: 2x/week   SLP DURATION: 8 weeks   PLANNED INTERVENTIONS: Environmental controls, Cueing hierachy, Internal/external aids, Functional tasks, SLP instruction and feedback, Compensatory strategies, and Patient/family education     [place], CCC-SLP 09/22/2021, 2:54 PM

## 2021-09-23 ENCOUNTER — Ambulatory Visit (INDEPENDENT_AMBULATORY_CARE_PROVIDER_SITE_OTHER): Payer: Medicare Other | Admitting: Psychology

## 2021-09-23 DIAGNOSIS — F4310 Post-traumatic stress disorder, unspecified: Secondary | ICD-10-CM

## 2021-09-23 NOTE — Progress Notes (Signed)
[facility] Counselor/Therapist Progress Note  Patient ID: Dawn Simmons, MRN: [id number],    Date: 09/23/2021  Time Spent: 60 minutes  Treatment Type: Individual Therapy  Reported Symptoms: anxiety and depression and memory loss  Mental Status Exam: Appearance:  Casual     Behavior: Appropriate  Motor: Normal  Speech/Language:  Normal Rate  Affect: Blunt  Mood: pleasant  Thought process: normal  Thought content:   WNL  Sensory/Perceptual disturbances:   WNL  Orientation: oriented to person, place, time/date, and situation  Attention: Good  Concentration: Good  Memory: WNL  Fund of knowledge:  Good  Insight:   Good  Judgment:  Good  Impulse Control: Good   Risk Assessment: Danger to Self:  No Self-injurious Behavior: No Danger to Others: No Duty to Warn:no Physical Aggression / Violence:No  Access to Firearms a concern: No  Gang Involvement:No   Subjective: The patient attended a face-to-face individual therapy session in the office today.  The patient presents with a blunted affect and mood is pleasant.  The patient continues to be a little apprehensive about what is going to happen with her grandson.  He is still in [place] right now with his grandfather and they are not able to get a DNA test just yet.  The patient talked about going to a family reunion this weekend and she feels anxious about that and the trip up there.  I recommended that she try to think of it in a different way and enjoyed the scenery as she is going up.  The patient is doing much better than she has in the past and seems to be remembering more things.  We will continue to meet with patient on a regular basis to provide support and continue to work through any trauma issues that she continues to have.  Interventions: Cognitive Behavioral Therapy and Eye Movement Desensitization and Reprocessing (EMDR)  Diagnosis:PTSD (post-traumatic stress disorder)  Plan: Treatment  Plan  Strengths/Abilities:Insightful, motivated, supportive husband  Treatment Preferences: Outpatient Individual therapy  Statement of Needs: "I have a problem passing out and I was told I have PTSD"    Symptoms:  Demonstrates an exaggerated startle response.:(Status: improved). Depressed  or irritable mood.: (Status: improved). Describes a reliving of the event,  particularly through dissociative flashbacks.:  (Status: improved). Displays a  significant decline in interest and engagement in activities.: (Status: improved). Displays significant psychological and/or physiological distress resulting from internal and external  clues that are reminiscent of the traumatic event.: (Status: improved).  Experiences disturbances in sleep.: (Status: improved). Experiences disturbing  and persistent thoughts, images, and/or perceptions of the traumatic event.: (Status: improved). Experiences frequent nightmares.: (Status: improved).  Feelings of hopelessness, worthlessness, or inappropriate guilt.: (Status:  improved). Has been exposed to a traumatic event involving actual or perceived threat of death or  serious injury.: (Status: maintained). Impairment in social, occupational, or  other areas of functioning.: (Status: improved). Intentionally avoids activities,  places, people, or objects (e.g., up-armored vehicles) that evoke memories of the event.:(Status: maintained). Intentionally avoids thoughts, feelings, or discussions related  to the traumatic event.: (Status: improved). Reports difficulty concentrating as  well as feelings of guilt.:  (Status: improved). Reports response of intense fear,  helplessness, or horror to the traumatic event.:  (Status: improved).  Problems Addressed: PTSD   Goals:  LTG:  1. Develop healthy thinking patterns and beliefs about self, others, and the world that lead to the alleviation and help prevent the relapse of  depression.  40% Objective Identify  and  replace thoughts and beliefs that support depression.  40 % Target Date: 2022-05-01 Frequency: Weekly Progress: 40 Modality: individual 2. Eliminate or reduce the negative impact trauma related symptoms have  on social, occupational, and family functioning.  50% Objective Learn and implement personal skills to manage challenging situations related to trauma. Target Date: 2022-05-01 Frequency: Weekly Progress: 30 Modality: individual3. No longer avoids persons, places, activities, and objects that are  reminiscent of the traumatic event. Objective 3.  Participate in Eye Movement Desensitization and Reprocessing (EMDR) to reduce emotional distress  related to traumatic thoughts, feelings, and images. Target Date: 2022-05-01 Frequency: Weekly Progress: 30 Modality: individual 4.  Learn and implement guided self-dialogue to manage thoughts, feelings, and urges brought on by  encounters with trauma-related situations. Target Date: 2022-05-01 Frequency: Weekly Progress: 40 Modality: individual 5.  No longer experiences intrusive event recollections, avoidance of event  reminders, intense arousal, or disinterest in activities or  relationships. Target Date:05/01/2022  Frequency : Weekly Progress 30    Modality: individual 6.Thinks about or openly discusses the traumatic event with others  without experiencing psychological or physiological distress. Target Date : 05/01/2022 Frequency: Weekly Progress:30   Modality: Individual Interventions by Therapist:  CBT, problem solving therapy, EMDR, insight oriented.  Patient approved Treatment plan.  Neera Teng G Caylei Sperry, LCSW                                     Aureliano Oshields G Rajinder Mesick, LCSW               Golda Zavalza G Ellorie Kindall, LCSW               Janace Decker G Wendi Lastra, LCSW               Wilmar Prabhakar G Saya Mccoll, LCSW               Charrisse Masley G Lowella Kindley, LCSW               Anaelle Dunton [employer],  LCSW               Destinee Taber [employer], LCSW               Nydia Ytuarte [employer], LCSW               Yonas Bunda [employer], LCSW               Ayan Yankey G Anthea Udovich, LCSW               Lauriana Denes G Kyshaun Barnette, LCSW               Rayyan Burley G Bonnee Zertuche, LCSW               Cung Masterson G Braylyn Kalter, LCSW               Yamin Swingler G Modesta Sammons, LCSW               Challis Crill G Sophiea Ueda, LCSW               Printice Hellmer G Aryan Sparks, LCSW               Kenzleigh Sedam G Naika Noto, LCSW               Leya Paige G Jamiel Goncalves, LCSW               Analeia Ismael G Zaineb Nowaczyk, LCSW  Lew Prout G Jariah Jarmon, LCSW

## 2021-09-28 ENCOUNTER — Ambulatory Visit: Payer: Medicare Other | Admitting: Physical Therapy

## 2021-09-28 ENCOUNTER — Encounter: Payer: Self-pay | Admitting: Physical Therapy

## 2021-09-28 DIAGNOSIS — M5441 Lumbago with sciatica, right side: Secondary | ICD-10-CM | POA: Diagnosis not present

## 2021-09-28 DIAGNOSIS — M6281 Muscle weakness (generalized): Secondary | ICD-10-CM | POA: Diagnosis not present

## 2021-09-28 DIAGNOSIS — R41841 Cognitive communication deficit: Secondary | ICD-10-CM | POA: Diagnosis not present

## 2021-09-28 NOTE — Therapy (Signed)
OUTPATIENT PHYSICAL THERAPY TREATMENT NOTE   Patient Name: Dawn Simmons MRN: [id number] DOB:[birth year], 76 y.o., female Today's Date: 09/28/2021  PCP: Joaquin Courts, MD REFERRING PROVIDER: [name], MD  END OF SESSION:   PT End of Session - 09/28/21 1228     Visit Number 4    Number of Visits 9    Date for PT Re-Evaluation 10/12/21    Authorization Type Medicare    PT Start Time 1232    PT Stop Time 1315    PT Time Calculation (min) 43 min    Activity Tolerance Patient tolerated treatment well   Reports pain does not worsen during session   Behavior During Therapy [facility] for tasks assessed/performed               Past Medical History:  Diagnosis Date   H/O fibromyalgia    H/O insomnia    H/O measles    H/O rubella    H/O syncope    History of chicken pox 09/15/2019   History of palpitations    Hx of mumps    Mitral valve prolapse    Palpitations    Syncope    Von Willebrand disease (HCC)    Past Surgical History:  Procedure Laterality Date   NO PAST SURGERIES     Patient Active Problem List   Diagnosis Date Noted   Low back pain 09/08/2021   Allergies 08/04/2020   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   Right groin pain 01/10/2020   Right sided weakness 01/10/2020   Left shoulder pain 10/31/2019   Leukopenia 09/15/2019   PTSD (post-traumatic stress disorder) 09/15/2019   History of chicken pox 09/15/2019   Von Willebrand disease (HCC)    Upper airway cough syndrome 09/06/2018   Fibromyalgia 06/01/2015   Insomnia 06/01/2015   H/O syncope    Palpitations    Mitral valve prolapse     REFERRING DIAG: M54.41 (ICD-10-CM) - Acute right-sided low back pain with right-sided sciatica  THERAPY DIAG:  Acute right-sided low back pain with right-sided sciatica  Muscle weakness (generalized)  Rationale for Evaluation and Treatment Rehabilitation  PERTINENT HISTORY:  chronic L shoulder pain, bilateral greater trochanteric bursitis, cognitive changes noted at  09/10/21 MD visit), fibromyalgia, mitral valve prolapse  PRECAUTIONS: Fall  SUBJECTIVE: Went to [place] over the weekend, and I did okay with that trip.  Feel like the back is doing better.  Haven't had to take medications for pain today.  Patient is accompanied by husband.  PAIN:  Are you having pain? Yes: NPRS scale: 1-2/10 Pain location: buttocks Pain description: achy Aggravating factors: sitting too long aggravates Relieving factors: ice, heat   OBJECTIVE:    TODAY'S TREATMENT: 09/28/2021 Activity Comments  NuStep, Level 1, 4 extremities x 5 minutes for warm-up and flexibility through BLEs, keeps speed 28-33 steps/min   Standing facing mat/green therapy ball, UE push ball forward/back for gentle lumbar/hamstring stretch, 2 x 5 reps Cues to breathe throughout  motions  Seated forward lean >upright posture with bilat hands at green therapy ball, 2 x 5 reps Cues for gentle motions  Quadruped forward/back rocking through hips x 5   Quadruped with abdominal activation for neutral spine, alternating UE lifts, 2 x 3 reps Cues for technique  Gait 170 ft, then 50 ft x 2 with cues for upright posture, relaxed arm swing With relaxed arm swing, pt has improved step length and less antalgic gait pattern  Discussed walking program at home-3-5 minutes several times/day, in home with relaxed  arm swing and upright posture   Standing stagger stance forward/back rocking at counter, 10 reps each foot position        PATIENT EDUCATION: Education details: Updates to HEP-see below Person educated: Patient and Spouse Education method: Explanation, Demonstration, Handouts, and husband takes video of quadruped motions Education comprehension: verbalized understanding, returned demonstration, verbal cues required, and husband verbalized understanding    Access Code: [email or web address] URL: [email or web address] Date: 09/28/2021 Prepared by: [facility] - Outpatient  Rehab - [facility]  Exercises - Hooklying Single Knee to Chest Stretch  - 1-2 x daily - 5 x weekly - 1 sets - 3 reps - 15 sec hold - Supine Lower Trunk Rotation  - 1-2 x daily - 7 x weekly - 1 sets - 10 reps - Supine Anterior Pelvic Tilt  - 1 x daily - 5 x weekly - 1 sets - 5-10 reps - 3-5 sec  hold Added 09/28/2021: - Quadruped Rocking Slow  - 1 x daily - 7 x weekly - 1 sets - 5-10 reps - Quadruped Alternating Shoulder Flexion  - 1 x daily - 7 x weekly - 2 sets - 5 reps       _________________________________________________________________________ From EVAL:  (objective measures completed at initial evaluation unless otherwise dated)09/14/2021   DIAGNOSTIC FINDINGS: See MRI results   COGNITION: Overall cognitive status: Impaired: see notes in chart-speech therapy referral on chart.               MUSCLE LENGTH: Hamstrings: Right -38 deg from full extension; Left -30 deg from full extension SLR:  LLE 68, 52 RLE prior to onset of pain   POSTURE: rounded shoulders   LOWER EXTREMITY ROM:   See above     LOWER EXTREMITY MMT:     MMT Right Eval Left Eval  Hip flexion 3+/5 with pain 4/5  Hip extension      Hip abduction      Hip adduction      Hip internal rotation      Hip external rotation      Knee flexion 3/5 (at least-guarded due to discomfort) 5/5  Knee extension 3/5 (at least-guarded due to discomfort) 5/5  Ankle dorsiflexion 4/5 5/5  Ankle plantarflexion      Ankle inversion      Ankle eversion      (Blank rows = not tested)   BED MOBILITY:  Sit to supine SBA Supine to sit SBA Guarded with motion:  goes sit>long sit to supine TRANSFERS: Assistive device utilized: None  Sit to stand: SBA Stand to sit: SBA Guarded with motion   GAIT: Gait pattern: step through pattern and antalgic Distance walked: 40 ft x 2 Assistive device utilized: None Level of assistance: SBA Comments: Slowed, guarded gait pattern; slow to initiate gait   FUNCTIONAL TESTs:  Palpation:   Tender to palpation along paraspinal musculature R lumbar spine. Functional movements:  With [facility] (lumbar flexion), pt reports decreased radicular symptoms into R thigh/more centralized symptoms. Pain with SLR test in R buttocks into R posterior thigh.     TODAY'S TREATMENT:   Initiated HEP-see below.  Pt performs exercises below with cues for slowed pace, movement in painfree ranges. Access Code: [email or web address] URL: [email or web address] Date: 09/13/2021 Prepared by: [facility] - Outpatient  Rehab - [facility]   Exercises - Hooklying Single Knee to Chest Stretch  - 1-2 x daily - 5 x weekly - 1 sets - 3 reps - 15 sec hold - Supine Posterior Pelvic  Tilt  - 1-2 x daily - 7 x weekly - 1 sets - 10 reps - Supine Lower Trunk Rotation  - 1-2 x daily - 7 x weekly - 1 sets - 10 reps     PATIENT EDUCATION: Education details: PT eval results, POC, initiated HEP Person educated: Patient and Spouse Education method: Explanation, Demonstration, Verbal cues, and Handouts Education comprehension: verbalized understanding, returned demonstration, verbal cues required, needs further education, and will need husband's assist/supervision due to hx of memory issues     HOME EXERCISE PROGRAM: Med [employer] Access Code [email or web address]       GOALS: Goals reviewed with patient? Yes   SHORT TERM GOALS:  (Equal LTGs)   LONG TERM GOALS: Target date: 10/12/2021   Pt will perform HEP with supervision for decreased pain, improved functional mobility. Baseline: Pain rating at eval 5/10 with radiating pain into R posterior thigh Goal status: IN PROGRESS   2.  Pt will report at least 50% reduction in low back and radicular pain for improved overall functional mobiltiy. Baseline:  Goal status: IN PROGRESS   3.  Pt will improve RLE strength to at least 4/5 to improve ability for gait and transfers. Baseline:  Goal status: IN PROGRESS 4.  Pt/husband will verbalize understanding of posture/body mechanics  to reduce future injury and reduce pain.            Baseline:            Goal status:  IN PROGRESS   ASSESSMENT:   CLINICAL IMPRESSION: Pt presents to OPPT with reports that her back pain is better than it has been.  Conversation and education with pt/husband on taking advantage of less pain and encouraging normal movmeent patterns to decrease muscle guarding and limitations of movement.  She is able to perform standing, quadruped, aerobic exercises with no increase in pain noted at end of session.  She will continue to benefit from skilled PT towards goals for improved overall functional mobility and decreased pain.     OBJECTIVE IMPAIRMENTS decreased mobility, difficulty walking, decreased ROM, decreased strength, impaired flexibility, postural dysfunction, and pain.    ACTIVITY LIMITATIONS bending, sitting, standing, squatting, transfers, and locomotion level   PARTICIPATION LIMITATIONS: shopping, community activity, and church   PERSONAL FACTORS chronic L shoulder pain, bilateral greater trochanteric bursitis, cognitive changes noted at 09/10/21 MD visit), fibromyalgia, mitral valve prolapse, hx of COVID are also affecting patient's functional outcome.    REHAB POTENTIAL: Good   CLINICAL DECISION MAKING: Evolving/moderate complexity   EVALUATION COMPLEXITY: Moderate   PLAN: PT FREQUENCY: 2x/week   PT DURATION: 4 weeks plus eval   PLANNED INTERVENTIONS: Therapeutic exercises, Therapeutic activity, Neuromuscular re-education, Gait training, Patient/Family education, Joint mobilization, Electrical stimulation, Cryotherapy, Moist heat, Ultrasound, and Manual therapy   PLAN FOR NEXT SESSION: Review new additions to HEP; work on exercises that centralize pain into back and out of RLE.  Continue core stabilization-in quadruped (progress to alternating leg kicks with neutral spine).  Educate on posture/positioning.  Try standing weightshifting and stretching next visit.     Lonia Blood,  PT 09/28/21 1:31 PM Phone: [phone] Fax: [phone]   [facility] at [facility] [address] [place], [address] [place], [place] [address] Phone # [phone] Fax # [phone]

## 2021-09-28 NOTE — Therapy (Signed)
OUTPATIENT PHYSICAL THERAPY TREATMENT NOTE   Patient Name: Dawn Simmons MRN: [id number] DOB:1946-03-05, 76 y.o., female Today's Date: 09/29/2021  PCP: Joaquin Courts, MD REFERRING PROVIDER: [name], MD  END OF SESSION:   PT End of Session - 09/29/21 1147     Visit Number 5    Number of Visits 9    Date for PT Re-Evaluation 10/12/21    Authorization Type Medicare    PT Start Time 1103    PT Stop Time 1143    PT Time Calculation (min) 40 min    Activity Tolerance Patient tolerated treatment well   Reports pain does not worsen during session   Behavior During Therapy [facility] for tasks assessed/performed               Past Medical History:  Diagnosis Date   H/O fibromyalgia    H/O insomnia    H/O measles    H/O rubella    H/O syncope    History of chicken pox 09/15/2019   History of palpitations    Hx of mumps    Mitral valve prolapse    Palpitations    Syncope    Von Willebrand disease (HCC)    Past Surgical History:  Procedure Laterality Date   NO PAST SURGERIES     Patient Active Problem List   Diagnosis Date Noted   Low back pain 09/08/2021   Allergies 08/04/2020   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   Right groin pain 01/10/2020   Right sided weakness 01/10/2020   Left shoulder pain 10/31/2019   Leukopenia 09/15/2019   PTSD (post-traumatic stress disorder) 09/15/2019   History of chicken pox 09/15/2019   Von Willebrand disease (HCC)    Upper airway cough syndrome 09/06/2018   Fibromyalgia 06/01/2015   Insomnia 06/01/2015   H/O syncope    Palpitations    Mitral valve prolapse     REFERRING DIAG: M54.41 (ICD-10-CM) - Acute right-sided low back pain with right-sided sciatica  THERAPY DIAG:  Acute right-sided low back pain with right-sided sciatica  Muscle weakness (generalized)  Rationale for Evaluation and Treatment Rehabilitation  PERTINENT HISTORY:  chronic L shoulder pain, bilateral greater trochanteric bursitis, cognitive changes noted at  09/10/21 MD visit), fibromyalgia, mitral valve prolapse  PRECAUTIONS: Fall  SUBJECTIVE: Back did great on her trip to [place].   PAIN:  Are you having pain? Yes: NPRS scale: 2-3/10 Pain location: back and knees Pain description: achy Aggravating factors: sitting too long aggravates Relieving factors: ice, heat   OBJECTIVE:     TODAY'S TREATMENT: 09/29/21 Activity Comments  Nustep L4 x 6 min UEs/LEs Slow speed  STS with green medball at chest 10x Good control  hip ABD/EX 10x each LE  Good control c/o muscle fatigue; c/o more difficulty when Wbing on the R LE  mini squat 2x10 Cues for light grip on II bars, foot positioning  sidestepping and forward monster walks with yellowTB loop above knees in II bars Verbal and tactile cueing to direct weight anteirorly with monster walks to avoid posterior LOB  TB row/extension 2x10 each with red TB Cues for postioning and avoid posterior lean; limited amplitude with extension d/t weakness    PATIENT EDUCATION: Education details: HEP update Person educated: Patient and Spouse Education method: Explanation, Demonstration, Tactile cues, Verbal cues, and Handouts Education comprehension: verbalized understanding and returned demonstration   HOME EXERCISE PROGRAM Last updated: 09/29/21 Access Code: [email or web address] URL: [email or web address] Date: 09/29/2021 Prepared by: [facility] - Outpatient  Rehab - [facility]  Exercises - Hooklying Single Knee to Chest Stretch  - 1-2 x daily - 5 x weekly - 1 sets - 3 reps - 15 sec hold - Supine Lower Trunk Rotation  - 1-2 x daily - 7 x weekly - 1 sets - 10 reps - Supine Anterior Pelvic Tilt  - 1 x daily - 5 x weekly - 1 sets - 5-10 reps - 3-5 sec  hold - Quadruped Rocking Slow  - 1 x daily - 7 x weekly - 1 sets - 5-10 reps - Quadruped Alternating Shoulder Flexion  - 1 x daily - 7 x weekly - 2 sets - 5 reps - Standing Hip Abduction with Counter Support  - 1 x daily - 5 x weekly - 2 sets -  10 reps - Standing Hip Extension with Counter Support  - 1 x daily - 5 x weekly - 2 sets - 10 reps - Standing Bilateral Low Shoulder Row with Anchored Resistance  - 1 x daily - 5 x weekly - 2 sets - 10 reps - Shoulder extension with resistance - Neutral  - 1 x daily - 5 x weekly - 2 sets - 10 reps  ___________________________________ From EVAL:  (objective measures completed at initial evaluation unless otherwise dated)09/14/2021   DIAGNOSTIC FINDINGS: See MRI results   COGNITION: Overall cognitive status: Impaired: see notes in chart-speech therapy referral on chart.               MUSCLE LENGTH: Hamstrings: Right -38 deg from full extension; Left -30 deg from full extension SLR:  LLE 68, 52 RLE prior to onset of pain   POSTURE: rounded shoulders   LOWER EXTREMITY ROM:   See above     LOWER EXTREMITY MMT:     MMT Right Eval Left Eval  Hip flexion 3+/5 with pain 4/5  Hip extension      Hip abduction      Hip adduction      Hip internal rotation      Hip external rotation      Knee flexion 3/5 (at least-guarded due to discomfort) 5/5  Knee extension 3/5 (at least-guarded due to discomfort) 5/5  Ankle dorsiflexion 4/5 5/5  Ankle plantarflexion      Ankle inversion      Ankle eversion      (Blank rows = not tested)   BED MOBILITY:  Sit to supine SBA Supine to sit SBA Guarded with motion:  goes sit>long sit to supine TRANSFERS: Assistive device utilized: None  Sit to stand: SBA Stand to sit: SBA Guarded with motion   GAIT: Gait pattern: step through pattern and antalgic Distance walked: 40 ft x 2 Assistive device utilized: None Level of assistance: SBA Comments: Slowed, guarded gait pattern; slow to initiate gait   FUNCTIONAL TESTs:  Palpation:  Tender to palpation along paraspinal musculature R lumbar spine. Functional movements:  With [facility] (lumbar flexion), pt reports decreased radicular symptoms into R thigh/more centralized symptoms. Pain with SLR test in  R buttocks into R posterior thigh.     TODAY'S TREATMENT:   Initiated HEP-see below.  Pt performs exercises below with cues for slowed pace, movement in painfree ranges. Access Code: [email or web address] URL: [email or web address] Date: 09/13/2021 Prepared by: [facility] - Outpatient  Rehab - [facility]   Exercises - Hooklying Single Knee to Chest Stretch  - 1-2 x daily - 5 x weekly - 1 sets - 3 reps - 15 sec hold - Supine Posterior Pelvic Tilt  -  1-2 x daily - 7 x weekly - 1 sets - 10 reps - Supine Lower Trunk Rotation  - 1-2 x daily - 7 x weekly - 1 sets - 10 reps     PATIENT EDUCATION: Education details: PT eval results, POC, initiated HEP Person educated: Patient and Spouse Education method: Explanation, Demonstration, Verbal cues, and Handouts Education comprehension: verbalized understanding, returned demonstration, verbal cues required, needs further education, and will need husband's assist/supervision due to hx of memory issues     HOME EXERCISE PROGRAM: [employer] Access Code [email or web address]       GOALS: Goals reviewed with patient? Yes   SHORT TERM GOALS:  (Equal LTGs)   LONG TERM GOALS: Target date: 10/12/2021   Pt will perform HEP with supervision for decreased pain, improved functional mobility. Baseline: Pain rating at eval 5/10 with radiating pain into R posterior thigh Goal status: IN PROGRESS   2.  Pt will report at least 50% reduction in low back and radicular pain for improved overall functional mobiltiy. Baseline:  Goal status: IN PROGRESS   3.  Pt will improve RLE strength to at least 4/5 to improve ability for gait and transfers. Baseline:  Goal status: IN PROGRESS 4.  Pt/husband will verbalize understanding of posture/body mechanics to reduce future injury and reduce pain.            Baseline:            Goal status:  IN PROGRESS   ASSESSMENT:   CLINICAL IMPRESSION: Patient arrived to session with report of improved LBP and tolerated last  session well. Worked on weighted STS transfers for strengthening. Standing hip strengthening revealed good muscle control but c/o muscle fatigue. Short sitting break required after several minutes of standing activities.  Worked on postural strengthening activities to assist in core stability and more upright posture as patient ambulates with forward flexed posture and rounded shoulders. Reminders required intermittently to look straight ahead instead of head fixed in flexion. Updated HEP with exercises that were well-tolerated today. Patient without complaints at end of session.    OBJECTIVE IMPAIRMENTS decreased mobility, difficulty walking, decreased ROM, decreased strength, impaired flexibility, postural dysfunction, and pain.    ACTIVITY LIMITATIONS bending, sitting, standing, squatting, transfers, and locomotion level   PARTICIPATION LIMITATIONS: shopping, community activity, and church   PERSONAL FACTORS chronic L shoulder pain, bilateral greater trochanteric bursitis, cognitive changes noted at 09/10/21 MD visit), fibromyalgia, mitral valve prolapse, hx of COVID are also affecting patient's functional outcome.    REHAB POTENTIAL: Good   CLINICAL DECISION MAKING: Evolving/moderate complexity   EVALUATION COMPLEXITY: Moderate   PLAN: PT FREQUENCY: 2x/week   PT DURATION: 4 weeks plus eval   PLANNED INTERVENTIONS: Therapeutic exercises, Therapeutic activity, Neuromuscular re-education, Gait training, Patient/Family education, Joint mobilization, Electrical stimulation, Cryotherapy, Moist heat, Ultrasound, and Manual therapy   PLAN FOR NEXT SESSION: Review additions to HEP; work on exercises that centralize pain into back and out of RLE.  Core stabilization; modalities/manual traction?   Educate on posture/positioning.  Try standing weightshifting and stretching next visit.     Janene Harvey, PT, DPT 09/29/21 11:47 AM  [facility] at [facility] [address] [place], [place],  [address] Phone # [phone] Fax # [phone]

## 2021-09-29 ENCOUNTER — Encounter: Payer: Self-pay | Admitting: Physical Therapy

## 2021-09-29 ENCOUNTER — Encounter: Payer: Self-pay | Admitting: Speech Pathology

## 2021-09-29 ENCOUNTER — Ambulatory Visit: Payer: Medicare Other | Admitting: Speech Pathology

## 2021-09-29 ENCOUNTER — Ambulatory Visit: Payer: Medicare Other | Admitting: Physical Therapy

## 2021-09-29 DIAGNOSIS — R41841 Cognitive communication deficit: Secondary | ICD-10-CM | POA: Diagnosis not present

## 2021-09-29 DIAGNOSIS — M5441 Lumbago with sciatica, right side: Secondary | ICD-10-CM | POA: Diagnosis not present

## 2021-09-29 DIAGNOSIS — M6281 Muscle weakness (generalized): Secondary | ICD-10-CM | POA: Diagnosis not present

## 2021-09-29 NOTE — Therapy (Signed)
OUTPATIENT SPEECH LANGUAGE PATHOLOGY TREATMENT NOTE   Patient Name: Dawn Simmons MRN: [id number] DOB:1945-12-30, 76 y.o., female Today's Date: 09/29/2021  PCP: Bradd Canary, MD REFERRING PROVIDER: Rodolph Bong, MD  END OF SESSION:   End of Session - 09/29/21 1230     Visit Number 3    Number of Visits 17    Date for SLP Re-Evaluation 11/15/21    SLP Start Time 1230    SLP Stop Time  1310    SLP Time Calculation (min) 40 min    Activity Tolerance Patient limited by pain             Past Medical History:  Diagnosis Date   H/O fibromyalgia    H/O insomnia    H/O measles    H/O rubella    H/O syncope    History of chicken pox 09/15/2019   History of palpitations    Hx of mumps    Mitral valve prolapse    Palpitations    Syncope    Von Willebrand disease (HCC)    Past Surgical History:  Procedure Laterality Date   NO PAST SURGERIES     Patient Active Problem List   Diagnosis Date Noted   Low back pain 09/08/2021   Allergies 08/04/2020   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    REFERRING DIAG:  R41.0 (ICD-10-CM) - Subacute confusional state   THERAPY DIAG: Cognitive communication deficit  Rationale for Evaluation and Treatment Rehabilitation  SUBJECTIVE: "I saw you twice?"   PAIN:  Are you having pain? No     OBJECTIVE:   TODAY'S TREATMENT:  09/29/21: Skilled ST focused on targeting cognition-communication this session. Pt was unable to recall any information spoken about in previous session and reports she did not  really recognize therapist (though therapist has seen her 3x). She did, however, perseverate on "ruby red ring" when she entered the room and seemed to be anxious regarding her memory from the story. SLP reviewed strategies from previous session due to pt limited memory. SLP thoroughly explained each strategy and provided examples for each. Pt had difficulty comprehending verbally-provided information, appearing visibly confused and looking at husband. SLP then had pt repeat back for confirmation what was heard. Pt was only able to provide 1 detail on average of what SLP had said. Pt would benefit from repeating back what was heard to ensure comprehension of information. Cont with attention strategies next session.    09/22/21: Pt completed Neuro QOL - Cognitive Function this session (107/140). Pt had several questions regarding how to answer the questions correctly and required further explanation for increased comprehension of written information. SLP and pt discussed pt's impairment in topic maintenance. Pt will begin answering the proposed question, then veer into a story and lose train of thought. Began discussion of attention strategies and how our cognition can be impacted by internal feelings (anxiety, depression etc.). Pt required further explanation of each separate type of attention, as she had difficulty providing examples to  demonstrate understanding of each type. Will continue to discuss next session.    PATIENT EDUCATION: Education details: Cognitive-comm impairment Person educated: Patient and Spouse Education method: Medical illustrator Education comprehension: verbalized understanding and needs further education         GOALS: Goals reviewed with patient? Yes   SHORT TERM GOALS: Target date: 10/13/2021     Pt will verbalize 3 memory strategies to assist with recall of important information across 3 sessions. Baseline: Goal status: INITIAL   2.  Pt will verbalize 3  attention strategies to increase focus and comprehension during conversations across 3 sessions.  Baseline:  Goal status: INITIAL   3.  Pt will complete cognitive PROMS Baseline:  Goal status: INITIAL     LONG TERM GOALS: Target date: 11/10/2021     Pt will demonstrate and/or report successful use of memory strategies to recall important information. Baseline:  Goal status: INITIAL   2.  Pt will demonstrate and/or report successful use of 3 attention strategies to increase focus comprehension during conversations. Baseline:  Goal status: INITIAL       ASSESSMENT:   CLINICAL IMPRESSION: Pt is a 76 yo female who presents to ST OP for evaluation with diagnosis of COVID long-hauler. See tx note. SLP rec skilled ST services to address cognitive-communication impairment to increase confidence and maximize functional communication.      OBJECTIVE IMPAIRMENTS include attention and memory. These impairments are limiting patient from managing medications, managing appointments, managing finances, and effectively communicating at home and in community. Factors affecting potential to achieve goals and functional outcome are  levels of anxiety .Marland Kitchen Patient will benefit from skilled SLP services to address above impairments and improve overall function.   REHAB POTENTIAL: Good   PLAN: SLP FREQUENCY: 2x/week   SLP DURATION: 8 weeks   PLANNED INTERVENTIONS: Environmental controls, Cueing hierachy, Internal/external aids, Functional tasks, SLP instruction and feedback, Compensatory strategies, and Patient/family education     [place], CCC-SLP 09/29/2021, 12:31 PM

## 2021-09-30 ENCOUNTER — Ambulatory Visit (INDEPENDENT_AMBULATORY_CARE_PROVIDER_SITE_OTHER): Payer: Medicare Other | Admitting: Psychology

## 2021-09-30 DIAGNOSIS — F4310 Post-traumatic stress disorder, unspecified: Secondary | ICD-10-CM | POA: Diagnosis not present

## 2021-10-04 ENCOUNTER — Encounter: Payer: Self-pay | Admitting: Physical Therapy

## 2021-10-04 ENCOUNTER — Ambulatory Visit: Payer: Medicare Other | Admitting: Physical Therapy

## 2021-10-04 ENCOUNTER — Ambulatory Visit: Payer: Medicare Other

## 2021-10-04 DIAGNOSIS — R41841 Cognitive communication deficit: Secondary | ICD-10-CM

## 2021-10-04 DIAGNOSIS — M6281 Muscle weakness (generalized): Secondary | ICD-10-CM

## 2021-10-04 DIAGNOSIS — M5441 Lumbago with sciatica, right side: Secondary | ICD-10-CM | POA: Diagnosis not present

## 2021-10-04 NOTE — Therapy (Signed)
OUTPATIENT SPEECH LANGUAGE PATHOLOGY TREATMENT NOTE   Patient Name: Dawn Simmons MRN: [id number] DOB:[birth year], 76 y.o., female Today's Date: 10/04/2021  PCP: Bradd Canary, MD REFERRING PROVIDER: Rodolph Bong, MD  END OF SESSION:   End of Session - 10/04/21 1745     Visit Number 4    Number of Visits 17    Date for SLP Re-Evaluation 11/15/21    SLP Start Time 1321    SLP Stop Time  1400    SLP Time Calculation (min) 39 min    Activity Tolerance Other (comment)   limited by psych factors impeding attention/memory             Past Medical History:  Diagnosis Date   H/O fibromyalgia    H/O insomnia    H/O measles    H/O rubella    H/O syncope    History of chicken pox 09/15/2019   History of palpitations    Hx of mumps    Mitral valve prolapse    Palpitations    Syncope    Von Willebrand disease (HCC)    Past Surgical History:  Procedure Laterality Date   NO PAST SURGERIES     Patient Active Problem List   Diagnosis Date Noted   Low back pain 09/08/2021   Allergies 08/04/2020   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    REFERRING DIAG:  R41.0 (ICD-10-CM) - Subacute confusional state   THERAPY DIAG: Cognitive communication deficit  Rationale for Evaluation and Treatment Rehabilitation  SUBJECTIVE: "It started after I got COVID." (Pt repeated this fact to SLP x3 during this session, husband telling pt once, that current deficits began before COVID)   PAIN:  Are you having pain? No     OBJECTIVE:   TODAY'S  TREATMENT:  10/04/21: Pt did not reportedly recall anything except "red ruby ring, a box, and a pocket" from previous sessions. SLP educated pt and husband on compensations for memory. With multiple repeats (x4) in 15 minutes pt was able to recall 1/4, incr'd to 2/4 with extra time and incr'd to 3/4 with min A. Pt, notably, recalled details about her rose bush, and current events regarding her son and grandson without difficulty. SLP strongly suspects psychological basis to pt's deficits, which pt acknowledged during session today as well. Pt with much tangential conversation. SLP educated pt and husband about how to maximize memory compensations at home -mostly using writing which appeared to be, from pt and husband report, pt's premorbid means to improve recall. They have calendar at home now which pt reportedly writes all appointments/has all appointments written. Pt homework to write errand stops and/or lists for items to purchase whenever they go anywhere.  09/29/21: Skilled ST focused on targeting cognition-communication this session. Pt was unable to recall any information spoken about in previous session and reports she did not really recognize therapist (though therapist has seen her 3x). She did, however, perseverate on "ruby red ring" when she entered the room and seemed to  be anxious regarding her memory from the story. SLP reviewed strategies from previous session due to pt limited memory. SLP thoroughly explained each strategy and provided examples for each. Pt had difficulty comprehending verbally-provided information, appearing visibly confused and looking at husband. SLP then had pt repeat back for confirmation what was heard. Pt was only able to provide 1 detail on average of what SLP had said. Pt would benefit from repeating back what was heard to ensure comprehension of information. Cont with attention strategies next session.    09/22/21: Pt completed Neuro QOL - Cognitive Function this  session (107/140). Pt had several questions regarding how to answer the questions correctly and required further explanation for increased comprehension of written information. SLP and pt discussed pt's impairment in topic maintenance. Pt will begin answering the proposed question, then veer into a story and lose train of thought. Began discussion of attention strategies and how our cognition can be impacted by internal feelings (anxiety, depression etc.). Pt required further explanation of each separate type of attention, as she had difficulty providing examples to demonstrate understanding of each type. Will continue to discuss next session.    PATIENT EDUCATION: Education details: see above in "today's treatment" Person educated: Patient and Spouse Education method: Explanation and Demonstration Education comprehension: verbalized understanding and needs further education         GOALS: Goals reviewed with patient? Yes   SHORT TERM GOALS: Target date: 10/13/2021     Pt will verbalize 3 memory strategies to assist with recall of important information across 3 sessions. Baseline: Goal status: Ongoing   2.  Pt will verbalize 3 attention strategies to increase focus and comprehension during conversations across 3 sessions.  Baseline:  Goal status: Ongoing   3.  Pt will complete cognitive PROMS Baseline:  Goal status: Ongoing     LONG TERM GOALS: Target date: 11/10/2021     Pt will demonstrate and/or report successful use of memory strategies to recall important information. Baseline:  Goal status: Ongoing   2.  Pt will demonstrate and/or report successful use of 3 attention strategies to increase focus comprehension during conversations. Baseline:  Goal status: Ongoing       ASSESSMENT:   CLINICAL IMPRESSION: Pt is a 76 yo female who presents to [facility]ST for tx, with diagnosis of COVID long-hauler. See tx note. SLP cont to rec skilled ST services to address cognitive-communication  impairment to increase confidence and maximize functional communication. Suspect basis of the majority of pt's sx are psychological.     OBJECTIVE IMPAIRMENTS include attention and memory. These impairments are limiting patient from managing medications, managing appointments, managing finances, and effectively communicating at home and in community. Factors affecting potential to achieve goals and functional outcome are  levels of anxiety .Marland Kitchen Patient will benefit from skilled SLP services to address above impairments and improve overall function.   REHAB POTENTIAL: Good   PLAN: SLP FREQUENCY: 2x/week   SLP DURATION: 8 weeks   PLANNED INTERVENTIONS: Environmental controls, Cueing hierachy, Internal/external aids, Functional tasks, SLP instruction and feedback, Compensatory strategies, and Patient/family education     [facility], CCC-SLP 10/04/2021, 5:46 PM

## 2021-10-06 ENCOUNTER — Encounter: Payer: Self-pay | Admitting: Physical Therapy

## 2021-10-06 ENCOUNTER — Ambulatory Visit: Payer: Medicare Other | Admitting: Physical Therapy

## 2021-10-06 DIAGNOSIS — M6281 Muscle weakness (generalized): Secondary | ICD-10-CM | POA: Diagnosis not present

## 2021-10-06 DIAGNOSIS — M5441 Lumbago with sciatica, right side: Secondary | ICD-10-CM | POA: Diagnosis not present

## 2021-10-06 DIAGNOSIS — R41841 Cognitive communication deficit: Secondary | ICD-10-CM | POA: Diagnosis not present

## 2021-10-06 NOTE — Therapy (Signed)
OUTPATIENT PHYSICAL THERAPY TREATMENT NOTE   Patient Name: Dawn Simmons MRN: [id number] DOB:19-Mar-1946, 76 y.o., female Today's Date: 10/06/2021  PCP: Joaquin Courts, MD REFERRING PROVIDER: [name], MD  END OF SESSION:   PT End of Session - 10/06/21 1527     Visit Number 7    Number of Visits 9    Date for PT Re-Evaluation 10/12/21    Authorization Type Medicare    PT Start Time 1453   pt late   PT Stop Time 1527    PT Time Calculation (min) 34 min    Activity Tolerance Patient tolerated treatment well;Patient limited by fatigue    Behavior During Therapy [facility] for tasks assessed/performed                 Past Medical History:  Diagnosis Date   H/O fibromyalgia    H/O insomnia    H/O measles    H/O rubella    H/O syncope    History of chicken pox 09/15/2019   History of palpitations    Hx of mumps    Mitral valve prolapse    Palpitations    Syncope    Von Willebrand disease (HCC)    Past Surgical History:  Procedure Laterality Date   NO PAST SURGERIES     Patient Active Problem List   Diagnosis Date Noted   Low back pain 09/08/2021   Allergies 08/04/2020   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   Right groin pain 01/10/2020   Right sided weakness 01/10/2020   Left shoulder pain 10/31/2019   Leukopenia 09/15/2019   PTSD (post-traumatic stress disorder) 09/15/2019   History of chicken pox 09/15/2019   Von Willebrand disease (HCC)    Upper airway cough syndrome 09/06/2018   Fibromyalgia 06/01/2015   Insomnia 06/01/2015   H/O syncope    Palpitations    Mitral valve prolapse     REFERRING DIAG: M54.41 (ICD-10-CM) - Acute right-sided low back pain with right-sided sciatica  THERAPY DIAG:  Acute right-sided low back pain with right-sided sciatica  Muscle weakness (generalized)  Rationale for Evaluation and Treatment Rehabilitation  PERTINENT HISTORY:  chronic L shoulder pain, bilateral greater trochanteric bursitis, cognitive changes noted at 09/10/21  MD visit), fibromyalgia, mitral valve prolapse  PRECAUTIONS: Fall  SUBJECTIVE: Feeling beat- worked out in the garden and the back was cracking.   PAIN:  Are you having pain? Yes: NPRS scale: 0/10 Pain location: back Pain description: fatigue Aggravating factors: sitting too long aggravates Relieving factors: ice, heat   OBJECTIVE:    TODAY'S TREATMENT: 10/06/21 Activity Comments  Scifit LEs only L1 x 6 min  Good speed   mini squat + lateral stepout 2x5 Cueing and demo for proper movement sequencing   step ups alt on 6" step 2x10  Cueing to assist in coordination/sequencing; intermittently catching toes on step   paloff press with red TB 10x each C/o difficulty; good form   resisted trunk rotation with red TB 10x each Cues to decrease speed  R/L UT stretch 30" each Cues to avoid pushing into pain  R/L UT stretch 30" each Cues to avoid pushing into pain    PATIENT EDUCATION: Education details: answered patient's questions on how to address neck pain Person educated: Patient Education method: Explanation, Demonstration, Tactile cues, and Verbal cues Education comprehension: verbalized understanding and returned demonstration   HOME EXERCISE PROGRAM Last updated: 09/29/21 Access Code: [email or web address] URL: [email or web address] Date: 09/29/2021 Prepared by: [facility] - Outpatient  Rehab - [facility]  Exercises - Hooklying Single Knee to Chest Stretch  - 1-2 x daily - 5 x weekly - 1 sets - 3 reps - 15 sec hold - Supine Lower Trunk Rotation  - 1-2 x daily - 7 x weekly - 1 sets - 10 reps - Supine Anterior Pelvic Tilt  - 1 x daily - 5 x weekly - 1 sets - 5-10 reps - 3-5 sec  hold - Quadruped Rocking Slow  - 1 x daily - 7 x weekly - 1 sets - 5-10 reps - Quadruped Alternating Shoulder Flexion  - 1 x daily - 7 x weekly - 2 sets - 5 reps - Standing Hip Abduction with Counter Support  - 1 x daily - 5 x weekly - 2 sets - 10 reps - Standing Hip Extension with Counter  Support  - 1 x daily - 5 x weekly - 2 sets - 10 reps - Standing Bilateral Low Shoulder Row with Anchored Resistance  - 1 x daily - 5 x weekly - 2 sets - 10 reps - Shoulder extension with resistance - Neutral  - 1 x daily - 5 x weekly - 2 sets - 10 reps  ___________________________________ From EVAL:  (objective measures completed at initial evaluation unless otherwise dated)09/14/2021   DIAGNOSTIC FINDINGS: See MRI results   COGNITION: Overall cognitive status: Impaired: see notes in chart-speech therapy referral on chart.               MUSCLE LENGTH: Hamstrings: Right -38 deg from full extension; Left -30 deg from full extension SLR:  LLE 68, 52 RLE prior to onset of pain   POSTURE: rounded shoulders   LOWER EXTREMITY ROM:   See above     LOWER EXTREMITY MMT:     MMT Right Eval Left Eval  Hip flexion 3+/5 with pain 4/5  Hip extension      Hip abduction      Hip adduction      Hip internal rotation      Hip external rotation      Knee flexion 3/5 (at least-guarded due to discomfort) 5/5  Knee extension 3/5 (at least-guarded due to discomfort) 5/5  Ankle dorsiflexion 4/5 5/5  Ankle plantarflexion      Ankle inversion      Ankle eversion      (Blank rows = not tested)   BED MOBILITY:  Sit to supine SBA Supine to sit SBA Guarded with motion:  goes sit>long sit to supine TRANSFERS: Assistive device utilized: None  Sit to stand: SBA Stand to sit: SBA Guarded with motion   GAIT: Gait pattern: step through pattern and antalgic Distance walked: 40 ft x 2 Assistive device utilized: None Level of assistance: SBA Comments: Slowed, guarded gait pattern; slow to initiate gait   FUNCTIONAL TESTs:  Palpation:  Tender to palpation along paraspinal musculature R lumbar spine. Functional movements:  With [facility] (lumbar flexion), pt reports decreased radicular symptoms into R thigh/more centralized symptoms. Pain with SLR test in R buttocks into R posterior thigh.      TODAY'S TREATMENT:   Initiated HEP-see below.  Pt performs exercises below with cues for slowed pace, movement in painfree ranges. Access Code: [email or web address] URL: [email or web address] Date: 09/13/2021 Prepared by: [facility] - Outpatient  Rehab - [facility]   Exercises - Hooklying Single Knee to Chest Stretch  - 1-2 x daily - 5 x weekly - 1 sets - 3 reps - 15 sec hold - Supine Posterior Pelvic Tilt  -  1-2 x daily - 7 x weekly - 1 sets - 10 reps - Supine Lower Trunk Rotation  - 1-2 x daily - 7 x weekly - 1 sets - 10 reps     PATIENT EDUCATION: Education details: PT eval results, POC, initiated HEP Person educated: Patient and Spouse Education method: Explanation, Demonstration, Verbal cues, and Handouts Education comprehension: verbalized understanding, returned demonstration, verbal cues required, needs further education, and will need husband's assist/supervision due to hx of memory issues     HOME EXERCISE PROGRAM: [employer] Access Code [email or web address]       GOALS: Goals reviewed with patient? Yes   SHORT TERM GOALS:  (Equal LTGs)   LONG TERM GOALS: Target date: 10/12/2021   Pt will perform HEP with supervision for decreased pain, improved functional mobility. Baseline: Pain rating at eval 5/10 with radiating pain into R posterior thigh Goal status: IN PROGRESS   2.  Pt will report at least 50% reduction in low back and radicular pain for improved overall functional mobiltiy. Baseline:  Goal status: IN PROGRESS   3.  Pt will improve RLE strength to at least 4/5 to improve ability for gait and transfers. Baseline:  Goal status: IN PROGRESS 4.  Pt/husband will verbalize understanding of posture/body mechanics to reduce future injury and reduce pain.            Baseline:            Goal status:  IN PROGRESS   ASSESSMENT:   CLINICAL IMPRESSION: Patient arrived to session with report of fatigue from working out in the garden. Worked on progressive LE and core  strengthening activities today. Patient reported increased fatigue from the activities performed earlier in the day but able to tolerate session well. Required occasional cues to decrease pace and assist with sequencing of exercises d/t memory loss. Patient noted some neck pain and tightness after trimming her rose bushes, thus worked on gentle cervical stretching to address this and encouraged use of moist heat. No complaints at end of session.    OBJECTIVE IMPAIRMENTS decreased mobility, difficulty walking, decreased ROM, decreased strength, impaired flexibility, postural dysfunction, and pain.    ACTIVITY LIMITATIONS bending, sitting, standing, squatting, transfers, and locomotion level   PARTICIPATION LIMITATIONS: shopping, community activity, and church   PERSONAL FACTORS chronic L shoulder pain, bilateral greater trochanteric bursitis, cognitive changes noted at 09/10/21 MD visit), fibromyalgia, mitral valve prolapse, hx of COVID are also affecting patient's functional outcome.    REHAB POTENTIAL: Good   CLINICAL DECISION MAKING: Evolving/moderate complexity   EVALUATION COMPLEXITY: Moderate   PLAN: PT FREQUENCY: 2x/week   PT DURATION: 4 weeks plus eval   PLANNED INTERVENTIONS: Therapeutic exercises, Therapeutic activity, Neuromuscular re-education, Gait training, Patient/Family education, Joint mobilization, Electrical stimulation, Cryotherapy, Moist heat, Ultrasound, and Manual therapy   PLAN FOR NEXT SESSION: Review additions to HEP; work on exercises that centralize pain into back and out of RLE.  Core stabilization; modalities/manual traction?   Educate on posture/positioning.  Try standing weightshifting and stretching next visit.     Anette Guarneri, PT, DPT 10/06/21 3:29 PM  [facility] [address] [place], [address] [place], [place] [address] Phone # [phone] Fax # [phone]

## 2021-10-07 ENCOUNTER — Ambulatory Visit (INDEPENDENT_AMBULATORY_CARE_PROVIDER_SITE_OTHER): Payer: Medicare Other | Admitting: Psychology

## 2021-10-07 DIAGNOSIS — F4310 Post-traumatic stress disorder, unspecified: Secondary | ICD-10-CM

## 2021-10-08 NOTE — Progress Notes (Signed)
[facility] Counselor/Therapist Progress Note  Patient ID: Dawn Simmons, MRN: [id number],    Date: 10/07/2021  Time Spent: 60 minutes  Treatment Type: Individual Therapy  Reported Symptoms: anxiety and depression and memory loss  Mental Status Exam: Appearance:  Casual     Behavior: Appropriate  Motor: Normal  Speech/Language:  Normal Rate  Affect: Blunt  Mood: pleasant  Thought process: normal  Thought content:   WNL  Sensory/Perceptual disturbances:   WNL  Orientation: oriented to person, place, time/date, and situation  Attention: Good  Concentration: Good  Memory: WNL  Fund of knowledge:  Good  Insight:   Good  Judgment:  Good  Impulse Control: Good   Risk Assessment: Danger to Self:  No Self-injurious Behavior: No Danger to Others: No Duty to Warn:no Physical Aggression / Violence:No  Access to Firearms a concern: No  Gang Involvement:No   Subjective: The patient attended a face-to-face individual therapy session in the office today.  The patient presents with a blunted affect and mood is pleasant.  The patient reports that her grandson's DNA came out to be her son's child.  They now have a court date on July 11.  The patient is still anxious about the situation and I explained to her that she needs to do what she can to keep herself calm.  We talked about other things that she is doing to help herself cope such as gardening.  The patient is doing much better and does not seem as fearful as she has in the past and she seems to be remembering more events in her life.  We will continue to provide supportive therapy and cognitive behavioral therapy as needed.   Interventions: Cognitive Behavioral Therapy and Eye Movement Desensitization and Reprocessing (EMDR)  Diagnosis:PTSD (post-traumatic stress disorder)  Plan: Treatment Plan  Strengths/Abilities:Insightful, motivated, supportive husband  Treatment Preferences: Outpatient Individual  therapy  Statement of Needs: "I have a problem passing out and I was told I have PTSD"    Symptoms:  Demonstrates an exaggerated startle response.:(Status: improved). Depressed  or irritable mood.: (Status: improved). Describes a reliving of the event,  particularly through dissociative flashbacks.:  (Status: improved). Displays a  significant decline in interest and engagement in activities.: (Status: improved). Displays significant psychological and/or physiological distress resulting from internal and external  clues that are reminiscent of the traumatic event.: (Status: improved).  Experiences disturbances in sleep.: (Status: improved). Experiences disturbing  and persistent thoughts, images, and/or perceptions of the traumatic event.: (Status: improved). Experiences frequent nightmares.: (Status: improved).  Feelings of hopelessness, worthlessness, or inappropriate guilt.: (Status:  improved). Has been exposed to a traumatic event involving actual or perceived threat of death or  serious injury.: (Status: maintained). Impairment in social, occupational, or  other areas of functioning.: (Status: improved). Intentionally avoids activities,  places, people, or objects (e.g., up-armored vehicles) that evoke memories of the event.:(Status: maintained). Intentionally avoids thoughts, feelings, or discussions related  to the traumatic event.: (Status: improved). Reports difficulty concentrating as  well as feelings of guilt.:  (Status: improved). Reports response of intense fear,  helplessness, or horror to the traumatic event.:  (Status: improved).  Problems Addressed: PTSD   Goals:  LTG:  1. Develop healthy thinking patterns and beliefs about self, others, and the world that lead to the alleviation and help prevent the relapse of  depression.  50% Objective Identify and replace thoughts and beliefs that support depression.  50 % Target Date: 2022-05-01 Frequency: Weekly Progress: 50  Modality: individual 2.  Eliminate or reduce the negative impact trauma related symptoms have  on social, occupational, and family functioning.  50% Objective Learn and implement personal skills to manage challenging situations related to trauma. Target Date: 2022-05-01 Frequency: Weekly Progress: 30 Modality: individual3. No longer avoids persons, places, activities, and objects that are  reminiscent of the traumatic event. Objective 3.  Participate in Eye Movement Desensitization and Reprocessing (EMDR) to reduce emotional distress  related to traumatic thoughts, feelings, and images. Target Date: 2022-05-01 Frequency: Weekly Progress: 30 Modality: individual 4.  Learn and implement guided self-dialogue to manage thoughts, feelings, and urges brought on by  encounters with trauma-related situations. Target Date: 2022-05-01 Frequency: Weekly Progress: 40 Modality: individual 5.  No longer experiences intrusive event recollections, avoidance of event  reminders, intense arousal, or disinterest in activities or  relationships. Target Date:05/01/2022  Frequency : Weekly Progress 30    Modality: individual 6.Thinks about or openly discusses the traumatic event with others  without experiencing psychological or physiological distress. Target Date : 05/01/2022 Frequency: Weekly Progress:30   Modality: Individual Interventions by Therapist:  CBT, problem solving therapy, EMDR, insight oriented.  Patient approved Treatment plan.  Paydon Carll G Orla Jolliff, LCSW                                     Veronia Laprise G Demarr Kluever, LCSW               Xiadani Damman G Chaniyah Jahr, LCSW               Hermine Feria G Mayo Owczarzak, LCSW               Ezell Melikian G Pattricia Weiher, LCSW               Judianne Seiple G Theda Payer, LCSW               Tenesia Escudero [employer], LCSW               Chala Gul [employer], LCSW               Erby Sanderson [employer],  LCSW               Patrick Salemi [employer], LCSW               Yomaira Solar [employer], LCSW               Isabelly Kobler G Shanitha Twining, LCSW               Fue Cervenka G Josslynn Mentzer, LCSW               Lorrie Gargan G Holton Sidman, LCSW               Manasvi Dickard G Izeah Vossler, LCSW               Kylii Ennis G Akima Slaugh, LCSW               Nature Kueker G Bruna Dills, LCSW               Dhrithi Riche G Uno Esau, LCSW               Amayah Staheli G Eliska Hamil, LCSW               Jihan Rudy G Aydenn Gervin, LCSW               Bristyn Kulesza G Winnell Bento, LCSW  Floree Zuniga G Kaytlyn Din, LCSW               Aariz Maish G Elizabethann Lackey, LCSW

## 2021-10-11 ENCOUNTER — Ambulatory Visit: Payer: Medicare Other | Attending: Family Medicine | Admitting: Physical Therapy

## 2021-10-11 ENCOUNTER — Encounter: Payer: Self-pay | Admitting: Physical Therapy

## 2021-10-11 ENCOUNTER — Ambulatory Visit: Payer: Medicare Other

## 2021-10-11 DIAGNOSIS — M6281 Muscle weakness (generalized): Secondary | ICD-10-CM | POA: Insufficient documentation

## 2021-10-11 DIAGNOSIS — M5441 Lumbago with sciatica, right side: Secondary | ICD-10-CM | POA: Diagnosis not present

## 2021-10-11 DIAGNOSIS — R41841 Cognitive communication deficit: Secondary | ICD-10-CM | POA: Insufficient documentation

## 2021-10-11 NOTE — Therapy (Signed)
OUTPATIENT SPEECH LANGUAGE PATHOLOGY TREATMENT NOTE   Patient Name: Dawn Simmons MRN: [id number] DOB:02/07/1946, 76 y.o., female Today's Date: 10/11/2021  PCP: Bradd Canary, MD REFERRING PROVIDER: Rodolph Bong, MD  END OF SESSION:   End of Session - 10/11/21 1407     Visit Number 5    Number of Visits 17    Date for SLP Re-Evaluation 11/15/21    SLP Start Time 1404    SLP Stop Time  1445    SLP Time Calculation (min) 41 min    Activity Tolerance Patient tolerated treatment well              Past Medical History:  Diagnosis Date   H/O fibromyalgia    H/O insomnia    H/O measles    H/O rubella    H/O syncope    History of chicken pox 09/15/2019   History of palpitations    Hx of mumps    Mitral valve prolapse    Palpitations    Syncope    Von Willebrand disease (HCC)    Past Surgical History:  Procedure Laterality Date   NO PAST SURGERIES     Patient Active Problem List   Diagnosis Date Noted   Low back pain 09/08/2021   Allergies 08/04/2020   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    REFERRING DIAG:  R41.0 (ICD-10-CM) - Subacute confusional state   THERAPY DIAG: Cognitive communication deficit  Rationale for Evaluation and Treatment Rehabilitation  SUBJECTIVE: "I hope I can remember these. (WRAP)"  PAIN:  Are you having pain? No  Pt attended therapy by herself today.   OBJECTIVE:   TODAY'S TREATMENT:  10/11/21: SLP started with review of "WRAP" memory strategies which pt recalled with extra time. As session  progressed pt became more and more distracted today due to a reported argument last night with Alinda Money. Very perseverative on this, needing multiple redirections during session eventually needing continual redirection. SLP could not administer cognitive PROM due to pt inability to grade/rate the stimulus areas without tangential topics.   10/04/21: Pt did not reportedly recall anything except "red ruby ring, a box, and a pocket" from previous sessions. SLP educated pt and husband on compensations for memory. With multiple repeats (x4) in 15 minutes pt was able to recall 1/4, incr'd to 2/4 with extra time and incr'd to 3/4 with min A. Pt, notably, recalled details about her rose bush, and current events regarding her son and grandson without difficulty. SLP strongly suspects psychological basis to pt's deficits, which pt acknowledged during session today as well. Pt with much tangential conversation. SLP educated pt and husband about how to maximize memory compensations at home -mostly using writing which appeared to be, from pt and husband report, pt's premorbid means to improve recall. They have calendar at home now which pt reportedly writes all appointments/has all appointments written. Pt homework to write errand stops and/or lists for items to purchase whenever they go anywhere.  09/29/21: Skilled ST focused  on targeting cognition-communication this session. Pt was unable to recall any information spoken about in previous session and reports she did not really recognize therapist (though therapist has seen her 3x). She did, however, perseverate on "ruby red ring" when she entered the room and seemed to be anxious regarding her memory from the story. SLP reviewed strategies from previous session due to pt limited memory. SLP thoroughly explained each strategy and provided examples for each. Pt had difficulty comprehending verbally-provided information, appearing visibly confused and looking at husband. SLP then  had pt repeat back for confirmation what was heard. Pt was only able to provide 1 detail on average of what SLP had said. Pt would benefit from repeating back what was heard to ensure comprehension of information. Cont with attention strategies next session.    09/22/21: Pt completed Neuro QOL - Cognitive Function this session (107/140). Pt had several questions regarding how to answer the questions correctly and required further explanation for increased comprehension of written information. SLP and pt discussed pt's impairment in topic maintenance. Pt will begin answering the proposed question, then veer into a story and lose train of thought. Began discussion of attention strategies and how our cognition can be impacted by internal feelings (anxiety, depression etc.). Pt required further explanation of each separate type of attention, as she had difficulty providing examples to demonstrate understanding of each type. Will continue to discuss next session.    PATIENT EDUCATION: Education details: see above in "today's treatment" Person educated: Patient and Spouse Education method: Explanation and Demonstration Education comprehension: verbalized understanding and needs further education         GOALS: Goals reviewed with patient? Yes   SHORT TERM GOALS: Target date: 10/13/2021     Pt will verbalize 3 memory strategies to assist with recall of important information across 3 sessions. Baseline: 10-06-21, 10-11-21 Goal status: Ongoing   2.  Pt will verbalize 3 attention strategies to increase focus and comprehension during conversations across 3 sessions.  Baseline:  Goal status: Ongoing   3.  Pt will complete cognitive PROM Baseline:  Goal status: Ongoing     LONG TERM GOALS: Target date: 11/10/2021     Pt will demonstrate and/or report successful use of memory strategies to recall important information. Baseline:  Goal status: Ongoing   2.  Pt will demonstrate and/or report  successful use of 3 attention strategies to increase focus comprehension during conversations. Baseline:  Goal status: Ongoing       ASSESSMENT:   CLINICAL IMPRESSION: Pt is a 76 yo female who presents to [facility] for tx, with diagnosis of COVID long-hauler. Today pt extremely distracted as session progressed due to reported argument with husband last night. See tx note. SLP cont to rec skilled ST services to address cognitive-communication impairment to increase confidence and maximize functional communication. Suspect basis of the majority of pt's sx are psychological.     OBJECTIVE IMPAIRMENTS include attention and memory. These impairments are limiting patient from managing medications, managing appointments, managing finances, and effectively communicating at home and in community. Factors affecting potential to achieve goals and functional outcome are  levels of anxiety .Marland Kitchen Patient will benefit from skilled SLP services to address above impairments and improve overall function.   REHAB POTENTIAL: Good   PLAN: SLP FREQUENCY: 2x/week   SLP DURATION: 8 weeks   PLANNED INTERVENTIONS: Environmental controls, Cueing hierachy, Internal/external aids, Functional tasks, SLP instruction and feedback, Compensatory strategies, and Patient/family education     [place], CCC-SLP 10/11/2021,  2:20 PM

## 2021-10-11 NOTE — Therapy (Signed)
OUTPATIENT PHYSICAL THERAPY TREATMENT NOTE   Patient Name: Dawn Simmons MRN: [id number] DOB:[birth year], 76 y.o., female Today's Date: 10/11/2021  PCP: Gwyneth Revels, MD REFERRING PROVIDER: Lynne Leader, MD  END OF SESSION:   PT End of Session - 10/11/21 1325     Visit Number 8    Number of Visits 9    Date for PT Re-Evaluation 10/12/21    Authorization Type Medicare    PT Start Time 6761    PT Stop Time 1402    PT Time Calculation (min) 40 min    Activity Tolerance Patient tolerated treatment well;No increased pain    Behavior During Therapy [facility] for tasks assessed/performed                  Past Medical History:  Diagnosis Date   H/O fibromyalgia    H/O insomnia    H/O measles    H/O rubella    H/O syncope    History of chicken pox 09/15/2019   History of palpitations    Hx of mumps    Mitral valve prolapse    Palpitations    Syncope    Von Willebrand disease ([place])    Past Surgical History:  Procedure Laterality Date   NO PAST SURGERIES     Patient Active Problem List   Diagnosis Date Noted   Low back pain 09/08/2021   Allergies 08/04/2020   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   Right groin pain 01/10/2020   Right sided weakness 01/10/2020   Left shoulder pain 10/31/2019   Leukopenia 09/15/2019   PTSD (post-traumatic stress disorder) 09/15/2019   History of chicken pox 09/15/2019   Von Willebrand disease ([place])    Upper airway cough syndrome 09/06/2018   Fibromyalgia 06/01/2015   Insomnia 06/01/2015   H/O syncope    Palpitations    Mitral valve prolapse     REFERRING DIAG: M54.41 (ICD-10-CM) - Acute right-sided low back pain with right-sided sciatica  THERAPY DIAG:  Muscle weakness (generalized)  Acute right-sided low back pain with right-sided sciatica  Rationale for Evaluation and Treatment Rehabilitation  PERTINENT HISTORY:  chronic L shoulder pain, bilateral greater trochanteric bursitis, cognitive changes noted at 09/10/21 MD visit),  fibromyalgia, mitral valve prolapse  PRECAUTIONS: Fall  SUBJECTIVE: Some good days and some bad days. Pt comes into therapy without husband today.  Pt tearful with beginning of session, "family issues."  Pt does report no pain in her leg, as before.  PAIN:  Are you having pain? Yes: NPRS scale: 0/10 Pain location: back Pain description: fatigue Aggravating factors: sitting too long aggravates Relieving factors: ice, heat   OBJECTIVE:    TODAY'S TREATMENT: 10/11/2021 Activity Comments  NuStep, lower extremities only, L1 x 6 minutes Cues to keep steps/min >50, pt able to keep from 60-70  Reviewed HEP as below:  Pt needs verbal cues and picture cues of HEP  Quadruped alt leg kicks, 5 reps each side Cues for abdominal activation and neutral spine  Standing wide BOS lateral rocking/reaching x 10 reps each side Cues for technique  Standing minisquat >lateral step-out 2 x 5 reps Cues for technique      Exercises-Reviewed HEP below, with pt needing cues from therapist and picture cues - Hooklying Single Knee to Chest Stretch  - 1-2 x daily - 5 x weekly - 1 sets - 3 reps - 15 sec hold - Supine Lower Trunk Rotation  - 1-2 x daily - 7 x weekly - 1 sets - 10 reps -  Supine Anterior Pelvic Tilt  - 1 x daily - 5 x weekly - 1 sets - 5-10 reps - 3-5 sec  hold - Quadruped Rocking Slow  - 1 x daily - 7 x weekly - 1 sets - 5-10 reps - Quadruped Alternating Shoulder Flexion  - 1 x daily - 7 x weekly - 2 sets - 5 reps - Standing Hip Abduction with Counter Support  - 1 x daily - 5 x weekly - 2 sets - 10 reps - Standing Hip Extension with Counter Support  - 1 x daily - 5 x weekly - 2 sets - 10 reps - Standing Bilateral Low Shoulder Row with Anchored Resistance  - 1 x daily - 5 x weekly - 2 sets - 10 reps - Shoulder extension with resistance - Neutral  - 1 x daily - 5 x weekly - 2 sets - 10 reps   PATIENT EDUCATION: Education details: Posture/body Office manager during gardening, household tasks Person  educated: Patient Education method: Consulting civil engineer, Media planner, and Handouts Education comprehension: verbalized understanding, returned demonstration, verbal cues required, and needs further education   HOME EXERCISE PROGRAM Last updated: 09/29/21 Access Code: [email or web address] URL: [email or web address] Date: 09/29/2021 Prepared by: [facility]  Exercises - Hooklying Single Knee to Chest Stretch  - 1-2 x daily - 5 x weekly - 1 sets - 3 reps - 15 sec hold - Supine Lower Trunk Rotation  - 1-2 x daily - 7 x weekly - 1 sets - 10 reps - Supine Anterior Pelvic Tilt  - 1 x daily - 5 x weekly - 1 sets - 5-10 reps - 3-5 sec  hold - Quadruped Rocking Slow  - 1 x daily - 7 x weekly - 1 sets - 5-10 reps - Quadruped Alternating Shoulder Flexion  - 1 x daily - 7 x weekly - 2 sets - 5 reps - Standing Hip Abduction with Counter Support  - 1 x daily - 5 x weekly - 2 sets - 10 reps - Standing Hip Extension with Counter Support  - 1 x daily - 5 x weekly - 2 sets - 10 reps - Standing Bilateral Low Shoulder Row with Anchored Resistance  - 1 x daily - 5 x weekly - 2 sets - 10 reps - Shoulder extension with resistance - Neutral  - 1 x daily - 5 x weekly - 2 sets - 10 reps  ___________________________________ From EVAL:  (objective measures completed at initial evaluation unless otherwise dated)09/14/2021   DIAGNOSTIC FINDINGS: See MRI results   COGNITION: Overall cognitive status: Impaired: see notes in chart-speech therapy referral on chart.               MUSCLE LENGTH: Hamstrings: Right -38 deg from full extension; Left -30 deg from full extension SLR:  LLE 68, 52 RLE prior to onset of pain   POSTURE: rounded shoulders   LOWER EXTREMITY ROM:   See above     LOWER EXTREMITY MMT:     MMT Right Eval Left Eval  Hip flexion 3+/5 with pain 4/5  Hip extension      Hip abduction      Hip adduction      Hip internal rotation      Hip external rotation       Knee flexion 3/5 (at least-guarded due to discomfort) 5/5  Knee extension 3/5 (at least-guarded due to discomfort) 5/5  Ankle dorsiflexion 4/5 5/5  Ankle plantarflexion  Ankle inversion      Ankle eversion      (Blank rows = not tested)   BED MOBILITY:  Sit to supine SBA Supine to sit SBA Guarded with motion:  goes sit>long sit to supine TRANSFERS: Assistive device utilized: None  Sit to stand: SBA Stand to sit: SBA Guarded with motion   GAIT: Gait pattern: step through pattern and antalgic Distance walked: 40 ft x 2 Assistive device utilized: None Level of assistance: SBA Comments: Slowed, guarded gait pattern; slow to initiate gait   FUNCTIONAL TESTs:  Palpation:  Tender to palpation along paraspinal musculature R lumbar spine. Functional movements:  With [facility] (lumbar flexion), pt reports decreased radicular symptoms into R thigh/more centralized symptoms. Pain with SLR test in R buttocks into R posterior thigh.     TODAY'S TREATMENT:   Initiated HEP-see below.  Pt performs exercises below with cues for slowed pace, movement in painfree ranges. Access Code: [email or web address] URL: [email or web address] Date: 09/13/2021 Prepared by: [facility]   Exercises - Hooklying Single Knee to Chest Stretch  - 1-2 x daily - 5 x weekly - 1 sets - 3 reps - 15 sec hold - Supine Posterior Pelvic Tilt  - 1-2 x daily - 7 x weekly - 1 sets - 10 reps - Supine Lower Trunk Rotation  - 1-2 x daily - 7 x weekly - 1 sets - 10 reps     PATIENT EDUCATION: Education details: PT eval results, POC, initiated HEP Person educated: Patient and Spouse Education method: Explanation, Demonstration, Verbal cues, and Handouts Education comprehension: verbalized understanding, returned demonstration, verbal cues required, needs further education, and will need husband's assist/supervision due to hx of memory issues     HOME EXERCISE PROGRAM: Med Bridge  Access Code [email or web address]   ___________________________________________________________________________________    GOALS: Goals reviewed with patient? Yes   SHORT TERM GOALS:  (Equal LTGs)   LONG TERM GOALS: Target date: 10/12/2021   Pt will perform HEP with supervision for decreased pain, improved functional mobility. Baseline: Pain rating at eval 5/10 with radiating pain into R posterior thigh Goal status: GOAL MET, reports performing almost daily with husband assist   2.  Pt will report at least 50% reduction in low back and radicular pain for improved overall functional mobiltiy. Baseline:  Goal status: GOAL MET, no c/o pain today    3.  Pt will improve RLE strength to at least 4/5 to improve ability for gait and transfers. Baseline:  Goal status: IN PROGRESS-ONGOING 4.  Pt/husband will verbalize understanding of posture/body mechanics to reduce future injury and reduce pain.            Baseline:            Goal status:  IN PROGRESS-ONGOING   ASSESSMENT:   CLINICAL IMPRESSION:  Patient arrived to session being tearful, due to "family issues."  No c/o back pain today during session.  She is not having c/o radiating pain in lower extremities, only fatigue with exercises.  Began to assess LTGs, and pt has met LTG 1 and 2 thus far.  Reviewed full HEP today, and pt will likely continue to need cues and assistance due to her memory issues.  Educated patient on optimal body mechanics and posture with gardening and ADLs; will benefit from additional education/reinforcement next session, as husband not here during today's session.  No complaints at end of session.    OBJECTIVE IMPAIRMENTS decreased mobility, difficulty walking, decreased ROM,  decreased strength, impaired flexibility, postural dysfunction, and pain.    ACTIVITY LIMITATIONS bending, sitting, standing, squatting, transfers, and locomotion level   PARTICIPATION LIMITATIONS: shopping, community activity, and church   PERSONAL  FACTORS chronic L shoulder pain, bilateral greater trochanteric bursitis, cognitive changes noted at 09/10/21 MD visit), fibromyalgia, mitral valve prolapse, hx of COVID are also affecting patient's functional outcome.    REHAB POTENTIAL: Good   CLINICAL DECISION MAKING: Evolving/moderate complexity   EVALUATION COMPLEXITY: Moderate   PLAN: PT FREQUENCY: 2x/week   PT DURATION: 4 weeks plus eval   PLANNED INTERVENTIONS: Therapeutic exercises, Therapeutic activity, Neuromuscular re-education, Gait training, Patient/Family education, Joint mobilization, Electrical stimulation, Cryotherapy, Moist heat, Ultrasound, and Manual therapy   PLAN FOR NEXT SESSION: Assess remaining LTGs and plan for d/c next visit.  Will need to recert/discharge.      Mady Haagensen, PT 10/11/21 3:17 PM Phone: [phone] Fax: [phone]  [facility] at [facility] [place], [place], [place] [address] Phone # [phone] Fax # [phone]

## 2021-10-13 ENCOUNTER — Ambulatory Visit: Payer: Medicare Other

## 2021-10-13 ENCOUNTER — Ambulatory Visit: Payer: Medicare Other | Admitting: Physical Therapy

## 2021-10-13 ENCOUNTER — Encounter: Payer: Self-pay | Admitting: Physical Therapy

## 2021-10-13 DIAGNOSIS — R41841 Cognitive communication deficit: Secondary | ICD-10-CM | POA: Diagnosis not present

## 2021-10-13 DIAGNOSIS — M5441 Lumbago with sciatica, right side: Secondary | ICD-10-CM

## 2021-10-13 DIAGNOSIS — M6281 Muscle weakness (generalized): Secondary | ICD-10-CM | POA: Diagnosis not present

## 2021-10-13 NOTE — Therapy (Signed)
OUTPATIENT PHYSICAL THERAPY TREATMENT NOTE   Patient Name: Dawn Simmons MRN: [id number] DOB:1945/06/07, 76 y.o., female Today's Date: 10/13/2021  PCP: Gwyneth Revels, MD REFERRING PROVIDER: Lynne Leader, MD       10/13/21 1536  PT Visits / Re-Eval  Visit Number 9  Number of Visits 12  Date for PT Re-Evaluation 11/12/21  Authorization  Authorization Type Medicare  PT Time Calculation  PT Start Time 1536  PT Stop Time 1615  PT Time Calculation (min) 39 min  PT - End of Session  Activity Tolerance Patient tolerated treatment well (tired and a little achy at end)  Behavior During Therapy [facility] for tasks assessed/performed        Past Medical History:  Diagnosis Date   H/O fibromyalgia    H/O insomnia    H/O measles    H/O rubella    H/O syncope    History of chicken pox 09/15/2019   History of palpitations    Hx of mumps    Mitral valve prolapse    Palpitations    Syncope    Von Willebrand disease ([place])    Past Surgical History:  Procedure Laterality Date   NO PAST SURGERIES     Patient Active Problem List   Diagnosis Date Noted   Low back pain 09/08/2021   Allergies 08/04/2020   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   Right groin pain 01/10/2020   Right sided weakness 01/10/2020   Left shoulder pain 10/31/2019   Leukopenia 09/15/2019   PTSD (post-traumatic stress disorder) 09/15/2019   History of chicken pox 09/15/2019   Von Willebrand disease ([place])    Upper airway cough syndrome 09/06/2018   Fibromyalgia 06/01/2015   Insomnia 06/01/2015   H/O syncope    Palpitations    Mitral valve prolapse     REFERRING DIAG: M54.41 (ICD-10-CM) - Acute right-sided low back pain with right-sided sciatica  THERAPY DIAG:  Muscle weakness (generalized)  Acute right-sided low back pain with right-sided sciatica  Rationale for Evaluation and Treatment Rehabilitation  PERTINENT HISTORY:  chronic L shoulder pain, bilateral greater trochanteric bursitis, cognitive changes  noted at 09/10/21 MD visit), fibromyalgia, mitral valve prolapse  PRECAUTIONS: Fall  SUBJECTIVE: Overdid it yesterday with the yardwork. Had to help husband remove rose bushes, so I'm sore.  PAIN:  Are you having pain? Yes: NPRS scale: 4/10 Pain location: back Pain description: fatigue Aggravating factors: sitting too long aggravates, overwork in yard Relieving factors: ice, heat   OBJECTIVE:    TODAY'S TREATMENT: 10/13/2021 Activity Comments  Assessed BLE strength-see below in bold Improved RLE strength and no radicular pain with MMT, as at eval  Reviewed posture and body mechanics discussion and education from last visit, pt reports not remembering.  Performed squatting tasks with cues for correct technique to simulate gardening Performed tall kneel>1/2 kneel  positions, to simulate getting up from ground/gardening positions   Performed supine exercises to help with low back soreness today, reinforcing HEP: SKTC x 3 reps Supine trunk rotation stretch Ant/post pelvic tilts Quadruped position-finding neutral spine, alt UE lifts              PATIENT EDUCATION: Education details: Verbally reviewed other parts of HEP; discussed POC and plans to finish PT today, but keep chart open in case she has further needs in the next few weeks Person educated: Patient and Spouse (husband not present in session, but PT wrote out instructions and followed up with husband after PT session) Education method: Explanation and Handouts Education comprehension:  verbalized understanding  LOWER EXTREMITY MMT:     MMT Right Eval Right 10/13/2021 Left Eval Left 10/13/2021  Hip flexion 3+/5 with pain 4/5 No pain 4/5 4/5  Hip extension        Hip abduction        Hip adduction        Hip internal rotation        Hip external rotation        Knee flexion 3/5 (at least-guarded due to discomfort) 4/5 5/5 5/5  Knee extension 3/5 (at least-guarded due to discomfort) 4/5 5/5 5/5  Ankle dorsiflexion 4/5  4/5 5/5 5/5  Ankle plantarflexion        Ankle inversion        Ankle eversion           HOME EXERCISE PROGRAM Last updated: 09/29/21 Access Code: [email or web address] URL: [email or web address] Date: 09/29/2021 Prepared by: [facility]  Exercises - Hooklying Single Knee to Chest Stretch  - 1-2 x daily - 5 x weekly - 1 sets - 3 reps - 15 sec hold - Supine Lower Trunk Rotation  - 1-2 x daily - 7 x weekly - 1 sets - 10 reps - Supine Anterior Pelvic Tilt  - 1 x daily - 5 x weekly - 1 sets - 5-10 reps - 3-5 sec  hold - Quadruped Rocking Slow  - 1 x daily - 7 x weekly - 1 sets - 5-10 reps - Quadruped Alternating Shoulder Flexion  - 1 x daily - 7 x weekly - 2 sets - 5 reps - Standing Hip Abduction with Counter Support  - 1 x daily - 5 x weekly - 2 sets - 10 reps - Standing Hip Extension with Counter Support  - 1 x daily - 5 x weekly - 2 sets - 10 reps - Standing Bilateral Low Shoulder Row with Anchored Resistance  - 1 x daily - 5 x weekly - 2 sets - 10 reps - Shoulder extension with resistance - Neutral  - 1 x daily - 5 x weekly - 2 sets - 10 reps  ___________________________________ From EVAL:  (objective measures completed at initial evaluation unless otherwise dated)09/14/2021   DIAGNOSTIC FINDINGS: See MRI results   COGNITION: Overall cognitive status: Impaired: see notes in chart-speech therapy referral on chart.               MUSCLE LENGTH: Hamstrings: Right -38 deg from full extension; Left -30 deg from full extension SLR:  LLE 68, 52 RLE prior to onset of pain   POSTURE: rounded shoulders   LOWER EXTREMITY ROM:   See above     LOWER EXTREMITY MMT:     MMT Right Eval Left Eval  Hip flexion 3+/5 with pain 4/5  Hip extension      Hip abduction      Hip adduction      Hip internal rotation      Hip external rotation      Knee flexion 3/5 (at least-guarded due to discomfort) 5/5  Knee extension 3/5 (at least-guarded due to  discomfort) 5/5  Ankle dorsiflexion 4/5 5/5  Ankle plantarflexion      Ankle inversion      Ankle eversion      (Blank rows = not tested)   BED MOBILITY:  Sit to supine SBA Supine to sit SBA Guarded with motion:  goes sit>long sit to supine TRANSFERS: Assistive device utilized: None  Sit to stand:  SBA Stand to sit: SBA Guarded with motion   GAIT: Gait pattern: step through pattern and antalgic Distance walked: 40 ft x 2 Assistive device utilized: None Level of assistance: SBA Comments: Slowed, guarded gait pattern; slow to initiate gait   FUNCTIONAL TESTs:  Palpation:  Tender to palpation along paraspinal musculature R lumbar spine. Functional movements:  With [facility] (lumbar flexion), pt reports decreased radicular symptoms into R thigh/more centralized symptoms. Pain with SLR test in R buttocks into R posterior thigh.     TODAY'S TREATMENT:   Initiated HEP-see below.  Pt performs exercises below with cues for slowed pace, movement in painfree ranges. Access Code: [email or web address] URL: [email or web address] Date: 09/13/2021 Prepared by: [facility]   Exercises - Hooklying Single Knee to Chest Stretch  - 1-2 x daily - 5 x weekly - 1 sets - 3 reps - 15 sec hold - Supine Posterior Pelvic Tilt  - 1-2 x daily - 7 x weekly - 1 sets - 10 reps - Supine Lower Trunk Rotation  - 1-2 x daily - 7 x weekly - 1 sets - 10 reps     PATIENT EDUCATION: Education details: PT eval results, POC, initiated HEP Person educated: Patient and Spouse Education method: Explanation, Demonstration, Verbal cues, and Handouts Education comprehension: verbalized understanding, returned demonstration, verbal cues required, needs further education, and will need husband's assist/supervision due to hx of memory issues     HOME EXERCISE PROGRAM: Med Bridge Access Code [email or web address]   ___________________________________________________________________________________     GOALS: Goals reviewed with patient? Yes   SHORT TERM GOALS:  (Equal LTGs)   LONG TERM GOALS: Target date: 10/12/2021>Updated TARGET 11/12/2021   Pt will perform HEP with supervision for decreased pain, improved functional mobility. Baseline: Pain rating at eval 5/10 with radiating pain into R posterior thigh Goal status: GOAL MET, reports performing almost daily with husband assist   2.  Pt will report at least 50% reduction in low back and radicular pain for improved overall functional mobiltiy. Baseline:  Goal status: GOAL MET, no c/o pain today    3.  Pt will improve RLE strength to at least 4/5 to improve ability for gait and transfers. Baseline:  Goal status: GOAL MET, 10/13/2021  4.  Pt/husband will verbalize understanding of posture/body mechanics to reduce future injury and reduce pain.            Baseline:  Pt does not remember, husband not present for sessions this week.            Goal status:   GOAL NOT MET, 10/13/2021; GOAL ONGOING ASSESSMENT:   CLINICAL IMPRESSION:  Assessed remaining LTGs this visit, with pt meeting LTG 3 for improved RLE strength with no radicular pain symptoms.  LTG 4 not met, as pt does not remember education from previous session and husband not present for education either session this week.  Overall, patient's pain seems to be lessening, she does report (and husband endorses) consistent HEP performance; however, pt continues to seem to be performing repetitive tasks with less than optimal posture, which may be contributing to some back and leg pain at times.  Will plan to recert for 4 additional weeks in the case that pt needs to return for reinforcement/updates to HEP if pain returns during this time; otherwise, will plan to discharge at that time.   OBJECTIVE IMPAIRMENTS decreased mobility, difficulty walking, decreased ROM, decreased strength, impaired flexibility, postural dysfunction, and pain.    ACTIVITY  LIMITATIONS bending, sitting, standing,  squatting, transfers, and locomotion level   PARTICIPATION LIMITATIONS: shopping, community activity, and church   PERSONAL FACTORS chronic L shoulder pain, bilateral greater trochanteric bursitis, cognitive changes noted at 09/10/21 MD visit), fibromyalgia, mitral valve prolapse, hx of COVID are also affecting patient's functional outcome.    REHAB POTENTIAL: Good   CLINICAL DECISION MAKING: Evolving/moderate complexity   EVALUATION COMPLEXITY: Moderate   PLAN: PT FREQUENCY: 2x/week   PT DURATION: 4 weeks plus eval   PLANNED INTERVENTIONS: Therapeutic exercises, Therapeutic activity, Neuromuscular re-education, Gait training, Patient/Family education, Joint mobilization, Electrical stimulation, Cryotherapy, Moist heat, Ultrasound, and Manual therapy   PLAN FOR NEXT SESSION:  Pt on hold, recert completed for 4 weeks in case pt needs to come back in for re-education/updates to HEP.  If not, plan to d/c after 4 weeks.  Mady Haagensen, PT 10/13/21 3:38 PM Phone: [phone] Fax: [phone]  [facility] at [facility] [place], [place], [place] [address] Phone # [phone] Fax # [phone]

## 2021-10-13 NOTE — Patient Instructions (Signed)
We are going to plan to finish with PT sessions today.  I will keep your chart open until 11/12/21, in case you need to return for 1-2 visits.    Overall, you have reported your pain is decreased without pain radiating down your leg.  Please continue your exercises DAILY to work on low back flexibility, stability, and leg strength, as well as postural strength.  Contact us if you need to schedule additional appointments before 11/12/2021 (if you had additional pain, or if your exercises get easy and you need to progress).

## 2021-10-13 NOTE — Therapy (Signed)
OUTPATIENT SPEECH LANGUAGE PATHOLOGY TREATMENT NOTE   Patient Name: Dawn Simmons MRN: [id number] DOB:22-Apr-1945, 76 y.o., female Today's Date: 10/13/2021  PCP: Mosie Lukes, MD REFERRING PROVIDER: Gregor Hams, MD  END OF SESSION:      Past Medical History:  Diagnosis Date   H/O fibromyalgia    H/O insomnia    H/O measles    H/O rubella    H/O syncope    History of chicken pox 09/15/2019   History of palpitations    Hx of mumps    Mitral valve prolapse    Palpitations    Syncope    Von Willebrand disease ([place])    Past Surgical History:  Procedure Laterality Date   NO PAST SURGERIES     Patient Active Problem List   Diagnosis Date Noted   Low back pain 09/08/2021   Allergies 08/04/2020   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   Right groin pain 01/10/2020   Right sided weakness 01/10/2020   Left shoulder pain 10/31/2019   Leukopenia 09/15/2019   PTSD (post-traumatic stress disorder) 09/15/2019   History of chicken pox 09/15/2019   Von Willebrand disease ([place])    Upper airway cough syndrome 09/06/2018   Fibromyalgia 06/01/2015   Insomnia 06/01/2015   H/O syncope    Palpitations    Mitral valve prolapse     ONSET DATE:  "A few years", since I got Covid.    REFERRING DIAG:  R41.0 (ICD-10-CM) - Subacute confusional state   THERAPY DIAG: No diagnosis found.  Rationale for Evaluation and Treatment Rehabilitation  SUBJECTIVE: "I'm not sure I can remember these. (WRAP); 25 seconds later: "I got them!" 6 seconds later: "I lost them again."  PAIN:  Are you having pain? No  Pt attended therapy by herself today.   OBJECTIVE:   TODAY'S TREATMENT: 10/13/21: Pt began session telling her discomfort with a small hole in her pants. This distracted her numerous times during the session today.  [name] was notably anxious after S statement. SLP assured pt that there was no judgment for pt if she did not recall memory strategies right away. SLP suggested she write down "W"  "R" "A" "P" and she recalled the 4 strategies right away. SLP attempted to perform a PROM with pt and she req'd mod A consistently due to off-topic conversation. [name] scored 22/32 (higher score indicates better QOL), with most difficulty learning new tasks, trouble concentrating, paying attention in order to be accurate, slower thinking, and reading something several times to understand it.  To target sustained attention, SLP provided pt with a word search. She demonstrated heavy audible sigh after 90 seconds saying "I'm stuck. I looked down to see if it was a word and now I'm stuck," and sighed again. SLP provided pt with hot pink post it pad and pt moved through two lines and breathed another heavy sigh saying "I'm lost. I dont' know how I'm lost. I truly don't know what to do." SLP cued pt to move back up two-three lines and cont the search. Occasionally pt would find an animal (e.g., gnu, cow) and checked to see if it was a word to cross off - SLP reminded pt this puzzle indicated [name] names. This explanation did not change pt's behavior x3. SLP explained to pt husband following session that anxiety is a hindering factor for pt.  10/11/21: SLP started with review of "WRAP" memory strategies which pt recalled with extra time. As session progressed pt became more and more distracted  today due to a reported argument last night with [name]. Very perseverative on this, needing multiple redirections during session eventually needing continual redirection. SLP could not administer cognitive PROM due to pt inability to grade/rate the stimulus areas without tangential topics.   10/04/21: Pt did not reportedly recall anything except "red ruby ring, a box, and a pocket" from previous sessions. SLP educated pt and husband on compensations for memory. With multiple repeats (x4) in 15 minutes pt was able to recall 1/4, incr'd to 2/4 with extra time and incr'd to 3/4 with min A. Pt, notably, recalled details about her rose  bush, and current events regarding her son and grandson without difficulty. SLP strongly suspects psychological basis to pt's deficits, which pt acknowledged during session today as well. Pt with much tangential conversation. SLP educated pt and husband about how to maximize memory compensations at home -mostly using writing which appeared to be, from pt and husband report, pt's premorbid means to improve recall. They have calendar at home now which pt reportedly writes all appointments/has all appointments written. Pt homework to write errand stops and/or lists for items to purchase whenever they go anywhere.  09/29/21: Skilled ST focused on targeting cognition-communication this session. Pt was unable to recall any information spoken about in previous session and reports she did not really recognize therapist (though therapist has seen her 3x). She did, however, perseverate on "ruby red ring" when she entered the room and seemed to be anxious regarding her memory from the story. SLP reviewed strategies from previous session due to pt limited memory. SLP thoroughly explained each strategy and provided examples for each. Pt had difficulty comprehending verbally-provided information, appearing visibly confused and looking at husband. SLP then had pt repeat back for confirmation what was heard. Pt was only able to provide 1 detail on average of what SLP had said. Pt would benefit from repeating back what was heard to ensure comprehension of information. Cont with attention strategies next session.    09/22/21: Pt completed Neuro QOL - Cognitive Function this session (107/140). Pt had several questions regarding how to answer the questions correctly and required further explanation for increased comprehension of written information. SLP and pt discussed pt's impairment in topic maintenance. Pt will begin answering the proposed question, then veer into a story and lose train of thought. Began discussion of attention  strategies and how our cognition can be impacted by internal feelings (anxiety, depression etc.). Pt required further explanation of each separate type of attention, as she had difficulty providing examples to demonstrate understanding of each type. Will continue to discuss next session.    PATIENT EDUCATION: Education details: see above in "today's treatment" Person educated: Patient and Spouse Education method: Explanation and Demonstration Education comprehension: verbalized understanding and needs further education         GOALS: Goals reviewed with patient? Yes   SHORT TERM GOALS: Target date: 10/13/2021     Pt will verbalize 3 memory strategies to assist with recall of important information across 3 sessions. Baseline: 10-06-21, 10-11-21 Goal status: Met 10-13-21   2.  Pt will verbalize 3 attention strategies to increase focus and comprehension during conversations across 3 sessions.  Baseline:  Goal status: Ongoing   3.  Pt will complete cognitive PROM Baseline:  Goal status: Met 10-13-21     LONG TERM GOALS: Target date: 11/10/2021     Pt will demonstrate and/or report successful use of memory strategies to recall important information. Baseline:  Goal status: Ongoing  2.  Pt will demonstrate and/or report successful use of 3 attention strategies to increase focus comprehension during conversations. Baseline:  Goal status: Ongoing       ASSESSMENT:   CLINICAL IMPRESSION: Pt is a 76 yo female who presents to [facility] for tx, with diagnosis of COVID long-hauler. Today pt again distracted with multiple items listen in tx note. SLP cont to rec skilled ST services to address cognitive-communication impairment to increase confidence and maximize functional communication. Suspect basis of the majority of pt's sx are psychological - if pt's anxiety does not decr, SLP questions efficacy of ST at this time and d/c might be more appropriate until pt's anxiety can be better managed.      OBJECTIVE IMPAIRMENTS include attention and memory. These impairments are limiting patient from managing medications, managing appointments, managing finances, and effectively communicating at home and in community. Factors affecting potential to achieve goals and functional outcome are  levels of anxiety .Marland Kitchen Patient will benefit from skilled SLP services to address above impairments and improve overall function.   REHAB POTENTIAL: Good   PLAN: SLP FREQUENCY: 2x/week   SLP DURATION: 8 weeks   PLANNED INTERVENTIONS: Environmental controls, Cueing hierachy, Internal/external aids, Functional tasks, SLP instruction and feedback, Compensatory strategies, and Patient/family education     [facility], [place] 10/13/2021, 4:23 PM

## 2021-10-14 ENCOUNTER — Ambulatory Visit (INDEPENDENT_AMBULATORY_CARE_PROVIDER_SITE_OTHER): Payer: Medicare Other | Admitting: Psychology

## 2021-10-14 DIAGNOSIS — F4310 Post-traumatic stress disorder, unspecified: Secondary | ICD-10-CM | POA: Diagnosis not present

## 2021-10-15 NOTE — Progress Notes (Signed)
Behavioral Health Counselor/Therapist Progress Note  Patient ID: Dawn Simmons, MRN: [id number],    Date: 10/14/2021  Time Spent: 60 minutes  Treatment Type: Individual Therapy  Reported Symptoms: anxiety and depression and memory loss  Mental Status Exam: Appearance:  Casual     Behavior: Appropriate  Motor: Normal  Speech/Language:  Normal Rate  Affect: Blunt  Mood: pleasant  Thought process: normal  Thought content:   WNL  Sensory/Perceptual disturbances:   WNL  Orientation: oriented to person, place, time/date, and situation  Attention: Good  Concentration: Good  Memory: WNL  Fund of knowledge:  Good  Insight:   Good  Judgment:  Good  Impulse Control: Good   Risk Assessment: Danger to Self:  No Self-injurious Behavior: No Danger to Others: No Duty to Warn:no Physical Aggression / Violence:No  Access to Firearms a concern: No  Gang Involvement:No   Subjective: The patient attended a face-to-face individual therapy session in the office today.  The patient presents with a blunted affect and mood is pleasant.  The patient reports that she has struggled over the last week with her emotions.  She states that she became very angry with her husband and her son.  We talked about this possibly being related to the stress of waiting to see what is going to happen with her grandson.  Apparently the patient had "a temper tantrum" and it was to the point where her son came back to her and apologized.  The patient is stressed right now waiting for the results of what is going to happen in court.  We talked about positive things and I did some reframing for her.   Interventions: Cognitive Behavioral Therapy and Eye Movement Desensitization and Reprocessing (EMDR)  Diagnosis:PTSD (post-traumatic stress disorder)  Plan: Treatment Plan  Strengths/Abilities:Insightful, motivated, supportive husband  Treatment Preferences: Outpatient Individual therapy  Statement of  Needs: "I have a problem passing out and I was told I have PTSD"    Symptoms:  Demonstrates an exaggerated startle response.:(Status: improved). Depressed  or irritable mood.: (Status: improved). Describes a reliving of the event,  particularly through dissociative flashbacks.:  (Status: improved). Displays a  significant decline in interest and engagement in activities.: (Status: improved). Displays significant psychological and/or physiological distress resulting from internal and external  clues that are reminiscent of the traumatic event.: (Status: improved).  Experiences disturbances in sleep.: (Status: improved). Experiences disturbing  and persistent thoughts, images, and/or perceptions of the traumatic event.: (Status: improved). Experiences frequent nightmares.: (Status: improved).  Feelings of hopelessness, worthlessness, or inappropriate guilt.: (Status:  improved). Has been exposed to a traumatic event involving actual or perceived threat of death or  serious injury.: (Status: maintained). Impairment in social, occupational, or  other areas of functioning.: (Status: improved). Intentionally avoids activities,  places, people, or objects (e.g., up-armored vehicles) that evoke memories of the event.:(Status: maintained). Intentionally avoids thoughts, feelings, or discussions related  to the traumatic event.: (Status: improved). Reports difficulty concentrating as  well as feelings of guilt.:  (Status: improved). Reports response of intense fear,  helplessness, or horror to the traumatic event.:  (Status: improved).  Problems Addressed: PTSD   Goals:  LTG:  1. Develop healthy thinking patterns and beliefs about self, others, and the world that lead to the alleviation and help prevent the relapse of  depression.  50% Objective Identify and replace thoughts and beliefs that support depression.  50 % Target Date: 2022-05-01 Frequency: Weekly Progress: 50 Modality: individual 2.  Eliminate or reduce the negative  impact trauma related symptoms have  on social, occupational, and family functioning.  50% Objective Learn and implement personal skills to manage challenging situations related to trauma. Target Date: 2022-05-01 Frequency: Weekly Progress: 30 Modality: individual3. No longer avoids persons, places, activities, and objects that are  reminiscent of the traumatic event. Objective 3.  Participate in Eye Movement Desensitization and Reprocessing (EMDR) to reduce emotional distress  related to traumatic thoughts, feelings, and images. Target Date: 2022-05-01 Frequency: Weekly Progress: 30 Modality: individual 4.  Learn and implement guided self-dialogue to manage thoughts, feelings, and urges brought on by  encounters with trauma-related situations. Target Date: 2022-05-01 Frequency: Weekly Progress: 40 Modality: individual 5.  No longer experiences intrusive event recollections, avoidance of event  reminders, intense arousal, or disinterest in activities or  relationships. Target Date:05/01/2022  Frequency : Weekly Progress 30    Modality: individual 6.Thinks about or openly discusses the traumatic event with others  without experiencing psychological or physiological distress. Target Date : 05/01/2022 Frequency: Weekly Progress:30   Modality: Individual Interventions by Therapist:  CBT, problem solving therapy, EMDR, insight oriented.  Patient approved Treatment plan.  Tayana Shankle G Orli Degrave, LCSW                                     Miamor Ayler G Gilverto Dileonardo, LCSW               Deklyn Gibbon G Haden Suder, LCSW               Adalia Pettis G Annabella Elford, LCSW               Nikiyah Fackler G Donyae Kohn, LCSW               Brailyn Delman G Raye Wiens, LCSW               Terrianne Cavness [employer], LCSW               Satish Hammers [employer], LCSW               Moriah Loughry [employer], LCSW               Vernee Baines [employer], LCSW               Teniyah Seivert [employer], LCSW               Jamica Woodyard G Elani Delph, LCSW               Phong Isenberg G Kyarah Enamorado, LCSW               Shonya Sumida G Alanson Hausmann, LCSW               Darvis Croft G Lauramae Kneisley, LCSW               Trindon Dorton G Sabian Kuba, LCSW               Rollan Roger G Waymon Laser, LCSW               Breylen Agyeman G Coreon Simkins, LCSW               Kessie Croston G Taite Baldassari, LCSW               Clarissa Laird G Crystle Carelli, LCSW               Naithen Rivenburg G Monserath Neff, LCSW               Eleuterio Dollar G Everest Brod, LCSW  Marykathryn Carboni G Susa Bones, LCSW               Shazia Mitchener G Kimberlie Csaszar, LCSW

## 2021-10-18 NOTE — Progress Notes (Unsigned)
Subjective:    Patient ID: Dawn Simmons, female    DOB: 12-31-1945, 76 y.o.   MRN: [id number]  No chief complaint on file.   HPI Patient is in today for a follow up.  Past Medical History:  Diagnosis Date   H/O fibromyalgia    H/O insomnia    H/O measles    H/O rubella    H/O syncope    History of chicken pox 09/15/2019   History of palpitations    Hx of mumps    Mitral valve prolapse    Palpitations    Syncope    Von Willebrand disease ([facility])     Past Surgical History:  Procedure Laterality Date   NO PAST SURGERIES      Family History  Problem Relation Age of Onset   Stroke Mother    Emphysema Father        smoked   Alcohol abuse Father    Heart attack Maternal Grandfather    Heart attack Paternal Grandfather    Heart attack Maternal Uncle    Heart failure Maternal Grandmother    Heart failure Paternal Grandmother    OCD Son    Drug abuse Son    Seizures Neg Hx     Social History   Socioeconomic History   Marital status: Married    Spouse name: tony   Number of children: 4   Years of education: 12   Highest education level: Not on file  Occupational History   Not on file  Tobacco Use   Smoking status: Former    Packs/day: 0.50    Years: 10.00    Total pack years: 5.00    Types: Cigarettes    Quit date: 04/27/1972    Years since quitting: 49.5   Smokeless tobacco: Never  Vaping Use   Vaping Use: Never used  Substance and Sexual Activity   Alcohol use: Yes    Alcohol/week: 0.0 standard drinks of alcohol    Comment: occ 1 glass wine or beer   Drug use: No   Sexual activity: Not on file  Other Topics Concern   Not on file  Social History Narrative   Lives at home with husband   Caffeine use- tea, 1 cup/day   Social Determinants of Health   Financial Resource Strain: Low Risk  (12/13/2019)   Overall Financial Resource Strain (CARDIA)    Difficulty of Paying Living Expenses: Not hard at all  Food Insecurity: No Food Insecurity (12/13/2019)    Hunger Vital Sign    Worried About Running Out of Food in the Last Year: Never true    Ran Out of Food in the Last Year: Never true  Transportation Needs: No Transportation Needs (02/08/2021)   PRAPARE - Administrator, Civil Service (Medical): No    Lack of Transportation (Non-Medical): No  Physical Activity: Inactive (02/08/2021)   Exercise Vital Sign    Days of Exercise per Week: 0 days    Minutes of Exercise per Session: 0 min  Stress: Not on file  Social Connections: Not on file  Intimate Partner Violence: Not At Risk (02/08/2021)   Humiliation, Afraid, Rape, and Kick questionnaire    Fear of Current or Ex-Partner: No    Emotionally Abused: No    Physically Abused: No    Sexually Abused: No    Outpatient Medications Prior to Visit  Medication Sig Dispense Refill   Ascorbic Acid (VITAMIN C) 100 MG tablet Take 100 mg by mouth  daily.     Calcium-Magnesium 200-100 MG TABS Take 4 tablets by mouth daily. 2 tablets in the morning and 2 tablets in the evening     diazepam (VALIUM) 5 MG tablet Take 1 tablet (5 mg total) by mouth at bedtime. Take 1/2-1 tablet po QHS 90 tablet 0   ergocalciferol (DRISDOL) 200 MCG/ML drops Take by mouth daily. PURE brand     FLUoxetine (PROZAC) 10 MG capsule Take 2 capsules (20 mg total) by mouth every morning. 180 capsule 0   loratadine (CLARITIN) 10 MG tablet Take 10 mg by mouth daily.     MAGNESIUM PO Take 200 mg by mouth at bedtime.     meloxicam (MOBIC) 15 MG tablet Take 1 tablet (15 mg total) by mouth daily. (Patient not taking: Reported on 09/14/2021) 30 tablet 0   Multiple Vitamin (MULTIVITAMIN) capsule Take 1 capsule by mouth daily.     nystatin (MYCOSTATIN) 100000 UNIT/ML suspension Apply to corners of mouth three x daily 60 mL 1   Omega-3 1000 MG CAPS Take by mouth.     tiZANidine (ZANAFLEX) 4 MG tablet Take 1 tablet (4 mg total) by mouth every 6 (six) hours as needed for muscle spasms. (Patient not taking: Reported on 09/14/2021) 30  tablet 0   triamcinolone cream (KENALOG) 0.1 % Apply 1 application topically 2 (two) times daily. 80 g 1   TURMERIC PO Take by mouth.     No facility-administered medications prior to visit.    Allergies  Allergen Reactions   Doxycycline Swelling   Tetanus Toxoids Other (See Comments)    PAIN IN NECK AND FACE   Flexeril [Cyclobenzaprine] Other (See Comments)    ROS     Objective:    Physical Exam  There were no vitals taken for this visit. Wt Readings from Last 3 Encounters:  09/10/21 122 lb 9.6 oz (55.6 kg)  09/07/21 124 lb 3.2 oz (56.3 kg)  08/19/21 124 lb 9.6 oz (56.5 kg)    Diabetic Foot Exam - Simple   No data filed    Lab Results  Component Value Date   WBC 4.0 01/25/2021   HGB 12.9 01/25/2021   HCT 38.1 01/25/2021   PLT 272.0 01/25/2021   GLUCOSE 88 01/25/2021   ALT 15 01/25/2021   AST 23 01/25/2021   NA 140 01/25/2021   K 4.0 01/25/2021   CL 106 01/25/2021   CREATININE 0.88 01/25/2021   BUN 17 01/25/2021   CO2 25 01/25/2021   TSH 1.83 01/25/2021    Lab Results  Component Value Date   TSH 1.83 01/25/2021   Lab Results  Component Value Date   WBC 4.0 01/25/2021   HGB 12.9 01/25/2021   HCT 38.1 01/25/2021   MCV 93.4 01/25/2021   PLT 272.0 01/25/2021   Lab Results  Component Value Date   NA 140 01/25/2021   K 4.0 01/25/2021   CO2 25 01/25/2021   GLUCOSE 88 01/25/2021   BUN 17 01/25/2021   CREATININE 0.88 01/25/2021   BILITOT 0.6 01/25/2021   ALKPHOS 67 01/25/2021   AST 23 01/25/2021   ALT 15 01/25/2021   PROT 6.9 01/25/2021   ALBUMIN 4.6 01/25/2021   CALCIUM 10.0 01/25/2021   GFR 64.86 01/25/2021   No results found for: "CHOL" No results found for: "HDL" No results found for: "LDLCALC" No results found for: "TRIG" No results found for: "CHOLHDL" No results found for: "HGBA1C"     Assessment & Plan:     Problem  List Items Addressed This Visit   None   I am having Dawn Simmons maintain her multivitamin,  Calcium-Magnesium, vitamin C, ergocalciferol, MAGNESIUM PO, Omega-3, TURMERIC PO, loratadine, nystatin, triamcinolone cream, meloxicam, tiZANidine, FLUoxetine, and diazepam.  No orders of the defined types were placed in this encounter.

## 2021-10-19 ENCOUNTER — Encounter: Payer: Self-pay | Admitting: Family Medicine

## 2021-10-19 ENCOUNTER — Ambulatory Visit (INDEPENDENT_AMBULATORY_CARE_PROVIDER_SITE_OTHER): Payer: Medicare Other | Admitting: Family Medicine

## 2021-10-19 VITALS — BP 116/70 | HR 93 | Resp 20 | Ht 66.0 in | Wt 122.8 lb

## 2021-10-19 DIAGNOSIS — R002 Palpitations: Secondary | ICD-10-CM | POA: Diagnosis not present

## 2021-10-19 DIAGNOSIS — F4310 Post-traumatic stress disorder, unspecified: Secondary | ICD-10-CM

## 2021-10-19 DIAGNOSIS — U099 Post covid-19 condition, unspecified: Secondary | ICD-10-CM

## 2021-10-19 DIAGNOSIS — R413 Other amnesia: Secondary | ICD-10-CM

## 2021-10-19 DIAGNOSIS — D72819 Decreased white blood cell count, unspecified: Secondary | ICD-10-CM

## 2021-10-19 DIAGNOSIS — M545 Low back pain, unspecified: Secondary | ICD-10-CM

## 2021-10-19 DIAGNOSIS — G4700 Insomnia, unspecified: Secondary | ICD-10-CM

## 2021-10-19 NOTE — Assessment & Plan Note (Addendum)
She has started using 3 mg dose of a nicotine patches and she believes that is helping her memory. It has not gotten worse. She plans to titrate up the patch dose at times. Is undergoing memory therapy

## 2021-10-19 NOTE — Assessment & Plan Note (Signed)
She developed pneumonia she believes it was COVID, she had been at a large wedding right before the diagnosis. She is doing

## 2021-10-19 NOTE — Assessment & Plan Note (Signed)
Has been working with Sports Medicine Dr Logan Bores and she is improving after PT

## 2021-10-19 NOTE — Assessment & Plan Note (Signed)
She is one Fluoxetine 10 mg po bid and that is helpful but they are struggling with significant stress with their son who has had addiction problems at times. Most days she finds it helpful.

## 2021-10-19 NOTE — Patient Instructions (Addendum)
Yerba Matte tea do not drink   Shingrix is the new shingles shot, 2 shots over 2-6 months, confirm coverage with insurance and document, then can return here for shots with nurse appt or at pharmacy   Sarna anti itch lotion Memory Compensation Strategies  Use "WARM" strategy.  W= write it down  A= associate it  R= repeat it  M= make a mental note  2.   You can keep a Glass blower/designer.  Use a 3-ring notebook with sections for the following: calendar, important names and phone numbers,  medications, doctors' names/phone numbers, lists/reminders, and a section to journal what you did  each day.   3.    Use a calendar to write appointments down.  4.    Write yourself a schedule for the day.  This can be placed on the calendar or in a separate section of the Memory Notebook.  Keeping a  regular schedule can help memory.  5.    Use medication organizer with sections for each day or morning/evening pills.  You may need help loading it  6.    Keep a basket, or pegboard by the door.  Place items that you need to take out with you in the basket or on the pegboard.  You may also want to  include a message board for reminders.  7.    Use sticky notes.  Place sticky notes with reminders in a place where the task is performed.  For example: " turn off the  stove" placed by the stove, "lock the door" placed on the door at eye level, " take your medications" on  the bathroom mirror or by the place where you normally take your medications.  8.    Use alarms/timers.  Use while cooking to remind yourself to check on food or as a reminder to take your medicine, or as a  reminder to make a call, or as a reminder to perform another task, etc.

## 2021-10-19 NOTE — Assessment & Plan Note (Signed)
Encouraged good sleep hygiene such as dark, quiet room. No blue/green glowing lights such as computer screens in bedroom. No alcohol or stimulants in evening. Cut down on caffeine as able. Regular exercise is helpful but not just prior to bed time. Uses Diazepam at bedtime and that is helpful

## 2021-10-20 ENCOUNTER — Ambulatory Visit: Payer: Medicare Other | Admitting: Speech Pathology

## 2021-10-20 ENCOUNTER — Encounter: Payer: Self-pay | Admitting: Speech Pathology

## 2021-10-20 DIAGNOSIS — R41841 Cognitive communication deficit: Secondary | ICD-10-CM

## 2021-10-20 DIAGNOSIS — M6281 Muscle weakness (generalized): Secondary | ICD-10-CM | POA: Diagnosis not present

## 2021-10-20 DIAGNOSIS — M5441 Lumbago with sciatica, right side: Secondary | ICD-10-CM | POA: Diagnosis not present

## 2021-10-20 NOTE — Therapy (Signed)
OUTPATIENT SPEECH LANGUAGE PATHOLOGY TREATMENT NOTE   Patient Name: Dawn Simmons MRN: [id number] DOB:Apr 21, 1945, 76 y.o., female Today's Date: 10/20/2021  PCP: Mosie Lukes, MD REFERRING PROVIDER: Gregor Hams, MD  END OF SESSION:   End of Session - 10/20/21 1457     Visit Number 7    Number of Visits 17    Date for SLP Re-Evaluation 11/15/21    SLP Start Time 1455    SLP Stop Time  7591    SLP Time Calculation (min) 35 min    Activity Tolerance Patient tolerated treatment well               Past Medical History:  Diagnosis Date   H/O fibromyalgia    H/O insomnia    H/O measles    H/O rubella    H/O syncope    History of chicken pox 09/15/2019   History of palpitations    Hx of mumps    Mitral valve prolapse    Palpitations    Syncope    Von Willebrand disease ([place])    Past Surgical History:  Procedure Laterality Date   NO PAST SURGERIES     Patient Active Problem List   Diagnosis Date Noted   Memory changes 10/19/2021   Low back pain 09/08/2021   Allergies 08/04/2020   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   Right sided weakness 01/10/2020   Left shoulder pain 10/31/2019   Leukopenia 09/15/2019   PTSD (post-traumatic stress disorder) 09/15/2019   History of chicken pox 09/15/2019   Von Willebrand disease ([place])    Upper airway cough syndrome 09/06/2018   Fibromyalgia 06/01/2015   Insomnia 06/01/2015   H/O syncope    Palpitations    Mitral valve prolapse     ONSET DATE:  "A few years", since I got Covid.    REFERRING DIAG:  R41.0 (ICD-10-CM) - Subacute confusional state   THERAPY DIAG: Cognitive communication deficit  Rationale for Evaluation and Treatment Rehabilitation  SUBJECTIVE: "I'm not sure I can remember these. (WRAP); 25 seconds later: "I got them!" 6 seconds later: "I lost them again."  PAIN:  Are you having pain? No  Pt attended therapy by herself today.   OBJECTIVE:   TODAY'S TREATMENT:  10/20/21: Pt was able to recall  4/4 of WRAP strategies given extra time. Pt reports she has not used the strategies functionally, other than writing WRAP strategies repetitively. Generated a list of items she could benefit from using W and R for. Write it re: calendar - crossing off days to keep track, writing down new information to help recall. Repeat it re: repeat things you need to remember ex: pick the flowers, pick the flowers, pick the flowers. Also practiced setting alarm to better remember taking vitamins at 2pm every day. Will ask [name] to help set up on personal phone. Cont with current POC.   10/13/21: Pt began session telling her discomfort with a small hole in her pants. This distracted her numerous times during the session today.  [name] was notably anxious after S statement. SLP assured pt that there was no judgment for pt if she did not recall memory strategies right away. SLP suggested she write down "W" "R" "A" "P" and she recalled the 4 strategies right away. SLP attempted to perform a PROM with pt and she req'd mod A consistently due to off-topic conversation. [name] scored 22/32 (higher score indicates better QOL), with most difficulty learning new tasks, trouble concentrating, paying attention in order  to be accurate, slower thinking, and reading something several times to understand it.  To target sustained attention, SLP provided pt with a word search. She demonstrated heavy audible sigh after 90 seconds saying "I'm stuck. I looked down to see if it was a word and now I'm stuck," and sighed again. SLP provided pt with hot pink post it pad and pt moved through two lines and breathed another heavy sigh saying "I'm lost. I dont' know how I'm lost. I truly don't know what to do." SLP cued pt to move back up two-three lines and cont the search. Occasionally pt would find an animal (e.g., gnu, cow) and checked to see if it was a word to cross off - SLP reminded pt this puzzle indicated [place] names. This explanation did not change  pt's behavior x3. SLP explained to pt husband following session that anxiety is a hindering factor for pt.  10/11/21: SLP started with review of "WRAP" memory strategies which pt recalled with extra time. As session progressed pt became more and more distracted today due to a reported argument last night with [name]. Very perseverative on this, needing multiple redirections during session eventually needing continual redirection. SLP could not administer cognitive PROM due to pt inability to grade/rate the stimulus areas without tangential topics.   10/04/21: Pt did not reportedly recall anything except "red ruby ring, a box, and a pocket" from previous sessions. SLP educated pt and husband on compensations for memory. With multiple repeats (x4) in 15 minutes pt was able to recall 1/4, incr'd to 2/4 with extra time and incr'd to 3/4 with min A. Pt, notably, recalled details about her rose bush, and current events regarding her son and grandson without difficulty. SLP strongly suspects psychological basis to pt's deficits, which pt acknowledged during session today as well. Pt with much tangential conversation. SLP educated pt and husband about how to maximize memory compensations at home -mostly using writing which appeared to be, from pt and husband report, pt's premorbid means to improve recall. They have calendar at home now which pt reportedly writes all appointments/has all appointments written. Pt homework to write errand stops and/or lists for items to purchase whenever they go anywhere.  09/29/21: Skilled ST focused on targeting cognition-communication this session. Pt was unable to recall any information spoken about in previous session and reports she did not really recognize therapist (though therapist has seen her 3x). She did, however, perseverate on "ruby red ring" when she entered the room and seemed to be anxious regarding her memory from the story. SLP reviewed strategies from previous session due  to pt limited memory. SLP thoroughly explained each strategy and provided examples for each. Pt had difficulty comprehending verbally-provided information, appearing visibly confused and looking at husband. SLP then had pt repeat back for confirmation what was heard. Pt was only able to provide 1 detail on average of what SLP had said. Pt would benefit from repeating back what was heard to ensure comprehension of information. Cont with attention strategies next session.    09/22/21: Pt completed Neuro QOL - Cognitive Function this session (107/140). Pt had several questions regarding how to answer the questions correctly and required further explanation for increased comprehension of written information. SLP and pt discussed pt's impairment in topic maintenance. Pt will begin answering the proposed question, then veer into a story and lose train of thought. Began discussion of attention strategies and how our cognition can be impacted by internal feelings (anxiety, depression etc.). Pt  required further explanation of each separate type of attention, as she had difficulty providing examples to demonstrate understanding of each type. Will continue to discuss next session.    PATIENT EDUCATION: Education details: see above in "today's treatment" Person educated: Patient and Spouse Education method: Explanation and Demonstration Education comprehension: verbalized understanding and needs further education         GOALS: Goals reviewed with patient? Yes   SHORT TERM GOALS: Target date: 10/13/2021     Pt will verbalize 3 memory strategies to assist with recall of important information across 3 sessions. Baseline: 10-06-21, 10-11-21 Goal status: Met 10-13-21   2.  Pt will verbalize 3 attention strategies to increase focus and comprehension during conversations across 3 sessions.  Baseline:  Goal status: Ongoing   3.  Pt will complete cognitive PROM Baseline:  Goal status: Met 10-13-21     LONG TERM  GOALS: Target date: 11/10/2021     Pt will demonstrate and/or report successful use of memory strategies to recall important information. Baseline:  Goal status: Ongoing   2.  Pt will demonstrate and/or report successful use of 3 attention strategies to increase focus comprehension during conversations. Baseline:  Goal status: Ongoing       ASSESSMENT:   CLINICAL IMPRESSION: Pt is a 76 yo female who presents to [facility] for tx, with diagnosis of COVID long-hauler. Today pt again distracted with multiple items listen in tx note. SLP cont to rec skilled ST services to address cognitive-communication impairment to increase confidence and maximize functional communication. Suspect basis of the majority of pt's sx are psychological - if pt's anxiety does not decr, SLP questions efficacy of ST at this time and d/c might be more appropriate until pt's anxiety can be better managed.     OBJECTIVE IMPAIRMENTS include attention and memory. These impairments are limiting patient from managing medications, managing appointments, managing finances, and effectively communicating at home and in community. Factors affecting potential to achieve goals and functional outcome are  levels of anxiety .Marland Kitchen Patient will benefit from skilled SLP services to address above impairments and improve overall function.   REHAB POTENTIAL: Good   PLAN: SLP FREQUENCY: 2x/week   SLP DURATION: 8 weeks   PLANNED INTERVENTIONS: Environmental controls, Cueing hierachy, Internal/external aids, Functional tasks, SLP instruction and feedback, Compensatory strategies, and Patient/family education     [place], CCC-SLP 10/20/2021, 3:00 PM

## 2021-10-21 ENCOUNTER — Ambulatory Visit (INDEPENDENT_AMBULATORY_CARE_PROVIDER_SITE_OTHER): Payer: Medicare Other | Admitting: Psychology

## 2021-10-21 DIAGNOSIS — F4310 Post-traumatic stress disorder, unspecified: Secondary | ICD-10-CM

## 2021-10-21 NOTE — Progress Notes (Signed)
[facility] Counselor/Therapist Progress Note  Patient ID: Dawn Simmons, MRN: [id number],    Date: 10/20/2021  Time Spent: 60 minutes  Treatment Type: Individual Therapy  Reported Symptoms: anxiety and depression and memory loss  Mental Status Exam: Appearance:  Casual     Behavior: Appropriate  Motor: Normal  Speech/Language:  Normal Rate  Affect: Blunt  Mood: pleasant  Thought process: normal  Thought content:   WNL  Sensory/Perceptual disturbances:   WNL  Orientation: oriented to person, place, time/date, and situation  Attention: Good  Concentration: Good  Memory: WNL  Fund of knowledge:  Good  Insight:   Good  Judgment:  Good  Impulse Control: Good   Risk Assessment: Danger to Self:  No Self-injurious Behavior: No Danger to Others: No Duty to Warn:no Physical Aggression / Violence:No  Access to Firearms a concern: No  Gang Involvement:No   Subjective: The patient attended a face-to-face individual therapy session in the office today.  The patient presents with a blunted affect and mood is pleasant.  The patient states that she is anxious for the situation to be resolved with her grandson.  She says that her son is planning to go to court and that her grandson's grandfather on her mother's side is planning to fight the situation so that he can keep the grandson.  We talked about the likelihood of that happening since her son is the actual biological father and is doing well.  I explained that there may be some home visits that need to be made so that it may take a while to get the situation resolved. I encouraged the patient to continue to do things around the house and then her gardening to help her stay focused and balanced.   Interventions: Cognitive Behavioral Therapy and Eye Movement Desensitization and Reprocessing (EMDR)  Diagnosis:PTSD (post-traumatic stress disorder)  Plan: Treatment Plan  Strengths/Abilities:Insightful, motivated,  supportive husband  Treatment Preferences: Outpatient Individual therapy  Statement of Needs: "I have a problem passing out and I was told I have PTSD"    Symptoms:  Demonstrates an exaggerated startle response.:(Status: improved). Depressed  or irritable mood.: (Status: improved). Describes a reliving of the event,  particularly through dissociative flashbacks.:  (Status: improved). Displays a  significant decline in interest and engagement in activities.: (Status: improved). Displays significant psychological and/or physiological distress resulting from internal and external  clues that are reminiscent of the traumatic event.: (Status: improved).  Experiences disturbances in sleep.: (Status: improved). Experiences disturbing  and persistent thoughts, images, and/or perceptions of the traumatic event.: (Status: improved). Experiences frequent nightmares.: (Status: improved).  Feelings of hopelessness, worthlessness, or inappropriate guilt.: (Status:  improved). Has been exposed to a traumatic event involving actual or perceived threat of death or  serious injury.: (Status: maintained). Impairment in social, occupational, or  other areas of functioning.: (Status: improved). Intentionally avoids activities,  places, people, or objects (e.g., up-armored vehicles) that evoke memories of the event.:(Status: maintained). Intentionally avoids thoughts, feelings, or discussions related  to the traumatic event.: (Status: improved). Reports difficulty concentrating as  well as feelings of guilt.:  (Status: improved). Reports response of intense fear,  helplessness, or horror to the traumatic event.:  (Status: improved).  Problems Addressed: PTSD   Goals:  LTG:  1. Develop healthy thinking patterns and beliefs about self, others, and the world that lead to the alleviation and help prevent the relapse of  depression.  50% Objective Identify and replace thoughts and beliefs that support  depression.  50 %  Target Date: 2022-05-01 Frequency: Weekly Progress: 50 Modality: individual 2. Eliminate or reduce the negative impact trauma related symptoms have  on social, occupational, and family functioning.  50% Objective Learn and implement personal skills to manage challenging situations related to trauma. Target Date: 2022-05-01 Frequency: Weekly Progress: 30 Modality: individual3. No longer avoids persons, places, activities, and objects that are  reminiscent of the traumatic event. Objective 3.  Participate in Eye Movement Desensitization and Reprocessing (EMDR) to reduce emotional distress  related to traumatic thoughts, feelings, and images. Target Date: 2022-05-01 Frequency: Weekly Progress: 30 Modality: individual 4.  Learn and implement guided self-dialogue to manage thoughts, feelings, and urges brought on by  encounters with trauma-related situations. Target Date: 2022-05-01 Frequency: Weekly Progress: 40 Modality: individual 5.  No longer experiences intrusive event recollections, avoidance of event  reminders, intense arousal, or disinterest in activities or  relationships. Target Date:05/01/2022  Frequency : Weekly Progress 30    Modality: individual 6.Thinks about or openly discusses the traumatic event with others  without experiencing psychological or physiological distress. Target Date : 05/01/2022 Frequency: Weekly Progress:30   Modality: Individual Interventions by Therapist:  CBT, problem solving therapy, EMDR, insight oriented.  Patient approved Treatment plan.  Jovante Hammitt G Latiya Navia, LCSW                                     Faylynn Stamos G Wyatte Dames, LCSW               Ladena Jacquez G Aeron Lheureux, LCSW               Vesper Trant G Attie Nawabi, LCSW               Kaire Stary G Katleen Carraway, LCSW               Aevah Stansbery G Chrstopher Malenfant, LCSW               Tangee Marszalek [employer], LCSW               Morgyn Marut [employer], LCSW               Olyver Hawes [employer], LCSW               Naeemah Jasmer [employer], LCSW               Tamikka Pilger [employer], LCSW               Dio Giller G Cade Olberding, LCSW               Loella Hickle G Goodwin Kamphaus, LCSW               Jeaneen Cala G Marvette Schamp, LCSW               Autry Droege G Kristien Salatino, LCSW               Daxten Kovalenko G Wetona Viramontes, LCSW               Marlana Mckowen G Seema Blum, LCSW               Alpheus Stiff G Marlane Hirschmann, LCSW               [name] Granquist G Ronan Dion, LCSW               Ladarious Kresse G Chauntae Hults, LCSW               Sherissa Tenenbaum G Vearl Aitken, LCSW  Maryna Yeagle G Philipe Laswell, LCSW               Adamariz Gillott G Lynsee Wands, LCSW               Tonni Mansour G Anetta Olvera, LCSW               Tabby Beaston G Zhanae Proffit, LCSW

## 2021-10-22 ENCOUNTER — Ambulatory Visit (INDEPENDENT_AMBULATORY_CARE_PROVIDER_SITE_OTHER): Payer: Medicare Other | Admitting: Family Medicine

## 2021-10-22 VITALS — BP 96/64 | HR 77 | Ht 66.0 in | Wt 122.4 lb

## 2021-10-22 DIAGNOSIS — M5441 Lumbago with sciatica, right side: Secondary | ICD-10-CM

## 2021-10-22 DIAGNOSIS — R410 Disorientation, unspecified: Secondary | ICD-10-CM | POA: Diagnosis not present

## 2021-10-22 NOTE — Progress Notes (Signed)
I, Philbert Riser, LAT, ATC acting as a scribe for [name], MD.  Dawn Simmons is a 76 y.o. female who presents to [facility] at [facility] today for f/u R-sided LBP. Pt was last seen by Dr. Denyse Amass on 09/10/21 and was advised to use a heating pad and was referred to PT, completing 9 visits. Today, pt reports low back is feeling 85% better. Pt has been d/c from conventional PT, but is working on her HEP daily.  She notes her cognitive impairment following COVID remains problematic despite receiving and continuing cognitive therapy.  She is struggling with everyday tasks.   Dx imaging: 09/10/21 L-spine XR 01/10/20 R hip XR             10/04/09 L-spine MRI  Pertinent review of systems: No fevers or chills  Relevant historical information: Von Willebrand's disease.   Exam:  BP 96/64   Pulse 77   Ht 5\' 6"  (1.676 m)   Wt 122 lb 6.4 oz (55.5 kg)   SpO2 98%   BMI 19.76 kg/m  General: Well Developed, well nourished, and in no acute distress.   MSK: L-spine: Normal lumbar motion. Neuropsych: Alert and oriented.  Patient lost her train of thought several times during the conversation and required some redirection as her thought processes tangential.    Lab and Radiology Results  EXAM: LUMBAR SPINE - 2-3 VIEW   COMPARISON:  Radiograph 04/28/2011   FINDINGS: There are 5 non-rib-bearing lumbar vertebra. Subjective bony under mineralization. Mild broad-based dextroscoliotic curvature. Trace retrolisthesis of L4 on L5. vertebral body heights are normal. Minor L4-L5 disc space narrowing. Minor L5-S1 facet hypertrophy. No evidence of fracture, focal bone lesion or bone destruction. Sacroiliac joints are congruent.   IMPRESSION: 1. Mild broad-based dextroscoliotic curvature. 2. Mild L4-L5 disc space narrowing. 3. Minor L5-S1 facet hypertrophy.     Electronically Signed   By: 04/30/2011 M.D.   On: 09/12/2021 16:43 I, 11/12/2021, personally (independently)  visualized and performed the interpretation of the images attached in this note.    Assessment and Plan: 76 y.o. female with back pain.  Significant improvement with physical therapy.  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  She is current receiving cognitive rehab as part of speech therapy which does seem to be helping some but is still having significant dysfunction.  I think ultimately a neuropsychological evaluation will provide a lot of clarity to her and to her medical team about the exact diagnosis for cognitive dysfunction and the degree of impairment as well as some other treatment methodologies going forward.  Plan for neuropsychological evaluation.  Recommend follow-up with me after we get the results back from neuropsychology.  This may take a few months to get done which is okay.   PDMP not reviewed this encounter. Orders Placed This Encounter  Procedures   Ambulatory referral to Neuropsychology    Referral Priority:   Routine    Referral Type:   Psychiatric    Referral Reason:   Specialty Services Required    Requested Specialty:   Psychology    Number of Visits Requested:   1   No orders of the defined types were placed in this encounter.    Discussed warning signs or symptoms. Please see discharge instructions. Patient expresses understanding.   The above documentation has been reviewed and is accurate and complete 66, M.D.  Total encounter time 30 minutes including face-to-face time with  the patient and, reviewing past medical record, and charting on the date of service.

## 2021-10-22 NOTE — Patient Instructions (Addendum)
Thank you for coming in today.   Continue your back exercises.    Plan for neuropsychological assessment.  Let me know if you have trouble getting an appointment.

## 2021-10-25 ENCOUNTER — Ambulatory Visit: Payer: Medicare Other

## 2021-10-25 DIAGNOSIS — R41841 Cognitive communication deficit: Secondary | ICD-10-CM

## 2021-10-25 DIAGNOSIS — M6281 Muscle weakness (generalized): Secondary | ICD-10-CM | POA: Diagnosis not present

## 2021-10-25 DIAGNOSIS — M5441 Lumbago with sciatica, right side: Secondary | ICD-10-CM | POA: Diagnosis not present

## 2021-10-25 NOTE — Patient Instructions (Addendum)
   Strategies for memory:  Write:  use the calendar and cross off days, and/or "writing"/making an alarm in your phone for recalling to take your vitamins, and/or writing a short list for errands, or for things to get at the grocery  Repeat: "pick the flowers, pick the flowers, pick the flowers" to assist short term memory  Associate: Using your daily routine to take your meds at the right time. (Associating taking your meds with the thing you do at that time each day)  Picture it:  Using a picture in your mind for a few items from your grocery list.

## 2021-10-25 NOTE — Therapy (Signed)
OUTPATIENT SPEECH LANGUAGE PATHOLOGY TREATMENT NOTE   Patient Name: Dawn Simmons MRN: [id number] DOB:Aug 28, 1945, 76 y.o., female Today's Date: 10/25/2021  PCP: Mosie Lukes, MD REFERRING PROVIDER: Gregor Hams, MD  END OF SESSION:   End of Session - 10/25/21 2204     Visit Number 8    Number of Visits 17    Date for SLP Re-Evaluation 11/15/21    SLP Start Time 69    SLP Stop Time  4037    SLP Time Calculation (min) 40 min    Activity Tolerance Other (comment)   limited due to anxiety, per pt               Past Medical History:  Diagnosis Date   H/O fibromyalgia    H/O insomnia    H/O measles    H/O rubella    H/O syncope    History of chicken pox 09/15/2019   History of palpitations    Hx of mumps    Mitral valve prolapse    Palpitations    Syncope    Von Willebrand disease ([place])    Past Surgical History:  Procedure Laterality Date   NO PAST SURGERIES     Patient Active Problem List   Diagnosis Date Noted   Memory changes 10/19/2021   Low back pain 09/08/2021   Allergies 08/04/2020   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   Right sided weakness 01/10/2020   Left shoulder pain 10/31/2019   Leukopenia 09/15/2019   PTSD (post-traumatic stress disorder) 09/15/2019   History of chicken pox 09/15/2019   Von Willebrand disease ([place])    Upper airway cough syndrome 09/06/2018   Fibromyalgia 06/01/2015   Insomnia 06/01/2015   H/O syncope    Palpitations    Mitral valve prolapse     ONSET DATE:  "A few years", since I got Covid.    REFERRING DIAG:  R41.0 (ICD-10-CM) - Subacute confusional state   THERAPY DIAG: Cognitive communication deficit  Rationale for Evaluation and Treatment Rehabilitation  SUBJECTIVE: "I really just think if I can see Ronin again I won't be as anxious. I saw a picture of him but that was all."  PAIN:  Are you having pain? No  Pt attended therapy by herself today.   OBJECTIVE:   TODAY'S TREATMENT:  10/25/21: Dawn Simmons  commented that she was encouraged by SLP-Golden to cross of the days on her calendar since the previous session, but has not done so recently. When SLP asked Dawn Simmons about using an alarm to take her meds she said she had not done this yet, however does recall to take her meds without difficulty. Pt recalled 4/4 memory strategies (Write, Repeat, Associate, Picture it) but did not recall practical examples even though she told SLP she was encouraged to cross off days on her calendar as they occurred. SLP reviewed information provided from last week with SLP- Armandina Gemma (practical ways to use "write" and "repeat", and went over practical ways to use "associate" - e.g., pt using her daily routine to recall to take her meds at the same time each day, and "picture it" - use a picture in the mind to recall 3-4 grocery items. With each strategy, pt had tangential conversation requiring redirection. Dawn Simmons recalled details from Dr. Clovis Riley visit last week about referral for neuropsych eval, and that Dr. Georgina Snell said to call as soon as possible - which pt did when she returned home, and has appt for October. She told SLP she asked receptionist to  put her on cancellation list.  Pt perseverative today about a judicial hearing for her grandson and not being able to see him. Dawn Simmons said that she was anxious about these things, and SLP reiterated that anxiety can definitely complicate ability to attend to daily events and thus remember them. SLP believes pt is appropriate to decr to once/week, as she is learning increasingly how to use strategies. Level of anxiety is hindering progress.  10/20/21: Pt was able to recall 4/4 of WRAP strategies given extra time. Pt reports she has not used the strategies functionally, other than writing WRAP strategies repetitively. Generated a list of items she could benefit from using W and R for. Write it re: calendar - crossing off days to keep track, writing down new information to help recall. Repeat  it re: repeat things you need to remember ex: pick the flowers, pick the flowers, pick the flowers. Also practiced setting alarm to better remember taking vitamins at 2pm every day. Will ask [name] to help set up on personal phone. Cont with current POC.   10/13/21: Pt began session telling her discomfort with a small hole in her pants. This distracted her numerous times during the session today.  Dawn Simmons was notably anxious after S statement. SLP assured pt that there was no judgment for pt if she did not recall memory strategies right away. SLP suggested she write down "W" "R" "A" "P" and she recalled the 4 strategies right away. SLP attempted to perform a PROM with pt and she req'd mod A consistently due to off-topic conversation. [name] scored 22/32 (higher score indicates better QOL), with most difficulty learning new tasks, trouble concentrating, paying attention in order to be accurate, slower thinking, and reading something several times to understand it.  To target sustained attention, SLP provided pt with a word search. She demonstrated heavy audible sigh after 90 seconds saying "I'm stuck. I looked down to see if it was a word and now I'm stuck," and sighed again. SLP provided pt with hot pink post it pad and pt moved through two lines and breathed another heavy sigh saying "I'm lost. I dont' know how I'm lost. I truly don't know what to do." SLP cued pt to move back up two-three lines and cont the search. Occasionally pt would find an animal (e.g., gnu, cow) and checked to see if it was a word to cross off - SLP reminded pt this puzzle indicated [place] names. This explanation did not change pt's behavior x3. SLP explained to pt husband following session that anxiety is a hindering factor for pt.  10/11/21: SLP started with review of "WRAP" memory strategies which pt recalled with extra time. As session progressed pt became more and more distracted today due to a reported argument last night with [name]. Very  perseverative on this, needing multiple redirections during session eventually needing continual redirection. SLP could not administer cognitive PROM due to pt inability to grade/rate the stimulus areas without tangential topics.   10/04/21: Pt did not reportedly recall anything except "red ruby ring, a box, and a pocket" from previous sessions. SLP educated pt and husband on compensations for memory. With multiple repeats (x4) in 15 minutes pt was able to recall 1/4, incr'd to 2/4 with extra time and incr'd to 3/4 with min A. Pt, notably, recalled details about her rose bush, and current events regarding her son and grandson without difficulty. SLP strongly suspects psychological basis to pt's deficits, which pt acknowledged during session today as  well. Pt with much tangential conversation. SLP educated pt and husband about how to maximize memory compensations at home -mostly using writing which appeared to be, from pt and husband report, pt's premorbid means to improve recall. They have calendar at home now which pt reportedly writes all appointments/has all appointments written. Pt homework to write errand stops and/or lists for items to purchase whenever they go anywhere.  09/29/21: Skilled ST focused on targeting cognition-communication this session. Pt was unable to recall any information spoken about in previous session and reports she did not really recognize therapist (though therapist has seen her 3x). She did, however, perseverate on "ruby red ring" when she entered the room and seemed to be anxious regarding her memory from the story. SLP reviewed strategies from previous session due to pt limited memory. SLP thoroughly explained each strategy and provided examples for each. Pt had difficulty comprehending verbally-provided information, appearing visibly confused and looking at husband. SLP then had pt repeat back for confirmation what was heard. Pt was only able to provide 1 detail on average of what  SLP had said. Pt would benefit from repeating back what was heard to ensure comprehension of information. Cont with attention strategies next session.     PATIENT EDUCATION: Education details: see above in "today's treatment" Person educated: Patient and Spouse Education method: Explanation and Demonstration Education comprehension: verbalized understanding and needs further education       GOALS: Goals reviewed with patient? Yes   SHORT TERM GOALS: Target date: 10/13/2021     Pt will verbalize 3 memory strategies to assist with recall of important information across 3 sessions. Baseline: 10-06-21, 10-11-21 Goal status: Met 10-13-21   2.  Pt will verbalize 3 attention strategies to increase focus and comprehension during conversations across 3 sessions.  Baseline:  Goal status: Not met   3.  Pt will complete cognitive PROM Baseline:  Goal status: Met 10-13-21     LONG TERM GOALS: Target date: 11/10/2021     Pt will demonstrate and/or report successful use of memory strategies to recall important information. Baseline:  Goal status: Ongoing   2.  Pt will demonstrate and/or report successful use of 3 attention strategies to increase focus comprehension during conversations. Baseline:  Goal status: Ongoing       ASSESSMENT:   CLINICAL IMPRESSION: Pt is a 76 yo female who presents to [facility] for tx, with diagnosis of COVID long-hauler. Dawn Simmons continues very distracted with family matters listed in tx note. SLP cont to rec skilled ST services to address cognitive-communication impairment to increase confidence and maximize functional communication. Pt demonstrated good memory with multiple things today described in note for today. Suspect basis of the majority of pt's sx are psychological - if pt's anxiety does not decr, SLP questions efficacy of ST at this time and today decr'd frequency to once/week. Plan to d/c in 1-3 visits if pt's anxiety does not decr.      OBJECTIVE IMPAIRMENTS  include attention and memory. These impairments are limiting patient from managing medications, managing appointments, managing finances, and effectively communicating at home and in community. Factors affecting potential to achieve goals and functional outcome are  levels of anxiety .Marland Kitchen Patient will benefit from skilled SLP services to address above impairments and improve overall function.   REHAB POTENTIAL: Good   PLAN: SLP FREQUENCY: 2x/week   SLP DURATION: 8 weeks   PLANNED INTERVENTIONS: Environmental controls, Cueing hierachy, Internal/external aids, Functional tasks, SLP instruction and feedback, Compensatory strategies, and Patient/family  education     [facility] [place] 10/25/2021, 10:05 PM

## 2021-10-28 ENCOUNTER — Ambulatory Visit (INDEPENDENT_AMBULATORY_CARE_PROVIDER_SITE_OTHER): Payer: Medicare Other | Admitting: Psychology

## 2021-10-28 DIAGNOSIS — F4310 Post-traumatic stress disorder, unspecified: Secondary | ICD-10-CM

## 2021-10-28 NOTE — Progress Notes (Signed)
[facility] Counselor/Therapist Progress Note  Patient ID: Dawn Simmons, MRN: [id number],    Date: 10/28/2021  Time Spent: 60 minutes  Treatment Type: Individual Therapy  Reported Symptoms: anxiety and depression and memory loss  Mental Status Exam: Appearance:  Casual     Behavior: Appropriate  Motor: Normal  Speech/Language:  Normal Rate  Affect: Blunt  Mood: pleasant  Thought process: normal  Thought content:   WNL  Sensory/Perceptual disturbances:   WNL  Orientation: oriented to person, place, time/date, and situation  Attention: Good  Concentration: Good  Memory: WNL  Fund of knowledge:  Good  Insight:   Good  Judgment:  Good  Impulse Control: Good   Risk Assessment: Danger to Self:  No Self-injurious Behavior: No Danger to Others: No Duty to Warn:no Physical Aggression / Violence:No  Access to Firearms a concern: No  Gang Involvement:No   Subjective: The patient attended a face-to-face individual therapy session in the office today.  The patient presents with a blunted affect and mood is pleasant.  The patient reports that she is very excited about today because her grandson is going to be at her son's house for his birthday this evening.  The patient states that her son and the patient's grandson's grandfather got together for dinner and they worked out a compromise on how to deal with visitation and her son's rights as a parent.  The patient has been waiting for this and feels very good about it.  We talked about how she is coping with it and she is trying to avoid really dealing with any negative feelings about the situation at all.  The patient kept trying to say that her grandson was not going to be very sad about the loss of his mother and I explained to her that we cannot it nor that he is going to be sad and that is okay for that to happen.  Historically the patient has avoided dealing with feelings and thinking she is coping with them when she is  just putting them aside.  I tried to help her understand that dynamic by pointing out how she was dealing with her grandson.   Interventions: Cognitive Behavioral Therapy and Eye Movement Desensitization and Reprocessing (EMDR)  Diagnosis:PTSD (post-traumatic stress disorder)  Plan: Treatment Plan  Strengths/Abilities:Insightful, motivated, supportive husband  Treatment Preferences: Outpatient Individual therapy  Statement of Needs: "I have a problem passing out and I was told I have PTSD"    Symptoms:  Demonstrates an exaggerated startle response.:(Status: improved). Depressed  or irritable mood.: (Status: improved). Describes a reliving of the event,  particularly through dissociative flashbacks.:  (Status: improved). Displays a  significant decline in interest and engagement in activities.: (Status: improved). Displays significant psychological and/or physiological distress resulting from internal and external  clues that are reminiscent of the traumatic event.: (Status: improved).  Experiences disturbances in sleep.: (Status: improved). Experiences disturbing  and persistent thoughts, images, and/or perceptions of the traumatic event.: (Status: improved). Experiences frequent nightmares.: (Status: improved).  Feelings of hopelessness, worthlessness, or inappropriate guilt.: (Status:  improved). Has been exposed to a traumatic event involving actual or perceived threat of death or  serious injury.: (Status: maintained). Impairment in social, occupational, or  other areas of functioning.: (Status: improved). Intentionally avoids activities,  places, people, or objects (e.g., up-armored vehicles) that evoke memories of the event.:(Status: maintained). Intentionally avoids thoughts, feelings, or discussions related  to the traumatic event.: (Status: improved). Reports difficulty concentrating as  well as feelings of guilt.:  (  Status: improved). Reports response of intense fear,   helplessness, or horror to the traumatic event.:  (Status: improved).  Problems Addressed: PTSD   Goals:  LTG:  1. Develop healthy thinking patterns and beliefs about self, others, and the world that lead to the alleviation and help prevent the relapse of  depression.  50% Objective Identify and replace thoughts and beliefs that support depression.  50 % Target Date: 2022-05-01 Frequency: Weekly Progress: 50 Modality: individual 2. Eliminate or reduce the negative impact trauma related symptoms have  on social, occupational, and family functioning.  50% Objective Learn and implement personal skills to manage challenging situations related to trauma. Target Date: 2022-05-01 Frequency: Weekly Progress: 30 Modality: individual3. No longer avoids persons, places, activities, and objects that are  reminiscent of the traumatic event. Objective 3.  Participate in Eye Movement Desensitization and Reprocessing (EMDR) to reduce emotional distress  related to traumatic thoughts, feelings, and images. Target Date: 2022-05-01 Frequency: Weekly Progress: 30 Modality: individual 4.  Learn and implement guided self-dialogue to manage thoughts, feelings, and urges brought on by  encounters with trauma-related situations. Target Date: 2022-05-01 Frequency: Weekly Progress: 40 Modality: individual 5.  No longer experiences intrusive event recollections, avoidance of event  reminders, intense arousal, or disinterest in activities or  relationships. Target Date:05/01/2022  Frequency : Weekly Progress 30    Modality: individual 6.Thinks about or openly discusses the traumatic event with others  without experiencing psychological or physiological distress. Target Date : 05/01/2022 Frequency: Weekly Progress:30   Modality: Individual Interventions by Therapist:  CBT, problem solving therapy, EMDR, insight oriented.  Patient approved Treatment plan.  Anmol Fleck G Jaycob Mcclenton,  LCSW                                     Davione Lenker G Juliann Olesky, LCSW               Keili Hasten G Seena Ritacco, LCSW               Alanson Hausmann G Aldene Hendon, LCSW               Viviann Broyles G Taran Haynesworth, LCSW               Brieanne Mignone G Justus Droke, LCSW               Brettany Sydney G Lavina Resor, LCSW               Anneta Rounds [employer], LCSW               Areon Cocuzza [employer], LCSW               Merilee Wible G Miryah Ralls, LCSW               Robie Oats G Emmory Solivan, LCSW               Arran Fessel G Kristina Mcnorton, LCSW               Sevilla Murtagh G Tuvia Woodrick, LCSW               Brekyn Huntoon G Merdith Adan, LCSW               Dawt Reeb G Ximena Todaro, LCSW               Dyson Sevey G Angeletta Goelz, LCSW               Daphane Odekirk G Rowan Pollman, LCSW  Alexa Golebiewski G Kenesha Moshier, LCSW               Tyller Bowlby G Terence Bart, LCSW               Delmas Faucett G Stephane Junkins, LCSW               Duron Meister G Ephrata [place], LCSW               Callin Ashe G Tywana Robotham, LCSW               Baillie Mohammad G Sylvester Salonga, LCSW               Windle Huebert G Aleea Hendry, LCSW               Apryl Brymer G Hisae Decoursey, LCSW               Donalee Gaumond G Aashritha Miedema, LCSW

## 2021-11-01 ENCOUNTER — Encounter: Payer: Self-pay | Admitting: Psychology

## 2021-11-01 ENCOUNTER — Ambulatory Visit: Payer: Medicare Other

## 2021-11-01 DIAGNOSIS — M6281 Muscle weakness (generalized): Secondary | ICD-10-CM | POA: Diagnosis not present

## 2021-11-01 DIAGNOSIS — R41841 Cognitive communication deficit: Secondary | ICD-10-CM

## 2021-11-01 DIAGNOSIS — M5441 Lumbago with sciatica, right side: Secondary | ICD-10-CM | POA: Diagnosis not present

## 2021-11-01 NOTE — Patient Instructions (Signed)
SETTING AN ALARM:  Press "clock" app picture (picture of a clock face)  Press "alarm" at the bottom of the screen   press "+" in upper right corner to add an alarm  Select the hour and minute, and PM or AM   "Repeat":  if you want the alarm to go off more than once   -Press the days you want the alarm to go off   -Press "back" in upper left corner  "Label":  write down why you are setting the alarm   - press the "       "  to delete "Alarm" written there, and type reason for the alarm    -  press "back" in upper left corner  "Sound":   choose the sound you want as the alarm told, if you want a different one than is there already -Select the sound you want as the alarm   -press "back" in upper left corner  Press "save" in the upper right corner to save the alarm to use later

## 2021-11-01 NOTE — Therapy (Unsigned)
OUTPATIENT SPEECH LANGUAGE PATHOLOGY TREATMENT NOTE   Patient Name: Dawn Simmons MRN: [id number] DOB:[birth year], 76 y.o., female Today's Date: 11/01/2021  PCP: Mosie Lukes, MD REFERRING PROVIDER: Gregor Hams, MD  END OF SESSION:   End of Session - 11/01/21 1449     Visit Number 9    Number of Visits 17    Date for SLP Re-Evaluation 11/15/21    SLP Start Time 55    SLP Stop Time  4098    SLP Time Calculation (min) 40 min                Past Medical History:  Diagnosis Date   H/O fibromyalgia    H/O insomnia    H/O measles    H/O rubella    H/O syncope    History of chicken pox 09/15/2019   History of palpitations    Hx of mumps    Mitral valve prolapse    Palpitations    Syncope    Von Willebrand disease ([place])    Past Surgical History:  Procedure Laterality Date   NO PAST SURGERIES     Patient Active Problem List   Diagnosis Date Noted   Memory changes 10/19/2021   Low back pain 09/08/2021   Allergies 08/04/2020   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   Right sided weakness 01/10/2020   Left shoulder pain 10/31/2019   Leukopenia 09/15/2019   PTSD (post-traumatic stress disorder) 09/15/2019   History of chicken pox 09/15/2019   Von Willebrand disease ([place])    Upper airway cough syndrome 09/06/2018   Fibromyalgia 06/01/2015   Insomnia 06/01/2015   H/O syncope    Palpitations    Mitral valve prolapse     ONSET DATE:  "A few years", since I got Covid.    REFERRING DIAG:  R41.0 (ICD-10-CM) - Subacute confusional state   THERAPY DIAG: Cognitive communication deficit  Rationale for Evaluation and Treatment Rehabilitation  SUBJECTIVE: "It's not going real well. I keep forgetting to do it" (Pt, re: crossing off days on her calendar)  PAIN:  Are you having pain? No  Pt attended therapy by herself today.   OBJECTIVE:   TODAY'S TREATMENT:  11/01/21: In addition to forgetting to cross off days on the calendar, [name] also reported she did not  use an alarm to take her vitamins. SLP had pt look in her phone and there was not an alarm for 2pm. SLP educated pt how to set an alarm in her iPhone and provided written instructions for her to do this. She req'd mod-max A consistently. Notably she repeated questions asked in previous 2 minutes x4 during this process. SLP told [name] a summary of our session today and asked him to attend next session to review memory strategies. Perseveration today on grandson and seeing him over the weekend - req'd redirection x3 today.   7/17/23[name] commented that she was encouraged by SLP-Golden to cross of the days on her calendar since the previous session, but has not done so recently. When SLP asked [name] about using an alarm to take her meds she said she had not done this yet, however does recall to take her meds without difficulty. Pt recalled 4/4 memory strategies (Write, Repeat, Associate, Picture it) but did not recall practical examples even though she told SLP she was encouraged to cross off days on her calendar as they occurred. SLP reviewed information provided from last week with SLP- Armandina Gemma (practical ways to use "write" and "repeat", and  went over practical ways to use "associate" - e.g., pt using her daily routine to recall to take her meds at the same time each day, and "picture it" - use a picture in the mind to recall 3-4 grocery items. With each strategy, pt had tangential conversation requiring redirection. [name] recalled details from Dr. Clovis Riley visit last week about referral for neuropsych eval, and that Dr. Georgina Snell said to call as soon as possible - which pt did when she returned home, and has appt for October. She told SLP she asked receptionist to put her on cancellation list.  Pt perseverative today about a judicial hearing for her grandson and not being able to see him. [name] said that she was anxious about these things, and SLP reiterated that anxiety can definitely complicate ability to attend  to daily events and thus remember them. SLP believes pt is appropriate to decr to once/week, as she is learning increasingly how to use strategies. Level of anxiety is hindering progress.  10/20/21: Pt was able to recall 4/4 of WRAP strategies given extra time. Pt reports she has not used the strategies functionally, other than writing WRAP strategies repetitively. Generated a list of items she could benefit from using W and R for. Write it re: calendar - crossing off days to keep track, writing down new information to help recall. Repeat it re: repeat things you need to remember ex: pick the flowers, pick the flowers, pick the flowers. Also practiced setting alarm to better remember taking vitamins at 2pm every day. Will ask [name] to help set up on personal phone. Cont with current POC.   10/13/21: Pt began session telling her discomfort with a small hole in her pants. This distracted her numerous times during the session today.  [name] was notably anxious after S statement. SLP assured pt that there was no judgment for pt if she did not recall memory strategies right away. SLP suggested she write down "W" "R" "A" "P" and she recalled the 4 strategies right away. SLP attempted to perform a PROM with pt and she req'd mod A consistently due to off-topic conversation. [name] scored 22/32 (higher score indicates better QOL), with most difficulty learning new tasks, trouble concentrating, paying attention in order to be accurate, slower thinking, and reading something several times to understand it.  To target sustained attention, SLP provided pt with a word search. She demonstrated heavy audible sigh after 90 seconds saying "I'm stuck. I looked down to see if it was a word and now I'm stuck," and sighed again. SLP provided pt with hot pink post it pad and pt moved through two lines and breathed another heavy sigh saying "I'm lost. I dont' know how I'm lost. I truly don't know what to do." SLP cued pt to move back up  two-three lines and cont the search. Occasionally pt would find an animal (e.g., gnu, cow) and checked to see if it was a word to cross off - SLP reminded pt this puzzle indicated [place] names. This explanation did not change pt's behavior x3. SLP explained to pt husband following session that anxiety is a hindering factor for pt.  10/11/21: SLP started with review of "WRAP" memory strategies which pt recalled with extra time. As session progressed pt became more and more distracted today due to a reported argument last night with [name]. Very perseverative on this, needing multiple redirections during session eventually needing continual redirection. SLP could not administer cognitive PROM due to pt inability to grade/rate  the stimulus areas without tangential topics.   10/04/21: Pt did not reportedly recall anything except "red ruby ring, a box, and a pocket" from previous sessions. SLP educated pt and husband on compensations for memory. With multiple repeats (x4) in 15 minutes pt was able to recall 1/4, incr'd to 2/4 with extra time and incr'd to 3/4 with min A. Pt, notably, recalled details about her rose bush, and current events regarding her son and grandson without difficulty. SLP strongly suspects psychological basis to pt's deficits, which pt acknowledged during session today as well. Pt with much tangential conversation. SLP educated pt and husband about how to maximize memory compensations at home -mostly using writing which appeared to be, from pt and husband report, pt's premorbid means to improve recall. They have calendar at home now which pt reportedly writes all appointments/has all appointments written. Pt homework to write errand stops and/or lists for items to purchase whenever they go anywhere.     PATIENT EDUCATION: Education details: see above in "today's treatment" Person educated: Patient (and Spouse) Education method: Explanation and Demonstration Education comprehension: verbalized  understanding and needs further education       GOALS: Goals reviewed with patient? Yes   SHORT TERM GOALS: Target date: 10/13/2021     Pt will verbalize 3 memory strategies to assist with recall of important information across 3 sessions. Baseline: 10-06-21, 10-11-21 Goal status: Met 10-13-21   2.  Pt will verbalize 3 attention strategies to increase focus and comprehension during conversations across 3 sessions.  Baseline:  Goal status: Not met   3.  Pt will complete cognitive PROM Baseline:  Goal status: Met 10-13-21     LONG TERM GOALS: Target date: 11/10/2021     Pt will demonstrate and/or report successful use of memory strategies to recall important information. Baseline:  Goal status: Ongoing   2.  Pt will demonstrate and/or report successful use of 3 attention strategies to increase focus comprehension during conversations. Baseline:  Goal status: Ongoing       ASSESSMENT:   CLINICAL IMPRESSION: Pt is a 76 yo female who presents to [facility] for tx, with diagnosis of COVID long-hauler. [name] continues very distracted with family matters listed in tx note. SLP cont to rec skilled ST services to address cognitive-communication impairment to increase confidence and maximize functional communication. Pt demonstrated good memory with multiple things today described in note for today. Suspect basis of the majority of pt's sx are psychological - if pt's anxiety does not decr, SLP questions efficacy of ST at this time and today decr'd frequency to once/week. Plan to d/c in 1-3 visits if pt's anxiety does not decr.      OBJECTIVE IMPAIRMENTS include attention and memory. These impairments are limiting patient from managing medications, managing appointments, managing finances, and effectively communicating at home and in community. Factors affecting potential to achieve goals and functional outcome are  levels of anxiety .Marland Kitchen Patient will benefit from skilled SLP services to address above  impairments and improve overall function.   REHAB POTENTIAL: Good   PLAN: SLP FREQUENCY: 2x/week   SLP DURATION: 8 weeks   PLANNED INTERVENTIONS: Environmental controls, Cueing hierachy, Internal/external aids, Functional tasks, SLP instruction and feedback, Compensatory strategies, and Patient/family education     [facility], [place] 11/01/2021, 2:50 PM

## 2021-11-02 ENCOUNTER — Telehealth (INDEPENDENT_AMBULATORY_CARE_PROVIDER_SITE_OTHER): Payer: Medicare Other | Admitting: Family

## 2021-11-02 DIAGNOSIS — H539 Unspecified visual disturbance: Secondary | ICD-10-CM | POA: Insufficient documentation

## 2021-11-02 HISTORY — DX: Unspecified visual disturbance: H53.9

## 2021-11-02 NOTE — Progress Notes (Signed)
MyChart Video Visit    Virtual Visit via Video Note   This visit type was conducted due to national recommendations for restrictions regarding the COVID-19 Pandemic (e.g. social distancing) in an effort to limit this patient's exposure and mitigate transmission in our community. This patient is at least at moderate risk for complications without adequate follow up. This format is felt to be most appropriate for this patient at this time. Physical exam was limited by quality of the video and audio technology used for the visit. CMA was able to get the patient set up on a video visit.  Patient location: Home Patient and provider in visit Provider location: Office  I discussed the limitations of evaluation and management by telemedicine and the availability of in person appointments. The patient expressed understanding and agreed to proceed.  Visit Date: 11/02/2021  Today's healthcare provider: Lemont Fillers, NP     Subjective:    Patient ID: Dawn Simmons, female    DOB: 04-06-1946, 76 y.o.   MRN: [id number]  Chief Complaint  Patient presents with   Loss of Vision    Patient reports loosing vision partially on left eye yesterday for about 45 minutes.    HPI  Patient is in today for a virtual office visit.  Loss of Vision - She complains of loss of vision in her left lateral eye that occurred on 11/01/2021. She describes symptoms of a bright silver color in her line of vision and could not see out of her left eye. Symptoms appeared when she was outside. When she was on her way to an appointment later that day, the light dimmed in her vision and later resolved. Symptoms occurred for about half and hour. She denies of a headache or migraine at the time. She reports that she does have a regular eye specialist that she visits.   Past Medical History:  Diagnosis Date   H/O fibromyalgia    H/O insomnia    H/O measles    H/O rubella    H/O syncope    History of chicken pox  09/15/2019   History of palpitations    Hx of mumps    Mitral valve prolapse    Palpitations    Syncope    Von Willebrand disease ([facility])     Past Surgical History:  Procedure Laterality Date   NO PAST SURGERIES      Family History  Problem Relation Age of Onset   Stroke Mother    Emphysema Father        smoked   Alcohol abuse Father    Heart attack Maternal Grandfather    Heart attack Paternal Grandfather    Heart attack Maternal Uncle    Heart failure Maternal Grandmother    Heart failure Paternal Grandmother    OCD Son    Drug abuse Son    Seizures Neg Hx     Social History   Socioeconomic History   Marital status: Married    Spouse name: tony   Number of children: 4   Years of education: 12   Highest education level: Not on file  Occupational History   Not on file  Tobacco Use   Smoking status: Former    Packs/day: 0.50    Years: 10.00    Total pack years: 5.00    Types: Cigarettes    Quit date: 04/27/1972    Years since quitting: 49.5   Smokeless tobacco: Never  Vaping Use   Vaping Use: Never  used  Substance and Sexual Activity   Alcohol use: Yes    Alcohol/week: 0.0 standard drinks of alcohol    Comment: occ 1 glass wine or beer   Drug use: No   Sexual activity: Not on file  Other Topics Concern   Not on file  Social History Narrative   Lives at home with husband   Caffeine use- tea, 1 cup/day   Social Determinants of Health   Financial Resource Strain: Low Risk  (12/13/2019)   Overall Financial Resource Strain (CARDIA)    Difficulty of Paying Living Expenses: Not hard at all  Food Insecurity: No Food Insecurity (12/13/2019)   Hunger Vital Sign    Worried About Running Out of Food in the Last Year: Never true    Ran Out of Food in the Last Year: Never true  Transportation Needs: No Transportation Needs (02/08/2021)   PRAPARE - Administrator, Civil Service (Medical): No    Lack of Transportation (Non-Medical): No  Physical Activity:  Inactive (02/08/2021)   Exercise Vital Sign    Days of Exercise per Week: 0 days    Minutes of Exercise per Session: 0 min  Stress: Not on file  Social Connections: Not on file  Intimate Partner Violence: Not At Risk (02/08/2021)   Humiliation, Afraid, Rape, and Kick questionnaire    Fear of Current or Ex-Partner: No    Emotionally Abused: No    Physically Abused: No    Sexually Abused: No    Outpatient Medications Prior to Visit  Medication Sig Dispense Refill   Ascorbic Acid (VITAMIN C) 100 MG tablet Take 100 mg by mouth daily.     Calcium-Magnesium 200-100 MG TABS Take 4 tablets by mouth daily. 2 tablets in the morning and 2 tablets in the evening     diazepam (VALIUM) 5 MG tablet Take 1 tablet (5 mg total) by mouth at bedtime. Take 1/2-1 tablet po QHS 90 tablet 0   ergocalciferol (DRISDOL) 200 MCG/ML drops Take by mouth daily. PURE brand     FLUoxetine (PROZAC) 10 MG capsule Take 2 capsules (20 mg total) by mouth every morning. 180 capsule 0   loratadine (CLARITIN) 10 MG tablet Take 10 mg by mouth daily.     MAGNESIUM PO Take 200 mg by mouth at bedtime.     Multiple Vitamin (MULTIVITAMIN) capsule Take 1 capsule by mouth daily.     nystatin (MYCOSTATIN) 100000 UNIT/ML suspension Apply to corners of mouth three x daily 60 mL 1   Omega-3 1000 MG CAPS Take by mouth.     triamcinolone cream (KENALOG) 0.1 % Apply 1 application topically 2 (two) times daily. 80 g 1   TURMERIC PO Take by mouth.     No facility-administered medications prior to visit.    Allergies  Allergen Reactions   Doxycycline Swelling   Tetanus Toxoids Other (See Comments)    PAIN IN NECK AND FACE   Flexeril [Cyclobenzaprine] Other (See Comments)    Review of Systems  Eyes:        (+) Loss of Vision (Left Eye)  Neurological:  Negative for headaches.       Objective:    Physical Exam Constitutional:      Appearance: Normal appearance.  Neurological:     Mental Status: She is alert.     Comments: +  facial symmetry Speech is clear EOM's grossly intact  Psychiatric:        Mood and Affect: Mood normal.  Behavior: Behavior normal.     There were no vitals taken for this visit. Wt Readings from Last 3 Encounters:  10/22/21 122 lb 6.4 oz (55.5 kg)  10/19/21 122 lb 12.8 oz (55.7 kg)  09/10/21 122 lb 9.6 oz (55.6 kg)       Assessment & Plan:   Problem List Items Addressed This Visit       Unprioritized   Visual disturbance - Primary    Resolved.  Differential includes TIA/CVA, Retinal issue, migraine variant.  I will try to get her in to be seen tomorrow with her ophthalmologist.  I would also like for her to complete a face to face visit with me tomorrow for more thorough neuro exam and CVA work up. She is advised to go to the ER if she develops recurrent visual changes, or if she develops numbness/weakness. Pt verbalizes understanding.        No orders of the defined types were placed in this encounter.   I discussed the assessment and treatment plan with the patient. The patient was provided an opportunity to ask questions and all were answered. The patient agreed with the plan and demonstrated an understanding of the instructions.   The patient was advised to call back or seek an in-person evaluation if the symptoms worsen or if the condition fails to improve as anticipated.  I provided 20 minutes of face-to-face time during this encounter.   I,Amber Collins,acting as a Neurosurgeon for [employer], NP.,have documented all relevant documentation on the behalf of Lemont Fillers, NP,as directed by  Lemont Fillers, NP while in the presence of Lemont Fillers, NP.   Lemont Fillers, NP [employer] [facility] [employer] [phone] (phone) [phone] (fax)  [facility]

## 2021-11-02 NOTE — Assessment & Plan Note (Signed)
Resolved.  Differential includes TIA/CVA, Retinal issue, migraine variant.  I will try to get her in to be seen tomorrow with her ophthalmologist.  I would also like for her to complete a face to face visit with me tomorrow for more thorough neuro exam and CVA work up. She is advised to go to the ER if she develops recurrent visual changes, or if she develops numbness/weakness. Pt verbalizes understanding.

## 2021-11-03 ENCOUNTER — Ambulatory Visit (INDEPENDENT_AMBULATORY_CARE_PROVIDER_SITE_OTHER): Payer: Medicare Other | Admitting: Family

## 2021-11-03 VITALS — BP 118/69 | HR 67 | Temp 97.9°F | Resp 16 | Wt 124.0 lb

## 2021-11-03 DIAGNOSIS — H539 Unspecified visual disturbance: Secondary | ICD-10-CM | POA: Diagnosis not present

## 2021-11-03 DIAGNOSIS — I341 Nonrheumatic mitral (valve) prolapse: Secondary | ICD-10-CM

## 2021-11-03 DIAGNOSIS — R413 Other amnesia: Secondary | ICD-10-CM | POA: Diagnosis not present

## 2021-11-03 DIAGNOSIS — Z13220 Encounter for screening for lipoid disorders: Secondary | ICD-10-CM

## 2021-11-03 DIAGNOSIS — G43B0 Ophthalmoplegic migraine, not intractable: Secondary | ICD-10-CM | POA: Diagnosis not present

## 2021-11-03 DIAGNOSIS — H40013 Open angle with borderline findings, low risk, bilateral: Secondary | ICD-10-CM | POA: Diagnosis not present

## 2021-11-03 DIAGNOSIS — H25813 Combined forms of age-related cataract, bilateral: Secondary | ICD-10-CM | POA: Diagnosis not present

## 2021-11-03 LAB — LIPID PANEL
Cholesterol: 227 mg/dL — ABNORMAL HIGH (ref 0–200)
HDL: 62.5 mg/dL (ref >39.00)
LDL Cholesterol: 127 mg/dL — ABNORMAL HIGH (ref 0–99)
NonHDL: 164.61
Total CHOL/HDL Ratio: 4
Triglycerides: 189 mg/dL — ABNORMAL HIGH (ref 0.0–149.0)
VLDL: 37.8 mg/dL (ref 0.0–40.0)

## 2021-11-03 NOTE — Assessment & Plan Note (Signed)
Resolved. Saw Dr. 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  I would like to complete a stroke evaluation to include CT head, carotid dopplers, 2D echo and lipid panel.

## 2021-11-03 NOTE — Progress Notes (Signed)
Subjective:   By signing my name below, I, Parks Ranger, attest that this documentation has been prepared under the direction and in the presence of [name], NP 11/03/2021   Patient ID: Dawn Simmons, female    DOB: 09/10/1945, 76 y.o.   MRN: [id number]  Chief Complaint  Patient presents with   Follow-up    Follow up after virtual visit yesterday   Ocular migraine    New diagnosis per eye doctors    HPI  Patient is in today for follow up visit to further evaluate her visual disturbance that we discussed yesterday her virtual visit.   She is accompanied with her husband.   Ocular Migraine: She saw her ophthalmologist this AM and was diagnosed with ocular migraine. She states that she has not had a migraine since prior to menopause and has never had an optical migraine prior to this. Her vision has returned to normal.   Family History: Her mother had multiple strokes.   Health Maintenance Due  Topic Date Due   MAMMOGRAM  Never done   COVID-19 Vaccine (3 - Pfizer series) 12/06/2019    Past Medical History:  Diagnosis Date   H/O fibromyalgia    H/O insomnia    H/O measles    H/O rubella    H/O syncope    History of chicken pox 09/15/2019   History of palpitations    Hx of mumps    Hyperlipidemia    Mitral valve prolapse    Palpitations    Syncope    Von Willebrand disease (HCC)     Past Surgical History:  Procedure Laterality Date   NO PAST SURGERIES      Family History  Problem Relation Age of Onset   Stroke Mother    Emphysema Father        smoked   Alcohol abuse Father    Heart attack Maternal Grandfather    Heart attack Paternal Grandfather    Heart attack Maternal Uncle    Heart failure Maternal Grandmother    Heart failure Paternal Grandmother    OCD Son    Drug abuse Son    Seizures Neg Hx     Social History   Socioeconomic History   Marital status: Married    Spouse name: tony   Number of children: 4   Years of education:  12   Highest education level: Not on file  Occupational History   Not on file  Tobacco Use   Smoking status: Former    Packs/day: 0.50    Years: 10.00    Total pack years: 5.00    Types: Cigarettes    Quit date: 04/27/1972    Years since quitting: 49.5   Smokeless tobacco: Never  Vaping Use   Vaping Use: Never used  Substance and Sexual Activity   Alcohol use: Yes    Alcohol/week: 0.0 standard drinks of alcohol    Comment: occ 1 glass wine or beer   Drug use: No   Sexual activity: Not on file  Other Topics Concern   Not on file  Social History Narrative   Lives at home with husband   Caffeine use- tea, 1 cup/day   Social Determinants of Health   Financial Resource Strain: Low Risk  (12/13/2019)   Overall Financial Resource Strain (CARDIA)    Difficulty of Paying Living Expenses: Not hard at all  Food Insecurity: No Food Insecurity (12/13/2019)   Hunger Vital Sign    Worried About Running Out  of Food in the Last Year: Never true    Ran Out of Food in the Last Year: Never true  Transportation Needs: No Transportation Needs (02/08/2021)   PRAPARE - Administrator, Civil Service (Medical): No    Lack of Transportation (Non-Medical): No  Physical Activity: Inactive (02/08/2021)   Exercise Vital Sign    Days of Exercise per Week: 0 days    Minutes of Exercise per Session: 0 min  Stress: Not on file  Social Connections: Not on file  Intimate Partner Violence: Not At Risk (02/08/2021)   Humiliation, Afraid, Rape, and Kick questionnaire    Fear of Current or Ex-Partner: No    Emotionally Abused: No    Physically Abused: No    Sexually Abused: No    Outpatient Medications Prior to Visit  Medication Sig Dispense Refill   Ascorbic Acid (VITAMIN C) 100 MG tablet Take 100 mg by mouth daily.     Calcium-Magnesium 200-100 MG TABS Take 4 tablets by mouth daily. 2 tablets in the morning and 2 tablets in the evening     diazepam (VALIUM) 5 MG tablet Take 1 tablet (5 mg  total) by mouth at bedtime. Take 1/2-1 tablet po QHS 90 tablet 0   ergocalciferol (DRISDOL) 200 MCG/ML drops Take by mouth daily. PURE brand     FLUoxetine (PROZAC) 10 MG capsule Take 2 capsules (20 mg total) by mouth every morning. 180 capsule 0   loratadine (CLARITIN) 10 MG tablet Take 10 mg by mouth daily.     MAGNESIUM PO Take 200 mg by mouth at bedtime.     Multiple Vitamin (MULTIVITAMIN) capsule Take 1 capsule by mouth daily.     nystatin (MYCOSTATIN) 100000 UNIT/ML suspension Apply to corners of mouth three x daily 60 mL 1   Omega-3 1000 MG CAPS Take by mouth.     triamcinolone cream (KENALOG) 0.1 % Apply 1 application topically 2 (two) times daily. 80 g 1   TURMERIC PO Take by mouth.     No facility-administered medications prior to visit.    Allergies  Allergen Reactions   Doxycycline Swelling   Tetanus Toxoids Other (See Comments)    PAIN IN NECK AND FACE   Flexeril [Cyclobenzaprine] Other (See Comments)    ROS See HPI    Objective:    Physical Exam Constitutional:      Appearance: Normal appearance. She is not ill-appearing.  HENT:     Head: Normocephalic and atraumatic.     Right Ear: External ear normal.     Left Ear: External ear normal.  Eyes:     Extraocular Movements: Extraocular movements intact.     Right eye: No nystagmus.     Left eye: No nystagmus.     Pupils: Pupils are equal, round, and reactive to light.  Cardiovascular:     Rate and Rhythm: Normal rate and regular rhythm.     Pulses: Normal pulses.     Heart sounds: Normal heart sounds. No murmur heard.    No gallop.  Pulmonary:     Effort: Pulmonary effort is normal. No respiratory distress.     Breath sounds: Normal breath sounds. No wheezing or rales.  Skin:    General: Skin is warm and dry.  Neurological:     Mental Status: She is alert and oriented to person, place, and time.     Cranial Nerves: Cranial nerves 2-12 are intact.     Comments: (Bilateral pupils dilated from ophthalmology  visit)   Psychiatric:        Judgment: Judgment normal.     BP 118/69 (BP Location: Right Arm, Patient Position: Sitting, Cuff Size: Small)   Pulse 67   Temp 97.9 F (36.6 C) (Oral)   Resp 16   Wt 124 lb (56.2 kg)   SpO2 99%   BMI 20.01 kg/m  Wt Readings from Last 3 Encounters:  11/03/21 124 lb (56.2 kg)  10/22/21 122 lb 6.4 oz (55.5 kg)  10/19/21 122 lb 12.8 oz (55.7 kg)       Assessment & Plan:   Problem List Items Addressed This Visit       Unprioritized   Visual disturbance    Resolved. Saw Dr. 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  I would like to complete a stroke evaluation to include CT head, carotid dopplers, 2D echo and lipid panel.       Relevant Orders   ECHOCARDIOGRAM COMPLETE   US Carotid Duplex Bilateral   CT HEAD WO CONTRAST ( )   Mitral valve prolapse   Relevant Orders   ECHOCARDIOGRAM COMPLETE   Memory changes   Relevant Orders   CT HEAD WO CONTRAST ( )   Other Visit Diagnoses     Screening for hyperlipidemia    -  Primary   Relevant Orders   Lipid panel (Completed)        No orders of the defined types were placed in this encounter.   I, Lemont Fillers, NP, personally preformed the services described in this documentation.  All medical record entries made by the scribe were at my direction and in my presence.  I have reviewed the chart and discharge instructions (if applicable) and agree that the record reflects my personal performance and is accurate and complete. 11/03/2021   I,Tinashe Williams,acting as a scribe for Lemont Fillers, NP.,have documented all relevant documentation on the behalf of Lemont Fillers, NP,as directed by  Lemont Fillers, NP while in the presence of Lemont Fillers, NP.      Lemont Fillers, NP

## 2021-11-04 ENCOUNTER — Encounter: Payer: Self-pay | Admitting: Family

## 2021-11-04 ENCOUNTER — Ambulatory Visit (INDEPENDENT_AMBULATORY_CARE_PROVIDER_SITE_OTHER): Payer: Medicare Other | Admitting: Psychology

## 2021-11-04 ENCOUNTER — Telehealth: Payer: Self-pay | Admitting: Family

## 2021-11-04 DIAGNOSIS — E782 Mixed hyperlipidemia: Secondary | ICD-10-CM | POA: Insufficient documentation

## 2021-11-04 DIAGNOSIS — E785 Hyperlipidemia, unspecified: Secondary | ICD-10-CM | POA: Insufficient documentation

## 2021-11-04 DIAGNOSIS — F4310 Post-traumatic stress disorder, unspecified: Secondary | ICD-10-CM

## 2021-11-04 HISTORY — DX: Mixed hyperlipidemia: E78.2

## 2021-11-04 NOTE — Telephone Encounter (Signed)
Please contact pt and let her know that I spoke to Dr. Abner Greenspan and she agrees that it would be a good idea for [place] to complete some addition testing to rule out stroke.  I will place an order for carotid doppler, echo and CT of her head. She should be contacted about scheduling these tests.   Also cholesterol is mildly elevated. I would recommend that she work on a low fat/low cholesterol diet.

## 2021-11-04 NOTE — Progress Notes (Signed)
[facility] Counselor/Therapist Progress Note  Patient ID: Nathalya Wolanski, MRN: [id number],    Date: 11/04/2021  Time Spent: 60 minutes  Treatment Type: Individual Therapy  Reported Symptoms: anxiety and depression and memory loss  Mental Status Exam: Appearance:  Casual     Behavior: Appropriate  Motor: Normal  Speech/Language:  Normal Rate  Affect: Blunt  Mood: pleasant  Thought process: normal  Thought content:   WNL  Sensory/Perceptual disturbances:   WNL  Orientation: oriented to person, place, time/date, and situation  Attention: Good  Concentration: Good  Memory: WNL  Fund of knowledge:  Good  Insight:   Good  Judgment:  Good  Impulse Control: Good   Risk Assessment: Danger to Self:  No Self-injurious Behavior: No Danger to Others: No Duty to Warn:no Physical Aggression / Violence:No  Access to Firearms a concern: No  Gang Involvement:No   Subjective: The patient attended a face-to-face individual therapy session in the office today.  The patient presents with a blunted affect and mood is pleasant.  The patient reports that she is doing okay.  She says that she was a little disappointed because of the reaction that Ronan had to her when she saw him last week.  We talked about this transition being very overwhelming for a 76 year old who just lost their mother.  The patient states that she is continuing her cognitive therapy until Monday of next week and they are referring her somewhere else.  It seems that they may have referred her for neuropsych testing and that will not happen until Later this year or early next year.  Continue to work with patient on balancing things and also on taking care of herself.  Used cognitive behavioral therapy to help patient reframe some negative thinking.    Interventions: Cognitive Behavioral Therapy and Eye Movement Desensitization and Reprocessing (EMDR)  Diagnosis:PTSD (post-traumatic stress disorder)  Plan:  Treatment Plan  Strengths/Abilities:Insightful, motivated, supportive husband  Treatment Preferences: Outpatient Individual therapy  Statement of Needs: "I have a problem passing out and I was told I have PTSD"    Symptoms:  Demonstrates an exaggerated startle response.:(Status: improved). Depressed  or irritable mood.: (Status: improved). Describes a reliving of the event,  particularly through dissociative flashbacks.:  (Status: improved). Displays a  significant decline in interest and engagement in activities.: (Status: improved). Displays significant psychological and/or physiological distress resulting from internal and external  clues that are reminiscent of the traumatic event.: (Status: improved).  Experiences disturbances in sleep.: (Status: improved). Experiences disturbing  and persistent thoughts, images, and/or perceptions of the traumatic event.: (Status: improved). Experiences frequent nightmares.: (Status: improved).  Feelings of hopelessness, worthlessness, or inappropriate guilt.: (Status:  improved). Has been exposed to a traumatic event involving actual or perceived threat of death or  serious injury.: (Status: maintained). Impairment in social, occupational, or  other areas of functioning.: (Status: improved). Intentionally avoids activities,  places, people, or objects (e.g., up-armored vehicles) that evoke memories of the event.:(Status: maintained). Intentionally avoids thoughts, feelings, or discussions related  to the traumatic event.: (Status: improved). Reports difficulty concentrating as  well as feelings of guilt.:  (Status: improved). Reports response of intense fear,  helplessness, or horror to the traumatic event.:  (Status: improved).  Problems Addressed: PTSD   Goals:  LTG:  1. Develop healthy thinking patterns and beliefs about self, others, and the world that lead to the alleviation and help prevent the relapse of  depression.   50% Objective Identify and replace thoughts and  beliefs that support depression.  50 % Target Date: 2022-05-01 Frequency: Weekly Progress: 50 Modality: individual 2. Eliminate or reduce the negative impact trauma related symptoms have  on social, occupational, and family functioning.  50% Objective Learn and implement personal skills to manage challenging situations related to trauma. Target Date: 2022-05-01 Frequency: Weekly Progress: 30 Modality: individual3. No longer avoids persons, places, activities, and objects that are  reminiscent of the traumatic event. Objective 3.  Participate in Eye Movement Desensitization and Reprocessing (EMDR) to reduce emotional distress  related to traumatic thoughts, feelings, and images. Target Date: 2022-05-01 Frequency: Weekly Progress: 30 Modality: individual 4.  Learn and implement guided self-dialogue to manage thoughts, feelings, and urges brought on by  encounters with trauma-related situations. Target Date: 2022-05-01 Frequency: Weekly Progress: 40 Modality: individual 5.  No longer experiences intrusive event recollections, avoidance of event  reminders, intense arousal, or disinterest in activities or  relationships. Target Date:05/01/2022  Frequency : Weekly Progress 30    Modality: individual 6.Thinks about or openly discusses the traumatic event with others  without experiencing psychological or physiological distress. Target Date : 05/01/2022 Frequency: Weekly Progress:30   Modality: Individual Interventions by Therapist:  CBT, problem solving therapy, EMDR, insight oriented.  Patient approved Treatment plan.  Jeannette Maddy G Demitri Kucinski, LCSW                                     Kason Benak G Zailyn Rowser, LCSW               Shruthi Northrup G Cherri Yera, LCSW               Bahar Shelden G Naijah Lacek, LCSW               Nethaniel Mattie G Lexus Barletta, LCSW               Brett Soza G Kaizer Dissinger,  LCSW               Yashas Camilli G Kallan Bischoff, LCSW               Tyde Lamison [employer], LCSW               Myelle Poteat [employer], LCSW               Mikail Goostree G Rhaya Coale, LCSW               Sharene Krikorian G Chelbie Jarnagin, LCSW               Keyandre Pileggi G Cozette Braggs, LCSW               Charlee Squibb G Canyon Lohr, LCSW               Arin Peral G Rainn Bullinger, LCSW               Danira Nylander G Quaid Yeakle, LCSW               Deontra Pereyra G Makenzie Weisner, LCSW               Ryane Canavan G Allannah Kempen, LCSW               Isa Hitz G Gad Aymond, LCSW               Othella Slappey G Paradise Vensel, LCSW               Dotsie Gillette G Terria Deschepper, LCSW               Tian Davison  G Shaneequa Bahner, LCSW               Genine Beckett G Athel Merriweather, LCSW               Myking Sar G Beckham Buxbaum, LCSW               Cariann Kinnamon G Ignace Mandigo, LCSW               Tzirel Leonor G Cartier Mapel, LCSW               Malyah Ohlrich G Yolette Hastings, LCSW               Hadrian Yarbrough G Lanique Gonzalo, LCSW

## 2021-11-04 NOTE — Telephone Encounter (Signed)
Spoke w/ Pt informed of recommendations and results. She verbalized understanding.

## 2021-11-05 ENCOUNTER — Ambulatory Visit (HOSPITAL_BASED_OUTPATIENT_CLINIC_OR_DEPARTMENT_OTHER)
Admission: RE | Admit: 2021-11-05 | Discharge: 2021-11-05 | Disposition: A | Discharge status: Home / Self Care | Payer: Medicare Other | Source: Ambulatory Visit | Attending: Family | Admitting: Family

## 2021-11-05 DIAGNOSIS — H539 Unspecified visual disturbance: Secondary | ICD-10-CM

## 2021-11-05 DIAGNOSIS — H538 Other visual disturbances: Secondary | ICD-10-CM | POA: Diagnosis not present

## 2021-11-05 DIAGNOSIS — E785 Hyperlipidemia, unspecified: Secondary | ICD-10-CM | POA: Diagnosis not present

## 2021-11-05 DIAGNOSIS — R413 Other amnesia: Secondary | ICD-10-CM | POA: Diagnosis not present

## 2021-11-05 DIAGNOSIS — I6523 Occlusion and stenosis of bilateral carotid arteries: Secondary | ICD-10-CM | POA: Diagnosis not present

## 2021-11-05 DIAGNOSIS — R55 Syncope and collapse: Secondary | ICD-10-CM | POA: Diagnosis not present

## 2021-11-08 ENCOUNTER — Ambulatory Visit: Payer: Medicare Other

## 2021-11-08 DIAGNOSIS — M6281 Muscle weakness (generalized): Secondary | ICD-10-CM | POA: Diagnosis not present

## 2021-11-08 DIAGNOSIS — M5441 Lumbago with sciatica, right side: Secondary | ICD-10-CM | POA: Diagnosis not present

## 2021-11-08 DIAGNOSIS — R41841 Cognitive communication deficit: Secondary | ICD-10-CM | POA: Diagnosis not present

## 2021-11-08 NOTE — Therapy (Signed)
OUTPATIENT SPEECH LANGUAGE PATHOLOGY TREATMENT NOTE/Discharge Summary   Patient Name: Dawn Simmons MRN: [id number] DOB:02-Nov-1945, 76 y.o., female Today's Date: 11/08/2021  PCP: Mosie Lukes, MD REFERRING PROVIDER: Gregor Hams, MD  END OF SESSION:   End of Session - 11/08/21 1452     Visit Number 10    Number of Visits 17    Date for SLP Re-Evaluation 11/15/21    SLP Start Time 1452   checked in 1451   SLP Stop Time  1531    SLP Time Calculation (min) 39 min    Activity Tolerance Other (comment)  Limited due to mild anxiety               Past Medical History:  Diagnosis Date   H/O fibromyalgia    H/O insomnia    H/O measles    H/O rubella    H/O syncope    History of chicken pox 09/15/2019   History of palpitations    Hx of mumps    Hyperlipidemia    Mitral valve prolapse    Palpitations    Syncope    Von Willebrand disease ([place])    Past Surgical History:  Procedure Laterality Date   NO PAST SURGERIES     Patient Active Problem List   Diagnosis Date Noted   Hyperlipidemia 11/04/2021   Visual disturbance 11/02/2021   Memory changes 10/19/2021   Low back pain 09/08/2021   Allergies 08/04/2020   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   Right sided weakness 01/10/2020   Left shoulder pain 10/31/2019   Leukopenia 09/15/2019   PTSD (post-traumatic stress disorder) 09/15/2019   History of chicken pox 09/15/2019   Von Willebrand disease ([place])    Upper airway cough syndrome 09/06/2018   Fibromyalgia 06/01/2015   Insomnia 06/01/2015   H/O syncope    Palpitations    Mitral valve prolapse    SPEECH THERAPY DISCHARGE SUMMARY  Visits from Start of Care: 10  Current functional level related to goals / functional outcomes: See below. Little progress made in ST mostly due to pt's higher anxiety levels. Today's session reinforced the usage of memory strategies to assist pt in completing ADLs/IADLs.   Remaining deficits: Memory deficits   Education /  Equipment: Memory compensations, functional ways to use memory strategies   Patient agrees to discharge. Patient goals were partially met. Patient is being discharged due to did not respond to therapy. mostly due to level of anxiety and significant presence of other psychological factors. SLP recommends pt cont with psychological tx.      ONSET DATE:  "A few years", since I got Covid.    REFERRING DIAG:  R41.0 (ICD-10-CM) - Subacute confusional state   THERAPY DIAG: Cognitive communication deficit  Rationale for Evaluation and Treatment Rehabilitation  SUBJECTIVE: Pt told SLP detailed description of her day yesterday with family, her recent ocular migraine and conversations with her sisters afterwards about it.   PAIN:  Are you having pain? No  Pt attended therapy with [name] today.   OBJECTIVE:   TODAY'S TREATMENT:  11/09/19: [name] is crossing off days on the calendar interemittently. Pt has been using alarm to alert her to take vitamins, but is not snoozing alarm if she is not at home like suggested last session.  As today is pt's final scheduled ST day, SLP reviewed practical ways to use all permutations of the 4 memory compensations, with [name] in attendance. [name] provided an example of a practical memory difficulty - yesterday 2-3  hours after watching a TV show [name] asked [name] about it and [name] had no recollection of watching it. SLP suggested every 15 minutes pause DVD and have [name] write down 3-5 things she recalls about the previous 15 minutes in a special notebook, and then  put that notebook in the place she would talk to [name] about it, so she can refer to it later on and converse with [name] about it.  11/01/21: [name] endorsed forgetting to cross off days on the calendar, and also reported she did not use an alarm to take her vitamins. SLP had pt look in her phone and there was not an alarm for 2pm when pt stated she wanted to take her meds. SLP educated pt how to set an alarm  in her iPhone and provided written instructions for her to do this. She req'd mod-max A consistently, faded to mod cues occasionally for attention in following written instructions. Notably, x3, she repeated questions asked < 2 minutes earlier and perseverated on grandson x3 during this segment of the session. Pt also frequently cont to demonstrate decr'd topic maintenance throughout the session. Pt was comfortable with SLP telling [name] a summary of our session today and ask him to attend next session to review memory strategies.   7/17/23[name] commented that she was encouraged by SLP-Golden to cross of the days on her calendar since the previous session, but has not done so recently. When SLP asked [name] about using an alarm to take her meds she said she had not done this yet, however does recall to take her meds without difficulty. Pt recalled 4/4 memory strategies (Write, Repeat, Associate, Picture it) but did not recall practical examples even though she told SLP she was encouraged to cross off days on her calendar as they occurred. SLP reviewed information provided from last week with SLP- Armandina Gemma (practical ways to use "write" and "repeat", and went over practical ways to use "associate" - e.g., pt using her daily routine to recall to take her meds at the same time each day, and "picture it" - use a picture in the mind to recall 3-4 grocery items. With each strategy, pt had tangential conversation requiring redirection. [name] recalled details from Dr. Clovis Riley visit last week about referral for neuropsych eval, and that Dr. Georgina Snell said to call as soon as possible - which pt did when she returned home, and has appt for October. She told SLP she asked receptionist to put her on cancellation list.  Pt perseverative today about a judicial hearing for her grandson and not being able to see him. [name] said that she was anxious about these things, and SLP reiterated that anxiety can definitely complicate ability  to attend to daily events and thus remember them. SLP believes pt is appropriate to decr to once/week, as she is learning increasingly how to use strategies. Level of anxiety is hindering progress.  10/20/21: Pt was able to recall 4/4 of WRAP strategies given extra time. Pt reports she has not used the strategies functionally, other than writing WRAP strategies repetitively. Generated a list of items she could benefit from using W and R for. Write it re: calendar - crossing off days to keep track, writing down new information to help recall. Repeat it re: repeat things you need to remember ex: pick the flowers, pick the flowers, pick the flowers. Also practiced setting alarm to better remember taking vitamins at 2pm every day. Will ask [name] to help set up on personal phone.  Cont with current POC.   10/13/21: Pt began session telling her discomfort with a small hole in her pants. This distracted her numerous times during the session today.  [name] was notably anxious after S statement. SLP assured pt that there was no judgment for pt if she did not recall memory strategies right away. SLP suggested she write down "W" "R" "A" "P" and she recalled the 4 strategies right away. SLP attempted to perform a PROM with pt and she req'd mod A consistently due to off-topic conversation. [name] scored 22/32 (higher score indicates better QOL), with most difficulty learning new tasks, trouble concentrating, paying attention in order to be accurate, slower thinking, and reading something several times to understand it.  To target sustained attention, SLP provided pt with a word search. She demonstrated heavy audible sigh after 90 seconds saying "I'm stuck. I looked down to see if it was a word and now I'm stuck," and sighed again. SLP provided pt with hot pink post it pad and pt moved through two lines and breathed another heavy sigh saying "I'm lost. I dont' know how I'm lost. I truly don't know what to do." SLP cued pt to move  back up two-three lines and cont the search. Occasionally pt would find an animal (e.g., gnu, cow) and checked to see if it was a word to cross off - SLP reminded pt this puzzle indicated [place] names. This explanation did not change pt's behavior x3. SLP explained to pt husband following session that anxiety is a hindering factor for pt.    PATIENT EDUCATION: Education details: see above in "today's treatment" Person educated: Patient (and Spouse) Education method: Explanation and Demonstration Education comprehension: verbalized understanding and needs further education       GOALS: Goals reviewed with patient? Yes   SHORT TERM GOALS: Target date: 10/13/2021     Pt will verbalize 3 memory strategies to assist with recall of important information across 3 sessions. Baseline: 10-06-21, 10-11-21 Goal status: Met 10-13-21   2.  Pt will verbalize 3 attention strategies to increase focus and comprehension during conversations across 3 sessions.  Baseline:  Goal status: Not met   3.  Pt will complete cognitive PROM Baseline:  Goal status: Met 10-13-21     LONG TERM GOALS: Target date: 11/10/2021     Pt will demonstrate and/or report successful use of memory strategies to recall important information. Baseline: crossing off days intermittently Goal status: partially met   2.  Pt will demonstrate and/or report successful use of 3 attention strategies to increase focus comprehension during conversations. Baseline:  Goal status: Not met       ASSESSMENT:   CLINICAL IMPRESSION: Pt is a 76 yo female who presents to [facility] for tx, with diagnosis of COVID long-hauler. She demonstrated some anxiety in today's session - appeared less with [name] in the room, but still complicated her level of attention. She intermittently crossed days off the calendar to assist in temporal orientation. See therapy note for more details. SLP cont to suspect basis of the [name]'s sx are psychological and SLP questions  efficacy of ST at this time. Discharge at this time and SLP recommends continuing with psychological tx.     OBJECTIVE IMPAIRMENTS include attention and memory. These impairments are limiting patient from managing medications, managing appointments, managing finances, and effectively communicating at home and in community. Factors affecting potential to achieve goals and functional outcome are  levels of anxiety. Patient will benefit from skilled SLP  services to address above impairments and improve overall function.   REHAB POTENTIAL: Good   PLAN: SLP FREQUENCY: 2x/week   SLP DURATION: 8 weeks   PLANNED INTERVENTIONS: Environmental controls, Cueing hierachy, Internal/external aids, Functional tasks, SLP instruction and feedback, Compensatory strategies, and Patient/family education     [facility], [place] 11/08/2021, 3:59 PM

## 2021-11-11 ENCOUNTER — Ambulatory Visit (INDEPENDENT_AMBULATORY_CARE_PROVIDER_SITE_OTHER): Payer: Medicare Other | Admitting: Psychology

## 2021-11-11 DIAGNOSIS — F4310 Post-traumatic stress disorder, unspecified: Secondary | ICD-10-CM

## 2021-11-11 NOTE — Progress Notes (Signed)
[facility] Counselor/Therapist Progress Note  Patient ID: Dawn Simmons, MRN: [id number],    Date: 11/11/2021  Time Spent: 60 minutes  Treatment Type: Individual Therapy  Reported Symptoms: anxiety and depression and memory loss  Mental Status Exam: Appearance:  Casual     Behavior: Appropriate  Motor: Normal  Speech/Language:  Normal Rate  Affect: Blunt  Mood: pleasant  Thought process: normal  Thought content:   WNL  Sensory/Perceptual disturbances:   WNL  Orientation: oriented to person, place, time/date, and situation  Attention: Good  Concentration: Good  Memory: WNL  Fund of knowledge:  Good  Insight:   Good  Judgment:  Good  Impulse Control: Good   Risk Assessment: Danger to Self:  No Self-injurious Behavior: No Danger to Others: No Duty to Warn:no Physical Aggression / Violence:No  Access to Firearms a concern: No  Gang Involvement:No   Subjective: The patient attended a face-to-face individual therapy session in the office today.  The patient presents with a blunted affect and mood is pleasant.  The patient's mood is so much better now than it was when she first started therapy.  The patient reports that she feels like she is getting better and she is remembering more.  She did state that she did see her grandson again and he was a little more receptive and they did have a moment where they were able to share some pictures and have a good time.  We continued to talk about her staying in the present moment and she seems not to be going back to the past like she used to do before.  She is doing much better with staying in the now and seems to be in a better head space. Provided supportive therapy and gave her validation for making progress.  Interventions: Cognitive Behavioral Therapy and Eye Movement Desensitization and Reprocessing (EMDR)  Diagnosis:PTSD (post-traumatic stress disorder)  Plan: Treatment Plan  Strengths/Abilities:Insightful,  motivated, supportive husband  Treatment Preferences: Outpatient Individual therapy  Statement of Needs: "I have a problem passing out and I was told I have PTSD"    Symptoms:  Demonstrates an exaggerated startle response.:(Status: improved). Depressed  or irritable mood.: (Status: improved). Describes a reliving of the event,  particularly through dissociative flashbacks.:  (Status: improved). Displays a  significant decline in interest and engagement in activities.: (Status: improved). Displays significant psychological and/or physiological distress resulting from internal and external  clues that are reminiscent of the traumatic event.: (Status: improved).  Experiences disturbances in sleep.: (Status: improved). Experiences disturbing  and persistent thoughts, images, and/or perceptions of the traumatic event.: (Status: improved). Experiences frequent nightmares.: (Status: improved).  Feelings of hopelessness, worthlessness, or inappropriate guilt.: (Status:  improved). Has been exposed to a traumatic event involving actual or perceived threat of death or  serious injury.: (Status: maintained). Impairment in social, occupational, or  other areas of functioning.: (Status: improved). Intentionally avoids activities,  places, people, or objects (e.g., up-armored vehicles) that evoke memories of the event.:(Status: maintained). Intentionally avoids thoughts, feelings, or discussions related  to the traumatic event.: (Status: improved). Reports difficulty concentrating as  well as feelings of guilt.:  (Status: improved). Reports response of intense fear,  helplessness, or horror to the traumatic event.:  (Status: improved).  Problems Addressed: PTSD   Goals:  LTG:  1. Develop healthy thinking patterns and beliefs about self, others, and the world that lead to the alleviation and help prevent the relapse of  depression.  50% Objective Identify and replace thoughts and beliefs  that support  depression.  50 % Target Date: 2022-05-01 Frequency: Weekly Progress: 50 Modality: individual 2. Eliminate or reduce the negative impact trauma related symptoms have  on social, occupational, and family functioning.  50% Objective Learn and implement personal skills to manage challenging situations related to trauma. Target Date: 2022-05-01 Frequency: Weekly Progress: 30 Modality: individual3. No longer avoids persons, places, activities, and objects that are  reminiscent of the traumatic event. Objective 3.  Participate in Eye Movement Desensitization and Reprocessing (EMDR) to reduce emotional distress  related to traumatic thoughts, feelings, and images. Target Date: 2022-05-01 Frequency: Weekly Progress: 30 Modality: individual 4.  Learn and implement guided self-dialogue to manage thoughts, feelings, and urges brought on by  encounters with trauma-related situations. Target Date: 2022-05-01 Frequency: Weekly Progress: 40 Modality: individual 5.  No longer experiences intrusive event recollections, avoidance of event  reminders, intense arousal, or disinterest in activities or  relationships. Target Date:05/01/2022  Frequency : Weekly Progress 30    Modality: individual 6.Thinks about or openly discusses the traumatic event with others  without experiencing psychological or physiological distress. Target Date : 05/01/2022 Frequency: Weekly Progress:30   Modality: Individual Interventions by Therapist:  CBT, problem solving therapy, EMDR, insight oriented.  Patient approved Treatment plan.  Hadiya Spoerl G Hasina Kreager, LCSW                                     Ishaan Villamar G Malay Fantroy, LCSW               Rosamary Boudreau G Svetlana Bagby, LCSW               Terryon Pineiro G Ormond Lazo, LCSW               Kashtyn Jankowski G Antowan Samford, LCSW               Jayle Solarz G Zarya Lasseigne, LCSW               Jamelia Varano G Foye Damron, LCSW               Elster Corbello [employer], LCSW               Lamel Mccarley [employer], LCSW               Journe Hallmark G Thor Nannini, LCSW               Verl Whitmore G Trell Secrist, LCSW               Earnestine Tuohey G Akari [employer], LCSW               Sully Manzi G Aigner Horseman, LCSW               Evangelynn Lochridge G Gricel Copen, LCSW               Tokiko Diefenderfer G Marialy Urbanczyk, LCSW               Esma Kilts G Keya Wynes, LCSW               Cristalle Rohm G Nikitha Mode, LCSW               Alicha Raspberry G Gustavia Carie, LCSW               Mumtaz Lovins G Joshue Badal, LCSW               Cindie Rajagopalan G Jyren Cerasoli, LCSW               Helvi Royals  G Xylon Croom, LCSW               Charmine Bockrath G Lysander Calixte, LCSW               Daxtin Leiker G Kyson Kupper, LCSW               Thomasene Dubow G Bonniejean Piano, LCSW               Diera Wirkkala G Kingsten Enfield, LCSW               Mellina Benison G Marsa Matteo, LCSW               Karsten Howry G Jibreel Fedewa, LCSW               Kamiyah Kindel G Noelly Lasseigne, LCSW

## 2021-11-18 ENCOUNTER — Ambulatory Visit (INDEPENDENT_AMBULATORY_CARE_PROVIDER_SITE_OTHER): Payer: Medicare Other | Admitting: Psychology

## 2021-11-18 DIAGNOSIS — F4310 Post-traumatic stress disorder, unspecified: Secondary | ICD-10-CM

## 2021-11-19 NOTE — Progress Notes (Signed)
[facility] Counselor/Therapist Progress Note  Patient ID: Dawn Simmons, MRN: [id number],    Date: 11/18/2021  Time Spent: 60 minutes  Treatment Type: Individual Therapy  Reported Symptoms: anxiety and depression and memory loss  Mental Status Exam: Appearance:  Casual     Behavior: Appropriate  Motor: Normal  Speech/Language:  Normal Rate  Affect: Blunt  Mood: pleasant  Thought process: normal  Thought content:   WNL  Sensory/Perceptual disturbances:   WNL  Orientation: oriented to person, place, time/date, and situation  Attention: Good  Concentration: Good  Memory: WNL  Fund of knowledge:  Good  Insight:   Good  Judgment:  Good  Impulse Control: Good   Risk Assessment: Danger to Self:  No Self-injurious Behavior: No Danger to Others: No Duty to Warn:no Physical Aggression / Violence:No  Access to Firearms a concern: No  Gang Involvement:No   Subjective: The patient attended a face-to-face individual therapy session in the office today.  The patient presents with a blunted affect and mood is pleasant.  The patient reports that she feels so much better and she is also pleased with what is happening with her grandson who recently came back to live with his father.  She showed me pictures of him at a camp and he seems to be thriving and doing very well.  The patient reports that she feels like she may be getting back to normal now.  We talked about her continuing to do what she needs to do to take care of herself and she is doing that by gardening.  The patient does not seem to be as reactive and going back in the past as much as she was previously.  We will continue to work on this and we will discuss moving to every other week at our next session.   Interventions: Cognitive Behavioral Therapy and Eye Movement Desensitization and Reprocessing (EMDR)  Diagnosis:PTSD (post-traumatic stress disorder)  Plan: Treatment Plan  Strengths/Abilities:Insightful,  motivated, supportive husband  Treatment Preferences: Outpatient Individual therapy  Statement of Needs: "I have a problem passing out and I was told I have PTSD"    Symptoms:  Demonstrates an exaggerated startle response.:(Status: improved). Depressed  or irritable mood.: (Status: improved). Describes a reliving of the event,  particularly through dissociative flashbacks.:  (Status: improved). Displays a  significant decline in interest and engagement in activities.: (Status: improved). Displays significant psychological and/or physiological distress resulting from internal and external  clues that are reminiscent of the traumatic event.: (Status: improved).  Experiences disturbances in sleep.: (Status: improved). Experiences disturbing  and persistent thoughts, images, and/or perceptions of the traumatic event.: (Status: improved). Experiences frequent nightmares.: (Status: improved).  Feelings of hopelessness, worthlessness, or inappropriate guilt.: (Status:  improved). Has been exposed to a traumatic event involving actual or perceived threat of death or  serious injury.: (Status: maintained). Impairment in social, occupational, or  other areas of functioning.: (Status: improved). Intentionally avoids activities,  places, people, or objects (e.g., up-armored vehicles) that evoke memories of the event.:(Status: maintained). Intentionally avoids thoughts, feelings, or discussions related  to the traumatic event.: (Status: improved). Reports difficulty concentrating as  well as feelings of guilt.:  (Status: improved). Reports response of intense fear,  helplessness, or horror to the traumatic event.:  (Status: improved).  Problems Addressed: PTSD   Goals:  LTG:  1. Develop healthy thinking patterns and beliefs about self, others, and the world that lead to the alleviation and help prevent the relapse of  depression.  50% Objective  Identify and replace thoughts and beliefs that support  depression.  50 % Target Date: 2022-05-01 Frequency: Weekly Progress: 50 Modality: individual 2. Eliminate or reduce the negative impact trauma related symptoms have  on social, occupational, and family functioning.  50% Objective Learn and implement personal skills to manage challenging situations related to trauma. Target Date: 2022-05-01 Frequency: Weekly Progress: 30 Modality: individual3. No longer avoids persons, places, activities, and objects that are  reminiscent of the traumatic event. Objective 3.  Participate in Eye Movement Desensitization and Reprocessing (EMDR) to reduce emotional distress  related to traumatic thoughts, feelings, and images. Target Date: 2022-05-01 Frequency: Weekly Progress: 30 Modality: individual 4.  Learn and implement guided self-dialogue to manage thoughts, feelings, and urges brought on by  encounters with trauma-related situations. Target Date: 2022-05-01 Frequency: Weekly Progress: 40 Modality: individual 5.  No longer experiences intrusive event recollections, avoidance of event  reminders, intense arousal, or disinterest in activities or  relationships. Target Date:05/01/2022  Frequency : Weekly Progress 30    Modality: individual 6.Thinks about or openly discusses the traumatic event with others  without experiencing psychological or physiological distress. Target Date : 05/01/2022 Frequency: Weekly Progress:30   Modality: Individual Interventions by Therapist:  CBT, problem solving therapy, EMDR, insight oriented.  Patient approved Treatment plan.  Blasa Raisch G Azusena Erlandson, LCSW                                     Frimy Uffelman G Amia Rynders, LCSW               Mayte Diers G Lajoya Dombek, LCSW               Camar Guyton G Lathan Gieselman, LCSW               Diella Gillingham G Keymoni Mccaster, LCSW               Hardin Hardenbrook G Reshma Hoey, LCSW               Roxane Puerto [employer], LCSW               Cortina Vultaggio [employer], LCSW               Omarian Jaquith [employer], LCSW               Osiah Haring [employer], LCSW               Shantavia Jha G Dorwin Fitzhenry, LCSW               Torrie Namba G Alliah Boulanger, LCSW               Ayansh Feutz G Jhanvi Drakeford, LCSW               Taijuan Serviss G Karel Turpen, LCSW               Latressa Harries G Hipolito Martinezlopez, LCSW               Zully Frane G Marco Raper, LCSW               Sharnette Kitamura G Aadarsh Cozort, LCSW               Slade Pierpoint G Antony Sian, LCSW               Jeovani Weisenburger G Shannyn Jankowiak, LCSW               Mairany Bruno G Shayda Kalka, LCSW  Elridge Stemm G Sharnell Knight, LCSW               Badr Piedra G Shalese Strahan, LCSW               Deanie Jupiter G Lexxie Winberg, LCSW               Bethanee Redondo G Keeva Reisen, LCSW               Ketara Cavness G Vidal Lampkins, LCSW               Shannen Vernon G Yalonda Sample, LCSW               Concettina Leth G Ivanna Kocak, LCSW               Jailen Coward G Dontario Evetts, LCSW               Octavie Westerhold G Glenden Rossell, LCSW

## 2021-11-22 DIAGNOSIS — R413 Other amnesia: Secondary | ICD-10-CM | POA: Diagnosis not present

## 2021-11-23 ENCOUNTER — Ambulatory Visit (INDEPENDENT_AMBULATORY_CARE_PROVIDER_SITE_OTHER): Payer: Medicare Other | Admitting: Psychiatry

## 2021-11-23 ENCOUNTER — Encounter: Payer: Self-pay | Admitting: Psychiatry

## 2021-11-23 DIAGNOSIS — F4310 Post-traumatic stress disorder, unspecified: Secondary | ICD-10-CM

## 2021-11-23 DIAGNOSIS — G4700 Insomnia, unspecified: Secondary | ICD-10-CM

## 2021-11-23 MED ORDER — FLUOXETINE HCL 10 MG PO CAPS
20.0000 mg | ORAL_CAPSULE | Freq: Every morning | ORAL | 0 refills | Status: DC
Start: 2021-11-23 — End: 2022-01-24

## 2021-11-23 MED ORDER — DIAZEPAM 5 MG PO TABS: 5.0000 mg | ORAL_TABLET | Freq: Every day | ORAL | 0 refills | Status: DC

## 2021-11-23 NOTE — Progress Notes (Signed)
Dawn Simmons [id number] 1945/09/02 76 y.o.  Subjective:   Patient ID:  Dawn Simmons is a 76 y.o. (DOB 1945-11-16) female.  Chief Complaint:  Chief Complaint  Patient presents with   Anxiety   Depression    Anxiety    Depression        Past medical history includes anxiety.    Dawn Simmons presents to the office today for follow-up of anxiety and insomnia. She reports that she completed speech therapy.  She reports that she saw Trey Sailors, PhD a neuropsychologist yesterday for evaluation and treatment of cognitive changes.  Her husband is currently out of town and reports "it has been challenging not having him there." She has had more anxiety with her husband being away and their A/C being out. Denies panic attacks. She reports that she has been fidgeting some with anxiety. Denies depressed mood. Energy and motivation have been lower with higher temperatures. "I'm really glad about where I am right now. I feel much better." She reports that her appetite is chronically low.   She reports that she takes 1/2-1 tab of Diazepam at night. She will usually take 1/2 tab unless she is stressed or anxious. She reports that she has vivid dreams nightly, that are both good dreams and occasionally bad dreams. Denies SI.   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 She noticed increased taste and "felt more steady." She feels that benefits from nicotine are diminishing.   She reports that her grandson is now back in her life after the death of his mother. Dawn Simmons is now living with her son and her other grandchildren. "I know that is going to help me get better and better."   Diazepam last filled 09/22/21.   Past Psychiatric Medication Trials: (She reports that she is very sensitive to medication) Diazepam- Prescribed as need with limited improvement.  Prozac- reports that she has to take Prozac 20 mg in divided doses.     PHQ2-9    Flowsheet Row  Office Visit from 10/19/2021 in [facility] at [facility] Office Visit from 09/07/2021 in [place] at Med [facility] Clinical Support from 02/08/2021 in [place] at [facility] Office Visit from 01/25/2021 in [facility] at Med [facility] Clinical Support from 12/13/2019 in [facility] at [facility]  PHQ-2 Total Score 2 2 1 2  0  PHQ-9 Total Score 13 9 -- 7 --        Review of Systems:  Review of Systems  Musculoskeletal:  Negative for back pain and gait problem.  Neurological:  Negative for tremors.  Psychiatric/Behavioral:  Positive for depression.        Please refer to HPI    Medications: I have reviewed the patient's current medications.  Current Outpatient Medications  Medication Sig Dispense Refill   Ascorbic Acid (VITAMIN C) 100 MG tablet Take 100 mg by mouth daily.     Calcium-Magnesium 200-100 MG TABS Take 4 tablets by mouth daily. 2 tablets in the morning and 2 tablets in the evening     [START ON 12/15/2021] diazepam (VALIUM) 5 MG tablet Take 1 tablet (5 mg total) by mouth at bedtime. Take 1/2-1 tablet po QHS 90 tablet 0   ergocalciferol (DRISDOL) 200 MCG/ML drops Take by mouth daily. PURE brand     FLUoxetine (PROZAC) 10 MG capsule Take 2 capsules (20 mg total) by mouth every morning.  180 capsule 0   loratadine (CLARITIN) 10 MG tablet Take 10 mg by mouth daily.     MAGNESIUM PO Take 200 mg by mouth at bedtime.     Multiple Vitamin (MULTIVITAMIN) capsule Take 1 capsule by mouth daily.     nystatin (MYCOSTATIN) 100000 UNIT/ML suspension Apply to corners of mouth three x daily 60 mL 1   Omega-3 1000 MG CAPS Take by mouth.     triamcinolone cream (KENALOG) 0.1 % Apply 1 application topically 2 (two) times daily. 80 g 1   TURMERIC PO Take by mouth.     No current facility-administered medications for this visit.    Medication Side Effects: Other:  Possible vivid dreams  Allergies:  Allergies  Allergen Reactions   Doxycycline Swelling   Tetanus Toxoids Other (See Comments)    PAIN IN NECK AND FACE   Flexeril [Cyclobenzaprine] Other (See Comments)    Past Medical History:  Diagnosis Date   H/O fibromyalgia    H/O insomnia    H/O measles    H/O rubella    H/O syncope    History of chicken pox 09/15/2019   History of palpitations    Hx of mumps    Hyperlipidemia    Mitral valve prolapse    Palpitations    Syncope    Von Willebrand disease (HCC)     Past Medical History, Surgical history, Social history, and Family history were reviewed and updated as appropriate.   Please see review of systems for further details on the patient's review from today.   Objective:   Physical Exam:  There were no vitals taken for this visit.  Physical Exam Constitutional:      General: She is not in acute distress. Musculoskeletal:        General: No deformity.  Neurological:     Mental Status: She is alert and oriented to person, place, and time.     Coordination: Coordination normal.  Psychiatric:        Attention and Perception: Attention and perception normal. She does not perceive auditory or visual hallucinations.        Mood and Affect: Mood is anxious. Mood is not depressed. Affect is not labile, blunt, angry or inappropriate.        Speech: Speech normal.        Behavior: Behavior normal.        Thought Content: Thought content normal. Thought content is not paranoid or delusional. Thought content does not include homicidal or suicidal ideation. Thought content does not include homicidal or suicidal plan.        Cognition and Memory: Memory is impaired.        Judgment: Judgment normal.     Comments: Insight intact     Lab Review:     Component Value Date/Time   NA 140 01/25/2021 1504   K 4.0 01/25/2021 1504   CL 106 01/25/2021 1504   CO2 25 01/25/2021 1504   GLUCOSE 88 01/25/2021 1504   BUN 17 01/25/2021 1504    CREATININE 0.88 01/25/2021 1504   CREATININE 0.97 (H) 01/10/2020 1358   CALCIUM 10.0 01/25/2021 1504   PROT 6.9 01/25/2021 1504   ALBUMIN 4.6 01/25/2021 1504   AST 23 01/25/2021 1504   ALT 15 01/25/2021 1504   ALKPHOS 67 01/25/2021 1504   BILITOT 0.6 01/25/2021 1504       Component Value Date/Time   WBC 4.0 01/25/2021 1504   RBC 4.08 01/25/2021 1504   HGB  12.9 01/25/2021 1504   HCT 38.1 01/25/2021 1504   PLT 272.0 01/25/2021 1504   MCV 93.4 01/25/2021 1504   MCH 31.3 01/10/2020 1358   MCHC 33.9 01/25/2021 1504   RDW 13.1 01/25/2021 1504   LYMPHSABS 1,197 01/10/2020 1358   MONOABS 0.5 10/31/2019 1509   EOSABS 198 01/10/2020 1358   BASOSABS 32 01/10/2020 1358    No results found for: "POCLITH", "LITHIUM"   No results found for: "PHENYTOIN", "PHENOBARB", "VALPROATE", "CBMZ"   .res Assessment: Plan:    Patient seen for 30 minutes and time spent discussing potential benefits of increasing Prozac to improve her anxiety symptoms.  She declines increase and reports that she is typically against taking any medications and is surprised that she has continued to take prescribed medication for this duration.  She reports that she is agreeable to continuing current doses of medication.  Will continue Prozac 20 mg daily for anxiety. Will continue diazepam 5 mg 1/2-1 tab at bedtime for anxiety and insomnia. Pt to follow-up in 2 months or sooner if clinically indicated.  Recommend continuing therapy with Bambi Cottle, LCSW.  Patient advised to contact office with any questions, adverse effects, or acute worsening in signs and symptoms.   Dawn Simmons was seen today for anxiety and depression.  Diagnoses and all orders for this visit:  PTSD (post-traumatic stress disorder) -     diazepam (VALIUM) 5 MG tablet; Take 1 tablet (5 mg total) by mouth at bedtime. Take 1/2-1 tablet po QHS -     FLUoxetine (PROZAC) 10 MG capsule; Take 2 capsules (20 mg total) by mouth every morning.  Insomnia,  unspecified type -     diazepam (VALIUM) 5 MG tablet; Take 1 tablet (5 mg total) by mouth at bedtime. Take 1/2-1 tablet po QHS     Please see After Visit Summary for patient specific instructions.  Future Appointments  Date Time Provider Department Center  12/02/2021  1:00 PM Cottle, Lynnell Dike, LCSW [facility] None  12/06/2021  3:00 PM Bradd Canary, MD [facility]  12/07/2021  3:00 PM [facility] 1 [facility]  12/09/2021  1:00 PM Cottle, Bambi G, LCSW [facility] None  12/16/2021  1:00 PM Cottle, Bambi G, LCSW [facility] None  12/23/2021  1:00 PM Cottle, Bambi G, LCSW [facility] None  12/30/2021  1:00 PM Cottle, Bambi G, LCSW [facility] None  01/20/2022  1:00 PM Cottle, Bambi G, LCSW [facility] None  01/24/2022  1:30 PM Corie Chiquito, PMHNP CP[facility]CP None  01/27/2022  1:00 PM Cottle, Bambi G, LCSW [facility] None  02/03/2022  1:00 PM Cottle, Bambi G, LCSW [facility] None  02/10/2022  1:00 PM Cottle, Bambi G, LCSW [facility] None  02/17/2022  1:00 PM Cottle, Bambi G, LCSW [facility] None  02/24/2022  1:00 PM Cottle, Bambi G, LCSW [facility] None  03/10/2022  1:00 PM Cottle, Bambi G, LCSW [facility] None  03/17/2022  1:00 PM Cottle, Bambi G, LCSW [facility] None  03/24/2022  1:00 PM Cottle, Bambi G, LCSW [facility] None  03/24/2022  2:20 PM Bradd Canary, MD [facility]  03/31/2022  1:00 PM Cottle, Bambi G, LCSW [facility] None  04/07/2022  1:00 PM Cottle, Bambi G, LCSW [facility] None  07/06/2022  8:30 AM Rosann Auerbach, PhD [facility] None  07/06/2022  9:30 AM LBN- NEUROPSYCH TECH [facility] None  07/18/2022  2:30 PM Rosann Auerbach, PhD [facility] None    No orders of the defined types were placed in this encounter.   -------------------------------

## 2021-11-25 ENCOUNTER — Ambulatory Visit (INDEPENDENT_AMBULATORY_CARE_PROVIDER_SITE_OTHER): Payer: Medicare Other | Admitting: Psychology

## 2021-11-25 DIAGNOSIS — F4310 Post-traumatic stress disorder, unspecified: Secondary | ICD-10-CM

## 2021-11-25 NOTE — Progress Notes (Signed)
[facility] Counselor/Therapist Progress Note  Patient ID: Dawn Simmons, MRN: [id number],    Date: 11/25/2021  Time Spent: 60 minutes  Treatment Type: Individual Therapy  Reported Symptoms: anxiety and depression and memory loss  Mental Status Exam: Appearance:  Casual     Behavior: Appropriate  Motor: Normal  Speech/Language:  Normal Rate  Affect: Blunt  Mood: pleasant  Thought process: normal  Thought content:   WNL  Sensory/Perceptual disturbances:   WNL  Orientation: oriented to person, place, time/date, and situation  Attention: Good  Concentration: Good  Memory: WNL  Fund of knowledge:  Good  Insight:   Good  Judgment:  Good  Impulse Control: Good   Risk Assessment: Danger to Self:  No Self-injurious Behavior: No Danger to Others: No Duty to Warn:no Physical Aggression / Violence:No  Access to Firearms a concern: No  Gang Involvement:No   Subjective: The patient attended a face-to-face individual therapy session in the office today.  The patient presents with a blunted affect and mood is pleasant.  The patient reports that she had some cognitive testing done this week and she liked the lady who did the testing.  I asked that she sign a release of information to have the information release to me.  We talked about what was going on in her life and she seems to be handling things very well.  Her husband is out of town this week and will come back tomorrow and I think she has had a new appreciation for him as she has been lonely this week.  The patient is doing much better than she used to do and the PTSD is not causing her as many issues.  She is not nearly as reactive as she has been in the past and she seems to be leveling off and more stable.  We talked about continuing to do weekly visits until after the end of September and then we will consider going to every 2 weeks.   Interventions: Cognitive Behavioral Therapy and Eye Movement Desensitization and  Reprocessing (EMDR)  Diagnosis:PTSD (post-traumatic stress disorder)  Plan: Treatment Plan  Strengths/Abilities:Insightful, motivated, supportive husband  Treatment Preferences: Outpatient Individual therapy  Statement of Needs: "I have a problem passing out and I was told I have PTSD"    Symptoms:  Demonstrates an exaggerated startle response.:(Status: improved). Depressed  or irritable mood.: (Status: improved). Describes a reliving of the event,  particularly through dissociative flashbacks.:  (Status: improved). Displays a  significant decline in interest and engagement in activities.: (Status: improved). Displays significant psychological and/or physiological distress resulting from internal and external  clues that are reminiscent of the traumatic event.: (Status: improved).  Experiences disturbances in sleep.: (Status: improved). Experiences disturbing  and persistent thoughts, images, and/or perceptions of the traumatic event.: (Status: improved). Experiences frequent nightmares.: (Status: improved).  Feelings of hopelessness, worthlessness, or inappropriate guilt.: (Status:  improved). Has been exposed to a traumatic event involving actual or perceived threat of death or  serious injury.: (Status: maintained). Impairment in social, occupational, or  other areas of functioning.: (Status: improved). Intentionally avoids activities,  places, people, or objects (e.g., up-armored vehicles) that evoke memories of the event.:(Status: maintained). Intentionally avoids thoughts, feelings, or discussions related  to the traumatic event.: (Status: improved). Reports difficulty concentrating as  well as feelings of guilt.:  (Status: improved). Reports response of intense fear,  helplessness, or horror to the traumatic event.:  (Status: improved).  Problems Addressed: PTSD   Goals:  LTG:  1. Develop  healthy thinking patterns and beliefs about self, others, and the world that lead to the  alleviation and help prevent the relapse of  depression.  50% Objective Identify and replace thoughts and beliefs that support depression.  50 % Target Date: 2022-05-01 Frequency: Weekly Progress: 50 Modality: individual 2. Eliminate or reduce the negative impact trauma related symptoms have  on social, occupational, and family functioning.  50% Objective Learn and implement personal skills to manage challenging situations related to trauma. Target Date: 2022-05-01 Frequency: Weekly Progress: 30 Modality: individual3. No longer avoids persons, places, activities, and objects that are  reminiscent of the traumatic event. Objective 3.  Participate in Eye Movement Desensitization and Reprocessing (EMDR) to reduce emotional distress  related to traumatic thoughts, feelings, and images. Target Date: 2022-05-01 Frequency: Weekly Progress: 30 Modality: individual 4.  Learn and implement guided self-dialogue to manage thoughts, feelings, and urges brought on by  encounters with trauma-related situations. Target Date: 2022-05-01 Frequency: Weekly Progress: 40 Modality: individual 5.  No longer experiences intrusive event recollections, avoidance of event  reminders, intense arousal, or disinterest in activities or  relationships. Target Date:05/01/2022  Frequency : Weekly Progress 30    Modality: individual 6.Thinks about or openly discusses the traumatic event with others  without experiencing psychological or physiological distress. Target Date : 05/01/2022 Frequency: Weekly Progress:30   Modality: Individual Interventions by Therapist:  CBT, problem solving therapy, EMDR, insight oriented.  Patient approved Treatment plan.  Garielle Mroz G Deddrick Saindon, LCSW                                     Nemiah Kissner G Izel Eisenhardt, LCSW               Isayah Ignasiak G Jayline Kilburg, LCSW               Jamyrah Saur G Oswin Johal, LCSW               Giannie Soliday G Kerrington Greenhalgh,  LCSW               Cully Luckow G Qianna Clagett, LCSW               Peyson Postema [employer], LCSW               Aliahna Statzer [employer], LCSW               Elfrida Pixley [employer], LCSW               Kaliyan Osbourn [employer], LCSW               Georgianna Band G Nikki Rusnak, LCSW               Jewelz Ricklefs G Jerika Wales, LCSW               Yaneli Keithley G Brigid Vandekamp, LCSW               Schawn Byas G Oslo Huntsman, LCSW               Lusero Nordlund G Emylee Decelle, LCSW               Jozalynn Noyce G Lynwood Kubisiak, LCSW               Lashona Schaaf G Ladasia Sircy, LCSW               Amit Leece G Gustavus Haskin, LCSW               Khilee Hendricksen G Marcelyn Ruppe, LCSW  Ramona Ruark G Luvinia Lucy, LCSW               Ziyana Morikawa G Analeya Luallen, LCSW               Nayden Czajka G Aiven Kampe, LCSW               Danita Proud G Shaymus Eveleth, LCSW               Audryanna Zurita G Carter Kassel, LCSW               Cressida Milford G Timiko Offutt, LCSW               Sayra Frisby G Ari Engelbrecht, LCSW               Kateline Kinkade G Roxine Whittinghill, LCSW               Danyel Griess G Caleb Decock, LCSW               Alicyn Klann G Kazi Reppond, LCSW               Humaira Sculley G Avontae Burkhead, LCSW

## 2021-11-29 ENCOUNTER — Ambulatory Visit (HOSPITAL_BASED_OUTPATIENT_CLINIC_OR_DEPARTMENT_OTHER): Payer: Medicare Other

## 2021-11-30 ENCOUNTER — Ambulatory Visit: Payer: Medicare Other | Admitting: Family Medicine

## 2021-12-02 ENCOUNTER — Ambulatory Visit (INDEPENDENT_AMBULATORY_CARE_PROVIDER_SITE_OTHER): Payer: Medicare Other | Admitting: Psychology

## 2021-12-02 DIAGNOSIS — F4310 Post-traumatic stress disorder, unspecified: Secondary | ICD-10-CM

## 2021-12-05 NOTE — Progress Notes (Signed)
[facility] Counselor/Therapist Progress Note  Patient ID: Dawn Simmons, MRN: [id number],    Date: 12/02/2021  Time Spent: 60 minutes  Treatment Type: Individual Therapy  Reported Symptoms: anxiety and depression and memory loss  Mental Status Exam: Appearance:  Casual     Behavior: Appropriate  Motor: Normal  Speech/Language:  Normal Rate  Affect: Blunt  Mood: pleasant  Thought process: normal  Thought content:   WNL  Sensory/Perceptual disturbances:   WNL  Orientation: oriented to person, place, time/date, and situation  Attention: Good  Concentration: Good  Memory: WNL  Fund of knowledge:  Good  Insight:   Good  Judgment:  Good  Impulse Control: Good   Risk Assessment: Danger to Self:  No Self-injurious Behavior: No Danger to Others: No Duty to Warn:no Physical Aggression / Violence:No  Access to Firearms a concern: No  Gang Involvement:No   Subjective: The patient attended a face-to-face individual therapy session in the office today.  The patient presents with a blunted affect and mood is anxious.  I told the patient that I had spoken with Dr. Izell [place] about her testing this week.  I explained to the patient that Dr. Izell [place] was going to meet with her to go over the cognitive testing that she did.  I explained to her that I did not think that Dr. Rueben Bash would continue to see her as she is a neuropsychologist who does testing.  The patient seemed to get more and more agitated the longer the session went on.  Also was attempting to get her to accept that she may not go back to exactly the way she was before she started having problems with passing out.  The patient became more and more anxious by the end of the session.  The patient ended up calling and I spoke with her on the phone after the session and explained to her that I was not sure what Dr. Candy Sledge recommendation would actually be that she would have to meet with her.  The patient seemed very upset  that she was not going to be able to see Dr. Izell [place] again.  I told the patient that if she decided that she wanted to see Dr. Izell Hiram for therapy she could do that and that would be fine for her to switch if she chose to do so.  The patient stated that she did not want to do this however she was very anxious because she for some reason thought she was going to see Dr. Izell Glide on a regular basis.  I explained that I thought Dr. Izell Peach would refer her to a neurologist at some point.  The patient seems anxious by the end of that conversation as well.  We will continue to provide her support and cognitive behavioral therapy and psychoeducation as indicated.  Interventions: Cognitive Behavioral Therapy and Eye Movement Desensitization and Reprocessing (EMDR)  Diagnosis:PTSD (post-traumatic stress disorder)  Plan: Treatment Plan  Strengths/Abilities:Insightful, motivated, supportive husband  Treatment Preferences: Outpatient Individual therapy  Statement of Needs: "I have a problem passing out and I was told I have PTSD"    Symptoms:  Demonstrates an exaggerated startle response.:(Status: improved). Depressed  or irritable mood.: (Status: improved). Describes a reliving of the event,  particularly through dissociative flashbacks.:  (Status: improved). Displays a  significant decline in interest and engagement in activities.: (Status: improved). Displays significant psychological and/or physiological distress resulting from internal and external  clues that are reminiscent of the traumatic event.: (Status: improved).  Experiences disturbances  in sleep.: (Status: improved). Experiences disturbing  and persistent thoughts, images, and/or perceptions of the traumatic event.: (Status: improved). Experiences frequent nightmares.: (Status: improved).  Feelings of hopelessness, worthlessness, or inappropriate guilt.: (Status:  improved). Has been exposed to a traumatic event involving actual or perceived  threat of death or  serious injury.: (Status: maintained). Impairment in social, occupational, or  other areas of functioning.: (Status: improved). Intentionally avoids activities,  places, people, or objects (e.g., up-armored vehicles) that evoke memories of the event.:(Status: maintained). Intentionally avoids thoughts, feelings, or discussions related  to the traumatic event.: (Status: improved). Reports difficulty concentrating as  well as feelings of guilt.:  (Status: improved). Reports response of intense fear,  helplessness, or horror to the traumatic event.:  (Status: improved).  Problems Addressed: PTSD   Goals:  LTG:  1. Develop healthy thinking patterns and beliefs about self, others, and the world that lead to the alleviation and help prevent the relapse of  depression.  50% Objective Identify and replace thoughts and beliefs that support depression.  50 % Target Date: 2022-05-01 Frequency: Weekly Progress: 50 Modality: individual 2. Eliminate or reduce the negative impact trauma related symptoms have  on social, occupational, and family functioning.  50% Objective Learn and implement personal skills to manage challenging situations related to trauma. Target Date: 2022-05-01 Frequency: Weekly Progress: 30 Modality: individual3. No longer avoids persons, places, activities, and objects that are  reminiscent of the traumatic event. Objective 3.  Participate in Eye Movement Desensitization and Reprocessing (EMDR) to reduce emotional distress  related to traumatic thoughts, feelings, and images. Target Date: 2022-05-01 Frequency: Weekly Progress: 30 Modality: individual 4.  Learn and implement guided self-dialogue to manage thoughts, feelings, and urges brought on by  encounters with trauma-related situations. Target Date: 2022-05-01 Frequency: Weekly Progress: 40 Modality: individual 5.  No longer experiences intrusive event recollections, avoidance of event  reminders,  intense arousal, or disinterest in activities or  relationships. Target Date:05/01/2022  Frequency : Weekly Progress 30    Modality: individual 6.Thinks about or openly discusses the traumatic event with others  without experiencing psychological or physiological distress. Target Date : 05/01/2022 Frequency: Weekly Progress:30   Modality: Individual Interventions by Therapist:  CBT, problem solving therapy, EMDR, insight oriented.  Patient approved Treatment plan.  Kvon Mcilhenny G Emeterio Balke, LCSW                                     Hance Caspers G Brighten Orndoff, LCSW               Dermot Gremillion G Jaye Saal, LCSW               Sloane Junkin G Aryssa Rosamond, LCSW               Brayden Betters G Trajon Rosete, LCSW               Gianluca Chhim G Jasiya Markie, LCSW               Charde Macfarlane G Forrester Blando, LCSW               Aizah Gehlhausen G Hershy Flenner, LCSW               Melvin Marmo G Jenkins Risdon, LCSW               Aamari West G Giulian Goldring, LCSW               Keonta Monceaux G Earlee Herald, LCSW  Jerrett Baldinger G Atlantis Delong, LCSW               Aby Gessel G Jazyah Butsch, LCSW               Kennidy Lamke G Annalyce Lanpher, LCSW               Temple Sporer G Lathan Gieselman, LCSW               Benita Boonstra G Emani Morad, LCSW               Clarrissa Shimkus G Jenese Mischke, LCSW               Sephiroth Mcluckie [employer], LCSW               Hayla Hinger [employer], LCSW               Laneka Mcgrory [employer], LCSW               Glade Strausser [employer], LCSW               Tashema Tiller G Ashtan Girtman, LCSW               Shea Swalley G Sunshine Mackowski, LCSW               Grant Swager G Elyse Prevo, LCSW               Annaliz Aven G Shahram Alexopoulos, LCSW               Garima Chronis G Amayra Kiedrowski, LCSW               Einar Nolasco G Kolbie Clarkston, LCSW               Onesti Bonfiglio G Graysen Depaula, LCSW               Aurielle Slingerland G Cordel Drewes,  LCSW               Zyaire Mccleod G Karlon Schlafer, LCSW               Desare Duddy G Demarqus Jocson, LCSW

## 2021-12-06 ENCOUNTER — Ambulatory Visit (INDEPENDENT_AMBULATORY_CARE_PROVIDER_SITE_OTHER): Payer: Medicare Other | Admitting: Family Medicine

## 2021-12-06 DIAGNOSIS — R413 Other amnesia: Secondary | ICD-10-CM

## 2021-12-06 DIAGNOSIS — R002 Palpitations: Secondary | ICD-10-CM

## 2021-12-06 DIAGNOSIS — U099 Post covid-19 condition, unspecified: Secondary | ICD-10-CM

## 2021-12-06 DIAGNOSIS — E785 Hyperlipidemia, unspecified: Secondary | ICD-10-CM | POA: Diagnosis not present

## 2021-12-06 NOTE — Patient Instructions (Addendum)
Send me a note when the echo is done and whenever you see anyone let us know

## 2021-12-06 NOTE — Assessment & Plan Note (Signed)
Tolerating statin, encouraged heart healthy diet, avoid trans fats, minimize simple carbs and saturated fats. Increase exercise as tolerated 

## 2021-12-06 NOTE — Assessment & Plan Note (Signed)
She ontinues to struggle with memory and processing. She has had neuropsychology testing with Jenna Renfro but has not gotten the results yet. We will request the records today. She feels her pain, fatigue and memory have never fully recovered.

## 2021-12-07 ENCOUNTER — Ambulatory Visit (HOSPITAL_BASED_OUTPATIENT_CLINIC_OR_DEPARTMENT_OTHER)
Admission: RE | Admit: 2021-12-07 | Discharge: 2021-12-07 | Disposition: A | Discharge status: Home / Self Care | Payer: Medicare Other | Source: Ambulatory Visit | Attending: Family | Admitting: Family

## 2021-12-07 DIAGNOSIS — H539 Unspecified visual disturbance: Secondary | ICD-10-CM | POA: Diagnosis not present

## 2021-12-07 DIAGNOSIS — I341 Nonrheumatic mitral (valve) prolapse: Secondary | ICD-10-CM | POA: Insufficient documentation

## 2021-12-08 LAB — ECHOCARDIOGRAM COMPLETE
AR max vel: 1.91 cm2
AV Area VTI: 2.22 cm2
AV Area mean vel: 1.93 cm2
AV Mean grad: 3 mmHg
AV Peak grad: 4.7 mmHg
Ao pk vel: 1.08 m/s
Area-P 1/2: 3.48 cm2
S' Lateral: 2.7 cm

## 2021-12-08 NOTE — Progress Notes (Signed)
Subjective:    Patient ID: Dawn Simmons, female    DOB: 15-Oct-1945, 76 y.o.   MRN: [id number]  Chief Complaint  Patient presents with   Follow-up    Here for follow up    HPI Patient is in today for follow up on chronic medical concerns. No recent febrile illness or acute hospitalizations. She is here today accompanied by her husband they report her memory continues to be a concern. She has had neuropsychology memory testing and is awaiting results.Denies CP/palp/SOB/HA/congestion/fevers/GI or GU c/o. Taking meds as prescribed   Past Medical History:  Diagnosis Date   H/O fibromyalgia    H/O insomnia    H/O measles    H/O rubella    H/O syncope    History of chicken pox 09/15/2019   History of palpitations    Hx of mumps    Hyperlipidemia    Mitral valve prolapse    Palpitations    Syncope    Von Willebrand disease ([place])     Past Surgical History:  Procedure Laterality Date   NO PAST SURGERIES      Family History  Problem Relation Age of Onset   Stroke Mother    Emphysema Father        smoked   Alcohol abuse Father    Heart attack Maternal Grandfather    Heart attack Paternal Grandfather    Heart attack Maternal Uncle    Heart failure Maternal Grandmother    Heart failure Paternal Grandmother    OCD Son    Drug abuse Son    Seizures Neg Hx     Social History   Socioeconomic History   Marital status: Married    Spouse name: tony   Number of children: 4   Years of education: 12   Highest education level: Not on file  Occupational History   Not on file  Tobacco Use   Smoking status: Former    Packs/day: 0.50    Years: 10.00    Total pack years: 5.00    Types: Cigarettes    Quit date: 04/27/1972    Years since quitting: 49.6   Smokeless tobacco: Never  Vaping Use   Vaping Use: Never used  Substance and Sexual Activity   Alcohol use: Yes    Alcohol/week: 0.0 standard drinks of alcohol    Comment: occ 1 glass wine or beer   Drug use: No    Sexual activity: Not on file  Other Topics Concern   Not on file  Social History Narrative   Lives at home with husband   Caffeine use- tea, 1 cup/day   Social Determinants of Health   Financial Resource Strain: Low Risk  (12/13/2019)   Overall Financial Resource Strain (CARDIA)    Difficulty of Paying Living Expenses: Not hard at all  Food Insecurity: No Food Insecurity (12/13/2019)   Hunger Vital Sign    Worried About Running Out of Food in the Last Year: Never true    Ran Out of Food in the Last Year: Never true  Transportation Needs: No Transportation Needs (02/08/2021)   PRAPARE - Hydrologist (Medical): No    Lack of Transportation (Non-Medical): No  Physical Activity: Inactive (02/08/2021)   Exercise Vital Sign    Days of Exercise per Week: 0 days    Minutes of Exercise per Session: 0 min  Stress: Not on file  Social Connections: Not on file  Intimate Partner Violence: Not At Risk (02/08/2021)  Humiliation, Afraid, Rape, and Kick questionnaire    Fear of Current or Ex-Partner: No    Emotionally Abused: No    Physically Abused: No    Sexually Abused: No    Outpatient Medications Prior to Visit  Medication Sig Dispense Refill   Ascorbic Acid (VITAMIN C) 100 MG tablet Take 100 mg by mouth daily.     Calcium-Magnesium 200-100 MG TABS Take 4 tablets by mouth daily. 2 tablets in the morning and 2 tablets in the evening     [START ON 12/15/2021] diazepam (VALIUM) 5 MG tablet Take 1 tablet (5 mg total) by mouth at bedtime. Take 1/2-1 tablet po QHS 90 tablet 0   ergocalciferol (DRISDOL) 200 MCG/ML drops Take by mouth daily. PURE brand     FLUoxetine (PROZAC) 10 MG capsule Take 2 capsules (20 mg total) by mouth every morning. 180 capsule 0   loratadine (CLARITIN) 10 MG tablet Take 10 mg by mouth daily.     MAGNESIUM PO Take 200 mg by mouth at bedtime.     Multiple Vitamin (MULTIVITAMIN) capsule Take 1 capsule by mouth daily.     nystatin (MYCOSTATIN)  100000 UNIT/ML suspension Apply to corners of mouth three x daily 60 mL 1   Omega-3 1000 MG CAPS Take by mouth.     triamcinolone cream (KENALOG) 0.1 % Apply 1 application topically 2 (two) times daily. 80 g 1   TURMERIC PO Take by mouth.     No facility-administered medications prior to visit.    Allergies  Allergen Reactions   Doxycycline Swelling   Tetanus Toxoids Other (See Comments)    PAIN IN NECK AND FACE   Flexeril [Cyclobenzaprine] Other (See Comments)    Review of Systems  Constitutional:  Positive for malaise/fatigue. Negative for fever.  HENT:  Negative for congestion.   Eyes:  Negative for blurred vision.  Respiratory:  Negative for shortness of breath.   Cardiovascular:  Negative for chest pain, palpitations and leg swelling.  Gastrointestinal:  Negative for abdominal pain, blood in stool and nausea.  Genitourinary:  Negative for dysuria and frequency.  Musculoskeletal:  Negative for falls.  Skin:  Negative for rash.  Neurological:  Negative for dizziness, loss of consciousness and headaches.  Endo/Heme/Allergies:  Negative for environmental allergies.  Psychiatric/Behavioral:  Positive for memory loss. Negative for depression. The patient is nervous/anxious.        Objective:    Physical Exam Constitutional:      General: She is not in acute distress.    Appearance: She is well-developed.  HENT:     Head: Normocephalic and atraumatic.  Eyes:     Conjunctiva/sclera: Conjunctivae normal.  Neck:     Thyroid: No thyromegaly.  Cardiovascular:     Rate and Rhythm: Normal rate and regular rhythm.     Heart sounds: Normal heart sounds. No murmur heard. Pulmonary:     Effort: Pulmonary effort is normal. No respiratory distress.     Breath sounds: Normal breath sounds.  Abdominal:     General: Bowel sounds are normal. There is no distension.     Palpations: Abdomen is soft. There is no mass.     Tenderness: There is no abdominal tenderness.  Musculoskeletal:      Cervical back: Neck supple.  Lymphadenopathy:     Cervical: No cervical adenopathy.  Skin:    General: Skin is warm and dry.  Neurological:     Mental Status: She is alert and oriented to person, place, and time.  Psychiatric:        Behavior: Behavior normal.     BP 110/64 (BP Location: Right Arm, Patient Position: Sitting, Cuff Size: Normal)   Pulse 90   Temp 98 F (36.7 C) (Oral)   Resp 16   Ht 5\' 5"  (1.651 m)   Wt 121 lb 6.4 oz (55.1 kg)   SpO2 98%   BMI 20.20 kg/m  Wt Readings from Last 3 Encounters:  12/06/21 121 lb 6.4 oz (55.1 kg)  11/03/21 124 lb (56.2 kg)  10/22/21 122 lb 6.4 oz (55.5 kg)    Diabetic Foot Exam - Simple   No data filed    Lab Results  Component Value Date   WBC 4.0 01/25/2021   HGB 12.9 01/25/2021   HCT 38.1 01/25/2021   PLT 272.0 01/25/2021   GLUCOSE 88 01/25/2021   CHOL 227 (H) 11/03/2021   TRIG 189.0 (H) 11/03/2021   HDL 62.50 11/03/2021   LDLCALC 127 (H) 11/03/2021   ALT 15 01/25/2021   AST 23 01/25/2021   NA 140 01/25/2021   K 4.0 01/25/2021   CL 106 01/25/2021   CREATININE 0.88 01/25/2021   BUN 17 01/25/2021   CO2 25 01/25/2021   TSH 1.83 01/25/2021    Lab Results  Component Value Date   TSH 1.83 01/25/2021   Lab Results  Component Value Date   WBC 4.0 01/25/2021   HGB 12.9 01/25/2021   HCT 38.1 01/25/2021   MCV 93.4 01/25/2021   PLT 272.0 01/25/2021   Lab Results  Component Value Date   NA 140 01/25/2021   K 4.0 01/25/2021   CO2 25 01/25/2021   GLUCOSE 88 01/25/2021   BUN 17 01/25/2021   CREATININE 0.88 01/25/2021   BILITOT 0.6 01/25/2021   ALKPHOS 67 01/25/2021   AST 23 01/25/2021   ALT 15 01/25/2021   PROT 6.9 01/25/2021   ALBUMIN 4.6 01/25/2021   CALCIUM 10.0 01/25/2021   GFR 64.86 01/25/2021   Lab Results  Component Value Date   CHOL 227 (H) 11/03/2021   Lab Results  Component Value Date   HDL 62.50 11/03/2021   Lab Results  Component Value Date   LDLCALC 127 (H) 11/03/2021   Lab  Results  Component Value Date   TRIG 189.0 (H) 11/03/2021   Lab Results  Component Value Date   CHOLHDL 4 11/03/2021   No results found for: "HGBA1C"     Assessment & Plan:   Problem List Items Addressed This Visit     Palpitations    No significant flares      COVID-19 long hauler    She ontinues to struggle with memory and processing. She has had neuropsychology testing with Jenna Renfro but has not gotten the results yet. We will request the records today. She feels her pain, fatigue and memory have never fully recovered.       Memory changes    She has had neuropsychology testing recently and is awaiting follow up to discuss the results.      Hyperlipidemia    Tolerating statin, encouraged heart healthy diet, avoid trans fats, minimize simple carbs and saturated fats. Increase exercise as tolerated       I am having 11/05/2021 maintain her multivitamin, Calcium-Magnesium, vitamin C, ergocalciferol (VITAMIN D2), MAGNESIUM PO, Omega-3, TURMERIC PO, loratadine, nystatin, triamcinolone cream, diazepam, and FLUoxetine.  No orders of the defined types were placed in this encounter.    [name], MD

## 2021-12-08 NOTE — Assessment & Plan Note (Signed)
No significant flares

## 2021-12-08 NOTE — Assessment & Plan Note (Signed)
She has had neuropsychology testing recently and is awaiting follow up to discuss the results.

## 2021-12-09 ENCOUNTER — Ambulatory Visit (INDEPENDENT_AMBULATORY_CARE_PROVIDER_SITE_OTHER): Payer: Medicare Other | Admitting: Psychology

## 2021-12-09 DIAGNOSIS — F4310 Post-traumatic stress disorder, unspecified: Secondary | ICD-10-CM

## 2021-12-09 DIAGNOSIS — R413 Other amnesia: Secondary | ICD-10-CM | POA: Diagnosis not present

## 2021-12-09 NOTE — Progress Notes (Signed)
[facility] Counselor/Therapist Progress Note  Patient ID: Dawn Simmons, MRN: [id number],    Date: 12/09/2021  Time Spent: 60 minutes  Treatment Type: Individual Therapy  Reported Symptoms: anxiety and depression and memory loss  Mental Status Exam: Appearance:  Casual     Behavior: Appropriate  Motor: Normal  Speech/Language:  Normal Rate  Affect: Blunt  Mood: anxious  Thought process: normal  Thought content:   WNL  Sensory/Perceptual disturbances:   WNL  Orientation: oriented to person, place, time/date, and situation  Attention: Good  Concentration: Good  Memory: WNL  Fund of knowledge:  Good  Insight:   Good  Judgment:  Good  Impulse Control: Good   Risk Assessment: Danger to Self:  No Self-injurious Behavior: No Danger to Others: No Duty to Warn:no Physical Aggression / Violence:No  Access to Firearms a concern: No  Gang Involvement:No   Subjective: The patient attended a face-to-face individual therapy session in the office today.  The patient presents with a blunted affect and mood is anxious.  The patient seems to be in a better spot with going to see Dr. Izell [place] and knowing that she is going to be referred out.  After we spoke last week she seems to have accepted that that is what the plan is.  She is supposed to seeing her today and get the results of her tests.  The patient talked about being concerned about Ronan and interacting with him or not interacting with him she seems very anxious about trying to do that.  It seems that she had unrealistic expectations of what he might remember.  I explained to her that it is just a matter of her spending more time with him and letting her him get to know her again.  The patient seems terrified to do this.  I encouraged her to think about what she wants to bake for him and try to invite him over with Micah just to spend some time together.  I also recommended that the patient begin dancing again as she has  not been doing that and has not had any doing brain stimulation for some time.  We will continue to work with patient on having more realistic expectations and stopping her black and white thinking.   Interventions: Cognitive Behavioral Therapy and Eye Movement Desensitization and Reprocessing (EMDR)  Diagnosis:PTSD (post-traumatic stress disorder)  Plan: Treatment Plan  Strengths/Abilities:Insightful, motivated, supportive husband  Treatment Preferences: Outpatient Individual therapy  Statement of Needs: "I have a problem passing out and I was told I have PTSD"    Symptoms:  Demonstrates an exaggerated startle response.:(Status: improved). Depressed  or irritable mood.: (Status: improved). Describes a reliving of the event,  particularly through dissociative flashbacks.:  (Status: improved). Displays a  significant decline in interest and engagement in activities.: (Status: improved). Displays significant psychological and/or physiological distress resulting from internal and external  clues that are reminiscent of the traumatic event.: (Status: improved).  Experiences disturbances in sleep.: (Status: improved). Experiences disturbing  and persistent thoughts, images, and/or perceptions of the traumatic event.: (Status: improved). Experiences frequent nightmares.: (Status: improved).  Feelings of hopelessness, worthlessness, or inappropriate guilt.: (Status:  improved). Has been exposed to a traumatic event involving actual or perceived threat of death or  serious injury.: (Status: maintained). Impairment in social, occupational, or  other areas of functioning.: (Status: improved). Intentionally avoids activities,  places, people, or objects (e.g., up-armored vehicles) that evoke memories of the event.:(Status: maintained). Intentionally avoids thoughts, feelings, or discussions related  to the traumatic event.: (Status: improved). Reports difficulty concentrating as  well as feelings  of guilt.:  (Status: improved). Reports response of intense fear,  helplessness, or horror to the traumatic event.:  (Status: improved).  Problems Addressed: PTSD   Goals:  LTG:  1. Develop healthy thinking patterns and beliefs about self, others, and the world that lead to the alleviation and help prevent the relapse of  depression.  50% Objective Identify and replace thoughts and beliefs that support depression.  50 % Target Date: 2022-05-01 Frequency: Weekly Progress: 50 Modality: individual 2. Eliminate or reduce the negative impact trauma related symptoms have  on social, occupational, and family functioning.  50% Objective Learn and implement personal skills to manage challenging situations related to trauma. Target Date: 2022-05-01 Frequency: Weekly Progress: 30 Modality: individual3. No longer avoids persons, places, activities, and objects that are  reminiscent of the traumatic event. Objective 3.  Participate in Eye Movement Desensitization and Reprocessing (EMDR) to reduce emotional distress  related to traumatic thoughts, feelings, and images. Target Date: 2022-05-01 Frequency: Weekly Progress: 30 Modality: individual 4.  Learn and implement guided self-dialogue to manage thoughts, feelings, and urges brought on by  encounters with trauma-related situations. Target Date: 2022-05-01 Frequency: Weekly Progress: 40 Modality: individual 5.  No longer experiences intrusive event recollections, avoidance of event  reminders, intense arousal, or disinterest in activities or  relationships. Target Date:05/01/2022  Frequency : Weekly Progress 30    Modality: individual 6.Thinks about or openly discusses the traumatic event with others  without experiencing psychological or physiological distress. Target Date : 05/01/2022 Frequency: Weekly Progress:30   Modality: Individual Interventions by Therapist:  CBT, problem solving therapy, EMDR, insight oriented.  Patient approved  Treatment plan.  Elden Brucato G Meiling Hendriks, LCSW                                     Blondina Coderre G Aaryanna Hyden, LCSW               Vere Diantonio G Jett Kulzer, LCSW               Asharia Lotter G Cherylann Hobday, LCSW               Laressa Bolinger G Finlay Mills, LCSW               Neilah Fulwider G Avrian Delfavero, LCSW               Tiaira Arambula G Tonianne Fine, LCSW               Thelma Viana [employer], LCSW               Cadence Haslam G Metha Kolasa, LCSW               Leilanee Righetti G Shannell Mikkelsen, LCSW               Arjan Strohm G Tashira Torre, LCSW               Maalle Starrett G Darthy Manganelli, LCSW               Jakki Doughty G Michaelene Dutan, LCSW               Aaban Griep G Buena Boehm, LCSW               Kiauna Zywicki G Kinzi Frediani, LCSW               Matsuko Kretz G Janiel Crisostomo, LCSW  Fabienne Nolasco G Jaleiyah Alas, LCSW               Elianys Conry G Shrika Milos, LCSW               Reginald Mangels G Jewelia Bocchino, LCSW               Marca Gadsby G Lorenda Grecco, LCSW               Bryella Diviney G Dalten Ambrosino, LCSW               Maryam Feely G Elon Eoff, LCSW               Samariyah Cowles G Danita Proud, LCSW               Jaxden Blyden [employer], LCSW               Chalise Pe G Hazle Ogburn, LCSW               Kanyon Bunn G Trinitey Roache, LCSW               Meghanne Pletz G Tammee Thielke, LCSW               Dyron Kawano G Patrece Tallie, LCSW               Teal Raben G Sharia Averitt, LCSW               Emberlie Gotcher G Ferdinando Lodge, LCSW               Lucendia Leard G Beyonce Sawatzky, LCSW               Aamari Strawderman G Kaetlyn Noa, LCSW

## 2021-12-15 ENCOUNTER — Telehealth: Payer: Self-pay | Admitting: Psychiatry

## 2021-12-15 ENCOUNTER — Telehealth: Payer: Self-pay | Admitting: Family Medicine

## 2021-12-15 NOTE — Telephone Encounter (Signed)
Please ask her to sign an information release for the neuropsychological evaluation that Dr. Rueben Bash completed and/or request that she send a copy to this office. It may also be helpful for her therapist, Bambi Cottle, LCSW, to have a copy.

## 2021-12-15 NOTE — Telephone Encounter (Signed)
I received the neuropsychological evaluation.  Please schedule a follow-up visit to discuss the results in full detail.

## 2021-12-15 NOTE — Telephone Encounter (Signed)
Pt called in at 11:38am and LM. She needs to discuss medication changes.

## 2021-12-15 NOTE — Telephone Encounter (Signed)
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 She is struggling with this and not being able to find someone to help her. She thought you were a neuropsychiatrist. She saw Kathreen Cornfield, who is a Web designer. She referred her to Dr. Denyse Amass. She sees Chiropodist. She said she is at her wits end and is asking for someone to help her. She says she wants to get better. She has had lots of testing/imaging. She has an appt with Rosann Auerbach in March but said she can't wait that long. Dr. Denyse Amass has received the testing from [facility] based on his note, but I don't see that we have anything scanned into Epic at this time.   Patient was not asking for medication, just wants help to get better. Her decline in cognitive function is her biggest concern. She has not seen a provider who specializes in long COVID.

## 2021-12-15 NOTE — Telephone Encounter (Signed)
Called and scheduled pt for a f/u visit on 9/11.

## 2021-12-15 NOTE — Telephone Encounter (Signed)
Patient notified. She will request Dr. Rueben Bash send [place] her evaluation.

## 2021-12-16 ENCOUNTER — Ambulatory Visit (INDEPENDENT_AMBULATORY_CARE_PROVIDER_SITE_OTHER): Payer: Medicare Other | Admitting: Psychology

## 2021-12-16 DIAGNOSIS — F4310 Post-traumatic stress disorder, unspecified: Secondary | ICD-10-CM

## 2021-12-17 NOTE — Progress Notes (Signed)
Dawn Simmons  Patient ID: Dawn Simmons, MRN: [id number],    Date: 12/16/2021  Time Spent: 60 minutes  Treatment Type: Individual Therapy  Reported Symptoms: anxiety and depression and memory loss  Mental Status Exam: Appearance:  Casual     Behavior: Appropriate  Motor: Normal  Speech/Language:  Normal Rate  Affect: Blunt  Mood: anxious  Thought process: normal  Thought content:   WNL  Sensory/Perceptual disturbances:   WNL  Orientation: oriented to person, place, time/date, and situation  Attention: Good  Concentration: Good  Memory: WNL  Fund of knowledge:  Good  Insight:   Good  Judgment:  Good  Impulse Control: Good   Risk Assessment: Danger to Self:  No Self-injurious Behavior: No Danger to Others: No Duty to Warn:no Physical Aggression / Violence:No  Access to Firearms a concern: No  Gang Involvement:No   Subjective: The patient attended a face-to-face individual therapy session in the office today.  The patient presents with a blunted affect and mood is anxious.  The patient is a little less anxious than she was the last time that she was here however she possibly could be a little angry with me because I have not been telling her what she wants to hear.  She does not say that but she said that she would like to go to every other week and by the end of the session she had changed her mind back and says she wants to come every week still.  The patient went to see Dr. Izell [place] and apparently got the results of the tests and she could not remember what was said and I had to have her husband come into the session to tell me what was said.  According to him Dr. Izell [place] is recommending possibly ketamine or traditional [place] medicine.  The patient's husband did not understand or know what the results of the test were and we are supposed to get those results at some point.  The patient is against doing the ketamine treatment as  she is very stressed about that and feels like that might be something scary.  I explained to her that she does not have to do anything she does not want to do or choose to do.  The good news is is that they do that treatment at [facility] if she is willing to do it.  I will wait to get the results of the testing and work with the patient to determine what course of treatment she prefers.  Interventions: Cognitive Behavioral Therapy and Eye Movement Desensitization and Reprocessing (EMDR)  Diagnosis:PTSD (post-traumatic stress disorder)  Plan: Treatment Plan  Strengths/Abilities:Insightful, motivated, supportive husband  Treatment Preferences: Outpatient Individual therapy  Statement of Needs: "I have a problem passing out and I was told I have PTSD"    Symptoms:  Demonstrates an exaggerated startle response.:(Status: improved). Depressed  or irritable mood.: (Status: improved). Describes a reliving of the event,  particularly through dissociative flashbacks.:  (Status: improved). Displays a  significant decline in interest and engagement in activities.: (Status: improved). Displays significant psychological and/or physiological distress resulting from internal and external  clues that are reminiscent of the traumatic event.: (Status: improved).  Experiences disturbances in sleep.: (Status: improved). Experiences disturbing  and persistent thoughts, images, and/or perceptions of the traumatic event.: (Status: improved). Experiences frequent nightmares.: (Status: improved).  Feelings of hopelessness, worthlessness, or inappropriate guilt.: (Status:  improved). Has been exposed to a traumatic event involving actual or perceived threat of death  or  serious injury.: (Status: maintained). Impairment in social, occupational, or  other areas of functioning.: (Status: improved). Intentionally avoids activities,  places, people, or objects (e.g., up-armored vehicles) that evoke memories of the  event.:(Status: maintained). Intentionally avoids thoughts, feelings, or discussions related  to the traumatic event.: (Status: improved). Reports difficulty concentrating as  well as feelings of guilt.:  (Status: improved). Reports response of intense fear,  helplessness, or horror to the traumatic event.:  (Status: improved).  Problems Addressed: PTSD   Goals:  LTG:  1. Develop healthy thinking patterns and beliefs about self, others, and the world that lead to the alleviation and help prevent the relapse of  depression.  50% Objective Identify and replace thoughts and beliefs that support depression.  50 % Target Date: 2022-05-01 Frequency: Weekly Progress: 50 Modality: individual 2. Eliminate or reduce the negative impact trauma related symptoms have  on social, occupational, and family functioning.  50% Objective Learn and implement personal skills to manage challenging situations related to trauma. Target Date: 2022-05-01 Frequency: Weekly Progress: 30 Modality: individual3. No longer avoids persons, places, activities, and objects that are  reminiscent of the traumatic event. Objective 3.  Participate in Eye Movement Desensitization and Reprocessing (EMDR) to reduce emotional distress  related to traumatic thoughts, feelings, and images. Target Date: 2022-05-01 Frequency: Weekly Progress: 30 Modality: individual 4.  Learn and implement guided self-dialogue to manage thoughts, feelings, and urges brought on by  encounters with trauma-related situations. Target Date: 2022-05-01 Frequency: Weekly Progress: 40 Modality: individual 5.  No longer experiences intrusive event recollections, avoidance of event  reminders, intense arousal, or disinterest in activities or  relationships. Target Date:05/01/2022  Frequency : Weekly Progress 30    Modality: individual 6.Thinks about or openly discusses the traumatic event with others  without experiencing psychological or physiological  distress. Target Date : 05/01/2022 Frequency: Weekly Progress:30   Modality: Individual Interventions by Therapist:  CBT, problem solving therapy, EMDR, insight oriented.  Patient approved Treatment plan.  Jaymison Luber G Haley Fuerstenberg, LCSW  Jariah Jarmon G Sephira Zellman, LCSW

## 2021-12-20 ENCOUNTER — Ambulatory Visit (INDEPENDENT_AMBULATORY_CARE_PROVIDER_SITE_OTHER): Payer: Medicare Other | Admitting: Family Medicine

## 2021-12-20 VITALS — BP 122/78 | HR 73 | Ht 65.0 in | Wt 125.0 lb

## 2021-12-20 DIAGNOSIS — R413 Other amnesia: Secondary | ICD-10-CM

## 2021-12-20 NOTE — Progress Notes (Unsigned)
   I, Philbert Riser, LAT, ATC acting as a scribe for [name], MD.  Dawn Simmons is a 76 y.o. female who presents to [facility] today to review her neuropsych evaluation. Pt was last seen by Dr. Denyse Amass on 10/22/21 c/o cognitive impairment following COVID remains problematic despite receiving and continuing cognitive therapy and she was referred for a neuro psych evaluation. Pt has continued speech therapy, completing 10 visits and being d/c on 11/08/21. Pt is receiving weekly counseling for PTSD at [facility] [place]. Today, pt reports improvement in her back pain and has cont to do her HEP. Pt lost her train of thought in trying to gain her hx multiple times. Pt had tried to use nicotine patches and advised by her sister, which she felt was helpful, and then symptoms slowly began to return.   Dx imaging: 11/05/21 Head CT  01/10/20 Head CT  Pertinent review of systems: No fevers or chills  Relevant historical information: PTSD   Exam:  BP 122/78   Pulse 73   Ht 5\' 5"  (1.651 m)   Wt 125 lb (56.7 kg)   SpO2 98%   BMI 20.80 kg/m  General: Well Developed, well nourished, and in no acute distress.   Neuropsych: Alert and oriented.  Tangential during interview.   Reviewed the neuropsychological evaluation which will be available in the scanned documents in the media tab. The main points of the neuropsychological evaluation are significant impairment in her recall and working memory and significant psychological component.  Is not clear that she has an organic brain disease such as dementia.  However early stages of dementia are a possibility based on this report.  Recommendations are to repeat the neuropsychological evaluation in 18 months and she already has been referred to neurology.    Assessment and Plan: 76 y.o. female with impaired memory.  It is unclear the cause of her memory and concentration changes.  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  However is not clear exactly what is going on yet.  Neurology evaluation should be helpful. She asked about nicotine patches.  I think this is probably not the most evidence-based way to improve her cognition.  Aricept probably would be a better approach but she is intrigued by the nicotine patches.  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  From here she certainly could see me again but this is not a sports medicine issue.  She should follow-up with her primary care provider and neurology.  Happy to help further if needed.  Total encounter time 30 minutes including face-to-face time with the patient and, reviewing past medical record, and charting on the date of service.   Reviewed neuropsychological evaluation and talked about treatment plan options.   Discussed warning signs or symptoms. Please see discharge instructions. Patient expresses understanding.   The above documentation has been reviewed and is accurate and complete 61, M.D.

## 2021-12-20 NOTE — Patient Instructions (Signed)
Thank you for coming in today.   Follow up with Neurology.   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   We could try donazepril (Aricept).  I dont know about the nicotine patch but it does sound interesting.

## 2021-12-21 ENCOUNTER — Telehealth: Payer: Self-pay | Admitting: Family Medicine

## 2021-12-21 NOTE — Telephone Encounter (Signed)
Pt called [facility] but needs a referral in order to schedule. Can Dr. Denyse Amass refer?  Also, I noted that pt has Neuro Psych appts in March. I think she has already had this testing, can you ask her to cancel these if appropriate?

## 2021-12-21 NOTE — Telephone Encounter (Signed)
Called and spoke to pt, who wrote down the information provided. Pt informed a referral was placed to [facility]. Pt was provided the phone number and address for the office. Pt repeated this information back to me and seemed to have an understanding that she could call and schedule a visit w/ them.

## 2021-12-23 ENCOUNTER — Ambulatory Visit (INDEPENDENT_AMBULATORY_CARE_PROVIDER_SITE_OTHER): Payer: Medicare Other | Admitting: Psychology

## 2021-12-23 DIAGNOSIS — F4310 Post-traumatic stress disorder, unspecified: Secondary | ICD-10-CM | POA: Diagnosis not present

## 2021-12-23 NOTE — Progress Notes (Signed)
[facility] Counselor/Therapist Progress Note  Patient ID: Eutha Cude, MRN: [id number],    Date: 12/23/2021  Time Spent: 60 minutes  Treatment Type: Individual Therapy  Reported Symptoms: anxiety and depression and memory loss  Mental Status Exam: Appearance:  Casual     Behavior: Appropriate  Motor: Normal  Speech/Language:  Normal Rate  Affect: Blunt  Mood: anxious  Thought process: normal  Thought content:   WNL  Sensory/Perceptual disturbances:   WNL  Orientation: oriented to person, place, time/date, and situation  Attention: Good  Concentration: Good  Memory: WNL  Fund of knowledge:  Good  Insight:   Good  Judgment:  Good  Impulse Control: Good   Risk Assessment: Danger to Self:  No Self-injurious Behavior: No Danger to Others: No Duty to Warn:no Physical Aggression / Violence:No  Access to Firearms a concern: No  Gang Involvement:No   Subjective: The patient attended a face-to-face individual therapy session in the office today.  The patient presents with a blunted affect and mood is anxious.  The patient's husband came in with the patient during the first part of the session so that I could understand what the doctor's appointment was about on Monday of this week.  He stated that the doctor felt like she did not have Alzheimer's or dementia and that he wanted to refer her to a neurologist.  We talked about next steps and it seems that the neurologist will do some more testing and make sure that she is not having issues with her memory and we are treating the right thing by treating the PTSD.  I also explained to the patient and her husband what the process would be as far as referrals and doing the treatments that possibly could be recommended.  After the patient's husband left we talked about her being anxious because Ashley Mariner, her grandson is going to come and spend the weekend with them and he has not done this in more than 6 years.  She states that  she is anxious about wanting to make sure that he is not anxious himself.  We talked about some ideas that she can do with him over the weekend that would probably help him feel more comfortable.   Interventions: Cognitive Behavioral Therapy and Eye Movement Desensitization and Reprocessing (EMDR)  Diagnosis:PTSD (post-traumatic stress disorder)  Plan: Treatment Plan  Strengths/Abilities:Insightful, motivated, supportive husband  Treatment Preferences: Outpatient Individual therapy  Statement of Needs: "I have a problem passing out and I was told I have PTSD"    Symptoms:  Demonstrates an exaggerated startle response.:(Status: improved). Depressed  or irritable mood.: (Status: improved). Describes a reliving of the event,  particularly through dissociative flashbacks.:  (Status: improved). Displays a  significant decline in interest and engagement in activities.: (Status: improved). Displays significant psychological and/or physiological distress resulting from internal and external  clues that are reminiscent of the traumatic event.: (Status: improved).  Experiences disturbances in sleep.: (Status: improved). Experiences disturbing  and persistent thoughts, images, and/or perceptions of the traumatic event.: (Status: improved). Experiences frequent nightmares.: (Status: improved).  Feelings of hopelessness, worthlessness, or inappropriate guilt.: (Status:  improved). Has been exposed to a traumatic event involving actual or perceived threat of death or  serious injury.: (Status: maintained). Impairment in social, occupational, or  other areas of functioning.: (Status: improved). Intentionally avoids activities,  places, people, or objects (e.g., up-armored vehicles) that evoke memories of the event.:(Status: maintained). Intentionally avoids thoughts, feelings, or discussions related  to the traumatic event.: (  Status: improved). Reports difficulty concentrating as  well as feelings of  guilt.:  (Status: improved). Reports response of intense fear,  helplessness, or horror to the traumatic event.:  (Status: improved).  Problems Addressed: PTSD   Goals:  LTG:  1. Develop healthy thinking patterns and beliefs about self, others, and the world that lead to the alleviation and help prevent the relapse of  depression.  50% Objective Identify and replace thoughts and beliefs that support depression.  50 % Target Date: 2022-05-01 Frequency: Weekly Progress: 50 Modality: individual 2. Eliminate or reduce the negative impact trauma related symptoms have  on social, occupational, and family functioning.  50% Objective Learn and implement personal skills to manage challenging situations related to trauma. Target Date: 2022-05-01 Frequency: Weekly Progress: 30 Modality: individual3. No longer avoids persons, places, activities, and objects that are  reminiscent of the traumatic event. Objective 3.  Participate in Eye Movement Desensitization and Reprocessing (EMDR) to reduce emotional distress  related to traumatic thoughts, feelings, and images. Target Date: 2022-05-01 Frequency: Weekly Progress: 30 Modality: individual 4.  Learn and implement guided self-dialogue to manage thoughts, feelings, and urges brought on by  encounters with trauma-related situations. Target Date: 2022-05-01 Frequency: Weekly Progress: 40 Modality: individual 5.  No longer experiences intrusive event recollections, avoidance of event  reminders, intense arousal, or disinterest in activities or  relationships. Target Date:05/01/2022  Frequency : Weekly Progress 30    Modality: individual 6.Thinks about or openly discusses the traumatic event with others  without experiencing psychological or physiological distress. Target Date : 05/01/2022 Frequency: Weekly Progress:30   Modality: Individual Interventions by Therapist:  CBT, problem solving therapy, EMDR, insight oriented.  Patient approved  Treatment plan.  Domonic Kimball G Alyssha Housh, LCSW

## 2021-12-30 ENCOUNTER — Ambulatory Visit (INDEPENDENT_AMBULATORY_CARE_PROVIDER_SITE_OTHER): Payer: Medicare Other | Admitting: Psychology

## 2021-12-30 DIAGNOSIS — F4310 Post-traumatic stress disorder, unspecified: Secondary | ICD-10-CM

## 2021-12-31 NOTE — Progress Notes (Signed)
[facility] Counselor/Therapist Progress Note  Patient ID: Dawn Simmons, MRN: [id number],    Date: 12/30/2021  Time Spent: 60 minutes  Treatment Type: Individual Therapy  Reported Symptoms: anxiety and depression and memory loss  Mental Status Exam: Appearance:  Casual     Behavior: Appropriate  Motor: Normal  Speech/Language:  Normal Rate  Affect: Blunt  Mood: anxious  Thought process: normal  Thought content:   WNL  Sensory/Perceptual disturbances:   WNL  Orientation: oriented to person, place, time/date, and situation  Attention: Good  Concentration: Good  Memory: WNL  Fund of knowledge:  Good  Insight:   Good  Judgment:  Good  Impulse Control: Good   Risk Assessment: Danger to Self:  No Self-injurious Behavior: No Danger to Others: No Duty to Warn:no Physical Aggression / Violence:No  Access to Firearms a concern: No  Gang Involvement:No   Subjective: The patient attended a face-to-face individual therapy session in the office today.  The patient presents with a blunted affect and mood is anxious.  The patient brought and a copy of the report that Dr. Onnie Boer gave to them.  It appears that the patient did not do well on the test.  Dr. Izell High Amana felt that the patient did not seem to have Alzheimer's or dementia and the next step would be to refer her to a neurologist for further testing.  She did recommend that we might want to consider ketamine treatment or TMS if depressive symptoms continue to be significant.  I will reach out to the patient's medication provider and recommend that she evaluate her for the possibility of these 2 things.  The patient has an appointment in mid October with her neurologist and we will see if there is further testing indicated.  The patient talked about having her grandson for the weekend and that things went well.  The patient is doing much better than she has in the past and she is having more memories come back  to her.  She does have a significant amount of anxiety and insecurity about her own decision making.  We will continue to do therapy and teach her ways to manage her anxiety more.   Interventions: Cognitive Behavioral Therapy and Eye Movement Desensitization and Reprocessing (EMDR)  Diagnosis:PTSD (post-traumatic stress disorder)  Plan: Treatment Plan  Strengths/Abilities:Insightful, motivated, supportive husband  Treatment Preferences: Outpatient Individual therapy  Statement of Needs: "I have a problem passing out and I was told I have PTSD"    Symptoms:  Demonstrates an exaggerated startle response.:(Status: improved). Depressed  or irritable mood.: (Status: improved). Describes a reliving of the event,  particularly through dissociative flashbacks.:  (Status: improved). Displays a  significant decline in interest and engagement in activities.: (Status: improved). Displays significant psychological and/or physiological distress resulting from internal and external  clues that are reminiscent of the traumatic event.: (Status: improved).  Experiences disturbances in sleep.: (Status: improved). Experiences disturbing  and persistent thoughts, images, and/or perceptions of the traumatic event.: (Status: improved). Experiences frequent nightmares.: (Status: improved).  Feelings of hopelessness, worthlessness, or inappropriate guilt.: (Status:  improved). Has been exposed to a traumatic event involving actual or perceived threat of death or  serious injury.: (Status: maintained). Impairment in social, occupational, or  other areas of functioning.: (Status: improved). Intentionally avoids activities,  places, people, or objects (e.g., up-armored vehicles) that evoke memories of the event.:(Status: maintained). Intentionally avoids thoughts, feelings, or discussions related  to the traumatic event.: (Status: improved). Reports  difficulty concentrating as  well as feelings of guilt.:  (Status:  improved). Reports response of intense fear,  helplessness, or horror to the traumatic event.:  (Status: improved).  Problems Addressed: PTSD   Goals:  LTG:  1. Develop healthy thinking patterns and beliefs about self, others, and the world that lead to the alleviation and help prevent the relapse of  depression.  50% Objective Identify and replace thoughts and beliefs that support depression.  50 % Target Date: 2022-05-01 Frequency: Weekly Progress: 50 Modality: individual 2. Eliminate or reduce the negative impact trauma related symptoms have  on social, occupational, and family functioning.  50% Objective Learn and implement personal skills to manage challenging situations related to trauma. Target Date: 2022-05-01 Frequency: Weekly Progress: 30 Modality: individual3. No longer avoids persons, places, activities, and objects that are  reminiscent of the traumatic event. Objective 3.  Participate in Eye Movement Desensitization and Reprocessing (EMDR) to reduce emotional distress  related to traumatic thoughts, feelings, and images. Target Date: 2022-05-01 Frequency: Weekly Progress: 30 Modality: individual 4.  Learn and implement guided self-dialogue to manage thoughts, feelings, and urges brought on by  encounters with trauma-related situations. Target Date: 2022-05-01 Frequency: Weekly Progress: 40 Modality: individual 5.  No longer experiences intrusive event recollections, avoidance of event  reminders, intense arousal, or disinterest in activities or  relationships. Target Date:05/01/2022  Frequency : Weekly Progress 30    Modality: individual 6.Thinks about or openly discusses the traumatic event with others  without experiencing psychological or physiological distress. Target Date : 05/01/2022 Frequency: Weekly Progress:30   Modality: Individual Interventions by Therapist:  CBT, problem solving therapy, EMDR, insight oriented.  Patient approved Treatment plan.  Hosteen Kienast G  Kelley Polinsky, LCSW

## 2022-01-06 ENCOUNTER — Telehealth: Payer: Self-pay | Admitting: Psychiatry

## 2022-01-06 ENCOUNTER — Ambulatory Visit: Payer: Medicare Other | Admitting: Psychology

## 2022-01-06 ENCOUNTER — Ambulatory Visit (INDEPENDENT_AMBULATORY_CARE_PROVIDER_SITE_OTHER): Payer: Medicare Other | Admitting: Psychiatry

## 2022-01-06 VITALS — BP 113/67 | HR 70 | Ht 65.0 in | Wt 127.0 lb

## 2022-01-06 DIAGNOSIS — R413 Other amnesia: Secondary | ICD-10-CM

## 2022-01-06 DIAGNOSIS — R410 Disorientation, unspecified: Secondary | ICD-10-CM

## 2022-01-06 DIAGNOSIS — R55 Syncope and collapse: Secondary | ICD-10-CM | POA: Diagnosis not present

## 2022-01-06 NOTE — Progress Notes (Signed)
[facility]  PATIENT: Dawn Simmons DOB: 1945-10-04  REFERRING CLINICIAN: Gregor Hams, MD HISTORY FROM: self, husband REASON FOR VISIT: memory loss   HISTORICAL  CHIEF COMPLAINT:  Chief Complaint  Patient presents with   New Patient (Initial Visit)    Pt reports feeling not great. She states one day she passed out at home and got back up fine. She states she has been having passing out episodes. Room 1 with husband.    HISTORY OF PRESENT ILLNESS:  The patient presents for evaluation of memory loss which has been present since 23-Jul-2015.  She has had multiple episodes where she has passed out. Started passing out in Jul 23, 2014. Would pass out multiple times per day. Initially saw Cardiology and underwent a negative cardiac workup. Last episode of syncope was in 07-22-16.  Had an episode of amnesia at [place] a in July 23, 2015. Went to lunch with a friend, then put her head on the table and lost consciousness. Tried to pick her head up several times and was unable to. When she finally came to she did not know who she was, where she was, or where she lived. States she was a "blank slate" when she went home. Confusion improved somewhat and she began to remember who she was and who her family was. However she feels she has never fully gotten back to baseline since that episode. States she has still forgotten several old memories and had to relearn many things. Had to stop working.  She has persistent cognitive changes but does not feel they have progressed much over time. Continues to struggle with PTSD and depression. Mother of her grandchild recently overdosed and passed away in 07/23/2022 of this year.  Notes that she sees yellow lines whenever she looks at a white background. This is always present.  Had an EEG in 07-23-2015 which showed mild background slowing and was otherwise unremarkable. Brain MRI in 2015-07-23 showed age related white matter changes and was otherwise unremarkable.  She  underwent a neuropsych evaluation which showed a significant psychological component to impairment in her working memory and recall. An early neurodegenerative process could not be ruled out. She was recommended to undergo repeat neuropsychological testing in 18 months.  [facility] 11/05/21 showed mild cerebral volume loss and chronic small vessel ischemic disease. No acute process. Carotid US was negative for stenosis.  TBI:  No past history of TBI Stroke:  no past history of stroke Seizures:  no known history of seizures, multiple episodes of syncope Sleep: Takes valium to sleep Mood: History of PTSD on Prozac and Valium. She continues to have a depressed mood and frequently brings up her difficult childhood. States she has always had a difficult life. Left home at age 48.  Functional status:  Patient lives with her husband Cooking: Used to [employer] all the time, does not cook anymore.  Husband thinks she probably could start cooking again but seems too anxious to start Driving: She is not currently driving. Has not had issues getting to familiar places Bills: Used to manage finances but has not done as much recently Medications: Sometimes forgets to take her multivitamins Ever left the stove on by accident?: no Getting lost going to familiar places?: no Forgetting loved ones names?: yes, during episode of amnesia Word finding difficulty? yes  OTHER MEDICAL CONDITIONS: Von Willebrand disease, mitral valve prolapse, HLD, PTSD, migraines   REVIEW OF SYSTEMS: Full 14 system review of systems performed and negative with exception of: memory loss,  PTSD  ALLERGIES: Allergies  Allergen Reactions   Doxycycline Swelling   Tetanus Toxoids Other (See Comments)    PAIN IN NECK AND FACE   Flexeril [Cyclobenzaprine] Other (See Comments)    HOME MEDICATIONS: Outpatient Medications Prior to Visit  Medication Sig Dispense Refill   Ascorbic Acid (VITAMIN C) 100 MG tablet Take 100 mg by mouth daily.      Calcium-Magnesium 200-100 MG TABS Take 4 tablets by mouth daily. 2 tablets in the morning and 2 tablets in the evening     diazepam (VALIUM) 5 MG tablet Take 1 tablet (5 mg total) by mouth at bedtime. Take 1/2-1 tablet po QHS 90 tablet 0   FLUoxetine (PROZAC) 10 MG capsule Take 2 capsules (20 mg total) by mouth every morning. 180 capsule 0   MAGNESIUM PO Take 200 mg by mouth at bedtime.     Multiple Vitamin (MULTIVITAMIN) capsule Take 1 capsule by mouth daily.     Omega-3 1000 MG CAPS Take by mouth.     TURMERIC PO Take by mouth.     ergocalciferol (DRISDOL) 200 MCG/ML drops Take by mouth daily. PURE brand (Patient not taking: Reported on 01/06/2022)     loratadine (CLARITIN) 10 MG tablet Take 10 mg by mouth daily. (Patient not taking: Reported on 01/06/2022)     nystatin (MYCOSTATIN) 100000 UNIT/ML suspension Apply to corners of mouth three x daily (Patient not taking: Reported on 01/06/2022) 60 mL 1   triamcinolone cream (KENALOG) 0.1 % Apply 1 application topically 2 (two) times daily. (Patient not taking: Reported on 01/06/2022) 80 g 1   No facility-administered medications prior to visit.    PAST MEDICAL HISTORY: Past Medical History:  Diagnosis Date   H/O fibromyalgia    H/O insomnia    H/O measles    H/O rubella    H/O syncope    History of chicken pox 09/15/2019   History of palpitations    Hx of mumps    Hyperlipidemia    Mitral valve prolapse    Palpitations    Syncope    Von Willebrand disease (HCC)     PAST SURGICAL HISTORY: Past Surgical History:  Procedure Laterality Date   NO PAST SURGERIES      FAMILY HISTORY: Family History  Problem Relation Age of Onset   Stroke Mother    Emphysema Father        smoked   Alcohol abuse Father    Heart attack Maternal Grandfather    Heart attack Paternal Grandfather    Heart attack Maternal Uncle    Heart failure Maternal Grandmother    Heart failure Paternal Grandmother    OCD Son    Drug abuse Son    Seizures Neg Hx      SOCIAL HISTORY: Social History   Socioeconomic History   Marital status: Married    Spouse name: tony   Number of children: 4   Years of education: 12   Highest education level: Not on file  Occupational History   Not on file  Tobacco Use   Smoking status: Former    Packs/day: 0.50    Years: 10.00    Total pack years: 5.00    Types: Cigarettes    Quit date: 04/27/1972    Years since quitting: 49.7   Smokeless tobacco: Never  Vaping Use   Vaping Use: Never used  Substance and Sexual Activity   Alcohol use: Yes    Alcohol/week: 0.0 standard drinks of alcohol  Comment: occ 1 glass wine or beer   Drug use: No   Sexual activity: Not on file  Other Topics Concern   Not on file  Social History Narrative   Lives at home with husband   Caffeine use- tea, 1 cup/day   Social Determinants of Health   Financial Resource Strain: Low Risk  (12/13/2019)   Overall Financial Resource Strain (CARDIA)    Difficulty of Paying Living Expenses: Not hard at all  Food Insecurity: No Food Insecurity (12/13/2019)   Hunger Vital Sign    Worried About Running Out of Food in the Last Year: Never true    Ran Out of Food in the Last Year: Never true  Transportation Needs: No Transportation Needs (02/08/2021)   PRAPARE - Administrator, Civil Service (Medical): No    Lack of Transportation (Non-Medical): No  Physical Activity: Inactive (02/08/2021)   Exercise Vital Sign    Days of Exercise per Week: 0 days    Minutes of Exercise per Session: 0 min  Stress: Not on file  Social Connections: Not on file  Intimate Partner Violence: Not At Risk (02/08/2021)   Humiliation, Afraid, Rape, and Kick questionnaire    Fear of Current or Ex-Partner: No    Emotionally Abused: No    Physically Abused: No    Sexually Abused: No     PHYSICAL EXAM  GENERAL EXAM/CONSTITUTIONAL: Vitals:  Vitals:   01/06/22 1013  BP: 113/67  Pulse: 70  Weight: 127 lb (57.6 kg)  Height: 5\' 5"  (1.651 m)    Body mass index is 21.13 kg/m. Wt Readings from Last 3 Encounters:  01/06/22 127 lb (57.6 kg)  12/20/21 125 lb (56.7 kg)  12/06/21 121 lb 6.4 oz (55.1 kg)    NEUROLOGIC: MENTAL STATUS:     01/06/2022   10:19 AM  Montreal Cognitive Assessment   Visuospatial/ Executive (0/5) 5  Naming (0/3) 3  Attention: Read list of digits (0/2) 2  Attention: Read list of letters (0/1) 1  Attention: Serial 7 subtraction starting at 100 (0/3) 3  Language: Repeat phrase (0/2) 2  Language : Fluency (0/1) 1  Abstraction (0/2) 2  Delayed Recall (0/5) 0  Orientation (0/6) 4  Total 23  Adjusted Score (based on education) 23   Mildly disinhibited, preoccupied with difficult childhood,  difficult to redirect. Repeats herself frequently  CRANIAL NERVE:  2nd, 3rd, 4th, 6th - pupils equal and reactive to light, visual fields full to confrontation, extraocular muscles intact, no nystagmus 5th - facial sensation symmetric 7th - facial strength symmetric 8th - hearing intact 9th - palate elevates symmetrically, uvula midline 11th - shoulder shrug symmetric 12th - tongue protrusion midline  MOTOR:  normal bulk and tone, no cogwheeling, full strength in the BUE, BLE  SENSORY:  normal and symmetric to light touch all 4 extremities  COORDINATION:  finger-nose-finger, fine finger movements normal, fine postural tremor  REFLEXES:  deep tendon reflexes present and symmetric  GAIT/STATION:  normal     DIAGNOSTIC DATA (LABS, IMAGING, TESTING) - I reviewed patient records, labs, notes, testing and imaging myself where available.  Lab Results  Component Value Date   WBC 4.0 01/25/2021   HGB 12.9 01/25/2021   HCT 38.1 01/25/2021   MCV 93.4 01/25/2021   PLT 272.0 01/25/2021      Component Value Date/Time   NA 140 01/25/2021 1504   K 4.0 01/25/2021 1504   CL 106 01/25/2021 1504   CO2 25 01/25/2021 1504  GLUCOSE 88 01/25/2021 1504   BUN 17 01/25/2021 1504   CREATININE 0.88 01/25/2021  1504   CREATININE 0.97 (H) 01/10/2020 1358   CALCIUM 10.0 01/25/2021 1504   PROT 6.9 01/25/2021 1504   ALBUMIN 4.6 01/25/2021 1504   AST 23 01/25/2021 1504   ALT 15 01/25/2021 1504   ALKPHOS 67 01/25/2021 1504   BILITOT 0.6 01/25/2021 1504   Lab Results  Component Value Date   CHOL 227 (H) 11/03/2021   HDL 62.50 11/03/2021   LDLCALC 127 (H) 11/03/2021   TRIG 189.0 (H) 11/03/2021   CHOLHDL 4 11/03/2021   No results found for: "HGBA1C" No results found for: "VITAMINB12" Lab Results  Component Value Date   TSH 1.83 01/25/2021      ASSESSMENT AND PLAN  76 y.o. year old female with a history of Von Willebrand disease, mitral valve prolapse, HLD, PTSD, migraines who presents for evaluation of memory loss, confusion, and syncope. MOCA today is 23/30, which is most consistent with mild cognitive impairment. She does appear to have a strong psychological component and spends most of her visit today discussing the hardships in her life rather than her current memory issues. Loss of sense of self with her amnestic episode is more likely psychogenic rather than neurological. In terms of her current symptoms, ADLs are clearly affected, however it is difficult to differentiate how much of this is due to her cognitive issues or her underlying anxiety. Prior evaluation for similar symptoms in 2017 including MRI brain and EEG did not reveal an underlying neurological cause for her symptoms. Will repeat MRI and EEG to reassess for underlying neurological causes including neurodegenerative changes and seizures.    1. Memory loss   2. Syncope, unspecified syncope type   3. Confusion       PLAN: - Labs: TSH, B12 - MRI brain  - EEG - Follow up after testing is complete.   Orders Placed This Encounter  Procedures   MR BRAIN W WO CONTRAST   Vitamin B12   TSH   EEG adult    No orders of the defined types were placed in this encounter.   Return in about 6 months (around 07/07/2022).  I  spent an average of 65 minutes chart reviewing and counseling the patient, with at least 50% of the time face to face with the patient. General brain health measures discussed, including the importance of regular aerobic exercise.   Ocie Doyne, MD 01/06/22 11:26 AM  [facility] [address] [place], [place] [address] [phone]

## 2022-01-06 NOTE — Patient Instructions (Addendum)
Plan:  -Blood work: thyroid and B12 levels -Brain MRI -EEG -Consider taking magnesium oxide 500 mg daily to help with visual changes  Mild cognitive impairment  A person with mild cognitive impairment (MCI) experiences memory problems greater than normally expected with aging, but not serious enough to interfere with daily activities.  The patient with MCI complains of difficulty with memory. Typically, the complaints include trouble remembering the names of people they met recently, trouble remembering the flow of a conversation, and an increased tendency to misplace things, or similar problems. In many cases, the individual will be aware of these difficulties and will compensate with increased reliance on notes and calendars.   Although there is an increased chances of going on to develop dementia, it is not possible currently to predict with certainty which patients with MCI will or will not go on to develop dementia. There is currently no specific treatment for MCI. People leading sedentary lifestyles are at greater risk for developing dementia. Increased physical activity and brain exercise can help with maintaining brain function.  Tasks to improve attention/working memory 1. Good sleep hygiene (7-8 hrs of sleep) 2. Learning a new skill (Painting, Carpentry, Pottery, new language, Knitting). 3.Cognitive exercises (keep a daily journal, Puzzles) 4. Physical exercise and training  (30 min/day X 4 days week) 5. Being on Antidepressant if needed 6.Yoga, Meditation, Tai Chi 7. Decrease alcohol intake 8.Have a clear schedule and structure in daily routine  MIND Diet: The [place] Diet Intervention for Neurodegenerative Delay, or MIND diet, targets the health of the aging brain. Research participants with the highest MIND diet scores had a significantly slower rate of cognitive decline compared with those with the lowest scores. The effects of the MIND diet on cognition showed  greater effects than either the Mediterranean or the DASH diet alone.  The healthy items the MIND diet guidelines suggest include:  3+ servings a day of whole grains 1+ servings a day of vegetables (other than green leafy) 6+ servings a week of green leafy vegetables 5+ servings a week of nuts 4+ meals a week of beans 2+ servings a week of berries 2+ meals a week of poultry 1+ meals a week of fish Mainly olive oil if added fat is used  The unhealthy items, which are higher in saturated and trans fat, include: Less than 5 servings a week of pastries and sweets Less than 4 servings a week of red meat (including beef, pork, lamb, and products made from these meats) Less than one serving a week of cheese and fried foods Less than 1 tablespoon a day of butter/stick margarine

## 2022-01-06 NOTE — Telephone Encounter (Signed)
medicare NPR sent to GI  

## 2022-01-07 LAB — TSH: TSH: 2.45 u[IU]/mL (ref 0.450–4.500)

## 2022-01-07 LAB — VITAMIN B12: Vitamin B-12: 989 pg/mL (ref 232–1245)

## 2022-01-10 ENCOUNTER — Ambulatory Visit: Payer: Medicare Other | Admitting: Psychiatry

## 2022-01-11 ENCOUNTER — Ambulatory Visit (INDEPENDENT_AMBULATORY_CARE_PROVIDER_SITE_OTHER): Payer: Medicare Other | Admitting: Neurology

## 2022-01-11 DIAGNOSIS — R55 Syncope and collapse: Secondary | ICD-10-CM

## 2022-01-11 DIAGNOSIS — R410 Disorientation, unspecified: Secondary | ICD-10-CM

## 2022-01-11 NOTE — Procedures (Signed)
    History:  76 year old woman with memory loss   EEG classification: Awake and drowsy  Description of the recording: The background rhythms of this recording consists of a fairly well modulated medium amplitude alpha rhythm of 11-12 Hz that is reactive to eye opening and closure. As the record progresses, the patient appears to remain in the waking state throughout the recording. Photic stimulation was performed, did not show any abnormalities. Hyperventilation was also performed, did not show any abnormalities. Toward the end of the recording, the patient enters the drowsy state with slight symmetric slowing seen. The patient never enters stage II sleep. No abnormal epileptiform discharges seen during this recording. There was no focal slowing. EKG monitor shows no evidence of cardiac rhythm abnormalities with a heart rate of 78.  Abnormality: None   Impression: This is a normal EEG recording in the waking and drowsy state. No evidence of interictal epileptiform discharges seen. A normal EEG does not exclude a diagnosis of epilepsy.    Alric Ran, MD [facility]

## 2022-01-13 ENCOUNTER — Ambulatory Visit: Payer: Medicare Other | Admitting: Psychology

## 2022-01-20 ENCOUNTER — Ambulatory Visit (INDEPENDENT_AMBULATORY_CARE_PROVIDER_SITE_OTHER): Payer: Medicare Other | Admitting: Psychology

## 2022-01-20 DIAGNOSIS — F4310 Post-traumatic stress disorder, unspecified: Secondary | ICD-10-CM | POA: Diagnosis not present

## 2022-01-20 NOTE — Progress Notes (Signed)
[facility] Counselor/Therapist Progress Note  Patient ID: Dawn Simmons, MRN: [id number],    Date: 01/20/2022  Time Spent: 60 minutes  Treatment Type: Individual Therapy  Reported Symptoms: anxiety and depression and memory loss  Mental Status Exam: Appearance:  Casual     Behavior: Appropriate  Motor: Normal  Speech/Language:  Normal Rate  Affect: Blunt  Mood: pleasant  Thought process: normal  Thought content:   WNL  Sensory/Perceptual disturbances:   WNL  Orientation: oriented to person, place, time/date, and situation  Attention: Good  Concentration: Good  Memory: WNL  Fund of knowledge:  Good  Insight:   Good  Judgment:  Good  Impulse Control: Good   Risk Assessment: Danger to Self:  No Self-injurious Behavior: No Danger to Others: No Duty to Warn:no Physical Aggression / Violence:No  Access to Firearms a concern: No  Gang Involvement:No   Subjective: The patient attended a face-to-face individual therapy session in the office today.  The patient presents with a blunted affect and mood is pleasant.  The patient reports that she went for any EEG and that went well they did not find any abnormalities.  The patient reports that she has to go for an MRI and hopefully that will help clarify what is going on with her.  I explained to her that being able to rule out that it is nothing that is really a problem and that it is just post PTSD is a good thing.  The patient tends to go into black and white thinking and has trouble seeing anything in the middle.  We talked about her wanting to get "better".  I worked with her today to try to define what better looks like.  She was unable to really say what she would like to happen.  She says that she would like to be like she used to be.  I explained to her that she is gradually making progress in that direction and she said states that she would like for it to just happen.  We talked about all or nothing thinking  again and discussed the need for her to recognize that she has made some headway in regard to her memory and her growth.  She did mention that she has not been doing what she needs to do such as dancing and singing and that she is listening to a good bit of news.  I explained to her that watching the news on a regular basis is not necessarily a good thing for people who have some anxiety.  We talked about between now and our next session her trying to do dance at least 2-3 times a week and to decrease the amount of news that she is watching.   Interventions: Cognitive Behavioral Therapy and Eye Movement Desensitization and Reprocessing (EMDR)  Diagnosis:PTSD (post-traumatic stress disorder)  Plan: Treatment Plan  Strengths/Abilities:Insightful, motivated, supportive husband  Treatment Preferences: Outpatient Individual therapy  Statement of Needs: "I have a problem passing out and I was told I have PTSD"    Symptoms:  Demonstrates an exaggerated startle response.:(Status: improved). Depressed  or irritable mood.: (Status: improved). Describes a reliving of the event,  particularly through dissociative flashbacks.:  (Status: improved). Displays a  significant decline in interest and engagement in activities.: (Status: improved). Displays significant psychological and/or physiological distress resulting from internal and external  clues that are reminiscent of the traumatic event.: (Status: improved).  Experiences disturbances in sleep.: (Status: improved). Experiences disturbing  and persistent  thoughts, images, and/or perceptions of the traumatic event.: (Status: improved). Experiences frequent nightmares.: (Status: improved).  Feelings of hopelessness, worthlessness, or inappropriate guilt.: (Status:  improved). Has been exposed to a traumatic event involving actual or perceived threat of death or  serious injury.: (Status: maintained). Impairment in social, occupational, or  other areas  of functioning.: (Status: improved). Intentionally avoids activities,  places, people, or objects (e.g., up-armored vehicles) that evoke memories of the event.:(Status: maintained). Intentionally avoids thoughts, feelings, or discussions related  to the traumatic event.: (Status: improved). Reports difficulty concentrating as  well as feelings of guilt.:  (Status: improved). Reports response of intense fear,  helplessness, or horror to the traumatic event.:  (Status: improved).  Problems Addressed: PTSD   Goals:  LTG:  1. Develop healthy thinking patterns and beliefs about self, others, and the world that lead to the alleviation and help prevent the relapse of  depression.  50% Objective Identify and replace thoughts and beliefs that support depression.  50 % Target Date: 2022-05-01 Frequency: Weekly Progress: 50 Modality: individual 2. Eliminate or reduce the negative impact trauma related symptoms have  on social, occupational, and family functioning.  50% Objective Learn and implement personal skills to manage challenging situations related to trauma. Target Date: 2022-05-01 Frequency: Weekly Progress: 30 Modality: individual3. No longer avoids persons, places, activities, and objects that are  reminiscent of the traumatic event. Objective 3.  Participate in Eye Movement Desensitization and Reprocessing (EMDR) to reduce emotional distress  related to traumatic thoughts, feelings, and images. Target Date: 2022-05-01 Frequency: Weekly Progress: 30 Modality: individual 4.  Learn and implement guided self-dialogue to manage thoughts, feelings, and urges brought on by  encounters with trauma-related situations. Target Date: 2022-05-01 Frequency: Weekly Progress: 40 Modality: individual 5.  No longer experiences intrusive event recollections, avoidance of event  reminders, intense arousal, or disinterest in activities or  relationships. Target Date:05/01/2022  Frequency :  Weekly Progress 30    Modality: individual 6.Thinks about or openly discusses the traumatic event with others  without experiencing psychological or physiological distress. Target Date : 05/01/2022 Frequency: Weekly Progress:30   Modality: Individual Interventions by Therapist:  CBT, problem solving therapy, EMDR, insight oriented.  Patient approved Treatment plan.  Callan Norden G Octavis Sheeler, LCSW

## 2022-01-24 ENCOUNTER — Encounter: Payer: Self-pay | Admitting: Psychiatry

## 2022-01-24 ENCOUNTER — Ambulatory Visit (INDEPENDENT_AMBULATORY_CARE_PROVIDER_SITE_OTHER): Payer: Medicare Other | Admitting: Psychiatry

## 2022-01-24 DIAGNOSIS — F4310 Post-traumatic stress disorder, unspecified: Secondary | ICD-10-CM | POA: Diagnosis not present

## 2022-01-24 DIAGNOSIS — F32A Depression, unspecified: Secondary | ICD-10-CM

## 2022-01-24 MED ORDER — ESCITALOPRAM OXALATE 10 MG PO TABS: ORAL_TABLET | ORAL | 1 refills | Status: DC

## 2022-01-24 NOTE — Progress Notes (Signed)
Dawn Simmons [id number] June 11, 1945 75 y.o.  Subjective:   Patient ID:  Dawn Simmons is a 76 y.o. (DOB 06/30/1945) female.  Chief Complaint:  Chief Complaint  Patient presents with   Depression   Anxiety   Memory Loss    HPI Dawn Simmons presents to the office today for follow-up of anxiety, depression, and insomnia. She reports, "nothing's really changing." She is not sure if she is having intrusive memories from past traumatic memories. Denies re-experiencing traumatic experiences or nightmares. She reports having "epic dreams."   She reports that she has some occasional depression. Mood is lower on over cast days. She reports, "nothings's really fun anymore." Energy and motivation are "not good." She reports that she is not keeping up with household chores, like dusting, like she has in the past. Sleeping well. Denies SI- "no, but I pray everyday that the rapture will happen."   She reports that she has difficulty with her memory since she passed out in 2017. She has had recent neuropsychological testing. She was also seen by Dawn Harold, MD at [facility] who ordered labs, and EEG, and brain MRI. Labs and EEG were WNL. She reports that she completed nicotine patches to possibly help cognition.   "Life sucks for me right now."   Dawn Simmons has returned to live with son and his family. She has been able to see grandson several times. "I have no control anymore... I had to have control all these years because no one else did."   She reports a friend recommended she asks about Trazodone.   "I don't like medicine."  Diazepam last filled 09/22/21.   Past Psychiatric Medication Trials: (She reports that she is very sensitive to medication) Diazepam- Prescribed as need with limited improvement.  Prozac- reports that she has to take Prozac 20 mg in divided doses.       [place] Office Visit from 12/06/2021 in [facility] at [place] Visit from 10/19/2021 in [facility] at [place] Visit from 09/07/2021 in [facility] at [place] from 02/08/2021 in [place] at [place] Visit from 01/25/2021 in [place] at [place]  PHQ-2 Total Score 0 2 2 1 2   PHQ-9 Total Score 0 13 9 -- 7        Review of Systems:  Review of Systems  Gastrointestinal:        Some stomach discomfort  Musculoskeletal:  Negative for gait problem.  Neurological:  Negative for tremors.  Psychiatric/Behavioral:         Please refer to HPI    Medications: I have reviewed the patient's current medications.  Current Outpatient Medications  Medication Sig Dispense Refill   escitalopram (LEXAPRO) 10 MG tablet Take 1/2 tablet daily for one week, then increase to 1 tablet daily 30 tablet 1   Ascorbic Acid (VITAMIN C) 100 MG tablet Take 100 mg by mouth daily.     Calcium-Magnesium 200-100 MG TABS Take 4 tablets by mouth daily. 2 tablets in the morning and 2 tablets in the evening     diazepam (VALIUM) 5 MG tablet Take 1 tablet (5 mg total) by mouth at bedtime. Take 1/2-1 tablet po QHS 90 tablet 0   ergocalciferol (DRISDOL) 200 MCG/ML drops Take by mouth daily. PURE brand (Patient not taking: Reported on 01/06/2022)     loratadine (CLARITIN)  10 MG tablet Take 10 mg by mouth daily. (Patient not taking: Reported on 01/06/2022)     MAGNESIUM PO Take 200 mg by mouth at bedtime.     Multiple Vitamin (MULTIVITAMIN) capsule Take 1 capsule by mouth daily.     nystatin (MYCOSTATIN) 100000 UNIT/ML suspension Apply to corners of mouth three x daily (Patient not taking: Reported on 01/06/2022) 60 mL 1   Omega-3 1000 MG CAPS Take by mouth.     triamcinolone cream (KENALOG) 0.1 % Apply 1 application topically 2 (two) times daily. (Patient not taking: Reported on 01/06/2022) 80 g 1   TURMERIC PO Take by  mouth.     No current facility-administered medications for this visit.    Medication Side Effects: Other: Stomach upset  Allergies:  Allergies  Allergen Reactions   Doxycycline Swelling   Tetanus Toxoids Other (See Comments)    PAIN IN NECK AND FACE   Flexeril [Cyclobenzaprine] Other (See Comments)    Past Medical History:  Diagnosis Date   H/O fibromyalgia    H/O insomnia    H/O measles    H/O rubella    H/O syncope    History of chicken pox 09/15/2019   History of palpitations    Hx of mumps    Hyperlipidemia    Mitral valve prolapse    Palpitations    Syncope    Von Willebrand disease (HCC)     Past Medical History, Surgical history, Social history, and Family history were reviewed and updated as appropriate.   Please see review of systems for further details on the patient's review from today.   Objective:   Physical Exam:  There were no vitals taken for this visit.  Physical Exam Constitutional:      General: She is not in acute distress. Musculoskeletal:        General: No deformity.  Neurological:     Mental Status: She is alert and oriented to person, place, and time.     Coordination: Coordination normal.  Psychiatric:        Attention and Perception: Attention and perception normal. She does not perceive auditory or visual hallucinations.        Mood and Affect: Mood is anxious and depressed. Affect is not labile, blunt, angry or inappropriate.        Speech: Speech normal.        Behavior: Behavior normal.        Thought Content: Thought content normal. Thought content is not paranoid or delusional. Thought content does not include homicidal or suicidal ideation. Thought content does not include homicidal or suicidal plan.        Cognition and Memory: She exhibits impaired recent memory and impaired remote memory.     Comments: Insight intact Judgment was fair     Lab Review:     Component Value Date/Time   NA 140 01/25/2021 1504   K 4.0  01/25/2021 1504   CL 106 01/25/2021 1504   CO2 25 01/25/2021 1504   GLUCOSE 88 01/25/2021 1504   BUN 17 01/25/2021 1504   CREATININE 0.88 01/25/2021 1504   CREATININE 0.97 (H) 01/10/2020 1358   CALCIUM 10.0 01/25/2021 1504   PROT 6.9 01/25/2021 1504   ALBUMIN 4.6 01/25/2021 1504   AST 23 01/25/2021 1504   ALT 15 01/25/2021 1504   ALKPHOS 67 01/25/2021 1504   BILITOT 0.6 01/25/2021 1504       Component Value Date/Time   WBC 4.0 01/25/2021 1504  RBC 4.08 01/25/2021 1504   HGB 12.9 01/25/2021 1504   HCT 38.1 01/25/2021 1504   PLT 272.0 01/25/2021 1504   MCV 93.4 01/25/2021 1504   MCH 31.3 01/10/2020 1358   MCHC 33.9 01/25/2021 1504   RDW 13.1 01/25/2021 1504   LYMPHSABS 1,197 01/10/2020 1358   MONOABS 0.5 10/31/2019 1509   EOSABS 198 01/10/2020 1358   BASOSABS 32 01/10/2020 1358    No results found for: "POCLITH", "LITHIUM"   No results found for: "PHENYTOIN", "PHENOBARB", "VALPROATE", "CBMZ"   .res Assessment: Plan:   Pt seen for 30 minutes and time spent discussing medical work up for cognitive changes and that recommendations include optimizing treatment for depression and anxiety. Discussed considering increasing Prozac to 30 mg daily since she questions if Prozac 20 mg daily has been effective. She reports that she does not want to increase Prozac since she has GI upset with current dose and has to eat, take 10 mg capsule, eat, wait and take and additional 10 mg capsule. Discussed considering starting a different medication if she does not want to increase Prozac and is not experiencing significant benefit with current dose. Discussed potential benefits, risks, and side effects of Lexapro. Pt agrees to trial of Lexapro. Discussed starting Lexapro 10 mg 1/2 tablet daily for one week, then increasing to one 10 mg tablet daily.  Discussed that she could stop taking Prozac when starting Lexapro.  Recommend continuing to take Diazepam 5 mg po QHS for insomnia and anxiety.  Pt  to follow-up with this provider in 4 weeks or sooner if clinically indicated.  Recommend continuing therapy with Bambi Cottle, LCSW.  Patient advised to contact office with any questions, adverse effects, or acute worsening in signs and symptoms.   Kaysi was seen today for depression, anxiety and memory loss.  Diagnoses and all orders for this visit:  PTSD (post-traumatic stress disorder) -     escitalopram (LEXAPRO) 10 MG tablet; Take 1/2 tablet daily for one week, then increase to 1 tablet daily  Depression, unspecified depression type -     escitalopram (LEXAPRO) 10 MG tablet; Take 1/2 tablet daily for one week, then increase to 1 tablet daily     Please see After Visit Summary for patient specific instructions.  Future Appointments  Date Time Provider Department Center  01/27/2022 10:40 AM Bradd Canary, MD [facility]  01/27/2022  1:00 PM Cottle, Bambi G, LCSW [facility] None  01/31/2022 11:40 AM [facility] MR 3 [facility]315[facility] [address] [facility]  02/03/2022  1:00 PM Cottle, Bambi G, LCSW [facility] None  02/10/2022  1:00 PM Cottle, Bambi G, LCSW [facility] None  02/17/2022  1:00 PM Cottle, Bambi G, LCSW [facility] None  02/24/2022  1:00 PM Cottle, Bambi G, LCSW [facility] None  02/28/2022  2:15 PM Corie Chiquito, PMHNP [facility] None  03/10/2022  1:00 PM Cottle, Bambi G, LCSW [facility] None  03/17/2022  1:00 PM Cottle, Bambi G, LCSW [facility] None  03/24/2022  1:00 PM Cottle, Bambi G, LCSW [facility] None  03/31/2022  1:00 PM Cottle, Bambi G, LCSW [facility] None  04/07/2022  1:00 PM Cottle, Bambi G, LCSW [facility] None  07/06/2022  8:30 AM Rosann Auerbach, PhD [facility] None  07/06/2022  9:30 AM [facility]- NEUROPSYCH TECH [facility] None  07/12/2022  1:15 PM Glean Salvo, NP [facility] None  07/18/2022  2:30 PM Rosann Auerbach, PhD [facility] None    No orders of the defined types were placed in this encounter.   -------------------------------

## 2022-01-24 NOTE — Patient Instructions (Signed)
Take 1/2 tablet of an Escitalopram  (Lexapro) 10 mg tablet for one week, then increase to 1 tablet daily.  Stop Fluoxetine.   Continue Diazepam.

## 2022-01-25 ENCOUNTER — Ambulatory Visit: Payer: Medicare Other | Admitting: Psychiatry

## 2022-01-26 NOTE — Assessment & Plan Note (Signed)
Encouraged good sleep hygiene such as dark, quiet room. No blue/green glowing lights such as computer screens in bedroom. No alcohol or stimulants in evening. Cut down on caffeine as able. Regular exercise is helpful but not just prior to bed time.  

## 2022-01-26 NOTE — Assessment & Plan Note (Signed)
Encouraged moist heat and gentle stretching as tolerated. May try NSAIDs and prescription meds as directed and report if symptoms worsen or seek immediate care 

## 2022-01-26 NOTE — Assessment & Plan Note (Signed)
Encourage heart healthy diet such as MIND or DASH diet, increase exercise, avoid trans fats, simple carbohydrates and processed foods, consider a krill or fish or flaxseed oil cap daily.  °

## 2022-01-27 ENCOUNTER — Ambulatory Visit (INDEPENDENT_AMBULATORY_CARE_PROVIDER_SITE_OTHER): Payer: Medicare Other | Admitting: Psychology

## 2022-01-27 ENCOUNTER — Ambulatory Visit (INDEPENDENT_AMBULATORY_CARE_PROVIDER_SITE_OTHER): Payer: Medicare Other | Admitting: Family Medicine

## 2022-01-27 DIAGNOSIS — F4310 Post-traumatic stress disorder, unspecified: Secondary | ICD-10-CM | POA: Diagnosis not present

## 2022-01-27 DIAGNOSIS — M545 Low back pain, unspecified: Secondary | ICD-10-CM

## 2022-01-27 DIAGNOSIS — G4700 Insomnia, unspecified: Secondary | ICD-10-CM

## 2022-01-27 DIAGNOSIS — E785 Hyperlipidemia, unspecified: Secondary | ICD-10-CM | POA: Diagnosis not present

## 2022-01-27 NOTE — Progress Notes (Signed)
Subjective:   By signing my name below, I, Kellie Simmering, attest that this documentation has been prepared under the direction and in the presence of Penni Homans A, MD.,01/27/2022.     Patient ID: Dawn Simmons, female    DOB: 10/05/1945, 76 y.o.   MRN: [id number]  Chief Complaint  Patient presents with   Follow-up    Here for follow up   HPI Patient is in today for an office visit. Patient's husband is with her today and also speaking on her behalf.  Anxiety/Depression: She is currently taking 0.5 tablets of Diazepam 5 mg nightly to manage her anxiety. She is also taking 0.5 tablets of Escitalopram 10 mg daily, but will increase to 1 tablet daily.  Arthritis: She is inquiring about the state of her arthritis. She says that she has been doing activities at home to keep her joints mobile.  Immunizations: She has been informed about receiving COVID-19, high-dose Flu, and RSV immunizations.   Neurology: She is currently seeing her neurologist, Dr. Genia Harold, and has an MRI of the brain scheduled for 01/31/2022.  Past Medical History:  Diagnosis Date   H/O fibromyalgia    H/O insomnia    H/O measles    H/O rubella    H/O syncope    History of chicken pox 09/15/2019   History of palpitations    Hx of mumps    Hyperlipidemia    Mitral valve prolapse    Palpitations    Syncope    Von Willebrand disease ([place] [facility])    Past Surgical History:  Procedure Laterality Date   NO PAST SURGERIES     Family History  Problem Relation Age of Onset   Stroke Mother    Emphysema Father        smoked   Alcohol abuse Father    Heart attack Maternal Grandfather    Heart attack Paternal Grandfather    Heart attack Maternal Uncle    Heart failure Maternal Grandmother    Heart failure Paternal Grandmother    OCD Son    Drug abuse Son    Seizures Neg Hx    Social History   Socioeconomic History   Marital status: Married    Spouse name: tony   Number of children: 4   Years of  education: 12   Highest education level: Not on file  Occupational History   Not on file  Tobacco Use   Smoking status: Former    Packs/day: 0.50    Years: 10.00    Total pack years: 5.00    Types: Cigarettes    Quit date: 04/27/1972    Years since quitting: 49.7   Smokeless tobacco: Never  Vaping Use   Vaping Use: Never used  Substance and Sexual Activity   Alcohol use: Yes    Alcohol/week: 0.0 standard drinks of alcohol    Comment: occ 1 glass wine or beer   Drug use: No   Sexual activity: Not on file  Other Topics Concern   Not on file  Social History Narrative   Lives at home with husband   Caffeine use- tea, 1 cup/day   Social Determinants of Health   Financial Resource Strain: Low Risk  (12/13/2019)   Overall Financial Resource Strain (CARDIA)    Difficulty of Paying Living Expenses: Not hard at all  Food Insecurity: No Food Insecurity (12/13/2019)   Hunger Vital Sign    Worried About Running Out of Food in the Last Year: Never true  Ran Out of Food in the Last Year: Never true  Transportation Needs: No Transportation Needs (02/08/2021)   PRAPARE - Hydrologist (Medical): No    Lack of Transportation (Non-Medical): No  Physical Activity: Inactive (02/08/2021)   Exercise Vital Sign    Days of Exercise per Week: 0 days    Minutes of Exercise per Session: 0 min  Stress: Not on file  Social Connections: Not on file  Intimate Partner Violence: Not At Risk (02/08/2021)   Humiliation, Afraid, Rape, and Kick questionnaire    Fear of Current or Ex-Partner: No    Emotionally Abused: No    Physically Abused: No    Sexually Abused: No   Outpatient Medications Prior to Visit  Medication Sig Dispense Refill   Ascorbic Acid (VITAMIN C) 100 MG tablet Take 100 mg by mouth daily.     Calcium-Magnesium 200-100 MG TABS Take 4 tablets by mouth daily. 2 tablets in the morning and 2 tablets in the evening     diazepam (VALIUM) 5 MG tablet Take 1 tablet  (5 mg total) by mouth at bedtime. Take 1/2-1 tablet po QHS 90 tablet 0   ergocalciferol (DRISDOL) 200 MCG/ML drops      escitalopram (LEXAPRO) 10 MG tablet Take 1/2 tablet daily for one week, then increase to 1 tablet daily 30 tablet 1   loratadine (CLARITIN) 10 MG tablet Take 10 mg by mouth daily.     MAGNESIUM PO Take 200 mg by mouth at bedtime.     Multiple Vitamin (MULTIVITAMIN) capsule Take 1 capsule by mouth daily.     nystatin (MYCOSTATIN) 100000 UNIT/ML suspension Apply to corners of mouth three x daily 60 mL 1   Omega-3 1000 MG CAPS Take by mouth.     triamcinolone cream (KENALOG) 0.1 % Apply 1 application topically 2 (two) times daily. 80 g 1   TURMERIC PO Take by mouth.     No facility-administered medications prior to visit.   Allergies  Allergen Reactions   Doxycycline Swelling   Tetanus Toxoids Other (See Comments)    PAIN IN NECK AND FACE   Flexeril [Cyclobenzaprine] Other (See Comments)   ROS    Objective:    Physical Exam Constitutional:      General: She is not in acute distress.    Appearance: Normal appearance. She is not ill-appearing.  HENT:     Head: Normocephalic and atraumatic.     Right Ear: External ear normal.     Left Ear: External ear normal.     Mouth/Throat:     Mouth: Mucous membranes are moist.     Pharynx: Oropharynx is clear.  Eyes:     Extraocular Movements: Extraocular movements intact.     Pupils: Pupils are equal, round, and reactive to light.  Cardiovascular:     Rate and Rhythm: Normal rate and regular rhythm.     Pulses: Normal pulses.     Heart sounds: Normal heart sounds. No murmur heard.    No gallop.  Pulmonary:     Effort: Pulmonary effort is normal. No respiratory distress.     Breath sounds: Normal breath sounds. No wheezing or rales.  Abdominal:     General: Bowel sounds are normal.  Skin:    General: Skin is warm and dry.  Neurological:     Mental Status: She is alert and oriented to person, place, and time.   Psychiatric:        Mood and Affect:  Mood normal.        Behavior: Behavior normal.        Judgment: Judgment normal.    BP (!) 112/57 (BP Location: Right Arm, Patient Position: Sitting, Cuff Size: Normal)   Pulse 73   Temp (!) 97 F (36.1 C) (Oral)   Resp 16   Ht 5\' 5"  (1.651 m)   Wt 125 lb 9.6 oz (57 kg)   SpO2 98%   BMI 20.90 kg/m  Wt Readings from Last 3 Encounters:  01/27/22 125 lb 9.6 oz (57 kg)  01/06/22 127 lb (57.6 kg)  12/20/21 125 lb (56.7 kg)   Diabetic Foot Exam - Simple   No data filed    Lab Results  Component Value Date   WBC 4.0 01/25/2021   HGB 12.9 01/25/2021   HCT 38.1 01/25/2021   PLT 272.0 01/25/2021   GLUCOSE 88 01/25/2021   CHOL 227 (H) 11/03/2021   TRIG 189.0 (H) 11/03/2021   HDL 62.50 11/03/2021   LDLCALC 127 (H) 11/03/2021   ALT 15 01/25/2021   AST 23 01/25/2021   NA 140 01/25/2021   K 4.0 01/25/2021   CL 106 01/25/2021   CREATININE 0.88 01/25/2021   BUN 17 01/25/2021   CO2 25 01/25/2021   TSH 2.450 01/06/2022   Lab Results  Component Value Date   TSH 2.450 01/06/2022   Lab Results  Component Value Date   WBC 4.0 01/25/2021   HGB 12.9 01/25/2021   HCT 38.1 01/25/2021   MCV 93.4 01/25/2021   PLT 272.0 01/25/2021   Lab Results  Component Value Date   NA 140 01/25/2021   K 4.0 01/25/2021   CO2 25 01/25/2021   GLUCOSE 88 01/25/2021   BUN 17 01/25/2021   CREATININE 0.88 01/25/2021   BILITOT 0.6 01/25/2021   ALKPHOS 67 01/25/2021   AST 23 01/25/2021   ALT 15 01/25/2021   PROT 6.9 01/25/2021   ALBUMIN 4.6 01/25/2021   CALCIUM 10.0 01/25/2021   GFR 64.86 01/25/2021   Lab Results  Component Value Date   CHOL 227 (H) 11/03/2021   Lab Results  Component Value Date   HDL 62.50 11/03/2021   Lab Results  Component Value Date   LDLCALC 127 (H) 11/03/2021   Lab Results  Component Value Date   TRIG 189.0 (H) 11/03/2021   Lab Results  Component Value Date   CHOLHDL 4 11/03/2021   No results found for:  "HGBA1C"     Assessment & Plan:   Problem List Items Addressed This Visit     Insomnia    Encouraged good sleep hygiene such as dark, quiet room. No blue/green glowing lights such as computer screens in bedroom. No alcohol or stimulants in evening. Cut down on caffeine as able. Regular exercise is helpful but not just prior to bed time.       PTSD (post-traumatic stress disorder)    Is considering a course of Ketamine and magnets due to her persistent symptoms. She continues counseling as well. Discuss current state and plan of care for 30 minutes.      Low back pain    Encouraged moist heat and gentle stretching as tolerated. May try NSAIDs and prescription meds as directed and report if symptoms worsen or seek immediate care      Hyperlipidemia    Encourage heart healthy diet such as MIND or DASH diet, increase exercise, avoid trans fats, simple carbohydrates and processed foods, consider a krill or fish or flaxseed oil  cap daily.       No orders of the defined types were placed in this encounter.  I, Penni Homans, MD, personally preformed the services described in this documentation.  All medical record entries made by the scribe were at my direction and in my presence.  I have reviewed the chart and discharge instructions (if applicable) and agree that the record reflects my personal performance and is accurate and complete. 01/27/2022  I,Mohammed Iqbal,acting as a scribe for Penni Homans, MD.,have documented all relevant documentation on the behalf of Penni Homans, MD,as directed by  Penni Homans, MD while in the presence of Penni Homans, MD.  Penni Homans, MD

## 2022-01-27 NOTE — Progress Notes (Signed)
[facility] Counselor/Therapist Progress Note  Patient ID: Dawn Simmons, MRN: [id number],    Date: 01/27/2022  Time Spent: 60 minutes  Treatment Type: Individual Therapy  Reported Symptoms: anxiety and depression and memory loss  Mental Status Exam: Appearance:  Casual     Behavior: Appropriate  Motor: Normal  Speech/Language:  Normal Rate  Affect: Blunt  Mood: pleasant  Thought process: normal  Thought content:   WNL  Sensory/Perceptual disturbances:   WNL  Orientation: oriented to person, place, time/date, and situation  Attention: Good  Concentration: Good  Memory: WNL  Fund of knowledge:  Good  Insight:   Good  Judgment:  Good  Impulse Control: Good   Risk Assessment: Danger to Self:  No Self-injurious Behavior: No Danger to Others: No Duty to Warn:no Physical Aggression / Violence:No  Access to Firearms a concern: No  Gang Involvement:No   Subjective: The patient attended a face-to-face individual therapy session in the office today.  The patient presents with a blunted affect and mood is pleasant.  The patient reports that she just went to her primary care physician prior to our appointment today and things are going well.  She thinks she has an MRI scheduled for either next week or the week after.  We talked about her continuing to do her treatment for PTSD and she went to her medication provider last week and they switched her from Prozac to Lexapro.  The patient seems to be doing a little better today and she reports that she danced twice last week and she has not been watching as much news.  I gave her positive feedback for managing the things that she needs to do and she was happy because she seemed to be able to remember some things better than she had.  We talked about her continuing to work on things the way we have been and she does experience some frustration at not being able to just be better altogether right away and I explained to her that  the posttraumatic stress did not happen overnight and that this solution was not going to happen overnight.  We will continue to work with patient on decreasing her black and white thinking and also on doing more things that help her calm down and relax and that she can enjoy more than worrying.   Interventions: Cognitive Behavioral Therapy and Eye Movement Desensitization and Reprocessing (EMDR)  Diagnosis:PTSD (post-traumatic stress disorder)  Plan: Treatment Plan  Strengths/Abilities:Insightful, motivated, supportive husband  Treatment Preferences: Outpatient Individual therapy  Statement of Needs: "I have a problem passing out and I was told I have PTSD"    Symptoms:  Demonstrates an exaggerated startle response.:(Status: improved). Depressed  or irritable mood.: (Status: improved). Describes a reliving of the event,  particularly through dissociative flashbacks.:  (Status: improved). Displays a  significant decline in interest and engagement in activities.: (Status: improved). Displays significant psychological and/or physiological distress resulting from internal and external  clues that are reminiscent of the traumatic event.: (Status: improved).  Experiences disturbances in sleep.: (Status: improved). Experiences disturbing  and persistent thoughts, images, and/or perceptions of the traumatic event.: (Status: improved). Experiences frequent nightmares.: (Status: improved).  Feelings of hopelessness, worthlessness, or inappropriate guilt.: (Status:  improved). Has been exposed to a traumatic event involving actual or perceived threat of death or  serious injury.: (Status: maintained). Impairment in social, occupational, or  other areas of functioning.: (Status: improved). Intentionally avoids activities,  places, people, or objects (e.Dawn., up-armored vehicles) that  evoke memories of the event.:(Status: maintained). Intentionally avoids thoughts, feelings, or discussions related  to  the traumatic event.: (Status: improved). Reports difficulty concentrating as  well as feelings of guilt.:  (Status: improved). Reports response of intense fear,  helplessness, or horror to the traumatic event.:  (Status: improved).  Problems Addressed: PTSD   Goals:  LTG:  1. Develop healthy thinking patterns and beliefs about self, others, and the world that lead to the alleviation and help prevent the relapse of  depression.  50% Objective Identify and replace thoughts and beliefs that support depression.  50 % Target Date: 2022-05-01 Frequency: Weekly Progress: 50 Modality: individual 2. Eliminate or reduce the negative impact trauma related symptoms have  on social, occupational, and family functioning.  50% Objective Learn and implement personal skills to manage challenging situations related to trauma. Target Date: 2022-05-01 Frequency: Weekly Progress: 30 Modality: individual3. No longer avoids persons, places, activities, and objects that are  reminiscent of the traumatic event. Objective 3.  Participate in Eye Movement Desensitization and Reprocessing (EMDR) to reduce emotional distress  related to traumatic thoughts, feelings, and images. Target Date: 2022-05-01 Frequency: Weekly Progress: 30 Modality: individual 4.  Learn and implement guided self-dialogue to manage thoughts, feelings, and urges brought on by  encounters with trauma-related situations. Target Date: 2022-05-01 Frequency: Weekly Progress: 40 Modality: individual 5.  No longer experiences intrusive event recollections, avoidance of event  reminders, intense arousal, or disinterest in activities or  relationships. Target Date:05/01/2022  Frequency : Weekly Progress 30    Modality: individual 6.Thinks about or openly discusses the traumatic event with others  without experiencing psychological or physiological distress. Target Date : 05/01/2022 Frequency: Weekly Progress:30   Modality:  Individual Interventions by Therapist:  CBT, problem solving therapy, EMDR, insight oriented.  Patient approved Treatment plan.  Dawn Simmons Dawn Dawn Ceasar, LCSW

## 2022-01-27 NOTE — Patient Instructions (Signed)
RSV (respiratory syncitial virus) vaccine at pharmacy, arexvy  Covid booster when new version  at pharmacy High dose flu shot

## 2022-01-28 NOTE — Assessment & Plan Note (Signed)
Is considering a course of Ketamine and magnets due to her persistent symptoms. She continues counseling as well. Discuss current state and plan of care for 30 minutes.

## 2022-01-31 ENCOUNTER — Ambulatory Visit
Admission: RE | Admit: 2022-01-31 | Discharge: 2022-01-31 | Disposition: A | Discharge status: Home / Self Care | Payer: Medicare Other | Source: Ambulatory Visit | Attending: Psychiatry | Admitting: Psychiatry

## 2022-01-31 ENCOUNTER — Encounter: Payer: Self-pay | Admitting: Psychiatry

## 2022-01-31 DIAGNOSIS — R413 Other amnesia: Secondary | ICD-10-CM

## 2022-01-31 MED ORDER — GADOPICLENOL 0.5 MMOL/ML IV SOLN
7.5000 mL | Freq: Once | INTRAVENOUS | Status: AC | PRN
  Administered 2022-01-31: 5 mL via INTRAVENOUS

## 2022-02-03 ENCOUNTER — Ambulatory Visit (INDEPENDENT_AMBULATORY_CARE_PROVIDER_SITE_OTHER): Payer: Medicare Other | Admitting: Psychology

## 2022-02-03 DIAGNOSIS — F4310 Post-traumatic stress disorder, unspecified: Secondary | ICD-10-CM

## 2022-02-03 NOTE — Progress Notes (Signed)
[facility] Counselor/Therapist Progress Note  Patient ID: Dawn Simmons, MRN: [id number],    Date: 02/03/2022  Time Spent: 60 minutes  Treatment Type: Individual Therapy  Reported Symptoms: anxiety and depression and memory loss  Mental Status Exam: Appearance:  Casual     Behavior: Appropriate  Motor: Normal  Speech/Language:  Normal Rate  Affect: Blunt  Mood: pleasant  Thought process: normal  Thought content:   WNL  Sensory/Perceptual disturbances:   WNL  Orientation: oriented to person, place, time/date, and situation  Attention: Good  Concentration: Good  Memory: WNL  Fund of knowledge:  Good  Insight:   Good  Judgment:  Good  Impulse Control: Good   Risk Assessment: Danger to Self:  No Self-injurious Behavior: No Danger to Others: No Duty to Warn:no Physical Aggression / Violence:No  Access to Firearms a concern: No  Gang Involvement:No   Subjective: The patient attended a face-to-face individual therapy session in the office today.  The patient presents with a blunted affect and mood is pleasant.  The patient reports that she thinks that the Lexapro is working better than the Prozac did.  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  The patient seemed to be handling this relatively well considering the gravity of the diagnosis.  We talked about how she was dealing with it and she seems to be reframing it pretty well.  She states that now she has an answer to why she is having some of the issues she is having.  We continued to look at things in a more positive way and we talked about some things that might be helpful to her to continue to stay emotionally healthy.  I encouraged her to continue to do her dancing as that provides her with some dual brain stimulation and we talked about the possibility that she may end up going to the neurologist and possibly getting some more medication  prescribed.  The patient seems to be making progress.   Interventions: Cognitive Behavioral Therapy and Eye Movement Desensitization and Reprocessing (EMDR)  Diagnosis:PTSD (post-traumatic stress disorder)  Plan: Treatment Plan  Strengths/Abilities:Insightful, motivated, supportive husband  Treatment Preferences: Outpatient Individual therapy  Statement of Needs: "I have a problem passing out and I was told I have PTSD"    Symptoms:  Demonstrates an exaggerated startle response.:(Status: improved). Depressed  or irritable mood.: (Status: improved). Describes a reliving of the event,  particularly through dissociative flashbacks.:  (Status: improved). Displays a  significant decline in interest and engagement in activities.: (Status: improved). Displays significant psychological and/or physiological distress resulting from internal and external  clues that are reminiscent of the traumatic event.: (Status: improved).  Experiences disturbances in sleep.: (Status: improved). Experiences disturbing  and persistent thoughts, images, and/or perceptions of the traumatic event.: (Status: improved). Experiences frequent nightmares.: (Status: improved).  Feelings of hopelessness, worthlessness, or inappropriate guilt.: (Status:  improved). Has been exposed to a traumatic event involving actual or perceived threat of death or  serious injury.: (Status: maintained). Impairment in social, occupational, or  other areas of functioning.: (Status: improved). Intentionally avoids activities,  places, people, or objects (e.g., up-armored vehicles) that evoke memories of the event.:(Status: maintained). Intentionally avoids thoughts, feelings, or discussions related  to the traumatic event.: (Status: improved). Reports difficulty concentrating as  well as feelings of guilt.:  (Status: improved). Reports response of intense fear,  helplessness, or horror to the traumatic event.:  (  Status:  improved).  Problems Addressed: PTSD   Goals:  LTG:  1. Develop healthy thinking patterns and beliefs about self, others, and the world that lead to the alleviation and help prevent the relapse of  depression.  50% Objective Identify and replace thoughts and beliefs that support depression.  50 % Target Date: 2022-05-01 Frequency: Weekly Progress: 50 Modality: individual 2. Eliminate or reduce the negative impact trauma related symptoms have  on social, occupational, and family functioning.  50% Objective Learn and implement personal skills to manage challenging situations related to trauma. Target Date: 2022-05-01 Frequency: Weekly Progress: 30 Modality: individual3. No longer avoids persons, places, activities, and objects that are  reminiscent of the traumatic event. Objective 3.  Participate in Eye Movement Desensitization and Reprocessing (EMDR) to reduce emotional distress  related to traumatic thoughts, feelings, and images. Target Date: 2022-05-01 Frequency: Weekly Progress: 30 Modality: individual 4.  Learn and implement guided self-dialogue to manage thoughts, feelings, and urges brought on by  encounters with trauma-related situations. Target Date: 2022-05-01 Frequency: Weekly Progress: 40 Modality: individual 5.  No longer experiences intrusive event recollections, avoidance of event  reminders, intense arousal, or disinterest in activities or  relationships. Target Date:05/01/2022  Frequency : Weekly Progress 30    Modality: individual 6.Thinks about or openly discusses the traumatic event with others  without experiencing psychological or physiological distress. Target Date : 05/01/2022 Frequency: Weekly Progress:30   Modality: Individual Interventions by Therapist:  CBT, problem solving therapy, EMDR, insight oriented.  Patient approved Treatment plan.  Marymargaret Kirker G Rima Blizzard,  LCSW

## 2022-02-04 ENCOUNTER — Telehealth: Payer: Self-pay | Admitting: Family Medicine

## 2022-02-04 NOTE — Telephone Encounter (Signed)
Pt was wondering if pcp could look at the mri results ordered by Dr.Chima of her brain and explain this to pt. She stated the dr has given her recommendations but is not sure what route to go and would like to set up an appt with pcp to discuss. Please advise.

## 2022-02-07 ENCOUNTER — Ambulatory Visit: Payer: Medicare Other | Admitting: Family Medicine

## 2022-02-07 ENCOUNTER — Telehealth: Payer: Self-pay | Admitting: Psychiatry

## 2022-02-07 NOTE — Telephone Encounter (Signed)
Called pt was advised pt stated she going to make appt  for follow up with neurology And looking for appt with [place] to discuss Mri

## 2022-02-07 NOTE — Telephone Encounter (Signed)
Pt is calling. Stated she would like a follow-up appointment with Dr. Billey Gosling because MRI results was not good. Pt is requesting a call back from nurse.

## 2022-02-08 NOTE — Telephone Encounter (Signed)
Called Pt. She wasn't available at the time left a message to call back.

## 2022-02-08 NOTE — Telephone Encounter (Signed)
Please go ahead and schedule FU, Dr Billey Gosling discussed her results via [facility].  If she is willing to see NP she can.

## 2022-02-09 ENCOUNTER — Ambulatory Visit (INDEPENDENT_AMBULATORY_CARE_PROVIDER_SITE_OTHER): Payer: Medicare Other | Admitting: Adult Health

## 2022-02-09 ENCOUNTER — Encounter: Payer: Self-pay | Admitting: Adult Health

## 2022-02-09 ENCOUNTER — Telehealth: Payer: Self-pay

## 2022-02-09 VITALS — BP 122/69 | HR 68 | Ht 65.0 in

## 2022-02-09 DIAGNOSIS — F4310 Post-traumatic stress disorder, unspecified: Secondary | ICD-10-CM

## 2022-02-09 DIAGNOSIS — R413 Other amnesia: Secondary | ICD-10-CM | POA: Diagnosis not present

## 2022-02-09 NOTE — Patient Instructions (Signed)
Your Plan:  Please let me know if you would like to try any medications for your memory such as Aricept   Continue to follow with behavioral health as scheduled      Thank you for coming to see [place] at [facility]. I hope we have been able to provide you high quality care today.  You may receive a patient satisfaction survey over the next few weeks. We would appreciate your feedback and comments so that we may continue to improve ourselves and the health of our patients.

## 2022-02-09 NOTE — Progress Notes (Signed)
[facility]  PATIENT: Dawn Simmons DOB: [birth year]  REFERRING CLINICIAN: Bradd Canary, MD HISTORY FROM: self, husband REASON FOR VISIT: memory loss   HISTORICAL  CHIEF COMPLAINT:  Chief Complaint  Patient presents with   Follow-up memory loss    Pt reports feeling okay. Room 3 with husband    HISTORY OF PRESENT ILLNESS:    Update 02/09/2022 JM: Patient returns for sooner scheduled visit to review MRI results.  She is accompanied by her husband.  She was previously seen 1 month ago by Dr. Delena Bali for memory loss since 08-03-2015.  MRI brain w/wo contrast completed on 01/31/2022 showed mild generalized cortical atrophy more advanced in the medial temporal lobes significantly progressed compared to 08/03/2015 imaging as well as mild chronic microvascular ischemic changes typical for age, otherwise unremarkable.  Lab work for reversible causes normal.  EEG normal.   Feels like her memory has been stable if not improved since prior visit.  She was started on Lexapro by psychiatrist about 1.5 weeks ago and noticed great improvement of her mood as well as some improvement in memory.  Unfortunately, her cat passed away recently (was hit in the road) and has had a set back since then.  She also continues to follow with psychology. She notes some issues with losing train of thought during conversation and forgetting extended family members names.      History provided for reference purposes only Consult visit 01/06/2022 Dr. Delena Bali: The patient presents for evaluation of memory loss which has been present since 08-03-2015.  She has had multiple episodes where she has passed out. Started passing out in Aug 03, 2014. Would pass out multiple times per day. Initially saw Cardiology and underwent a negative cardiac workup. Last episode of syncope was in 08/02/2016.  Had an episode of amnesia at [employer] in 03-Aug-2015. Went to lunch with a friend, then put her head on the table and lost consciousness. Tried to  pick her head up several times and was unable to. When she finally came to she did not know who she was, where she was, or where she lived. States she was a "blank slate" when she went home. Confusion improved somewhat and she began to remember who she was and who her family was. However she feels she has never fully gotten back to baseline since that episode. States she has still forgotten several old memories and had to relearn many things. Had to stop working.  She has persistent cognitive changes but does not feel they have progressed much over time. Continues to struggle with PTSD and depression. Mother of her grandchild recently overdosed and passed away in 2022-08-03 of this year.  Notes that she sees yellow lines whenever she looks at a white background. This is always present.  Had an EEG in 2015-08-03 which showed mild background slowing and was otherwise unremarkable. Brain MRI in August 03, 2015 showed age related white matter changes and was otherwise unremarkable.  She underwent a neuropsych evaluation which showed a significant psychological component to impairment in her working memory and recall. An early neurodegenerative process could not be ruled out. She was recommended to undergo repeat neuropsychological testing in 18 months.  [facility] 11/05/21 showed mild cerebral volume loss and chronic small vessel ischemic disease. No acute process. Carotid US was negative for stenosis.  TBI:  No past history of TBI Stroke:  no past history of stroke Seizures:  no known history of seizures, multiple episodes of syncope Sleep: Takes valium to  sleep Mood: History of PTSD on Prozac and Valium. She continues to have a depressed mood and frequently brings up her difficult childhood. States she has always had a difficult life. Left home at age 83.  Functional status:  Patient lives with her husband Cooking: Used to [employer] all the time, does not cook anymore.  Husband thinks she probably could start cooking again but seems  too anxious to start Driving: She is not currently driving. Has not had issues getting to familiar places Bills: Used to manage finances but has not done as much recently Medications: Sometimes forgets to take her multivitamins Ever left the stove on by accident?: no Getting lost going to familiar places?: no Forgetting loved ones names?: yes, during episode of amnesia Word finding difficulty? yes  OTHER MEDICAL CONDITIONS: Von Willebrand disease, mitral valve prolapse, HLD, PTSD, migraines   REVIEW OF SYSTEMS: Full 14 system review of systems performed and negative with exception of: memory loss, PTSD  ALLERGIES: Allergies  Allergen Reactions   Doxycycline Swelling   Tetanus Toxoids Other (See Comments)    PAIN IN NECK AND FACE   Flexeril [Cyclobenzaprine] Other (See Comments)    HOME MEDICATIONS: Outpatient Medications Prior to Visit  Medication Sig Dispense Refill   Ascorbic Acid (VITAMIN C) 100 MG tablet Take 100 mg by mouth daily.     Calcium-Magnesium 200-100 MG TABS Take 4 tablets by mouth daily. 2 tablets in the morning and 2 tablets in the evening     diazepam (VALIUM) 5 MG tablet Take 1 tablet (5 mg total) by mouth at bedtime. Take 1/2-1 tablet po QHS 90 tablet 0   ergocalciferol (DRISDOL) 200 MCG/ML drops      escitalopram (LEXAPRO) 10 MG tablet Take 1/2 tablet daily for one week, then increase to 1 tablet daily 30 tablet 1   loratadine (CLARITIN) 10 MG tablet Take 10 mg by mouth daily.     MAGNESIUM PO Take 200 mg by mouth at bedtime.     Multiple Vitamin (MULTIVITAMIN) capsule Take 1 capsule by mouth daily.     nystatin (MYCOSTATIN) 100000 UNIT/ML suspension Apply to corners of mouth three x daily 60 mL 1   Omega-3 1000 MG CAPS Take by mouth.     triamcinolone cream (KENALOG) 0.1 % Apply 1 application topically 2 (two) times daily. 80 g 1   TURMERIC PO Take by mouth.     No facility-administered medications prior to visit.    PAST MEDICAL HISTORY: Past Medical  History:  Diagnosis Date   H/O fibromyalgia    H/O insomnia    H/O measles    H/O rubella    H/O syncope    History of chicken pox 09/15/2019   History of palpitations    Hx of mumps    Hyperlipidemia    Mitral valve prolapse    Palpitations    Syncope    Von Willebrand disease ([place])     PAST SURGICAL HISTORY: Past Surgical History:  Procedure Laterality Date   NO PAST SURGERIES      FAMILY HISTORY: Family History  Problem Relation Age of Onset   Stroke Mother    Emphysema Father        smoked   Alcohol abuse Father    Heart attack Maternal Grandfather    Heart attack Paternal Grandfather    Heart attack Maternal Uncle    Heart failure Maternal Grandmother    Heart failure Paternal Grandmother    OCD Son    Drug  abuse Son    Seizures Neg Hx     SOCIAL HISTORY: Social History   Socioeconomic History   Marital status: Married    Spouse name: tony   Number of children: 4   Years of education: 12   Highest education level: Not on file  Occupational History   Not on file  Tobacco Use   Smoking status: Former    Packs/day: 0.50    Years: 10.00    Total pack years: 5.00    Types: Cigarettes    Quit date: 04/27/1972    Years since quitting: 49.8   Smokeless tobacco: Never  Vaping Use   Vaping Use: Never used  Substance and Sexual Activity   Alcohol use: Yes    Alcohol/week: 0.0 standard drinks of alcohol    Comment: occ 1 glass wine or beer   Drug use: No   Sexual activity: Not on file  Other Topics Concern   Not on file  Social History Narrative   Lives at home with husband   Caffeine use- tea, 1 cup/day   Social Determinants of Health   Financial Resource Strain: Low Risk  (12/13/2019)   Overall Financial Resource Strain (CARDIA)    Difficulty of Paying Living Expenses: Not hard at all  Food Insecurity: No Food Insecurity (12/13/2019)   Hunger Vital Sign    Worried About Running Out of Food in the Last Year: Never true    Ran Out of Food in the  Last Year: Never true  Transportation Needs: No Transportation Needs (02/08/2021)   PRAPARE - Administrator, Civil Service (Medical): No    Lack of Transportation (Non-Medical): No  Physical Activity: Inactive (02/08/2021)   Exercise Vital Sign    Days of Exercise per Week: 0 days    Minutes of Exercise per Session: 0 min  Stress: Not on file  Social Connections: Not on file  Intimate Partner Violence: Not At Risk (02/08/2021)   Humiliation, Afraid, Rape, and Kick questionnaire    Fear of Current or Ex-Partner: No    Emotionally Abused: No    Physically Abused: No    Sexually Abused: No     PHYSICAL EXAM  GENERAL EXAM/CONSTITUTIONAL: Vitals:  Vitals:   02/09/22 1426  BP: 122/69  Pulse: 68  Height: 5\' 5"  (1.651 m)    Body mass index is 20.9 kg/m. Wt Readings from Last 3 Encounters:  01/27/22 125 lb 9.6 oz (57 kg)  01/06/22 127 lb (57.6 kg)  12/20/21 125 lb (56.7 kg)    NEUROLOGIC: MENTAL STATUS:     02/09/2022    2:30 PM 01/06/2022   10:19 AM  Montreal Cognitive Assessment   Visuospatial/ Executive (0/5) 5 5  Naming (0/3) 3 3  Attention: Read list of digits (0/2) 2 2  Attention: Read list of letters (0/1) 1 1  Attention: Serial 7 subtraction starting at 100 (0/3) 2 3  Language: Repeat phrase (0/2) 2 2  Language : Fluency (0/1) 1 1  Abstraction (0/2) 2 2  Delayed Recall (0/5) 0 0  Orientation (0/6) 4 4  Total 22 23  Adjusted Score (based on education) 22 23   Mildly disinhibited, preoccupied with difficult childhood and the recent passing of her,  difficult to redirect. Repeats herself frequently  CRANIAL NERVE:  2nd, 3rd, 4th, 6th - pupils equal and reactive to light, visual fields full to confrontation, extraocular muscles intact, no nystagmus 5th - facial sensation symmetric 7th - facial strength symmetric 8th -  hearing intact 9th - palate elevates symmetrically, uvula midline 11th - shoulder shrug symmetric 12th - tongue protrusion  midline  MOTOR:  normal bulk and tone, no cogwheeling, full strength in the BUE, BLE  SENSORY:  normal and symmetric to light touch all 4 extremities  COORDINATION:  finger-nose-finger, fine finger movements normal, fine postural tremor  REFLEXES:  deep tendon reflexes present and symmetric  GAIT/STATION:  normal     DIAGNOSTIC DATA (LABS, IMAGING, TESTING) - I reviewed patient records, labs, notes, testing and imaging myself where available.  Lab Results  Component Value Date   WBC 4.0 01/25/2021   HGB 12.9 01/25/2021   HCT 38.1 01/25/2021   MCV 93.4 01/25/2021   PLT 272.0 01/25/2021      Component Value Date/Time   NA 140 01/25/2021 1504   K 4.0 01/25/2021 1504   CL 106 01/25/2021 1504   CO2 25 01/25/2021 1504   GLUCOSE 88 01/25/2021 1504   BUN 17 01/25/2021 1504   CREATININE 0.88 01/25/2021 1504   CREATININE 0.97 (H) 01/10/2020 1358   CALCIUM 10.0 01/25/2021 1504   PROT 6.9 01/25/2021 1504   ALBUMIN 4.6 01/25/2021 1504   AST 23 01/25/2021 1504   ALT 15 01/25/2021 1504   ALKPHOS 67 01/25/2021 1504   BILITOT 0.6 01/25/2021 1504   Lab Results  Component Value Date   CHOL 227 (H) 11/03/2021   HDL 62.50 11/03/2021   LDLCALC 127 (H) 11/03/2021   TRIG 189.0 (H) 11/03/2021   CHOLHDL 4 11/03/2021   No results found for: "HGBA1C" Lab Results  Component Value Date   VITAMINB12 989 01/06/2022   Lab Results  Component Value Date   TSH 2.450 01/06/2022      ASSESSMENT AND PLAN  76 y.o. year old female with a history of Von Willebrand disease, mitral valve prolapse, HLD, PTSD, migraines who presents for evaluation of memory loss, confusion, and syncope with initial consult visit with Dr. Delena Bali on 01/06/2022.  [place] today 22/30 (prior 23/30).  Feels like memory has improved some as well as mood since initiating Lexapro 1.5 weeks ago.  Closely followed by behavioral health. Reviewed MRI scan with patient and husband during visit which showed some advanced  atrophy in the medial temporal lobes consistent with Alzheimer's disease as well as mild chronic microvascular ischemic changes. May have a mix of Alzheimer's type dementia and vascular type dementia but do suspect underlying mental health disorders contributing to memory difficulty   1. Memory loss   2. PTSD (post-traumatic stress disorder)      PLAN: -Discussed potential benefit with Aricept but declines interest at this time as her memory has been stable if not some improvement since prior visit subjectively -She was advised to call office if she is interested in medication usage in the future -Encouraged to continue close follow-up with behavioral health for anxiety and PTSD management    Return if symptoms worsen or fail to improve.   I spent 38 minutes of face-to-face and non-face-to-face time with patient and husband.  This included previsit chart review, lab review, study review, order entry, electronic health record documentation, patient and husband education and discussion regarding above diagnoses and treatment plan and answered all the questions to patient's satisfaction  Ihor Austin, [facility]  [facility] [address] [place], [place] [address]  Phone [phone] Fax [phone] Note: This document was prepared with digital dictation and possible smart phrase technology. Any transcriptional errors that result from this process are unintentional.

## 2022-02-09 NOTE — Telephone Encounter (Signed)
Error

## 2022-02-10 ENCOUNTER — Ambulatory Visit (INDEPENDENT_AMBULATORY_CARE_PROVIDER_SITE_OTHER): Payer: Medicare Other | Admitting: Psychology

## 2022-02-10 DIAGNOSIS — F4310 Post-traumatic stress disorder, unspecified: Secondary | ICD-10-CM | POA: Diagnosis not present

## 2022-02-10 DIAGNOSIS — F32A Depression, unspecified: Secondary | ICD-10-CM

## 2022-02-10 NOTE — Progress Notes (Signed)
[facility] Counselor/Therapist Progress Note  Patient ID: Dawn Simmons, MRN: [id number],    Date: 02/10/2022  Time Spent: 60 minutes  Treatment Type: Individual Therapy  Reported Symptoms: anxiety and depression and memory loss  Mental Status Exam: Appearance:  Casual     Behavior: Appropriate  Motor: Normal  Speech/Language:  Normal Rate  Affect: Blunt  Mood: pleasant  Thought process: normal  Thought content:   WNL  Sensory/Perceptual disturbances:   WNL  Orientation: oriented to person, place, time/date, and situation  Attention: Good  Concentration: Good  Memory: WNL  Fund of knowledge:  Good  Insight:   Good  Judgment:  Good  Impulse Control: Good   Risk Assessment: Danger to Self:  No Self-injurious Behavior: No Danger to Others: No Duty to Warn:no Physical Aggression / Violence:No  Access to Firearms a concern: No  Gang Involvement:No   Subjective: The patient attended a face-to-face individual therapy session in the office today.  The patient presents with a blunted affect and mood is sad.  The patient's husband came in for a few minutes so that I could speak with him about what the MRI results were.  The patient did see a neurologist PA yesterday and apparently that went well.  She reviewed the results of the MRI with the patient and her husband and apparently she does potentially have some shrinkage in her temporal lobes.  They said that she could take Aricept but did not push her taking it so evidently the decline is minimal at present.  I did recommend that they make sure that Corie Chiquito gets the information from Dr. Izell Caledonia so that she can determine if the patient needs TMS or ketamine to help her with her treatment.  My feeling is is that she is doing better on the Lexapro and that we may not need to do anything different than what were already doing and in regard to her treatment.  The patient was very sad today because her cat got hit by a  car on 2022-08-19 and died.  We talked about this in talked about the grief it is associated with losing a pet.   Interventions: Cognitive Behavioral Therapy and Eye Movement Desensitization and Reprocessing (EMDR)  Diagnosis:PTSD (post-traumatic stress disorder)  Depression, unspecified depression type  Plan: Treatment Plan  Strengths/Abilities:Insightful, motivated, supportive husband  Treatment Preferences: Outpatient Individual therapy  Statement of Needs: "I have a problem passing out and I was told I have PTSD"    Symptoms:  Demonstrates an exaggerated startle response.:(Status: improved). Depressed  or irritable mood.: (Status: improved). Describes a reliving of the event,  particularly through dissociative flashbacks.:  (Status: improved). Displays a  significant decline in interest and engagement in activities.: (Status: improved). Displays significant psychological and/or physiological distress resulting from internal and external  clues that are reminiscent of the traumatic event.: (Status: improved).  Experiences disturbances in sleep.: (Status: improved). Experiences disturbing  and persistent thoughts, images, and/or perceptions of the traumatic event.: (Status: improved). Experiences frequent nightmares.: (Status: improved).  Feelings of hopelessness, worthlessness, or inappropriate guilt.: (Status:  improved). Has been exposed to a traumatic event involving actual or perceived threat of death or  serious injury.: (Status: maintained). Impairment in social, occupational, or  other areas of functioning.: (Status: improved). Intentionally avoids activities,  places, people, or objects (e.g., up-armored vehicles) that evoke memories of the event.:(Status: maintained). Intentionally avoids thoughts, feelings, or discussions related  to the traumatic event.: (Status: improved). Reports difficulty concentrating as  well as feelings of guilt.:  (Status: improved). Reports response  of intense fear,  helplessness, or horror to the traumatic event.:  (Status: improved).  Problems Addressed: PTSD   Goals:  LTG:  1. Develop healthy thinking patterns and beliefs about self, others, and the world that lead to the alleviation and help prevent the relapse of  depression.  50% Objective Identify and replace thoughts and beliefs that support depression.  50 % Target Date: 2022-05-01 Frequency: Weekly Progress: 50 Modality: individual 2. Eliminate or reduce the negative impact trauma related symptoms have  on social, occupational, and family functioning.  50% Objective Learn and implement personal skills to manage challenging situations related to trauma. Target Date: 2022-05-01 Frequency: Weekly Progress: 30 Modality: individual3. No longer avoids persons, places, activities, and objects that are  reminiscent of the traumatic event. Objective 3.  Participate in Eye Movement Desensitization and Reprocessing (EMDR) to reduce emotional distress  related to traumatic thoughts, feelings, and images. Target Date: 2022-05-01 Frequency: Weekly Progress: 30 Modality: individual 4.  Learn and implement guided self-dialogue to manage thoughts, feelings, and urges brought on by  encounters with trauma-related situations. Target Date: 2022-05-01 Frequency: Weekly Progress: 40 Modality: individual 5.  No longer experiences intrusive event recollections, avoidance of event  reminders, intense arousal, or disinterest in activities or  relationships. Target Date:05/01/2022  Frequency : Weekly Progress 30    Modality: individual 6.Thinks about or openly discusses the traumatic event with others  without experiencing psychological or physiological distress. Target Date : 05/01/2022 Frequency: Weekly Progress:30   Modality: Individual Interventions by Therapist:  CBT, problem solving therapy, EMDR, insight oriented.  Patient approved Treatment plan.  Quinlyn Tep G Xzandria Clevinger,  LCSW

## 2022-02-14 ENCOUNTER — Ambulatory Visit (INDEPENDENT_AMBULATORY_CARE_PROVIDER_SITE_OTHER): Payer: Medicare Other | Admitting: Family Medicine

## 2022-02-14 VITALS — BP 115/68 | HR 88 | Temp 98.0°F | Resp 16 | Ht 65.0 in | Wt 126.2 lb

## 2022-02-14 DIAGNOSIS — R413 Other amnesia: Secondary | ICD-10-CM | POA: Diagnosis not present

## 2022-02-14 DIAGNOSIS — Z23 Encounter for immunization: Secondary | ICD-10-CM | POA: Diagnosis not present

## 2022-02-14 DIAGNOSIS — F4310 Post-traumatic stress disorder, unspecified: Secondary | ICD-10-CM | POA: Diagnosis not present

## 2022-02-14 DIAGNOSIS — M797 Fibromyalgia: Secondary | ICD-10-CM

## 2022-02-14 NOTE — Assessment & Plan Note (Signed)
Is noting a click in her right hip with certain movements but no pain associated. Encouarged to keep moving unless pain develops

## 2022-02-14 NOTE — Assessment & Plan Note (Signed)
Had a recent MRI showed Mild generalized cortical atrophy that is more advanced in the medial temporal lobes, unchanged compared to the 11/05/2021 CT scan but significantly progressed compared to the 06/14/2015 MRI when no significant atrophy was noted.  Although not entirely specific, this pattern is consistent with Alzheimer's disease Some T2/FLAIR hyperintense foci in the cerebral white matter consistent with mild chronic microvascular ischemic changes, mildly progressed compared to the 2017 MRI.  The extent is typical for age. She is following with neurology, encouraged to exercise, get rest, eat a heart healthy diet and hydrate well. Learn something new and take a MVI and fish oil cap. Spent 22 minutes discussing plan of care and current state with patient and husband

## 2022-02-14 NOTE — Assessment & Plan Note (Signed)
Thayer Headings at [facility], she has changed her meds ie stopped the Prozac and started Lexapro 5 mg daily and she feels this helping more. Has increased to a full 10 mg tab daily without difficulty and some improvement. She has recently lost her cat to being hit by a car but now she feels better

## 2022-02-14 NOTE — Patient Instructions (Signed)
Take a multivitamin with minerals and Fish oil caps  Increase exercise, get 6-8 hours of sleep, hydrate well 60-80 ounces of water, heart healthy , low inflammation diet such as the Mediterranean or MIND diet  8 flights of stairs a day Learn something new, music etc  Memory Compensation Strategies  Use "WARM" strategy.  W= write it down  A= associate it  R= repeat it  M= make a mental note  2.   You can keep a Social worker.  Use a 3-ring notebook with sections for the following: calendar, important names and phone numbers,  medications, doctors' names/phone numbers, lists/reminders, and a section to journal what you did  each day.   3.    Use a calendar to write appointments down.  4.    Write yourself a schedule for the day.  This can be placed on the calendar or in a separate section of the Memory Notebook.  Keeping a  regular schedule can help memory.  5.    Use medication organizer with sections for each day or morning/evening pills.  You may need help loading it  6.    Keep a basket, or pegboard by the door.  Place items that you need to take out with you in the basket or on the pegboard.  You may also want to  include a message board for reminders.  7.    Use sticky notes.  Place sticky notes with reminders in a place where the task is performed.  For example: " turn off the  stove" placed by the stove, "lock the door" placed on the door at eye level, " take your medications" on  the bathroom mirror or by the place where you normally take your medications.  8.    Use alarms/timers.  Use while cooking to remind yourself to check on food or as a reminder to take your medicine, or as a  reminder to make a call, or as a reminder to perform another task, etc.

## 2022-02-14 NOTE — Progress Notes (Signed)
Subjective:   By signing my name below, I, Danise Edge MD, attest that this documentation has been prepared under the direction and in the presence of Danise Edge MD, 02/14/2022.   Patient ID: Dawn Simmons, female    DOB: 1945/11/27, 76 y.o.   MRN: [id number]  No chief complaint on file.   HPI Patient is in today for an office visit.  Right hip She reports a clicking with her right hip and states no associated pain.  Depression Patient reports that her provider, Dr. Corie Chiquito, stopped her Prozac medication and started her with 5 mg Lexapro. She is currently complaint with her 10 mg Lexapro. She states that she is tolerating it well and is effective.   Insomnia  Patient reports she is complaint with her 5 mg Valium medication.     Immunizations She is receiving an influenza vaccine this visit.  Health Maintenance Due  Topic Date Due   INFLUENZA VACCINE  11/09/2021   Medicare Annual Wellness (AWV)  02/08/2022    Past Medical History:  Diagnosis Date   H/O fibromyalgia    H/O insomnia    H/O measles    H/O rubella    H/O syncope    History of chicken pox 09/15/2019   History of palpitations    Hx of mumps    Hyperlipidemia    Mitral valve prolapse    Palpitations    Syncope    Von Willebrand disease (HCC)     Past Surgical History:  Procedure Laterality Date   NO PAST SURGERIES      Family History  Problem Relation Age of Onset   Stroke Mother    Emphysema Father        smoked   Alcohol abuse Father    Heart attack Maternal Grandfather    Heart attack Paternal Grandfather    Heart attack Maternal Uncle    Heart failure Maternal Grandmother    Heart failure Paternal Grandmother    OCD Son    Drug abuse Son    Seizures Neg Hx     Social History   Socioeconomic History   Marital status: Married    Spouse name: tony   Number of children: 4   Years of education: 12   Highest education level: Not on file  Occupational History   Not on  file  Tobacco Use   Smoking status: Former    Packs/day: 0.50    Years: 10.00    Total pack years: 5.00    Types: Cigarettes    Quit date: 04/27/1972    Years since quitting: 49.8   Smokeless tobacco: Never  Vaping Use   Vaping Use: Never used  Substance and Sexual Activity   Alcohol use: Yes    Alcohol/week: 0.0 standard drinks of alcohol    Comment: occ 1 glass wine or beer   Drug use: No   Sexual activity: Not on file  Other Topics Concern   Not on file  Social History Narrative   Lives at home with husband   Caffeine use- tea, 1 cup/day   Social Determinants of Health   Financial Resource Strain: Low Risk  (12/13/2019)   Overall Financial Resource Strain (CARDIA)    Difficulty of Paying Living Expenses: Not hard at all  Food Insecurity: No Food Insecurity (12/13/2019)   Hunger Vital Sign    Worried About Running Out of Food in the Last Year: Never true    Ran Out of Food in the Last  Year: Never true  Transportation Needs: No Transportation Needs (02/08/2021)   PRAPARE - Administrator, Civil Service (Medical): No    Lack of Transportation (Non-Medical): No  Physical Activity: Inactive (02/08/2021)   Exercise Vital Sign    Days of Exercise per Week: 0 days    Minutes of Exercise per Session: 0 min  Stress: Not on file  Social Connections: Not on file  Intimate Partner Violence: Not At Risk (02/08/2021)   Humiliation, Afraid, Rape, and Kick questionnaire    Fear of Current or Ex-Partner: No    Emotionally Abused: No    Physically Abused: No    Sexually Abused: No    Outpatient Medications Prior to Visit  Medication Sig Dispense Refill   Ascorbic Acid (VITAMIN C) 100 MG tablet Take 100 mg by mouth daily.     Calcium-Magnesium 200-100 MG TABS Take 4 tablets by mouth daily. 2 tablets in the morning and 2 tablets in the evening     diazepam (VALIUM) 5 MG tablet Take 1 tablet (5 mg total) by mouth at bedtime. Take 1/2-1 tablet po QHS 90 tablet 0    ergocalciferol (DRISDOL) 200 MCG/ML drops      escitalopram (LEXAPRO) 10 MG tablet Take 1/2 tablet daily for one week, then increase to 1 tablet daily 30 tablet 1   loratadine (CLARITIN) 10 MG tablet Take 10 mg by mouth daily.     MAGNESIUM PO Take 200 mg by mouth at bedtime.     Multiple Vitamin (MULTIVITAMIN) capsule Take 1 capsule by mouth daily.     nystatin (MYCOSTATIN) 100000 UNIT/ML suspension Apply to corners of mouth three x daily 60 mL 1   Omega-3 1000 MG CAPS Take by mouth.     triamcinolone cream (KENALOG) 0.1 % Apply 1 application topically 2 (two) times daily. 80 g 1   TURMERIC PO Take by mouth.     No facility-administered medications prior to visit.    Allergies  Allergen Reactions   Doxycycline Swelling   Tetanus Toxoids Other (See Comments)    PAIN IN NECK AND FACE   Flexeril [Cyclobenzaprine] Other (See Comments)    ROS     Objective:    Physical Exam Constitutional:      General: She is not in acute distress.    Appearance: Normal appearance. She is not ill-appearing.  HENT:     Head: Normocephalic and atraumatic.     Right Ear: External ear normal.     Left Ear: External ear normal.  Eyes:     Extraocular Movements: Extraocular movements intact.     Pupils: Pupils are equal, round, and reactive to light.  Cardiovascular:     Rate and Rhythm: Normal rate and regular rhythm.     Heart sounds: Normal heart sounds. No murmur heard.    No gallop.  Pulmonary:     Effort: Pulmonary effort is normal. No respiratory distress.     Breath sounds: Normal breath sounds. No wheezing or rales.  Skin:    General: Skin is warm and dry.  Neurological:     Mental Status: She is alert and oriented to person, place, and time.  Psychiatric:        Judgment: Judgment normal.     There were no vitals taken for this visit. Wt Readings from Last 3 Encounters:  01/27/22 125 lb 9.6 oz (57 kg)  01/06/22 127 lb (57.6 kg)  12/20/21 125 lb (56.7 kg)  Assessment  & Plan:   Problem List Items Addressed This Visit   None  No orders of the defined types were placed in this encounter.   Cranston Neighbor MD, personally preformed the services described in this documentation.  All medical record entries made by the scribe were at my direction and in my presence.  I have reviewed the chart and discharge instructions (if applicable) and agree that the record reflects my personal performance and is accurate and complete. 02/14/2022.   I,Verona Buck,acting as a Education administrator for Penni Homans, MD.,have documented all relevant documentation on the behalf of Penni Homans, MD,as directed by  Penni Homans, MD while in the presence of Penni Homans, MD.    Luna Glasgow

## 2022-02-17 ENCOUNTER — Ambulatory Visit (INDEPENDENT_AMBULATORY_CARE_PROVIDER_SITE_OTHER): Payer: Medicare Other | Admitting: Psychology

## 2022-02-17 DIAGNOSIS — F4310 Post-traumatic stress disorder, unspecified: Secondary | ICD-10-CM | POA: Diagnosis not present

## 2022-02-17 DIAGNOSIS — F32A Depression, unspecified: Secondary | ICD-10-CM

## 2022-02-17 NOTE — Progress Notes (Signed)
[facility] Counselor/Therapist Progress Note  Patient ID: Roxi Hlavaty, MRN: [id number],    Date: 02/17/2022  Time Spent: 60 minutes  Treatment Type: Individual Therapy  Reported Symptoms: anxiety and depression and memory loss  Mental Status Exam: Appearance:  Casual     Behavior: Appropriate  Motor: Normal  Speech/Language:  Normal Rate  Affect: Blunt  Mood: pleasant  Thought process: normal  Thought content:   WNL  Sensory/Perceptual disturbances:   WNL  Orientation: oriented to person, place, time/date, and situation  Attention: Good  Concentration: Good  Memory: WNL  Fund of knowledge:  Good  Insight:   Good  Judgment:  Good  Impulse Control: Good   Risk Assessment: Danger to Self:  No Self-injurious Behavior: No Danger to Others: No Duty to Warn:no Physical Aggression / Violence:No  Access to Firearms a concern: No  Gang Involvement:No   Subjective: The patient attended a face-to-face individual therapy session in the office today.  The patient presents with a blunted affect and mood is anxious.  The patient is still anxious and struggling because of the loss of her cat.  The patient stated that she felt anxious coming in the office today.  The patient seems to be doing better with her worries for the most part.  We talked about her black and white thinking again today as she tends to catastrophize a great deal and it causes her more anxiety.  We talked about how not to do that and the patient seems to have this ingrained in her way of being for now.  We will continue to work with her on decreasing her black and white thinking and we will continue to see what other things we need to address in therapy to help her manage her anxiety.  Interventions: Cognitive Behavioral Therapy and Eye Movement Desensitization and Reprocessing (EMDR)  Diagnosis:PTSD (post-traumatic stress disorder)  Depression, unspecified depression type  Plan: Treatment  Plan  Strengths/Abilities:Insightful, motivated, supportive husband  Treatment Preferences: Outpatient Individual therapy  Statement of Needs: "I have a problem passing out and I was told I have PTSD"    Symptoms:  Demonstrates an exaggerated startle response.:(Status: improved). Depressed  or irritable mood.: (Status: improved). Describes a reliving of the event,  particularly through dissociative flashbacks.:  (Status: improved). Displays a  significant decline in interest and engagement in activities.: (Status: improved). Displays significant psychological and/or physiological distress resulting from internal and external  clues that are reminiscent of the traumatic event.: (Status: improved).  Experiences disturbances in sleep.: (Status: improved). Experiences disturbing  and persistent thoughts, images, and/or perceptions of the traumatic event.: (Status: improved). Experiences frequent nightmares.: (Status: improved).  Feelings of hopelessness, worthlessness, or inappropriate guilt.: (Status:  improved). Has been exposed to a traumatic event involving actual or perceived threat of death or  serious injury.: (Status: maintained). Impairment in social, occupational, or  other areas of functioning.: (Status: improved). Intentionally avoids activities,  places, people, or objects (e.g., up-armored vehicles) that evoke memories of the event.:(Status: maintained). Intentionally avoids thoughts, feelings, or discussions related  to the traumatic event.: (Status: improved). Reports difficulty concentrating as  well as feelings of guilt.:  (Status: improved). Reports response of intense fear,  helplessness, or horror to the traumatic event.:  (Status: improved).  Problems Addressed: PTSD   Goals:  LTG:  1. Develop healthy thinking patterns and beliefs about self, others, and the world that lead to the alleviation and help prevent the relapse of  depression.  50% Objective Identify  and  replace thoughts and beliefs that support depression.  50 % Target Date: 2022-05-01 Frequency: Weekly Progress: 50 Modality: individual 2. Eliminate or reduce the negative impact trauma related symptoms have  on social, occupational, and family functioning.  50% Objective Learn and implement personal skills to manage challenging situations related to trauma. Target Date: 2022-05-01 Frequency: Weekly Progress: 30 Modality: individual3. No longer avoids persons, places, activities, and objects that are  reminiscent of the traumatic event. Objective 3.  Participate in Eye Movement Desensitization and Reprocessing (EMDR) to reduce emotional distress  related to traumatic thoughts, feelings, and images. Target Date: 2022-05-01 Frequency: Weekly Progress: 30 Modality: individual 4.  Learn and implement guided self-dialogue to manage thoughts, feelings, and urges brought on by  encounters with trauma-related situations. Target Date: 2022-05-01 Frequency: Weekly Progress: 40 Modality: individual 5.  No longer experiences intrusive event recollections, avoidance of event  reminders, intense arousal, or disinterest in activities or  relationships. Target Date:05/01/2022  Frequency : Weekly Progress 30    Modality: individual 6.Thinks about or openly discusses the traumatic event with others  without experiencing psychological or physiological distress. Target Date : 05/01/2022 Frequency: Weekly Progress:30   Modality: Individual Interventions by Therapist:  CBT, problem solving therapy, EMDR, insight oriented.  Patient approved Treatment plan.  Faigy Stretch G Jeselle Hiser, LCSW

## 2022-02-24 ENCOUNTER — Ambulatory Visit (INDEPENDENT_AMBULATORY_CARE_PROVIDER_SITE_OTHER): Payer: Medicare Other | Admitting: Psychology

## 2022-02-24 DIAGNOSIS — F4310 Post-traumatic stress disorder, unspecified: Secondary | ICD-10-CM

## 2022-02-25 ENCOUNTER — Telehealth: Payer: Self-pay | Admitting: Family Medicine

## 2022-02-25 NOTE — Progress Notes (Signed)
[facility] Counselor/Therapist Progress Note  Patient ID: Dawn Simmons, MRN: [id number],    Date: 02/24/2022  Time Spent: 60 minutes  Treatment Type: Individual Therapy  Reported Symptoms: anxiety and depression and memory loss  Mental Status Exam: Appearance:  Casual     Behavior: Appropriate  Motor: Normal  Speech/Language:  Normal Rate  Affect: Blunt  Mood: pleasant  Thought process: normal  Thought content:   WNL  Sensory/Perceptual disturbances:   WNL  Orientation: oriented to person, place, time/date, and situation  Attention: Good  Concentration: Good  Memory: WNL  Fund of knowledge:  Good  Insight:   Good  Judgment:  Good  Impulse Control: Good   Risk Assessment: Danger to Self:  No Self-injurious Behavior: No Danger to Others: No Duty to Warn:no Physical Aggression / Violence:No  Access to Firearms a concern: No  Gang Involvement:No   Subjective: The patient attended a face-to-face individual therapy session in the office today.  The patient presents with a blunted affect and mood is pleasant.  The patient reports that she feels a little better than she did the last few weeks and seems to be dealing well with the loss of her cat.  The patient states that she does feel like the medication that she is on now is really making a difference for her.  We talked today again about all or nothing thinking as the patient seems to just continue to go to extremes with what every day is that she is talking about.  The example that she had today was that she is saying that this new medicine has made all the difference with her treatment.  I tried to get her to look at all the other proximal progress she has made with therapy and the other medication that she was on before and the patient has difficulty understanding and seeing how she does this.  I think this is what potentially got her in difficulty with coping all the years that she dealt with the difficult  things in her life.  She did a lot of black and white thinking and would get her expectations either too high or too low throughout her life.  We talked about potentially going to every 2 weeks with therapy.  We will reevaluate after the Thanksgiving holiday.   Interventions: Cognitive Behavioral Therapy and Eye Movement Desensitization and Reprocessing (EMDR)  Diagnosis:PTSD (post-traumatic stress disorder)  Plan: Treatment Plan  Strengths/Abilities:Insightful, motivated, supportive husband  Treatment Preferences: Outpatient Individual therapy  Statement of Needs: "I have a problem passing out and I was told I have PTSD"    Symptoms:  Demonstrates an exaggerated startle response.:(Status: improved). Depressed  or irritable mood.: (Status: improved). Describes a reliving of the event,  particularly through dissociative flashbacks.:  (Status: improved). Displays a  significant decline in interest and engagement in activities.: (Status: improved). Displays significant psychological and/or physiological distress resulting from internal and external  clues that are reminiscent of the traumatic event.: (Status: improved).  Experiences disturbances in sleep.: (Status: improved). Experiences disturbing  and persistent thoughts, images, and/or perceptions of the traumatic event.: (Status: improved). Experiences frequent nightmares.: (Status: improved).  Feelings of hopelessness, worthlessness, or inappropriate guilt.: (Status:  improved). Has been exposed to a traumatic event involving actual or perceived threat of death or  serious injury.: (Status: maintained). Impairment in social, occupational, or  other areas of functioning.: (Status: improved). Intentionally avoids activities,  places, people, or objects (e.g., up-armored vehicles) that evoke memories of the event.:(Status: maintained).  Intentionally avoids thoughts, feelings, or discussions related  to the traumatic event.: (Status:  improved). Reports difficulty concentrating as  well as feelings of guilt.:  (Status: improved). Reports response of intense fear,  helplessness, or horror to the traumatic event.:  (Status: improved).  Problems Addressed: PTSD   Goals:  LTG:  1. Develop healthy thinking patterns and beliefs about self, others, and the world that lead to the alleviation and help prevent the relapse of  depression.  50% Objective Identify and replace thoughts and beliefs that support depression.  50 % Target Date: 2022-05-01 Frequency: Weekly Progress: 50 Modality: individual 2. Eliminate or reduce the negative impact trauma related symptoms have  on social, occupational, and family functioning.  50% Objective Learn and implement personal skills to manage challenging situations related to trauma. Target Date: 2022-05-01 Frequency: Weekly Progress: 30 Modality: individual3. No longer avoids persons, places, activities, and objects that are  reminiscent of the traumatic event. Objective 3.  Participate in Eye Movement Desensitization and Reprocessing (EMDR) to reduce emotional distress  related to traumatic thoughts, feelings, and images. Target Date: 2022-05-01 Frequency: Weekly Progress: 30 Modality: individual 4.  Learn and implement guided self-dialogue to manage thoughts, feelings, and urges brought on by  encounters with trauma-related situations. Target Date: 2022-05-01 Frequency: Weekly Progress: 40 Modality: individual 5.  No longer experiences intrusive event recollections, avoidance of event  reminders, intense arousal, or disinterest in activities or  relationships. Target Date:05/01/2022  Frequency : Weekly Progress 30    Modality: individual 6.Thinks about or openly discusses the traumatic event with others  without experiencing psychological or physiological distress. Target Date : 05/01/2022 Frequency: Weekly Progress:30   Modality: Individual Interventions by Therapist:  CBT,  problem solving therapy, EMDR, insight oriented.  Patient approved Treatment plan.  Dawn Lamprecht G Rhythm Gubbels, LCSW

## 2022-02-25 NOTE — Telephone Encounter (Signed)
Tried calling patient to schedule Medicare Annual Wellness Visit (AWV) either virtually or in office.   No answer  Last AWV 02/08/21 please schedule with Nurse Health Adviser 2   45 min for awv-i and in office appointments 30 min for awv-s  phone/virtual appointments

## 2022-02-28 ENCOUNTER — Ambulatory Visit (INDEPENDENT_AMBULATORY_CARE_PROVIDER_SITE_OTHER): Payer: Medicare Other | Admitting: Psychiatry

## 2022-02-28 ENCOUNTER — Encounter: Payer: Self-pay | Admitting: Psychiatry

## 2022-02-28 DIAGNOSIS — F32A Depression, unspecified: Secondary | ICD-10-CM

## 2022-02-28 DIAGNOSIS — F4310 Post-traumatic stress disorder, unspecified: Secondary | ICD-10-CM | POA: Diagnosis not present

## 2022-02-28 MED ORDER — ESCITALOPRAM OXALATE 10 MG PO TABS: 10.0000 mg | ORAL_TABLET | Freq: Every day | ORAL | 1 refills | Status: DC

## 2022-02-28 NOTE — Progress Notes (Signed)
Dawn Simmons [id number] October 03, 1945 76 y.o.  Subjective:   Patient ID:  Dawn Simmons is a 76 y.o. (DOB [birth year]) female.  Chief Complaint:  Chief Complaint  Patient presents with   Follow-up    Anxiety, depression, and cognitive changes    HPI Dawn Simmons presents to the office today for follow-up of anxiety, depression, and cognitive changes. She reports that she is, "better, I think the medication change has been helpful." She reports that she noticed some improvement and then had a setback after witnessing her cat get run over by a car. They took her cat to the emergency vet and the cat did not survive. They had a funeral for the cat and buried him at home.   She reports that "my memory is better than it was on the other medicine." She reports that she had worsening memory after the death of her cat. She reports that her memory has been improving over the last week. She reports that she is remembering more things. She reports that she is not losing her train of thought as often and is able to recover faster.   She reports, "I feel a little more hopeful. I feel like I can deal with things a little better... I feel encouraged." She has been socializing more. 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 She reports that her motivation is ok and is lower in the winter months. Her energy is slightly lower since the start of the fall season and colder weather. Appetite is consistent with baseline. She does crossword puzzles and word finding puzzles. Denies SI.   She plans to take a trip to [place], [place] were she is from.  She is looking forward to this trip since she has not been in about 4 years. Recently reconnected with an old friend.    Enjoying her garden.   Continues to see Merry Lofty, LCSW for therapy.   Diazepam last filled 09/22/21.  Past Psychiatric Medication Trials: (She reports that she is very sensitive to medication) Diazepam- Prescribed as need with limited improvement.  Prozac- reports that she has to take Prozac 20 mg in divided doses.  Lexapro  GAD-7    Flowsheet Row Office Visit from 01/27/2022 in [facility] at [employer]  Total GAD-7 Score 0      PHQ2-9    Flowsheet Row Office Visit from 02/14/2022 in [facility] Office Visit from 01/27/2022 in [facility] Office Visit from 12/06/2021 in [facility] Office Visit from 10/19/2021 in [facility] Office Visit from 09/07/2021 in [facility]  PHQ-2 Total Score 0 2 0 2 2  PHQ-9 Total Score -- 4 0 13 9        Review of Systems:  Review of Systems  Musculoskeletal:  Negative for gait problem.  Neurological:  Negative for tremors.  Psychiatric/Behavioral:         Please refer to HPI    Medications: I have reviewed the patient's current medications.  Current Outpatient Medications  Medication Sig Dispense Refill   diazepam (VALIUM) 5 MG  tablet Take 1 tablet (5 mg total) by mouth at bedtime. Take 1/2-1 tablet po QHS 90 tablet 0   Ascorbic Acid (VITAMIN C) 100 MG tablet Take 100 mg by mouth daily.     Calcium-Magnesium 200-100 MG TABS Take 4 tablets by mouth daily. 2 tablets in the morning and 2 tablets in the evening     ergocalciferol (DRISDOL) 200 MCG/ML drops      escitalopram (LEXAPRO) 10 MG tablet Take 1 tablet (10 mg total) by mouth daily. Take 1/2 tablet daily for one week, then increase to 1 tablet daily 90 tablet 1   loratadine (CLARITIN) 10 MG tablet Take 10 mg by mouth daily.     MAGNESIUM PO Take 200  mg by mouth at bedtime.     Multiple Vitamin (MULTIVITAMIN) capsule Take 1 capsule by mouth daily.     nystatin (MYCOSTATIN) 100000 UNIT/ML suspension Apply to corners of mouth three x daily 60 mL 1   Omega-3 1000 MG CAPS Take by mouth.     triamcinolone cream (KENALOG) 0.1 % Apply 1 application topically 2 (two) times daily. 80 g 1   TURMERIC PO Take by mouth.     No current facility-administered medications for this visit.    Medication Side Effects: None  Allergies:  Allergies  Allergen Reactions   Doxycycline Swelling   Tetanus Toxoids Other (See Comments)    PAIN IN NECK AND FACE   Flexeril [Cyclobenzaprine] Other (See Comments)    Past Medical History:  Diagnosis Date   H/O fibromyalgia    H/O insomnia    H/O measles    H/O rubella    H/O syncope    History of chicken pox 09/15/2019   History of palpitations    Hx of mumps    Hyperlipidemia    Mitral valve prolapse    Palpitations    Syncope    Von Willebrand disease (HCC)     Past Medical History, Surgical history, Social history, and Family history were reviewed and updated as appropriate.   Please see review of systems for further details on the patient's review from today.   Objective:   Physical Exam:  There were no vitals taken for this visit.  Physical Exam Constitutional:      General: She is not in acute distress. Musculoskeletal:        General: No deformity.  Neurological:     Mental Status: She is alert and oriented to person, place, and time.     Coordination: Coordination normal.  Psychiatric:        Attention and Perception: Attention and perception normal. She does not perceive auditory or visual hallucinations.        Mood and Affect: Mood normal. Mood is not anxious or depressed. Affect is not labile, blunt, angry or inappropriate.        Speech: Speech normal.        Behavior: Behavior normal.        Thought Content: Thought content normal. Thought content is not paranoid or  delusional. Thought content does not include homicidal or suicidal ideation. Thought content does not include homicidal or suicidal plan.        Cognition and Memory: Cognition normal.        Judgment: Judgment normal.     Comments: Insight intact Speech is more fluid. Significantly less difficulty with word finding.      Lab Review:     Component Value Date/Time   NA 140 01/25/2021 1504  K 4.0 01/25/2021 1504   CL 106 01/25/2021 1504   CO2 25 01/25/2021 1504   GLUCOSE 88 01/25/2021 1504   BUN 17 01/25/2021 1504   CREATININE 0.88 01/25/2021 1504   CREATININE 0.97 (H) 01/10/2020 1358   CALCIUM 10.0 01/25/2021 1504   PROT 6.9 01/25/2021 1504   ALBUMIN 4.6 01/25/2021 1504   AST 23 01/25/2021 1504   ALT 15 01/25/2021 1504   ALKPHOS 67 01/25/2021 1504   BILITOT 0.6 01/25/2021 1504       Component Value Date/Time   WBC 4.0 01/25/2021 1504   RBC 4.08 01/25/2021 1504   HGB 12.9 01/25/2021 1504   HCT 38.1 01/25/2021 1504   PLT 272.0 01/25/2021 1504   MCV 93.4 01/25/2021 1504   MCH 31.3 01/10/2020 1358   MCHC 33.9 01/25/2021 1504   RDW 13.1 01/25/2021 1504   LYMPHSABS 1,197 01/10/2020 1358   MONOABS 0.5 10/31/2019 1509   EOSABS 198 01/10/2020 1358   BASOSABS 32 01/10/2020 1358    No results found for: "POCLITH", "LITHIUM"   No results found for: "PHENYTOIN", "PHENOBARB", "VALPROATE", "CBMZ"   .res Assessment: Plan:    Pt seen for 30 minutes and time spent discussing response to switch from Prozac to Lexapro. Will continue Lexapro 10 mg po qd for anxiety and depression since she reports an improvement in mood, anxiety, and cognition since starting Lexapro.  Will continue Diazepam 2.5 mg po QHS for insomnia and anxiety.  Recommend continuing therapy with Bambi Cottle, LCSW.  Pt to follow-up in 2 months or sooner if clinically indicated.  Patient advised to contact office with any questions, adverse effects, or acute worsening in signs and symptoms.   Sweetie was seen  today for follow-up.  Diagnoses and all orders for this visit:  PTSD (post-traumatic stress disorder) -     escitalopram (LEXAPRO) 10 MG tablet; Take 1 tablet (10 mg total) by mouth daily. Take 1/2 tablet daily for one week, then increase to 1 tablet daily  Depression, unspecified depression type -     escitalopram (LEXAPRO) 10 MG tablet; Take 1 tablet (10 mg total) by mouth daily. Take 1/2 tablet daily for one week, then increase to 1 tablet daily     Please see After Visit Summary for patient specific instructions.  Future Appointments  Date Time Provider Department Center  03/10/2022  1:00 PM Cottle, Lynnell Dike, LCSW [facility] None  03/24/2022  1:00 PM Cottle, Bambi G, LCSW [facility] None  03/31/2022  1:00 PM Cottle, Bambi G, LCSW [facility] None  04/07/2022  1:00 PM Cottle, Bambi G, LCSW [facility] None  04/29/2022  1:15 PM Corie Chiquito, PMHNP [facility] None  05/16/2022  3:00 PM Bradd Canary, MD [facility]  07/06/2022  8:30 AM Rosann Auerbach, PhD [facility] None  07/06/2022  9:30 AM LBN- NEUROPSYCH TECH [facility] None  07/12/2022  1:15 PM Glean Salvo, NP [facility] None  07/18/2022  2:30 PM Rosann Auerbach, PhD [facility] None    No orders of the defined types were placed in this encounter.   -------------------------------

## 2022-03-03 ENCOUNTER — Ambulatory Visit: Payer: Medicare Other | Admitting: Psychology

## 2022-03-10 ENCOUNTER — Ambulatory Visit (INDEPENDENT_AMBULATORY_CARE_PROVIDER_SITE_OTHER): Payer: Medicare Other | Admitting: Psychology

## 2022-03-10 DIAGNOSIS — F4310 Post-traumatic stress disorder, unspecified: Secondary | ICD-10-CM | POA: Diagnosis not present

## 2022-03-10 NOTE — Progress Notes (Signed)
[facility] Counselor/Therapist Progress Note  Patient ID: Dawn Simmons, MRN: [id number],    Date: 03/10/2022  Time Spent: 60 minutes  Treatment Type: Individual Therapy  Reported Symptoms: anxiety and depression and memory loss  Mental Status Exam: Appearance:  Casual     Behavior: Appropriate  Motor: Normal  Speech/Language:  Normal Rate  Affect: Blunt  Mood: pleasant  Thought process: normal  Thought content:   WNL  Sensory/Perceptual disturbances:   WNL  Orientation: oriented to person, place, time/date, and situation  Attention: Good  Concentration: Good  Memory: WNL  Fund of knowledge:  Good  Insight:   Good  Judgment:  Good  Impulse Control: Good   Risk Assessment: Danger to Self:  No Self-injurious Behavior: No Danger to Others: No Duty to Warn:no Physical Aggression / Violence:No  Access to Firearms a concern: No  Gang Involvement:No   Subjective: The patient attended a face-to-face individual therapy session in the office today.  The patient presents with a blunted affect and mood is pleasant.  The patient reports that she continues to feel better and better on the new medication that she is on.  The patient states that she had a very nice Thanksgiving with her family and felt good about this.  She seems to be doing much better with her memory and also with just coping in general.  She does have a tendency to do all or nothing thinking at times but seems to be getting better about this as well.  She and her husband are going on a trip next week so we will not have a session next week and we will decide at the following session as to whether she would like to go to an every 2-week schedule since she is doing so well.   Interventions: Cognitive Behavioral Therapy and Eye Movement Desensitization and Reprocessing (EMDR)  Diagnosis:PTSD (post-traumatic stress disorder)  Plan: Treatment Plan  Strengths/Abilities:Insightful, motivated, supportive  husband  Treatment Preferences: Outpatient Individual therapy  Statement of Needs: "I have a problem passing out and I was told I have PTSD"    Symptoms:  Demonstrates an exaggerated startle response.:(Status: improved). Depressed  or irritable mood.: (Status: improved). Describes a reliving of the event,  particularly through dissociative flashbacks.:  (Status: improved). Displays a  significant decline in interest and engagement in activities.: (Status: improved). Displays significant psychological and/or physiological distress resulting from internal and external  clues that are reminiscent of the traumatic event.: (Status: improved).  Experiences disturbances in sleep.: (Status: improved). Experiences disturbing  and persistent thoughts, images, and/or perceptions of the traumatic event.: (Status: improved). Experiences frequent nightmares.: (Status: improved).  Feelings of hopelessness, worthlessness, or inappropriate guilt.: (Status:  improved). Has been exposed to a traumatic event involving actual or perceived threat of death or  serious injury.: (Status: maintained). Impairment in social, occupational, or  other areas of functioning.: (Status: improved). Intentionally avoids activities,  places, people, or objects (e.g., up-armored vehicles) that evoke memories of the event.:(Status: maintained). Intentionally avoids thoughts, feelings, or discussions related  to the traumatic event.: (Status: improved). Reports difficulty concentrating as  well as feelings of guilt.:  (Status: improved). Reports response of intense fear,  helplessness, or horror to the traumatic event.:  (Status: improved).  Problems Addressed: PTSD   Goals:  LTG:  1. Develop healthy thinking patterns and beliefs about self, others, and the world that lead to the alleviation and help prevent the relapse of  depression.  60% Objective Identify and replace thoughts and beliefs  that support depression.  60  % Target Date: 2022-05-01 Frequency: Weekly Progress: 50 Modality: individual 2. Eliminate or reduce the negative impact trauma related symptoms have  on social, occupational, and family functioning.  50% Objective Learn and implement personal skills to manage challenging situations related to trauma. Target Date: 2022-05-01 Frequency: Weekly Progress: 30 Modality: individual3. No longer avoids persons, places, activities, and objects that are  reminiscent of the traumatic event. Objective 3.  Participate in Eye Movement Desensitization and Reprocessing (EMDR) to reduce emotional distress  related to traumatic thoughts, feelings, and images. Target Date: 2022-05-01 Frequency: Weekly Progress: 40 Modality: individual 4.  Learn and implement guided self-dialogue to manage thoughts, feelings, and urges brought on by  encounters with trauma-related situations. Target Date: 2022-05-01 Frequency: Weekly Progress: 40 Modality: individual 5.  No longer experiences intrusive event recollections, avoidance of event  reminders, intense arousal, or disinterest in activities or  relationships. Target Date:05/01/2022  Frequency : Weekly Progress 30    Modality: individual 6.Thinks about or openly discusses the traumatic event with others  without experiencing psychological or physiological distress. Target Date : 05/01/2022 Frequency: Weekly Progress:30   Modality: Individual Interventions by Therapist:  CBT, problem solving therapy, EMDR, insight oriented.  Patient approved Treatment plan.  Toriann Spadoni G Aurilla Coulibaly, LCSW

## 2022-03-17 ENCOUNTER — Ambulatory Visit: Payer: Medicare Other | Admitting: Psychology

## 2022-03-22 ENCOUNTER — Ambulatory Visit: Payer: Medicare Other | Admitting: Family Medicine

## 2022-03-22 ENCOUNTER — Other Ambulatory Visit: Payer: Self-pay | Admitting: Psychiatry

## 2022-03-22 DIAGNOSIS — F32A Depression, unspecified: Secondary | ICD-10-CM

## 2022-03-22 DIAGNOSIS — F4310 Post-traumatic stress disorder, unspecified: Secondary | ICD-10-CM

## 2022-03-24 ENCOUNTER — Ambulatory Visit (INDEPENDENT_AMBULATORY_CARE_PROVIDER_SITE_OTHER): Payer: Medicare Other | Admitting: Psychology

## 2022-03-24 ENCOUNTER — Ambulatory Visit: Payer: Medicare Other | Admitting: Family Medicine

## 2022-03-24 DIAGNOSIS — F32A Depression, unspecified: Secondary | ICD-10-CM

## 2022-03-24 DIAGNOSIS — F4310 Post-traumatic stress disorder, unspecified: Secondary | ICD-10-CM | POA: Diagnosis not present

## 2022-03-24 NOTE — Progress Notes (Signed)
[facility] Counselor/Therapist Progress Note  Patient ID: Colbie Sliker, MRN: [id number],    Date: 03/24/2022  Time Spent: 60 minutes  Treatment Type: Individual Therapy  Reported Symptoms: anxiety and depression and memory loss  Mental Status Exam: Appearance:  Casual     Behavior: Appropriate  Motor: Normal  Speech/Language:  Normal Rate  Affect: Blunt  Mood: pleasant  Thought process: normal  Thought content:   WNL  Sensory/Perceptual disturbances:   WNL  Orientation: oriented to person, place, time/date, and situation  Attention: Good  Concentration: Good  Memory: WNL  Fund of knowledge:  Good  Insight:   Good  Judgment:  Good  Impulse Control: Good   Risk Assessment: Danger to Self:  No Self-injurious Behavior: No Danger to Others: No Duty to Warn:no Physical Aggression / Violence:No  Access to Firearms a concern: No  Gang Involvement:No   Subjective: The patient attended a face-to-face individual therapy session in the office today.  The patient presents with a blunted affect and mood is pleasant.  The patient reports that she did not end up going on the trip up [place] with her husband because her sister got COVID and her husband got COVID.  She talked about still feeling good and feeling like the medication that she is on currently is working [employer].  She talked about what they were going to do for Christmas and they seem that she seems to be doing very well with her memory.  She did have a few episodes today where she lost her train of thought but does seem to be remembering things much better.  We talked about her going to every other week for therapy and she was agreeable to this.  We will change this in the computer and she is doing very well.  Interventions: Cognitive Behavioral Therapy and Eye Movement Desensitization and Reprocessing (EMDR)  Diagnosis:PTSD (post-traumatic stress disorder)  Depression, unspecified depression type  Plan:  Treatment Plan  Strengths/Abilities:Insightful, motivated, supportive husband  Treatment Preferences: Outpatient Individual therapy  Statement of Needs: "I have a problem passing out and I was told I have PTSD"    Symptoms:  Demonstrates an exaggerated startle response.:(Status: improved). Depressed  or irritable mood.: (Status: improved). Describes a reliving of the event,  particularly through dissociative flashbacks.:  (Status: improved). Displays a  significant decline in interest and engagement in activities.: (Status: improved). Displays significant psychological and/or physiological distress resulting from internal and external  clues that are reminiscent of the traumatic event.: (Status: improved).  Experiences disturbances in sleep.: (Status: improved). Experiences disturbing  and persistent thoughts, images, and/or perceptions of the traumatic event.: (Status: improved). Experiences frequent nightmares.: (Status: improved).  Feelings of hopelessness, worthlessness, or inappropriate guilt.: (Status:  improved). Has been exposed to a traumatic event involving actual or perceived threat of death or  serious injury.: (Status: maintained). Impairment in social, occupational, or  other areas of functioning.: (Status: improved). Intentionally avoids activities,  places, people, or objects (e.g., up-armored vehicles) that evoke memories of the event.:(Status: maintained). Intentionally avoids thoughts, feelings, or discussions related  to the traumatic event.: (Status: improved). Reports difficulty concentrating as  well as feelings of guilt.:  (Status: improved). Reports response of intense fear,  helplessness, or horror to the traumatic event.:  (Status: improved).  Problems Addressed: PTSD   Goals:  LTG:  1. Develop healthy thinking patterns and beliefs about self, others, and the world that lead to the alleviation and help prevent the relapse of  depression.   60%  Objective Identify and replace thoughts and beliefs that support depression.  60 % Target Date: 2022-05-01 Frequency: Weekly Progress: 50 Modality: individual 2. Eliminate or reduce the negative impact trauma related symptoms have  on social, occupational, and family functioning.  50% Objective Learn and implement personal skills to manage challenging situations related to trauma. Target Date: 2022-05-01 Frequency: Weekly Progress: 30 Modality: individual3. No longer avoids persons, places, activities, and objects that are  reminiscent of the traumatic event. Objective 3.  Participate in Eye Movement Desensitization and Reprocessing (EMDR) to reduce emotional distress  related to traumatic thoughts, feelings, and images. Target Date: 2022-05-01 Frequency: Weekly Progress: 40 Modality: individual 4.  Learn and implement guided self-dialogue to manage thoughts, feelings, and urges brought on by  encounters with trauma-related situations. Target Date: 2022-05-01 Frequency: Weekly Progress: 40 Modality: individual 5.  No longer experiences intrusive event recollections, avoidance of event  reminders, intense arousal, or disinterest in activities or  relationships. Target Date:05/01/2022  Frequency : Weekly Progress 30    Modality: individual 6.Thinks about or openly discusses the traumatic event with others  without experiencing psychological or physiological distress. Target Date : 05/01/2022 Frequency: Weekly Progress:30   Modality: Individual Interventions by Therapist:  CBT, problem solving therapy, EMDR, insight oriented.  Patient approved Treatment plan.  Kimbria Camposano G Lopaka Karge, LCSW

## 2022-03-25 DIAGNOSIS — H4921 Sixth [abducent] nerve palsy, right eye: Secondary | ICD-10-CM | POA: Diagnosis not present

## 2022-03-25 DIAGNOSIS — H532 Diplopia: Secondary | ICD-10-CM | POA: Diagnosis not present

## 2022-03-30 NOTE — Assessment & Plan Note (Signed)
Encourage heart healthy diet such as MIND or DASH diet, increase exercise, avoid trans fats, simple carbohydrates and processed foods, consider a krill or fish or flaxseed oil cap daily.  °

## 2022-03-30 NOTE — Assessment & Plan Note (Signed)
Asymptomatic, monitor

## 2022-03-30 NOTE — Assessment & Plan Note (Signed)
Encouraged good sleep hygiene such as dark, quiet room. No blue/green glowing lights such as computer screens in bedroom. No alcohol or stimulants in evening. Cut down on caffeine as able. Regular exercise is helpful but not just prior to bed time.  

## 2022-03-30 NOTE — Assessment & Plan Note (Addendum)
Encouraged to stay active maintain a healthy high antioxidant diet such as Mediterranean diet and learn something new. Daily Multivitamin and fish oil caps. Has had imaging and been evaluated by neurology.

## 2022-03-31 ENCOUNTER — Ambulatory Visit: Payer: Medicare Other | Admitting: Psychology

## 2022-03-31 ENCOUNTER — Ambulatory Visit (INDEPENDENT_AMBULATORY_CARE_PROVIDER_SITE_OTHER): Payer: Medicare Other | Admitting: Family Medicine

## 2022-03-31 VITALS — BP 115/70 | HR 73 | Temp 98.0°F | Resp 16 | Ht 63.0 in | Wt 128.6 lb

## 2022-03-31 DIAGNOSIS — E785 Hyperlipidemia, unspecified: Secondary | ICD-10-CM | POA: Diagnosis not present

## 2022-03-31 DIAGNOSIS — G4700 Insomnia, unspecified: Secondary | ICD-10-CM

## 2022-03-31 DIAGNOSIS — F4310 Post-traumatic stress disorder, unspecified: Secondary | ICD-10-CM | POA: Diagnosis not present

## 2022-03-31 DIAGNOSIS — R413 Other amnesia: Secondary | ICD-10-CM

## 2022-03-31 NOTE — Progress Notes (Signed)
Subjective:   By signing my name below, I, Vickey Sages, attest that this documentation has been prepared under the direction and in the presence of Bradd Canary, MD., 03/31/2022.    Patient ID: Dawn Simmons, female    DOB: 02/24/1946, 76 y.o.   MRN: [id number]  Chief Complaint  Patient presents with   Follow-up    Follow up   HPI Patient is in today for an office visit and is accompanied by her daughter. She denies CP/ palpitations/SOB/HA/congestion/fevers/or GU c/o.  Bowel Habits She reports that her bowels movements have recently decreased. She is not eating much and states that her weight has decreased. Body mass index is 22.78 kg/m. Wt Readings from Last 3 Encounters:  03/31/22 128 lb 9.6 oz (58.3 kg)  02/14/22 126 lb 3.2 oz (57.2 kg)  01/27/22 125 lb 9.6 oz (57 kg)   Fibromyalgia Patient reports the cold weather has been causing body aches. She has been managing this by heating her home. She further reports that her right hip clicks when exercising. She has stopped exercising for the past several days due to right hip pain radiating to her right leg and foot. She has seen sports medicine Dr. [name] in the past and is interested in managing this hip clicking with him.  Memory Loss Patient is following with neurology NP Ihor Austin at [facility]. Currently taking Escitalopram 10 mg which is working well. MRI from 01/31/2022 revealed: -Mild generalized cortical atrophy that is more advanced in the medial temporal lobes, unchanged compared to the 11/05/2021 CT scan but significantly progressed compared to the 06/14/2015 MRI when no significant atrophy was noted.  Although not entirely specific, this pattern is consistent with Alzheimer's disease. -Some T2/FLAIR hyperintense foci in the cerebral white matter consistent with mild chronic microvascular ischemic changes, mildly progressed compared to the 2017 MRI.   -The extent is typical for age. No acute  findings.  Normal enhancement pattern.   PTSD Her cat was killed several months ago which worsened her emotional state. She is doing  better today.  Past Medical History:  Diagnosis Date   H/O fibromyalgia    H/O insomnia    H/O measles    H/O rubella    H/O syncope    History of chicken pox 09/15/2019   History of palpitations    Hx of mumps    Hyperlipidemia    Mitral valve prolapse    Palpitations    Syncope    Von Willebrand disease (HCC)    Past Surgical History:  Procedure Laterality Date   NO PAST SURGERIES     Family History  Problem Relation Age of Onset   Stroke Mother    Emphysema Father        smoked   Alcohol abuse Father    Heart attack Maternal Grandfather    Heart attack Paternal Grandfather    Heart attack Maternal Uncle    Heart failure Maternal Grandmother    Heart failure Paternal Grandmother    OCD Son    Drug abuse Son    Seizures Neg Hx    Social History   Socioeconomic History   Marital status: Married    Spouse name: tony   Number of children: 4   Years of education: 12   Highest education level: Not on file  Occupational History   Not on file  Tobacco Use   Smoking status: Former    Packs/day: 0.50    Years: 10.00  Total pack years: 5.00    Types: Cigarettes    Quit date: 04/27/1972    Years since quitting: 49.9   Smokeless tobacco: Never  Vaping Use   Vaping Use: Never used  Substance and Sexual Activity   Alcohol use: Yes    Alcohol/week: 0.0 standard drinks of alcohol    Comment: occ 1 glass wine or beer   Drug use: No   Sexual activity: Not on file  Other Topics Concern   Not on file  Social History Narrative   Lives at home with husband   Caffeine use- tea, 1 cup/day   Social Determinants of Health   Financial Resource Strain: Low Risk  (12/13/2019)   Overall Financial Resource Strain (CARDIA)    Difficulty of Paying Living Expenses: Not hard at all  Food Insecurity: No Food Insecurity (12/13/2019)   Hunger  Vital Sign    Worried About Running Out of Food in the Last Year: Never true    Ran Out of Food in the Last Year: Never true  Transportation Needs: No Transportation Needs (02/08/2021)   PRAPARE - Administrator, Civil Service (Medical): No    Lack of Transportation (Non-Medical): No  Physical Activity: Inactive (02/08/2021)   Exercise Vital Sign    Days of Exercise per Week: 0 days    Minutes of Exercise per Session: 0 min  Stress: Not on file  Social Connections: Not on file  Intimate Partner Violence: Not At Risk (02/08/2021)   Humiliation, Afraid, Rape, and Kick questionnaire    Fear of Current or Ex-Partner: No    Emotionally Abused: No    Physically Abused: No    Sexually Abused: No   Outpatient Medications Prior to Visit  Medication Sig Dispense Refill   Ascorbic Acid (VITAMIN C) 100 MG tablet Take 100 mg by mouth daily.     Calcium-Magnesium 200-100 MG TABS Take 4 tablets by mouth daily. 2 tablets in the morning and 2 tablets in the evening     diazepam (VALIUM) 5 MG tablet Take 1 tablet (5 mg total) by mouth at bedtime. Take 1/2-1 tablet po QHS 90 tablet 0   ergocalciferol (DRISDOL) 200 MCG/ML drops      escitalopram (LEXAPRO) 10 MG tablet Take 1 tablet (10 mg total) by mouth daily. 30 tablet 0   loratadine (CLARITIN) 10 MG tablet Take 10 mg by mouth daily.     MAGNESIUM PO Take 200 mg by mouth at bedtime.     Multiple Vitamin (MULTIVITAMIN) capsule Take 1 capsule by mouth daily.     nystatin (MYCOSTATIN) 100000 UNIT/ML suspension Apply to corners of mouth three x daily 60 mL 1   Omega-3 1000 MG CAPS Take by mouth.     triamcinolone cream (KENALOG) 0.1 % Apply 1 application topically 2 (two) times daily. 80 g 1   TURMERIC PO Take by mouth.     No facility-administered medications prior to visit.   Allergies  Allergen Reactions   Doxycycline Swelling   Tetanus Toxoids Other (See Comments)    PAIN IN NECK AND FACE   Flexeril [Cyclobenzaprine] Other (See  Comments)   Review of Systems  Constitutional:  Negative for chills and fever.  HENT:  Negative for congestion.   Respiratory:  Negative for shortness of breath.   Cardiovascular:  Negative for chest pain and palpitations.  Gastrointestinal:  Negative for abdominal pain, blood in stool, constipation, diarrhea, nausea and vomiting.  Genitourinary:  Negative for dysuria, frequency, hematuria and  urgency.  Musculoskeletal:  Positive for joint pain (right hip).  Skin:           Neurological:  Negative for headaches.  Psychiatric/Behavioral:  Positive for memory loss.       Objective:    Physical Exam Constitutional:      General: She is not in acute distress.    Appearance: Normal appearance. She is normal weight. She is not ill-appearing.  HENT:     Head: Normocephalic and atraumatic.     Right Ear: External ear normal.     Left Ear: External ear normal.     Nose: Nose normal.     Mouth/Throat:     Mouth: Mucous membranes are moist.     Pharynx: Oropharynx is clear.  Eyes:     General:        Right eye: No discharge.        Left eye: No discharge.     Extraocular Movements: Extraocular movements intact.     Conjunctiva/sclera: Conjunctivae normal.     Pupils: Pupils are equal, round, and reactive to light.  Cardiovascular:     Rate and Rhythm: Normal rate and regular rhythm.     Pulses: Normal pulses.     Heart sounds: Normal heart sounds. No murmur heard.    No gallop.  Pulmonary:     Effort: Pulmonary effort is normal. No respiratory distress.     Breath sounds: Normal breath sounds. No wheezing or rales.  Abdominal:     General: Bowel sounds are normal.     Palpations: Abdomen is soft.     Tenderness: There is no abdominal tenderness. There is no guarding.  Musculoskeletal:        General: Normal range of motion.     Cervical back: Normal range of motion.     Right lower leg: No edema.     Left lower leg: No edema.  Skin:    General: Skin is warm and dry.   Neurological:     Mental Status: She is alert and oriented to person, place, and time.  Psychiatric:        Mood and Affect: Mood normal.        Behavior: Behavior normal.        Judgment: Judgment normal.    BP 115/70 (BP Location: Right Arm, Patient Position: Sitting, Cuff Size: Normal)   Pulse 73   Temp 98 F (36.7 C) (Oral)   Resp 16   Ht 5\' 3"  (1.6 m)   Wt 128 lb 9.6 oz (58.3 kg)   SpO2 98%   BMI 22.78 kg/m  Wt Readings from Last 3 Encounters:  03/31/22 128 lb 9.6 oz (58.3 kg)  02/14/22 126 lb 3.2 oz (57.2 kg)  01/27/22 125 lb 9.6 oz (57 kg)   Diabetic Foot Exam - Simple   No data filed    Lab Results  Component Value Date   WBC 4.0 01/25/2021   HGB 12.9 01/25/2021   HCT 38.1 01/25/2021   PLT 272.0 01/25/2021   GLUCOSE 88 01/25/2021   CHOL 227 (H) 11/03/2021   TRIG 189.0 (H) 11/03/2021   HDL 62.50 11/03/2021   LDLCALC 127 (H) 11/03/2021   ALT 15 01/25/2021   AST 23 01/25/2021   NA 140 01/25/2021   K 4.0 01/25/2021   CL 106 01/25/2021   CREATININE 0.88 01/25/2021   BUN 17 01/25/2021   CO2 25 01/25/2021   TSH 2.450 01/06/2022   Lab Results  Component Value  Date   TSH 2.450 01/06/2022   Lab Results  Component Value Date   WBC 4.0 01/25/2021   HGB 12.9 01/25/2021   HCT 38.1 01/25/2021   MCV 93.4 01/25/2021   PLT 272.0 01/25/2021   Lab Results  Component Value Date   NA 140 01/25/2021   K 4.0 01/25/2021   CO2 25 01/25/2021   GLUCOSE 88 01/25/2021   BUN 17 01/25/2021   CREATININE 0.88 01/25/2021   BILITOT 0.6 01/25/2021   ALKPHOS 67 01/25/2021   AST 23 01/25/2021   ALT 15 01/25/2021   PROT 6.9 01/25/2021   ALBUMIN 4.6 01/25/2021   CALCIUM 10.0 01/25/2021   GFR 64.86 01/25/2021   Lab Results  Component Value Date   CHOL 227 (H) 11/03/2021   Lab Results  Component Value Date   HDL 62.50 11/03/2021   Lab Results  Component Value Date   LDLCALC 127 (H) 11/03/2021   Lab Results  Component Value Date   TRIG 189.0 (H) 11/03/2021    Lab Results  Component Value Date   CHOLHDL 4 11/03/2021   No results found for: "HGBA1C"     Assessment & Plan:  Immunizations: Encouraged RSV, Shingles, and Tetanus (if injured) immunizations.  Right Hip Clicking: Recommended that patient sees Dr. Denyse Amassorey if right hip clicking continues.  Memory Changes: Encouraged adequate sleep, exercise, heart healthy diet, hydration, and learning something new. Recommended multivitamins and fish oil capsules.  Problem List Items Addressed This Visit     Insomnia    Encouraged good sleep hygiene such as dark, quiet room. No blue/green glowing lights such as computer screens in bedroom. No alcohol or stimulants in evening. Cut down on caffeine as able. Regular exercise is helpful but not just prior to bed time.        PTSD (post-traumatic stress disorder)    Continues to follow with counselor and psych. No changes      Memory changes    Encouraged to stay active maintain a healthy high antioxidant diet such as Mediterranean diet and learn something new. Daily Multivitamin and fish oil caps. Has had imaging and been evaluated by neurology.       Hyperlipidemia - Primary    Encourage heart healthy diet such as MIND or DASH diet, increase exercise, avoid trans fats, simple carbohydrates and processed foods, consider a krill or fish or flaxseed oil cap daily.        No orders of the defined types were placed in this encounter.  I, Danise EdgeStacey Tyla Burgner, MD, personally preformed the services described in this documentation.  All medical record entries made by the scribe were at my direction and in my presence.  I have reviewed the chart and discharge instructions (if applicable) and agree that the record reflects my personal performance and is accurate and complete. 03/31/2022  I,Mohammed Iqbal,acting as a scribe for Danise EdgeStacey Onelia Cadmus, MD.,have documented all relevant documentation on the behalf of Danise EdgeStacey Rodrigues Urbanek, MD,as directed by  Danise EdgeStacey Otis Portal, MD while in the  presence of Danise EdgeStacey Dilraj Killgore, MD.  Danise EdgeStacey Grant Swager, MD

## 2022-03-31 NOTE — Patient Instructions (Addendum)
Tetanus at your discretion Shingrix is the new shingles shot, 2 shots over 2-6 months, confirm coverage with insurance and document, then can return here for shots with nurse appt or at pharmacy  RSV, Respiratory Syncitial Virus, Arexvy at pharmacy   Hip Pain The hip is the joint between the upper legs and the lower pelvis. The bones, cartilage, tendons, and muscles of your hip joint support your body and allow you to move around. Hip pain can range from a minor ache to severe pain in one or both of your hips. The pain may be felt on the inside of the hip joint near the groin, or on the outside near the buttocks and upper thigh. You may also have swelling or stiffness in your hip area. Follow these instructions at home: Managing pain, stiffness, and swelling     If directed, put ice on the painful area. To do this: Put ice in a plastic bag. Place a towel between your skin and the bag. Leave the ice on for 20 minutes, 2-3 times a day. If directed, apply heat to the affected area as often as told by your health care provider. Use the heat source that your health care provider recommends, such as a moist heat pack or a heating pad. Place a towel between your skin and the heat source. Leave the heat on for 20-30 minutes. Remove the heat if your skin turns bright red. This is especially important if you are unable to feel pain, heat, or cold. You may have a greater risk of getting burned. Activity Do exercises as told by your health care provider. Avoid activities that cause pain. General instructions  Take over-the-counter and prescription medicines only as told by your health care provider. Keep a journal of your symptoms. Write down: How often you have hip pain. The location of your pain. What the pain feels like. What makes the pain worse. Sleep with a pillow between your legs on your most comfortable side. Keep all follow-up visits as told by your health care provider. This is  important. Contact a health care provider if: You cannot put weight on your leg. Your pain or swelling continues or gets worse after one week. It gets harder to walk. You have a fever. Get help right away if: You fall. You have a sudden increase in pain and swelling in your hip. Your hip is red or swollen or very tender to touch. Summary Hip pain can range from a minor ache to severe pain in one or both of your hips. The pain may be felt on the inside of the hip joint near the groin, or on the outside near the buttocks and upper thigh. Avoid activities that cause pain. Write down how often you have hip pain, the location of the pain, what makes it worse, and what it feels like. This information is not intended to replace advice given to you by your health care provider. Make sure you discuss any questions you have with your health care provider. Document Revised: 08/13/2018 Document Reviewed: 08/13/2018 Elsevier Patient Education  2023 [employer].

## 2022-04-05 NOTE — Assessment & Plan Note (Signed)
Continues to follow with counselor and psych. No changes

## 2022-04-07 ENCOUNTER — Ambulatory Visit (INDEPENDENT_AMBULATORY_CARE_PROVIDER_SITE_OTHER): Payer: Medicare Other | Admitting: Psychology

## 2022-04-07 DIAGNOSIS — F4310 Post-traumatic stress disorder, unspecified: Secondary | ICD-10-CM | POA: Diagnosis not present

## 2022-04-08 NOTE — Progress Notes (Signed)
[facility] Counselor/Therapist Progress Note  Patient ID: Makenly Larabee, MRN: [id number],    Date: 04/07/2022  Time Spent: 60 minutes  Treatment Type: Individual Therapy  Reported Symptoms: anxiety and depression and memory loss  Mental Status Exam: Appearance:  Casual     Behavior: Appropriate  Motor: Normal  Speech/Language:  Normal Rate  Affect: Blunt  Mood: pleasant  Thought process: normal  Thought content:   WNL  Sensory/Perceptual disturbances:   WNL  Orientation: oriented to person, place, time/date, and situation  Attention: Good  Concentration: Good  Memory: WNL  Fund of knowledge:  Good  Insight:   Good  Judgment:  Good  Impulse Control: Good   Risk Assessment: Danger to Self:  No Self-injurious Behavior: No Danger to Others: No Duty to Warn:no Physical Aggression / Violence:No  Access to Firearms a concern: No  Gang Involvement:No   Subjective: The patient attended a face-to-face individual therapy session in the office today.  The patient presents with a blunted affect and mood is pleasant.  The patient reports that things have been going well.  It appears that she does have some issues with her short-term memory still.  She is having difficulty recalling names.  The patient states that they had a good Christmas and it seems that she seems to be handling things much better and not reacting as much as she did previously.  The patient is now on an every 2-week schedule and we will continue this to see how she does.  She is talking about possibly getting a dog and she is doing some dancing but not a lot right now.  I encouraged her to continue to do those things that make her happy and also that are good for her brain.   Interventions: Cognitive Behavioral Therapy and Eye Movement Desensitization and Reprocessing (EMDR)  Diagnosis:PTSD (post-traumatic stress disorder)  Plan: Treatment Plan  Strengths/Abilities:Insightful, motivated,  supportive husband  Treatment Preferences: Outpatient Individual therapy  Statement of Needs: "I have a problem passing out and I was told I have PTSD"    Symptoms:  Demonstrates an exaggerated startle response.:(Status: improved). Depressed  or irritable mood.: (Status: improved). Describes a reliving of the event,  particularly through dissociative flashbacks.:  (Status: improved). Displays a  significant decline in interest and engagement in activities.: (Status: improved). Displays significant psychological and/or physiological distress resulting from internal and external  clues that are reminiscent of the traumatic event.: (Status: improved).  Experiences disturbances in sleep.: (Status: improved). Experiences disturbing  and persistent thoughts, images, and/or perceptions of the traumatic event.: (Status: improved). Experiences frequent nightmares.: (Status: improved).  Feelings of hopelessness, worthlessness, or inappropriate guilt.: (Status:  improved). Has been exposed to a traumatic event involving actual or perceived threat of death or  serious injury.: (Status: maintained). Impairment in social, occupational, or  other areas of functioning.: (Status: improved). Intentionally avoids activities,  places, people, or objects (e.g., up-armored vehicles) that evoke memories of the event.:(Status: maintained). Intentionally avoids thoughts, feelings, or discussions related  to the traumatic event.: (Status: improved). Reports difficulty concentrating as  well as feelings of guilt.:  (Status: improved). Reports response of intense fear,  helplessness, or horror to the traumatic event.:  (Status: improved).  Problems Addressed: PTSD   Goals:  LTG:  1. Develop healthy thinking patterns and beliefs about self, others, and the world that lead to the alleviation and help prevent the relapse of  depression.  70% Objective Identify and replace thoughts and beliefs that support  depression.  70 % Target Date: 2022-05-01 Frequency: biWeekly Progress: 60 Modality: individual 2. Eliminate or reduce the negative impact trauma related symptoms have  on social, occupational, and family functioning.  60% Objective Learn and implement personal skills to manage challenging situations related to trauma. Target Date: 2022-05-01 Frequency: biWeekly Progress: 40 Modality: individual3. No longer avoids persons, places, activities, and objects that are  reminiscent of the traumatic event. Objective 3.  Participate in Eye Movement Desensitization and Reprocessing (EMDR) to reduce emotional distress  related to traumatic thoughts, feelings, and images. Target Date: 2022-05-01 Frequency: biWeekly Progress: 60 Modality: individual 4.  Learn and implement guided self-dialogue to manage thoughts, feelings, and urges brought on by  encounters with trauma-related situations. Target Date: 2022-05-01 Frequency: biWeekly Progress: 50 Modality: individual 5.  No longer experiences intrusive event recollections, avoidance of event  reminders, intense arousal, or disinterest in activities or  relationships. Target Date:05/01/2022  Frequency : biWeekly Progress 60    Modality: individual 6.Thinks about or openly discusses the traumatic event with others  without experiencing psychological or physiological distress. Target Date : 05/01/2022 Frequency: biWeekly Progress:30   Modality: Individual Interventions by Therapist:  CBT, problem solving therapy, EMDR, insight oriented.  Patient approved Treatment plan.  Lailyn Appelbaum G Traquan Duarte, LCSW

## 2022-04-14 ENCOUNTER — Ambulatory Visit: Payer: Medicare Other | Admitting: Psychology

## 2022-04-21 ENCOUNTER — Ambulatory Visit: Payer: Medicare Other | Admitting: Psychology

## 2022-04-28 ENCOUNTER — Ambulatory Visit: Payer: Medicare Other | Admitting: Psychology

## 2022-04-29 ENCOUNTER — Ambulatory Visit: Payer: Medicare Other | Admitting: Psychiatry

## 2022-05-02 ENCOUNTER — Encounter: Payer: Self-pay | Admitting: Psychology

## 2022-05-02 DIAGNOSIS — F445 Conversion disorder with seizures or convulsions: Secondary | ICD-10-CM | POA: Insufficient documentation

## 2022-05-02 DIAGNOSIS — G2581 Restless legs syndrome: Secondary | ICD-10-CM | POA: Insufficient documentation

## 2022-05-02 DIAGNOSIS — E559 Vitamin D deficiency, unspecified: Secondary | ICD-10-CM | POA: Insufficient documentation

## 2022-05-02 DIAGNOSIS — K130 Diseases of lips: Secondary | ICD-10-CM | POA: Insufficient documentation

## 2022-05-02 DIAGNOSIS — D509 Iron deficiency anemia, unspecified: Secondary | ICD-10-CM | POA: Insufficient documentation

## 2022-05-02 DIAGNOSIS — J309 Allergic rhinitis, unspecified: Secondary | ICD-10-CM | POA: Insufficient documentation

## 2022-05-02 DIAGNOSIS — R5382 Chronic fatigue, unspecified: Secondary | ICD-10-CM | POA: Insufficient documentation

## 2022-05-02 DIAGNOSIS — F13930 Sedative, hypnotic or anxiolytic use, unspecified with withdrawal, uncomplicated: Secondary | ICD-10-CM | POA: Insufficient documentation

## 2022-05-02 DIAGNOSIS — M792 Neuralgia and neuritis, unspecified: Secondary | ICD-10-CM | POA: Insufficient documentation

## 2022-05-02 DIAGNOSIS — M5412 Radiculopathy, cervical region: Secondary | ICD-10-CM | POA: Insufficient documentation

## 2022-05-02 DIAGNOSIS — J029 Acute pharyngitis, unspecified: Secondary | ICD-10-CM | POA: Insufficient documentation

## 2022-05-03 ENCOUNTER — Ambulatory Visit: Payer: Medicare Other | Admitting: Psychology

## 2022-05-03 ENCOUNTER — Ambulatory Visit (INDEPENDENT_AMBULATORY_CARE_PROVIDER_SITE_OTHER): Payer: Medicare Other | Admitting: Psychology

## 2022-05-03 ENCOUNTER — Encounter: Payer: Self-pay | Admitting: Psychology

## 2022-05-03 DIAGNOSIS — F4310 Post-traumatic stress disorder, unspecified: Secondary | ICD-10-CM | POA: Diagnosis not present

## 2022-05-03 DIAGNOSIS — F445 Conversion disorder with seizures or convulsions: Secondary | ICD-10-CM

## 2022-05-03 DIAGNOSIS — G3184 Mild cognitive impairment, so stated: Secondary | ICD-10-CM

## 2022-05-03 DIAGNOSIS — R4189 Other symptoms and signs involving cognitive functions and awareness: Secondary | ICD-10-CM

## 2022-05-03 HISTORY — DX: Mild cognitive impairment of uncertain or unknown etiology: G31.84

## 2022-05-03 NOTE — Progress Notes (Unsigned)
NEUROPSYCHOLOGICAL EVALUATION [place] [place] [facility] of Neurology  Date of Evaluation: May 03, 2032  Reason for Referral:   Dawn Simmons is a 77 y.o. right-handed Caucasian female referred by  Lynne Leader, M.D. 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   Assessment and Plan:   Clinical Impression(s): Dawn Simmons pattern of performance is suggestive of profound impairment surrounding all aspects of learning and memory. Further variability and/or weakness was exhibited across cognitive flexibility, semantic fluency, confrontation naming, and visuospatial abilities. Performances were appropriate relative to age-matched peers across processing speed, basic attention, and phonemic fluency. Functionally, Dawn Simmons denied difficulties completing instrumental activities of daily living (ADLs) independently. Her husband was present and did not contradict her reporting. As such, given evidence for cognitive dysfunction described above, she meets criteria for a Mild Neurocognitive Disorder ("mild cognitive impairment"). However, given the severity of ongoing impairments, she is certainly towards the severe end of this spectrum and at risk of transitioning to a dementia designation over the next several years.   Regarding etiology, I unfortunately have concerns surrounding the presence of a neurodegenerative illness, namely Alzheimer's disease. Across memory testing, Dawn Simmons did not benefit from repeated exposure to new information, was fully amnestic (i.e., 0% retention) after a brief delay across all memory tasks, and performed very poorly across yes/no recognition trials. Taken together, this suggests rapid forgetting and a severe memory storage impairment, both of which represent the  hallmark characteristics of this illness. Further variability and weakness in semantic fluency, confrontation naming, and visuospatial abilities would follow typical disease progression, also elevating concerns. Recent neuroimaging (which was not available at the time of her previous neuropsychological evaluation) revealed more pronounced atrophy in the medial temporal lobes, which is a well established risk factor for the presence of this disease process.   There is undoubtedly a psychiatric component to her current presentation. 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 It is important to highlight than even in instances of high stress and psychiatric confounds, fully amnestic memory and impaired recognition is above and beyond typical testing patterns. These experiences (as well as her COVID-19 vs pneumonia experience in in November 2019) would also not cause medial temporal lobe brain atrophy in a typical Alzheimer's disease pattern. Overall, while there is very likely a psychiatric contribution, I do not believe that this fully accounts for ongoing cognitive decline on its own. Continued medical monitoring will be important moving forward.   Recommendations: A repeat neuropsychological evaluation in 12-18 months (or sooner if functional decline is noted) is recommended to assess the trajectory of future cognitive decline should it occur. This will also aid in future efforts towards improved diagnostic clarity.  I would recommend that Dawn Simmons speak to her neurologist regarding medications aimed to address memory loss and concerns surrounding Alzheimer's disease. It is important to highlight that these medications have  been shown to slow functional decline in some individuals. There is no current treatment which can stop or reverse cognitive decline when caused by a neurodegenerative illness.   She is strongly encouraged to maintain current relationships with her therapist and psychiatrist to ensure that psychiatric  symptoms are managed in the most effective way possible. Therapeutic processes may need to be adjusted given the extent of ongoing memory impairment.  Performance across neurocognitive testing is not a strong predictor of an individual's safety operating a motor vehicle. Should her family wish to pursue a formalized driving evaluation, they could reach out to the following agencies: [employer] in [place]: [phone] [facility]: [phone] [facility]: [phone] [facility]: [phone] or [phone]  Should there be further progression of current deficits over time, she is unlikely to regain any independent living skills lost. Therefore, it is recommended that she remain as involved as possible in all aspects of household chores, finances, and medication management, with supervision to ensure adequate performance. She will likely benefit from the establishment and maintenance of a routine in order to maximize her functional abilities over time.  It will be important for Dawn Simmons to have another person with her when in situations where she may need to process information, weigh the pros and cons of different options, and make decisions, in order to ensure that she fully understands and recalls all information to be considered.  Dawn Simmons is encouraged to attend to lifestyle factors for brain health (e.g., regular physical exercise, good nutrition habits, regular participation in cognitively-stimulating activities, and general stress management techniques), which are likely to have benefits for both emotional adjustment and cognition. In fact, in  addition to promoting good general health, regular exercise incorporating aerobic activities (e.g., brisk walking, jogging, cycling, etc.) has been demonstrated to be a very effective treatment for depression and stress, with similar efficacy rates to both antidepressant medication and psychotherapy. Optimal control of vascular risk factors (including safe cardiovascular exercise and adherence to dietary recommendations) is encouraged. Continued participation in activities which provide mental stimulation and social interaction is also recommended.   Important information should be provided to Ms. Orrison in written format in all instances. This information should be placed in a highly frequented and easily visible location within her home to promote recall. External strategies such as written notes in a consistently used memory journal, visual and nonverbal auditory cues such as a calendar on the refrigerator or appointments with alarm, such as on a cell phone, can also help maximize recall.  Review of Records:   Dawn Simmons [facility] Naomie Dean(Antonia Ahern, M.D.) on 06/01/2015 for an evaluation of syncopal episodes. Her first episode was reportedly in June 2016. She experienced dizziness, laid on the floor, and subsequently vomited. She was sick in November 2016, given a z-pack, and experienced several very brief losses in consciousness. Concern was expressed surrounding panic attacks and she was prescribed Valium. No episodes occurred until January 2017 in the context of an upper respiratory illness. Triggers were said to include elevated stress. She described some visual experiences like colorful circles and wavy lines during some experiences. Brain MRI on 06/15/2015 was unremarkable. EEG on 06/22/2015 was normal.   She was seen for an outpatient speech language pathology evaluation Sherron Ales(Sydney Renette [facility], [facility]) on 09/15/2021. At that time, cognitive dysfunction was said to first become  noticeable following a suspected COVID-19 infection. Dawn Simmons was noted to require repetition and redirection due to poor topic maintenance. Objective impairments were said to include attention/concentration and memory. She was discharged from speech therapy after 10 sessions on 11/08/2021. Anxiety was said to represent a barrier in ongoing treatment and day-to-day functioning.   Dawn Simmons completed a comprehensive neuropsychological evaluation Kathreen Cornfield(Jenna Renfroe, Ph.D.) on  11/22/2021. Results at that time suggested suggested ongoing impairments surrounding complex attention and all aspects of learning and memory (i.e., encoding, retrieval, recognition). At that time, concerns were expressed surrounding the potential for an underlying neurodegenerative condition, namely Alzheimer's disease. However, Dr. Rueben Bashenfroe theorized that there was likely a strong psychiatric component to Dawn Simmons's current presentation. Repeat testing was recommended. Ultimately, Dawn Simmons and her medical team sought a second opinion and she was scheduled for the current evaluation despite the relatively brief period of time in between evaluations.   She next [facility] up [facility] Ocie Doyne(Jennifer Chima, M.D.) on 01/06/2022 for concerns surrounding memory loss. Outside of what is described above, Dawn Simmons experienced a syncopal episode in 2017 with subsequent am[facility]ia at [employer]. She reportedly brief lost consciousness several times, tried to pick her head up several times but was unable to, and experienced disorientation surrounding who she was, where she was, or where she lived after regaining consciousness. Performance on a brief cognitive screening instrument (MOCA) was 23/30. Ultimately, Dawn Simmons was referred for a comprehensive neuropsychological evaluation to characterize her cognitive abilities and to assist with diagnostic clarity and treatment planning.  EEG on 01/11/2022 was normal. Brain MRI on 01/31/2022 revealed  mild cortical atrophy more pronounced within the medial temporal lobes, said to have notably progressed since her 2017 MRI. It also revealed mild microvascular ischemic changes, typical for her age.   She most recently met [facility] Ihor Austin(Jessica McCue, NP) on 02/09/2022. At that time, Dawn Simmons described her memory as stable if not improved since her previous visit. She was started on Lexapro by her psychiatrist about 1.5 weeks prior and reported notable improvement. Memory difficulties surrounded losing her train of thought and forgetting the names of family members. Performance on the MOCA was 22/30.   Past Medical History:  Diagnosis Date   Allergic rhinitis    Benzodiazepine withdrawal without complication    Cervical radiculopathy at C6    Cheilitis    Chronic fatigue    Chronic obstructive pulmonary disease 09/07/2018   Quit smoking 1974 with copd changes on cxr but no limiting sob as of 09/06/2018  - 09/06/2018   Walked RA  2 laps @  approx 25250ft each @ fast pace  stopped due to  End of study, no sob and sats  100% at end   Fibromyalgia 06/01/2015   History of chicken pox 09/15/2019   History of COVID-19 02/05/2020   History of measles    History of mumps    History of rubella    History of syncope    Insomnia 06/01/2015   Iron deficiency anemia    Left shoulder pain 10/31/2019   Leukopenia 09/15/2019   Low back pain 09/08/2021   Mitral valve prolapse    Mixed hyperlipidemia 11/04/2021   Neuritis    Palpitations    Pharyngitis    Psychogenic nonepileptic seizure    PTSD (post-traumatic stress disorder) 09/15/2019   Restless legs syndrome    Right sided weakness 01/10/2020   Upper airway cough syndrome 09/06/2018   Onset Nov 2019 with uri  - Sinus CT 56/12/2018 neg    Visual disturbance 11/02/2021   Vitamin D deficiency    Von Willebrand disease     Past Surgical History:  Procedure Laterality Date   NO PAST SURGERIES     Current Outpatient  Medications:    Ascorbic Acid (VITAMIN C) 100 MG tablet, Take 100 mg by mouth daily., Disp: ,  Rfl:    Calcium-Magnesium 200-100 MG TABS, Take 4 tablets by mouth daily. 2 tablets in the morning and 2 tablets in the evening, Disp: , Rfl:    diazepam (VALIUM) 5 MG tablet, Take 1 tablet (5 mg total) by mouth at bedtime. Take 1/2-1 tablet po QHS, Disp: 90 tablet, Rfl: 0   ergocalciferol (DRISDOL) 200 MCG/ML drops, , Disp: , Rfl:    escitalopram (LEXAPRO) 10 MG tablet, Take 1 tablet (10 mg total) by mouth daily., Disp: 30 tablet, Rfl: 0   loratadine (CLARITIN) 10 MG tablet, Take 10 mg by mouth daily., Disp: , Rfl:    MAGNESIUM PO, Take 200 mg by mouth at bedtime., Disp: , Rfl:    Multiple Vitamin (MULTIVITAMIN) capsule, Take 1 capsule by mouth daily., Disp: , Rfl:    nystatin (MYCOSTATIN) 100000 UNIT/ML suspension, Apply to corners of mouth three x daily, Disp: 60 mL, Rfl: 1   Omega-3 1000 MG CAPS, Take by mouth., Disp: , Rfl:    triamcinolone cream (KENALOG) 0.1 %, Apply 1 application topically 2 (two) times daily., Disp: 80 g, Rfl: 1   TURMERIC PO, Take by mouth., Disp: , Rfl:   Clinical Interview:   The following information was obtained during a clinical interview with Dawn Simmons and her husband prior to cognitive testing.  Cognitive Symptoms: Decreased short-term memory: Endorsed. Dawn Simmons was quite tangential and vague when describing cognitive concerns. Specific to memory, she repeatedly highlighted her previous history of syncopal episodes. When asked direct questions surrounding memory impairment, she did not provide a direct answer, stating that she felt as though she was "not the same person" from a cognitive perspective. She was repetitive during the interview surrounding her having a prior photogenic memory. Her husband reported memory concerns surrounding Dawn Simmons having trouble recalling names and the details of recent conversations, as well as what was discussed during recent physician  appointments.  Decreased long-term memory: Denied. Decreased attention/concentration: Endorsed. She was very tangential and generally did not provide direct answers surrounding attentional difficulties. Her husband highlighted that she will frequently become distracted and lose her train of thought.  Reduced processing speed: Denied. Difficulties with executive functions: Denied. They also denied trouble with impulsivity or any significant personality changes.  Difficulties with emotion regulation: Denied. Difficulties with receptive language: Denied. Difficulties with word finding: Endorsed. Decreased visuoperceptual ability: Denied.  Trajectory of deficits: Per Ms. Lefferts, difficulties surrounding memory and overall cognition were first noticed after contracting COVID-19 in November 2019 following her attending a wedding with numerous assumed international guests. This timeline is difficult to take at face value as, per data available to the general public, the first COVID-19 case found in the [place] was April 30, 2018 in [place] state. Medical records suggest that treating physicians felt that the likely cause of her illness was pneumonia at that time. Since this November 2019 illness, she described a more episodic, waxing and waning presentation of significant memory dysfunction rather than her perception of continuous, progressive decline. Her husband did report his perception of memory decline being present prior to her November 2019 illness.   Difficulties completing ADLs: Denied. She denied trouble managing medications, finances, or bill paying responsibilities. She does not drive. However, this is due to her history of syncopal episodes (see above) rather than concerns surrounding ongoing cognitive dysfunction.   Additional Medical History: History of traumatic brain injury/concussion: Unclear. She reported a potential head injury during very early childhood. She also attempted to  tell a story  surrounding her having a brain bleed during the second day of her birth and that her father informed medical staff in time to prevent her from developing cerebral palsy. This could not be independently verified. Any more recent head injuries were denied.  History of stroke: Denied. History of seizure activity: Medical records suggest a history of "spells" where Ms. Ramella would suddenly pass out and experience "total amnesia" for a brief time following these events (see above). No recent spells were described and per her husband, there was never an understood neurological cause. Per medical records, the suspicion has surrounded an underlying conversion disorder.  History of known exposure to toxins: Denied. Symptoms of chronic pain: Denied. Experience of frequent headaches/migraines: Denied.  Sensory changes: She wears reading glasses with benefit. She also described persisting diminished senses of taste and smell since her assumed COVID-19 infection. No hearing-related difficulties were reported.  Balance/coordination difficulties: Denied. She also denied any recent falls.  Other motor difficulties: Denied.  Sleep History: Estimated hours obtained each night: 6-8 hours.  Difficulties falling asleep: Denied. She reported taking 0.25mg  of diazepam nightly in order to help her fall asleep.  Difficulties staying asleep: Denied. Feels rested and refreshed upon awakening: Endorsed.  History of snoring: Denied. History of waking up gasping for air: Denied. Witnessed breath cessation while asleep: Denied.  History of vivid dreaming: Endorsed. Excessive movement while asleep: Denied. Instances of acting out her dreams: Denied.  Psychiatric/Behavioral Health History: Depression: While Dawn Simmons did not describe debilitating symptoms of depression, she did acknowledge ongoing mild symptomatology. This was largely said to surround the realization of cognitive decline and ongoing frustration.  She repeatedly stated during the interview that she was "not the same person I was." She has worked with an Haematologist for several years, largely to address symptoms of PTSD (see below) via EMDR treatment. She did report an improvement in overall symptoms since being placed on Lexapro by her psychiatrist. Current or remote suicidal ideation, intent, or plan was denied.  Anxiety: Denied. Mania: Denied. Trauma History: Endorsed. Medical records suggest Ms. Wolfley having symptoms of PTSD from "my whole life." She had previously described a fairly dysfunctional and traumatic childhood. She also described several chronic familial stressors, the most significant surrounding ongoing substance abuse/dependence involving her son. Said difficulties have resulted in her needing to care for her grandson after he was removed from her son's home. These stressors have been ongoing for the better part of the last 10 years. She does meet with a clinician at [facility] for ongoing care and medication management.  Visual/auditory hallucinations: Denied. Delusional thoughts: Denied.  Tobacco: Denied. Alcohol: She reported consuming an occasional small glass of wine with dinner or a Guinness lager with lunch. She denied a history of problematic alcohol abuse or dependence.  Recreational drugs: Denied.  Family History: Problem Relation Age of Onset   Stroke Mother    Dementia Mother        with advanced age   Emphysema Father        smoked   Alcohol abuse Father    Heart failure Maternal Grandmother    Heart attack Maternal Grandfather    Heart failure Paternal Grandmother    Heart attack Paternal Grandfather    OCD Son    Drug abuse Son    Heart attack Maternal Uncle    Seizures Neg Hx    This information was confirmed by Ms. Gan.  Academic/Vocational History: Highest level of educational attainment: 12 years. She graduated from  high school and described herself as a strong (A) student in academic  settings. No relative weaknesses were identified.  History of developmental delay: Denied. History of grade repetition: Denied. Enrollment in special education courses: Denied. History of LD/ADHD: Denied.  Employment: Retired. 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   Evaluation Results:   Behavioral Observations: Dawn Simmons was accompanied by her husband, arrived to her appointment on time, and was appropriately dressed and groomed. She appeared alert. Observed gait and station were within normal limits. Gross motor functioning appeared intact upon informal observation and no abnormal movements (e.g., tremors) were noted. Her affect was generally relaxed and positive, but did range appropriately given the subject being discussed during the clinical interview or the task at hand during testing procedures. Spontaneous speech was fluent and word finding difficulties were not observed during the clinical interview. Thought processes were quite tangential at times. There were several instances where Dawn Simmons's provided response had very little to do with the initial question asked. Thought processes did appear normal in content overall. She was very repetitive during interview. She made comments surrounding her being "not the same person" repeatedly, as well as stories surrounding her syncopal episodes and concerns surrounding COVID-19. During repetition, she appeared unaware that the same information had been shared minutes earlier. Insight into her cognitive difficulties appeared limited and I do not believe she has a full appreciation for the extent of ongoing cognitive dysfunction.   During testing, she was also noted to be very repetitive during conversation and without apparent awareness.  Sustained attention was adequate. However, she was somewhat impulsive and attempted to start tasks prior to the instructions were finished being provided. While initially appropriate, testing tolerance and engagement deteriorated at the evaluation progressed. She reported significant symptoms of anxiety and stress, also commenting that testing was "painful." The current evaluation had to be abbreviated in response. Overall, Ms. Zumbro was cooperative with the clinical interview and generally so with subsequent testing procedures.   Adequacy of Effort: The validity of neuropsychological testing is limited by the extent to which the individual being tested may be assumed to have exerted adequate effort during testing. Ms. Goold expressed her intention to perform to the best of her abilities and exhibited adequate task engagement and persistence. Scores across stand-alone and embedded performance validity measures were within expectation. As such, the results of the current evaluation are believed to be a valid representation of Ms. Modesto's current cognitive functioning.  Test Results: Ms. Console was very mildly disoriented at the time of the current evaluation. She was unable to state the current date and was one day off when stating the current day of the week.   Intellectual abilities based upon educational and vocational attainment were estimated to be in the average range. Premorbid abilities were estimated to be within the above average range based upon a single-word reading test.   Processing speed was average. Basic attention was above average. More complex attention (e.g., working memory) was unable to be assessed due to limited testing tolerance. Cognitive flexibility was variable, ranging from the exceptionally low to average normative ranges. Other aspects of executive functioning were unable to be assessed.  Receptive language abilities were unable to be assessed. Some dysfunction surrounding this  domain is plausible given her tangential responses during interview which were  not always relevant relative to the initial question asked. Assessed expressive language was variable. Phonemic fluency was average, semantic fluency was well below average to average, and confrontation naming was well below average to below average.    Assessed visuospatial/visuoconstructional abilities were variable, ranging from the well below average to average normative ranges.    Learning (i.e., encoding) of novel verbal information was exceptionally low. Spontaneous delayed recall (i.e., retrieval) of previously learned information was also exceptionally low. Retention rates were 0% across a story learning task, 0% across a list learning task, and 0% across a figure drawing task. Performance across recognition tasks was exceptionally low to well below average, suggesting negligible evidence for information consolidation.   Results of emotional screening instruments suggested that recent symptoms of generalized anxiety were in the minimal to mild range, while symptoms of depression were within the mild range. A screening instrument assessing recent sleep quality suggested the presence of minimal sleep dysfunction.  Tables of Scores:   Note: This summary of test scores accompanies the interpretive report and should not be considered in isolation without reference to the appropriate sections in the text. Descriptors are based on appropriate normative data and may be adjusted based on clinical judgment. Terms such as "Within Normal Limits" and "Outside Normal Limits" are used when a more specific description of the test score cannot be determined.       Percentile - Normative Descriptor > 98 - Exceptionally High 91-97 - Well Above Average 75-90 - Above Average 25-74 - Average 9-24 - Below Average 2-8 - Well Below Average < 2 - Exceptionally Low       Validity:   DESCRIPTOR       Dot Counting Test: --- --- Within  Normal Limits  RBANS Effort Index: --- --- Within Normal Limits       Orientation:      Raw Score Percentile   NAB Orientation, Form 1 26/29 --- ---       Cognitive Screening:      Raw Score Percentile   SLUMS: 14/30 --- ---       RBANS, Form A: Standard Score/ Scaled Score Percentile   Total Score 61 <1 Exceptionally Low  Immediate Memory 44 <1 Exceptionally Low    List Learning 2 <1 Exceptionally Low    Story Memory 1 <1 Exceptionally Low  Visuospatial/Constructional 81 10 Below Average    Figure Copy 9 37 Average    Line Orientation 11/20 3-9 Well Below Average  Language 71 3 Well Below Average    Picture Naming 8/10 3-9 Well Below Average    Semantic Fluency 5 5 Well Below Average  Attention 100 50 Average    Digit Span 12 75 Above Average    Coding 8 25 Average  Delayed Memory 48 <1 Exceptionally Low    List Recall 0/10 <2 Exceptionally Low    List Recognition 15/20 <2 Exceptionally Low    Story Recall 1 <1 Exceptionally Low    Story Recognition 4/12 <1 Exceptionally Low    Figure Recall 1 <1 Exceptionally Low    Figure Recognition 3/8 4-8 Well Below Average        Intellectual Functioning:      Standard Score Percentile   Test of Premorbid Functioning: 114 82 Above Average       Attention/Executive Function:     Trail Making Test (TMT): Raw Score (T Score) Percentile     Part A 35 secs.,  0 errors (52) 58 Average  Part B 108 secs.,  1 error (48) 42 Average        D-KEFS Verbal Fluency Test: Raw Score (Scaled Score) Percentile     Letter Total Correct 38 (11) 63 Average    Category Total Correct 27 (8) 25 Average    Category Switching Total Correct 6 (3) 1 Exceptionally Low    Category Switching Accuracy 5 (4) 2 Well Below Average      Total Set Loss Errors 3 (9) 37 Average      Total Repetition Errors 2 (11) 63 Average       Language:     Verbal Fluency Test: Raw Score (T Score) Percentile     Phonemic Fluency (FAS) 38 (51) 54 Average    Animal  Fluency 12 (38) 12 Below Average        NAB Language Module, Form 1: T Score Percentile     Naming 27/31 (40) 16 Below Average       Visuospatial/Visuoconstruction:      Raw Score Percentile   Clock Drawing: 8/10 --- Within Normal Limits       Mood and Personality:      Raw Score Percentile   Geriatric Depression Scale: 14 --- Mild  Geriatric Anxiety Scale: 9 --- Minimal    Somatic 3 --- Minimal    Cognitive 5 --- Mild    Affective 1 --- Minimal       Additional Questionnaires:      Raw Score Percentile   PROMIS Sleep Disturbance Questionnaire: 8 --- None to Slight   Informed Consent and Coding/Compliance:   The current evaluation represents a clinical evaluation for the purposes previously outlined by the referral source and is in no way reflective of a forensic evaluation.   Dawn Simmons was provided with a verbal description of the nature and purpose of the present neuropsychological evaluation. Also reviewed were the foreseeable risks and/or discomforts and benefits of the procedure, limits of confidentiality, and mandatory reporting requirements of this provider. The patient was given the opportunity to ask questions and receive answers about the evaluation. Oral consent to participate was provided by the patient.   This evaluation was conducted by Newman NickelsZachary C. Rocsi Hazelbaker, Ph.D., ABPP-CN, board certified clinical neuropsychologist. Dawn Simmons completed a clinical interview with Dr. Milbert CoulterMerz, billed as one unit [phone], and 100 minutes of cognitive testing and scoring, billed as one unit [phone] and two additional units [id number]. Psychometrist Wallace Kellerana Chamberlain, B.S., assisted Dr. Milbert CoulterMerz with test administration and scoring procedures. As a separate and discrete service, Dr. Milbert CoulterMerz spent a total of 160 minutes in interpretation and report writing billed as one unit [phone] and two units [id number].

## 2022-05-03 NOTE — Progress Notes (Unsigned)
   Psychometrician Note   Cognitive testing was administered to Marlou Sa by Milana Kidney, B.S. (psychometrist) under the supervision of Dr. Christia Reading, Ph.D., licensed psychologist on 05/03/2022. Ms. Truby did not appear overtly distressed by the testing session per behavioral observation or responses across self-report questionnaires. Rest breaks were offered.    The battery of tests administered was selected by Dr. Christia Reading, Ph.D. with consideration to Ms. Pinheiro's current level of functioning, the nature of her symptoms, emotional and behavioral responses during interview, level of literacy, observed level of motivation/effort, and the nature of the referral question. This battery was communicated to the psychometrist. Communication between Dr. Christia Reading, Ph.D. and the psychometrist was ongoing throughout the evaluation and Dr. Christia Reading, Ph.D. was immediately accessible at all times. Dr. Christia Reading, Ph.D. provided supervision to the psychometrist on the date of this service to the extent necessary to assure the quality of all services provided.    Jerolene Kupfer will return within approximately 1-2 weeks for an interactive feedback session with [name] at which time her test performances, clinical impressions, and treatment recommendations will be reviewed in detail. Ms. Coggin understands she can contact our office should she require our assistance before this time.  A total of 100 minutes of billable time were spent face-to-face with Ms. Bodnar by the psychometrist. This includes both test administration and scoring time. Billing for these services is reflected in the clinical report generated by Dr. Christia Reading, Ph.D.  This note reflects time spent with the psychometrician and does not include test scores or any clinical interpretations made by [name]. The full report will follow in a separate note.

## 2022-05-04 ENCOUNTER — Encounter: Payer: Self-pay | Admitting: Psychology

## 2022-05-04 NOTE — Progress Notes (Signed)
Thank you for your input and update! Greatly appreciated!

## 2022-05-05 ENCOUNTER — Ambulatory Visit (INDEPENDENT_AMBULATORY_CARE_PROVIDER_SITE_OTHER): Payer: Medicare Other | Admitting: Psychology

## 2022-05-05 DIAGNOSIS — F4310 Post-traumatic stress disorder, unspecified: Secondary | ICD-10-CM | POA: Diagnosis not present

## 2022-05-05 DIAGNOSIS — F32A Depression, unspecified: Secondary | ICD-10-CM

## 2022-05-05 NOTE — Progress Notes (Signed)
[facility] Counselor/Therapist Progress Note  Patient ID: Lin Hackmann, MRN: [id number],    Date: 05/05/2022  Time Spent: 60 minutes  Treatment Type: Individual Therapy  Reported Symptoms: anxiety and depression and memory loss  Mental Status Exam: Appearance:  Casual     Behavior: Appropriate  Motor: Normal  Speech/Language:  Normal Rate  Affect: Blunt  Mood: pleasant  Thought process: normal  Thought content:   WNL  Sensory/Perceptual disturbances:   WNL  Orientation: oriented to person, place, time/date, and situation  Attention: Good  Concentration: Good  Memory: WNL  Fund of knowledge:  Good  Insight:   Good  Judgment:  Good  Impulse Control: Good   Risk Assessment: Danger to Self:  No Self-injurious Behavior: No Danger to Others: No Duty to Warn:no Physical Aggression / Violence:No  Access to Firearms a concern: No  Gang Involvement:No   Subjective: The patient attended a face-to-face individual therapy session in the office today.  The patient presents with a blunted affect and mood is pleasant.  The patient reports that she does not quite feel as good as she did on the Lexapro when she first started taking it.  She says she is only on 10 mg a day.  She does have an appointment with her medication provider in the coming weeks and I recommended that she have a conversation with her about this.  In addition the patient saw [name] and had some more neuropsych testing.  I have some confusion about the purpose of her going for some more testing however we will see what the results of these tests are.  She has had neuropsych testing in October of last year by Dr. Lorelle Gibbs and she also went to a neurologist recently.  The patient continues to say that she would like to be better.  I asked her what better meant to her and she states that it means that she is the way she used to be.  I tried to explain to the patient that as our brain ages we cannot be exactly  like we used to be and that there may be some issues with that expectation.  We will continue to explore this and we will talk about the results of the testing when they receive them.   Interventions: Cognitive Behavioral Therapy and Eye Movement Desensitization and Reprocessing (EMDR)  Diagnosis:PTSD (post-traumatic stress disorder)  Depression, unspecified depression type  Plan: Treatment Plan  Strengths/Abilities:Insightful, motivated, supportive husband  Treatment Preferences: Outpatient Individual therapy  Statement of Needs: "I have a problem passing out and I was told I have PTSD"    Symptoms:  Demonstrates an exaggerated startle response.:(Status: improved). Depressed  or irritable mood.: (Status: improved). Describes a reliving of the event,  particularly through dissociative flashbacks.:  (Status: improved). Displays a  significant decline in interest and engagement in activities.: (Status: improved). Displays significant psychological and/or physiological distress resulting from internal and external  clues that are reminiscent of the traumatic event.: (Status: improved).  Experiences disturbances in sleep.: (Status: improved). Experiences disturbing  and persistent thoughts, images, and/or perceptions of the traumatic event.: (Status: improved). Experiences frequent nightmares.: (Status: improved).  Feelings of hopelessness, worthlessness, or inappropriate guilt.: (Status:  improved). Has been exposed to a traumatic event involving actual or perceived threat of death or  serious injury.: (Status: maintained). Impairment in social, occupational, or  other areas of functioning.: (Status: improved). Intentionally avoids activities,  places, people, or objects (e.g., up-armored vehicles) that evoke memories of the event.:(Status:  maintained). Intentionally avoids thoughts, feelings, or discussions related  to the traumatic event.: (Status: improved). Reports difficulty  concentrating as  well as feelings of guilt.:  (Status: improved). Reports response of intense fear,  helplessness, or horror to the traumatic event.:  (Status: improved).  Problems Addressed: PTSD   Goals:  LTG:  1. Develop healthy thinking patterns and beliefs about self, others, and the world that lead to the alleviation and help prevent the relapse of  depression.  70% Objective Identify and replace thoughts and beliefs that support depression.  70 % Target Date: 2023-05-02 Frequency: biWeekly Progress: 60 Modality: individual 2. Eliminate or reduce the negative impact trauma related symptoms have  on social, occupational, and family functioning.  60% Objective Learn and implement personal skills to manage challenging situations related to trauma. Target Date: 2023-05-02 Frequency: biWeekly Progress: 40 Modality: individual3. No longer avoids persons, places, activities, and objects that are  reminiscent of the traumatic event. Objective 3.  Participate in Eye Movement Desensitization and Reprocessing (EMDR) to reduce emotional distress  related to traumatic thoughts, feelings, and images. Target Date: 2023-05-02 Frequency: biWeekly Progress: 60 Modality: individual 4.  Learn and implement guided self-dialogue to manage thoughts, feelings, and urges brought on by  encounters with trauma-related situations. Target Date: 2023-05-02 Frequency: biWeekly Progress: 50 Modality: individual 5.  No longer experiences intrusive event recollections, avoidance of event  reminders, intense arousal, or disinterest in activities or  relationships. Target Date:05/02/2023  Frequency : biWeekly Progress 60    Modality: individual 6.Thinks about or openly discusses the traumatic event with others  without experiencing psychological or physiological distress. Target Date : 05/02/2023 Frequency: biWeekly Progress:30   Modality: Individual Interventions by Therapist:  CBT, problem solving therapy,  EMDR, insight oriented.  Patient approved Treatment plan.  Jacoby Zanni G Kenisha Lynds, LCSW

## 2022-05-10 ENCOUNTER — Ambulatory Visit (INDEPENDENT_AMBULATORY_CARE_PROVIDER_SITE_OTHER): Payer: Medicare Other | Admitting: Psychiatry

## 2022-05-10 ENCOUNTER — Ambulatory Visit (INDEPENDENT_AMBULATORY_CARE_PROVIDER_SITE_OTHER): Payer: Medicare Other | Admitting: Psychology

## 2022-05-10 ENCOUNTER — Encounter: Payer: Self-pay | Admitting: Psychiatry

## 2022-05-10 DIAGNOSIS — G3184 Mild cognitive impairment, so stated: Secondary | ICD-10-CM | POA: Diagnosis not present

## 2022-05-10 DIAGNOSIS — F4310 Post-traumatic stress disorder, unspecified: Secondary | ICD-10-CM | POA: Diagnosis not present

## 2022-05-10 DIAGNOSIS — F32A Depression, unspecified: Secondary | ICD-10-CM

## 2022-05-10 DIAGNOSIS — G4700 Insomnia, unspecified: Secondary | ICD-10-CM

## 2022-05-10 MED ORDER — DIAZEPAM 5 MG PO TABS: 5.0000 mg | ORAL_TABLET | Freq: Every day | ORAL | 0 refills | Status: DC

## 2022-05-10 MED ORDER — ESCITALOPRAM OXALATE 10 MG PO TABS: 10.0000 mg | ORAL_TABLET | Freq: Every day | ORAL | 0 refills | Status: DC

## 2022-05-10 NOTE — Progress Notes (Signed)
   Neuropsychology Feedback Session [facility]  Reason for Referral:   Dawn Simmons is a 77 y.o. right-handed Caucasian female referred by  Lynne Leader, M.D. 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   Feedback:   Dawn Simmons completed a comprehensive neuropsychological evaluation on 05/03/2022. Please refer to that encounter for the full report and recommendations. Briefly, results suggested profound impairment surrounding all aspects of learning and memory. Further variability and/or weakness was exhibited across cognitive flexibility, semantic fluency, confrontation naming, and visuospatial abilities. Regarding etiology, I unfortunately have concerns surrounding the presence of a neurodegenerative illness, namely Alzheimer's disease. Across memory testing, Dawn Simmons did not benefit from repeated exposure to new information, was fully amnestic (i.e., 0% retention) after a brief delay across all memory tasks, and performed very poorly across yes/no recognition trials. 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 Further variability and weakness in semantic fluency, confrontation naming, and visuospatial abilities would follow typical disease progression, also elevating concerns. Recent neuroimaging (which was not available at the time of her previous neuropsychological evaluation) revealed more pronounced atrophy in the medial temporal lobes, which is a well established risk factor for the presence of this disease process. There is undoubtedly a psychiatric component to her current presentation. However, these experiences (as well as her COVID-19 vs pneumonia  experience in in November 2019) would also not cause medial temporal lobe brain atrophy in a typical Alzheimer's disease pattern. Overall, while there is very likely a psychiatric contribution, I do not believe that this fully accounts for ongoing cognitive decline on its own.  Dawn Simmons was accompanied by her husband during the current feedback session. Content of the current session focused on the results of her neuropsychological evaluation. Dawn Simmons was given the opportunity to ask questions and her questions were answered. She was encouraged to reach out should additional questions arise. A copy of her report was provided at the conclusion of the visit.      45 minutes were spent conducting the current feedback session with Dawn Simmons, billed as one unit [phone].

## 2022-05-10 NOTE — Progress Notes (Signed)
Dawn Simmons [id number] [birth year] 77 y.o.  Subjective:   Patient ID:  Dawn Simmons is a 77 y.o. (DOB Dec 03, 1945) female.  Chief Complaint:  Chief Complaint  Patient presents with   Follow-up    Anxiety    HPI Dawn Simmons presents to the office today for follow-up of anxiety, depression, and cognitive changes. She reports, "I feel like I am making some progress." She reports that she is "a little more relaxed, unafraid." She reports that she saw Hazle Coca, PhD for neuropsych testing and has an apt this afternoon to discuss findings. She reports that she has some anxiety around this visit. She reports an overall improvement in anxiety. She reports occasional moments where things do not feel "quite right...like I float away... not connected." Had a stressful experience at the dentist that was similar to past traumatic experiences. She describes sadness in response to cognitive changes. Denies any conflict with family members. She reports that she spends time alone since she is unable to drive. She denies persistent depressed mood. She reports that she usually does not do well with cold weather. 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 "I couldn't do it before. I would say I was going to do it, but I wouldn't. I didn't have any heart to it." She reports difficulty with concentration. She had some difficulty falling asleep last night in anticipation of apt. She reports that she has been sleeping well overall. No change in appetite. Denies SI.   She has been trying to increase her physical activity. Took stairs to apt on 4th floor prior to apt.   She reports that her children are doing, "better, a lot better." Has been able to spend time with grandson that they were unable to see him for years. "Everyone is doing so well now."   She  reports that she forgot to take some of her vitamins and is working on re-introducing these.   Diazepam last filled 04/10/22.  Past Psychiatric Medication Trials: (She reports that she is very sensitive to medication) Diazepam- Prescribed as need with limited improvement.  Prozac- reports that she has to take Prozac 20 mg in divided doses.  [place] [facility] Visit from 01/27/2022 in [facility]  Total GAD-7 Score 0      PHQ2-9    [place] [facility] Visit from 03/31/2022 in [facility] at [place] Visit from 02/14/2022 in [facility] at [place] Visit from 01/27/2022 in [facility] at [place] Visit from 12/06/2021 in [facility] at [place] Visit from 10/19/2021 in [facility] Care at [facility] Total Score 0 0 2 0 2  PHQ-9 Total Score -- -- 4 0 13        Review of Systems:  Review of Systems  Medications: I have reviewed the patient's current medications.  Current Outpatient Medications  Medication Sig Dispense Refill   Ascorbic Acid (VITAMIN C) 100 MG tablet Take 100 mg by mouth daily.     Calcium-Magnesium 200-100 MG TABS Take 4 tablets by mouth daily. 2 tablets in the morning and 2 tablets in the evening  diazepam (VALIUM) 5 MG tablet Take 1 tablet (5 mg total) by mouth at bedtime. Take 1/2-1 tablet po QHS 90 tablet 0   ergocalciferol (DRISDOL) 200 MCG/ML drops      escitalopram (LEXAPRO) 10 MG tablet Take 1 tablet (10 mg total) by mouth daily. 90 tablet 0   loratadine (CLARITIN) 10 MG tablet Take 10 mg by mouth daily.     MAGNESIUM PO Take 200 mg by mouth at bedtime.     Multiple Vitamin (MULTIVITAMIN) capsule Take 1 capsule by mouth daily.     nystatin (MYCOSTATIN) 100000 UNIT/ML suspension Apply to corners of  mouth three x daily 60 mL 1   Omega-3 1000 MG CAPS Take by mouth.     triamcinolone cream (KENALOG) 0.1 % Apply 1 application topically 2 (two) times daily. 80 g 1   TURMERIC PO Take by mouth.     No current facility-administered medications for this visit.    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   Benzodiazepine withdrawal without complication    Cervical radiculopathy at C6    Cheilitis    Chronic fatigue    Chronic obstructive pulmonary disease 09/07/2018   Quit smoking 1974 with copd changes on cxr but no limiting sob as of 09/06/2018  - 09/06/2018   Walked RA  2 laps @  approx 259ft each @ fast pace  stopped due to  End of study, no sob and sats  100% at end   Fibromyalgia 06/01/2015   History of chicken pox 09/15/2019   History of COVID-19 02/05/2020   History of measles    History of mumps    History of rubella    History of syncope    Insomnia 06/01/2015   Iron deficiency anemia    Left shoulder pain 10/31/2019   Leukopenia 09/15/2019   Low back pain 09/08/2021   Mitral valve prolapse    Mixed hyperlipidemia 11/04/2021   Neuritis    Palpitations    Pharyngitis    Psychogenic nonepileptic seizure    PTSD (post-traumatic stress disorder) 09/15/2019   Restless legs syndrome    Right sided weakness 01/10/2020   Upper airway cough syndrome 09/06/2018   Onset Nov 2019 with uri  - Sinus CT 56/12/2018 neg    Visual disturbance 11/02/2021   Vitamin D deficiency    Von Willebrand disease     Past Medical History, Surgical history, Social history, and Family history were reviewed and updated as appropriate.   Please see review of systems for further details on  the patient's review from today.   Objective:   Physical Exam:  There were no vitals taken for this visit.  Physical Exam Constitutional:      General: She is not in acute distress. Musculoskeletal:        General: No deformity.  Neurological:     Mental Status: She is alert and oriented to person, place, and time.     Coordination: Coordination normal.  Psychiatric:        Attention and Perception: Attention and perception normal. She does not perceive auditory or visual hallucinations.        Mood and Affect: Mood is not  depressed. Affect is not labile, blunt, angry or inappropriate.        Speech: Speech normal.        Behavior: Behavior normal.        Thought Content: Thought content normal. Thought content is not paranoid or delusional. Thought content does not include homicidal or suicidal ideation. Thought content does not include homicidal or suicidal plan.        Cognition and Memory: Memory is impaired.        Judgment: Judgment normal.     Comments: Insight intact Mood is anxious in response to apt today     Lab Review:     Component Value Date/Time   NA 140 01/25/2021 1504   K 4.0 01/25/2021 1504   CL 106 01/25/2021 1504   CO2 25 01/25/2021 1504   GLUCOSE 88 01/25/2021 1504   BUN 17 01/25/2021 1504   CREATININE 0.88 01/25/2021 1504   CREATININE 0.97 (H) 01/10/2020 1358   CALCIUM 10.0 01/25/2021 1504   PROT 6.9 01/25/2021 1504   ALBUMIN 4.6 01/25/2021 1504   AST 23 01/25/2021 1504   ALT 15 01/25/2021 1504   ALKPHOS 67 01/25/2021 1504   BILITOT 0.6 01/25/2021 1504       Component Value Date/Time   WBC 4.0 01/25/2021 1504   RBC 4.08 01/25/2021 1504   HGB 12.9 01/25/2021 1504   HCT 38.1 01/25/2021 1504   PLT 272.0 01/25/2021 1504   MCV 93.4 01/25/2021 1504   MCH 31.3 01/10/2020 1358   MCHC 33.9 01/25/2021 1504   RDW 13.1 01/25/2021 1504   LYMPHSABS 1,197 01/10/2020 1358   MONOABS 0.5 10/31/2019 1509   EOSABS 198 01/10/2020 1358   BASOSABS 32  01/10/2020 1358    No results found for: "POCLITH", "LITHIUM"   No results found for: "PHENYTOIN", "PHENOBARB", "VALPROATE", "CBMZ"   .res Assessment: Plan:    Pt seen for 30 minutes and time spent discussing treatment plan. She reports that she continues to experience some gradual improvement in her mood and anxiety, although she reports a more "dramatic" improvement after first starting Lexapro. Discussed option of increasing Lexapro to 15 mg po qd to target residual anxiety and depressive s/s, however she reports that she prefers to take the least amount of medication possible and would like to try increasing her physical activity and dancing before increasing medication. She reports that she also tends to feel better in general once winter is over. Discussed re-evaluating increase in the future if she continues to have residual mood and anxiety symptoms.  Will continue Lexapro 10 mg daily.  Will continue Diazepam 5 mg po QHS for insomnia and anxiety.  Pt to follow-up in 2 months or sooner if clinically indicated.  Patient advised to contact office with any questions, adverse effects, or acute worsening in signs and symptoms.    Dawn Simmons was seen today for follow-up.  Diagnoses and all orders for this visit:  PTSD (post-traumatic stress disorder) -     escitalopram (LEXAPRO) 10 MG tablet; Take 1 tablet (10 mg total) by mouth daily. -     diazepam (VALIUM) 5 MG tablet; Take 1 tablet (5 mg total) by mouth at bedtime. Take 1/2-1 tablet po QHS  Depression, unspecified depression type -     escitalopram (LEXAPRO) 10 MG tablet; Take 1 tablet (10 mg total) by mouth daily.  Insomnia, unspecified type -     diazepam (VALIUM) 5 MG tablet; Take 1 tablet (5 mg total) by mouth at bedtime. Take  1/2-1 tablet po QHS     Please see After Visit Summary for patient specific instructions.  Future Appointments  Date Time Provider Department Center  05/19/2022  1:00 PM Cottle, Lynnell Dike, LCSW [facility]  None  06/02/2022  1:00 PM Cottle, Bambi G, LCSW [facility] None  06/16/2022  1:00 PM Cottle, Bambi G, LCSW [facility] None  06/30/2022  1:00 PM Cottle, Bambi G, LCSW [facility] None  07/06/2022  1:30 PM Corie Chiquito, PMHNP [facility] None  07/12/2022  1:15 PM Glean Salvo, NP [facility] None  07/14/2022  1:00 PM Cottle, Bambi G, LCSW [facility] None  07/21/2022  3:00 PM Bradd Canary, MD [facility]  07/28/2022  1:00 PM Cottle, Bambi G, LCSW [facility] None  08/11/2022  1:00 PM Cottle, Bambi G, LCSW [facility] None    No orders of the defined types were placed in this encounter.   -------------------------------

## 2022-05-12 ENCOUNTER — Encounter: Payer: Self-pay | Admitting: Family Medicine

## 2022-05-16 ENCOUNTER — Ambulatory Visit: Payer: Medicare Other | Admitting: Family Medicine

## 2022-05-17 ENCOUNTER — Telehealth: Payer: Self-pay | Admitting: Adult Health

## 2022-05-17 NOTE — Telephone Encounter (Signed)
Phone rep called pt and relayed message from [place], [place] stating that she needs to contact Dr Melvyn Novas or her therapist, pt stated ok thank you and the call ended, this is FYI for POD 3 no call back requested

## 2022-05-17 NOTE — Telephone Encounter (Signed)
Pt is calling. Stated she need to make an appointment with Janett Billow. Pt would repeat what I'm saying to her and  then would say I need to see Janett Billow for PTSD and then she would say something I couldn't hear. Pt wouldn't tell me why she need an appointment.

## 2022-05-19 ENCOUNTER — Ambulatory Visit (INDEPENDENT_AMBULATORY_CARE_PROVIDER_SITE_OTHER): Payer: Medicare Other | Admitting: Psychology

## 2022-05-19 DIAGNOSIS — F4310 Post-traumatic stress disorder, unspecified: Secondary | ICD-10-CM | POA: Diagnosis not present

## 2022-05-20 NOTE — Progress Notes (Signed)
[facility] Counselor/Therapist Progress Note  Patient ID: Dawn Simmons, MRN: [id number],    Date: 05/19/2022  Time Spent: 60 minutes  Treatment Type: Individual Therapy  Reported Symptoms: anxiety and depression and memory loss  Mental Status Exam: Appearance:  Casual     Behavior: Appropriate  Motor: Normal  Speech/Language:  Normal Rate  Affect: Blunt  Mood: pleasant  Thought process: normal  Thought content:   WNL  Sensory/Perceptual disturbances:   WNL  Orientation: oriented to person, place, time/date, and situation  Attention: Good  Concentration: Good  Memory: WNL  Fund of knowledge:  Good  Insight:   Good  Judgment:  Good  Impulse Control: Good   Risk Assessment: Danger to Self:  No Self-injurious Behavior: No Danger to Others: No Duty to Warn:no Physical Aggression / Violence:No  Access to Firearms a concern: No  Gang Involvement:No   Subjective: The patient attended a face-to-face individual therapy session in the office today.  The patient presents with a blunted affect and mood is pleasant.   The patient reports that she feels like she is ready to go out on her own and stop therapy.  We talked about how she came to making this decision and apparently she went to see the neuropsychologist and get the results of her testing and he basically said that she had some dementia.  The patient states that she had a difficult time adapting to this but she was able to get to some level of acceptance about it.  This was one of the things I was concerned about because she seemed to have an unrealistic expectation that she would return back to her former self.  The patient states that she feels like therapy has been very good for her and we reviewed all of the things that we did EMDR on and she reports that she still feels better will all of those circumstances and I gave her a list of coping strategies so that she could remember to use them.  The patient is  aware that she can come back to therapy if she needs to moving forward and at this point today will be our last visit.  Interventions: Cognitive Behavioral Therapy and Eye Movement Desensitization and Reprocessing (EMDR)  Diagnosis:PTSD (post-traumatic stress disorder)  Plan: Treatment Plan  Strengths/Abilities:Insightful, motivated, supportive husband  Treatment Preferences: Outpatient Individual therapy  Statement of Needs: "I have a problem passing out and I was told I have PTSD"    Symptoms:  Demonstrates an exaggerated startle response.:(Status: improved). Depressed  or irritable mood.: (Status: improved). Describes a reliving of the event,  particularly through dissociative flashbacks.:  (Status: improved). Displays a  significant decline in interest and engagement in activities.: (Status: improved). Displays significant psychological and/or physiological distress resulting from internal and external  clues that are reminiscent of the traumatic event.: (Status: improved).  Experiences disturbances in sleep.: (Status: improved). Experiences disturbing  and persistent thoughts, images, and/or perceptions of the traumatic event.: (Status: improved). Experiences frequent nightmares.: (Status: improved).  Feelings of hopelessness, worthlessness, or inappropriate guilt.: (Status:  improved). Has been exposed to a traumatic event involving actual or perceived threat of death or  serious injury.: (Status: maintained). Impairment in social, occupational, or  other areas of functioning.: (Status: improved). Intentionally avoids activities,  places, people, or objects (e.g., up-armored vehicles) that evoke memories of the event.:(Status: maintained). Intentionally avoids thoughts, feelings, or discussions related  to the traumatic event.: (Status: improved). Reports difficulty concentrating as  well as feelings of  guilt.:  (Status: improved). Reports response of intense fear,  helplessness,  or horror to the traumatic event.:  (Status: improved).  Problems Addressed: PTSD   Goals:  LTG:  1. Develop healthy thinking patterns and beliefs about self, others, and the world that lead to the alleviation and help prevent the relapse of  depression.  80% Objective Identify and replace thoughts and beliefs that support depression.  80 % Target Date: 2023-05-02 Frequency: biWeekly Progress: 70 Modality: individual 2. Eliminate or reduce the negative impact trauma related symptoms have  on social, occupational, and family functioning.  80% Objective Learn and implement personal skills to manage challenging situations related to trauma. Target Date: 2023-05-02 Frequency: biWeekly Progress: 70 Modality: individual3. No longer avoids persons, places, activities, and objects that are  reminiscent of the traumatic event. Objective 3.  Participate in Eye Movement Desensitization and Reprocessing (EMDR) to reduce emotional distress  related to traumatic thoughts, feelings, and images. Target Date: 2023-05-02 Frequency: biWeekly Progress: 80 Modality: individual 4.  Learn and implement guided self-dialogue to manage thoughts, feelings, and urges brought on by  encounters with trauma-related situations. Target Date: 2023-05-02 Frequency: biWeekly Progress: 80 Modality: individual 5.  No longer experiences intrusive event recollections, avoidance of event  reminders, intense arousal, or disinterest in activities or  relationships. Target Date:05/02/2023  Frequency : biWeekly Progress 90   Modality: individual 6.Thinks about or openly discusses the traumatic event with others  without experiencing psychological or physiological distress. Target Date : 05/02/2023 Frequency: biWeekly Progress:90   Modality: Individual Interventions by Therapist:  CBT, problem solving therapy, EMDR, insight oriented.  Patient approved Treatment plan.  Iman Reinertsen G Crystalmarie Yasin,  LCSW

## 2022-05-25 ENCOUNTER — Telehealth: Payer: Self-pay | Admitting: Family Medicine

## 2022-05-25 ENCOUNTER — Telehealth: Payer: Self-pay | Admitting: Adult Health

## 2022-05-25 NOTE — Telephone Encounter (Signed)
Pt called and refused to discuss with me what is going on and just stated that she would like to speak to provider for a couple of min. Please advise.

## 2022-05-25 NOTE — Telephone Encounter (Signed)
Pt is calling. Stated she needs to speak with Dawn Simmons. Pt wouldn't tell me why she needed to talk to [name]. Pt is requesting Dawn Simmons call her back.

## 2022-05-25 NOTE — Telephone Encounter (Signed)
I see that you had a neuropsychological evaluation. You are scheduled to see neurology but not until April.  Would you like to return to clinic to talk about the results and what to do about it?

## 2022-05-25 NOTE — Telephone Encounter (Addendum)
Contacted pt back, she stated she just wanted to Dawn Simmons for her suggestion on changing her mediation to Lexapro. I informed her that was Thayer Headings with behavior health, not our office. I spoke to the patient for 10 mins explanting to her that [name] did not change or prescribe her any medications during the last visit. She was very confused and doesn't recall ever seeing anyone in behavior health/psych. I informed her I will pass the message along.

## 2022-05-25 NOTE — Telephone Encounter (Signed)
Called pt, husband answered the phone and advised that pt is on the phone with another doctors office.   I told her husband that I would try her again tomorrow, he notes that around 11:00 AM would be ideal.

## 2022-05-26 NOTE — Telephone Encounter (Signed)
Called pt asked her what kind of appointment she needed with Janett Billow. Pt said I really don't need an appointment with her I just wanted to tell her thank you. Pt said again tell her thank you and she hung up the phone.

## 2022-05-26 NOTE — Telephone Encounter (Signed)
Called and left VM requesting return call.

## 2022-05-27 NOTE — Telephone Encounter (Signed)
Pt let vmail returning your call.

## 2022-05-27 NOTE — Telephone Encounter (Signed)
Spoke with patient and relayed info from Dr. Georgina Snell.   Appointment scheduled for 05/30/22 at 11:15 Am to discuss results.

## 2022-05-30 ENCOUNTER — Ambulatory Visit (INDEPENDENT_AMBULATORY_CARE_PROVIDER_SITE_OTHER): Payer: Medicare Other | Admitting: Family Medicine

## 2022-05-30 VITALS — BP 120/68 | HR 78 | Ht 63.0 in | Wt 130.0 lb

## 2022-05-30 DIAGNOSIS — G3184 Mild cognitive impairment, so stated: Secondary | ICD-10-CM

## 2022-05-30 DIAGNOSIS — F0280 Dementia in other diseases classified elsewhere without behavioral disturbance: Secondary | ICD-10-CM

## 2022-05-30 DIAGNOSIS — R413 Other amnesia: Secondary | ICD-10-CM | POA: Insufficient documentation

## 2022-05-30 DIAGNOSIS — G309 Alzheimer's disease, unspecified: Secondary | ICD-10-CM | POA: Diagnosis not present

## 2022-05-30 MED ORDER — DONEPEZIL HCL 5 MG PO TABS: 5.0000 mg | ORAL_TABLET | Freq: Every day | ORAL | 3 refills | Status: DC

## 2022-05-30 NOTE — Patient Instructions (Addendum)
Thank you for coming in today.   Take Aracept for memory  Keep your appointment with Neurology in April .   [facility] is a good resource for finding a Engineer, water or therapist.   Keep me updated.   I am here for you if you need me.

## 2022-05-30 NOTE — Progress Notes (Signed)
I, Dawn Simmons, LAT, ATC acting as a scribe for Dawn Leader, MD.  Dawn Simmons is a 77 y.o. female who presents to Dawn Simmons at [facility] today to review her test results w/ cognitive impairment following COVID. Pt was last seen by Dr. Georgina Simmons on 12/20/21, advised to follow up with neuro. Continues seeing [place] for treatment of PTSD. Would like to review results from MRI and EEG from Neuro. Today, pt reports she is feeling a bit better. Pt reports that she is no longer seeing counseling and they stopped the [facility] therapy, but that it had been very helpful. She notes that she switched from prozac to lexapro, which she also feels has been very beneficial.   The goal of the visit today is to review the results of the neuropsychological evaluation performed in January 2024 concerning for Alzheimer's disease potentially progressing to dementia.  Current diagnosis of mild cognitive impairment. She is no longer driving.  Her driver's license is expired and expired for 2 years.  She is no longer cooking at home. Her husband attends medical visits with her.  Dx imaging: 11/05/21 Head CT             01/10/20 Head CT  Pertinent review of systems: No fevers or chills  Relevant historical information: Cognitive impairment   Exam:  BP 120/68   Pulse 78   Ht 5' 3"$  (1.6 m)   Wt 130 lb (59 kg)   SpO2 99%   BMI 23.03 kg/m  General: Well Developed, well nourished, and in no acute distress.   Neuropsych: Alert and oriented.  Patient is tangential talking during the discussion today about her PTSD and prior medications.  She was able to be reoriented back to the main goal of the conversation.    Lab and Radiology Results   NEUROIMAGING REPORT     STUDY DATE: 01/31/2022 PATIENT NAME: Dawn Simmons DOB: Jan 03, 1946 MRN: [id number]   EXAM: MRI Brain with and without contrast   ORDERING CLINICIAN: Genia Harold, MD CLINICAL HISTORY: 77 year old woman with  memory loss COMPARISON FILMS: CT 11/05/2021 and MRI 06/14/2015   TECHNIQUE:MRI of the brain with and without contrast was obtained utilizing 5 mm axial slices with T1, T2, T2 flair, SWI and diffusion weighted views.  T1 sagittal, T2 coronal and postcontrast views in the axial and coronal plane were obtained.   CONTRAST: 5 mL Vueway   IMAGING SITE: [facility], [place], [place], [place]    FINDINGS: On sagittal images, the spinal cord is imaged caudally to C4 and is normal in caliber.   The contents of the posterior fossa are of normal size and position.   The pituitary gland and optic chiasm appear normal.    There is mild generalized cortical atrophy with more pronounced medial temporal lobe atrophy.  This atrophy has markedly progressed compared to the 2017 MRI there is unchanged compared to the 11/05/2021 CT scan.  There are no abnormal extra-axial collections of fluid.     The cerebellum and brainstem appears normal.   The deep gray matter appears normal.   There are a few scattered T2/FLAIR hyperintense foci in the subcortical or deep white matter and mild hyperintensity in the periventricular white matter..  This is mildly progressed compared to the 2017 MRI.  Diffusion weighted images are normal.  Susceptibility weighted images are normal.      The orbits appear normal.   The VIIth/VIIIth nerve complex appears normal.  The mastoid air  cells appear normal.  The paranasal sinuses appear normal.  Flow voids are identified within the major intracerebral arteries.     After the infusion of contrast material, a normal enhancement pattern is noted.     IMPRESSION:   This MRI of the brain with and without contrast shows the following: Mild generalized cortical atrophy that is more advanced in the medial temporal lobes, unchanged compared to the 11/05/2021 CT scan but significantly progressed compared to the 06/14/2015 MRI when no significant atrophy was noted.  Although not entirely  specific, this pattern is consistent with Alzheimer's disease Some T2/FLAIR hyperintense foci in the cerebral white matter consistent with mild chronic microvascular ischemic changes, mildly progressed compared to the 2017 MRI.  The extent is typical for age. No acute findings.  Normal enhancement pattern.     INTERPRETING PHYSICIAN:  Dawn A. Felecia Shelling, MD, PhD, FAAN Certified in  Neuroimaging by [employer] I, Dawn Simmons, personally (independently) visualized and performed the interpretation of the images attached in this note.   Note from neuropsychological evaluation dated May 03, 2022 Clinical Impression(s): [name] pattern of performance is suggestive of profound impairment surrounding all aspects of learning and memory. Further variability and/or weakness was exhibited across cognitive flexibility, semantic fluency, confrontation naming, and visuospatial abilities. Performances were appropriate relative to age-matched peers across processing speed, basic attention, and phonemic fluency. Functionally, [name] denied difficulties completing instrumental activities of daily living (ADLs) independently. Her husband was present and did not contradict her reporting. As such, given evidence for cognitive dysfunction described above, she meets criteria for a Mild Neurocognitive Disorder ("mild cognitive impairment"). However, given the severity of ongoing impairments, she is certainly towards the severe end of this spectrum and at risk of transitioning to a dementia designation over the next several years.    Regarding etiology, I unfortunately have concerns surrounding the presence of a neurodegenerative illness, namely Alzheimer's disease. Across memory testing, [name] did not benefit from repeated exposure to new information, was fully amnestic (i.e., 0% retention) after a brief delay across all memory tasks, and performed very poorly across yes/no recognition trials.  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 Further variability and weakness in semantic fluency, confrontation naming, and visuospatial abilities would follow typical disease progression, also elevating concerns. Recent neuroimaging (which was not available at the time of her previous neuropsychological evaluation) revealed more pronounced atrophy in the medial temporal lobes, which is a well established risk factor for the presence of this disease process.    There is undoubtedly a psychiatric component to her current presentation. 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 It is important to highlight than even in instances of high stress and psychiatric confounds, fully amnestic memory and impaired recognition is above and beyond typical testing patterns. These experiences (as well as her COVID-19 vs pneumonia experience in in November 2019) would also not cause medial temporal lobe brain atrophy in a typical Alzheimer's disease pattern. Overall, while there is very likely a psychiatric contribution, I do not believe that this fully accounts for ongoing cognitive decline on its own. Continued medical monitoring will be important moving forward.    Assessment and Plan: 77 y.o. female  with neurocognitive impairment thought to be related to Alzheimer's disease. Patient was originally referred to me with concern for concussion or perhaps long COVID. After extensive trials of treatment and further evaluation I think it is very likely that her main diagnosis is Alzheimer's disease that were catching very early.   She is already displaying early signs of dementia and loss of independence and function including no longer driving or cooking for herself.  I went over the results of the neuropsychological evaluation and said the word Alzheimer's disease out loud.  She was aware that this was a possibility. After discussion she agreed to start Aricept.  She has an appointment scheduled with neurology on April 2.  I have also sent a message to her PCP to make sure that Dr. Randel Pigg is aware.  She can check back with me as needed.  My main role as a sports medicine provider is probably done but happy to be helpful anyway I can.   PDMP not reviewed this encounter. No orders of the defined types were placed in this encounter.  Meds ordered this encounter  Medications   donepezil (ARICEPT) 5 MG tablet    Sig: Take 1 tablet (5 mg total) by mouth at bedtime.    Dispense:  30 tablet    Refill:  3     Discussed warning signs or symptoms. Please see discharge instructions. Patient expresses understanding.   The above documentation has been reviewed and is accurate and complete Dawn Simmons, M.D.  Total encounter time 40 minutes including face-to-face time with the patient and, reviewing past medical record, and charting on the date of service.

## 2022-06-01 ENCOUNTER — Telehealth: Payer: Self-pay | Admitting: Family Medicine

## 2022-06-01 NOTE — Telephone Encounter (Signed)
She called and talked about the Aricept. She is taking a 1/2 pill at a time.  She is feeling a bit better.

## 2022-06-01 NOTE — Telephone Encounter (Signed)
Patient called stating that she would like to talk to Dr Georgina Snell. She said that she feels that she has had very important breakthrough.  Please advise.  Patient can be reached at [phone].

## 2022-06-02 ENCOUNTER — Ambulatory Visit: Payer: Medicare Other | Admitting: Psychology

## 2022-06-06 ENCOUNTER — Telehealth: Payer: Self-pay | Admitting: Family Medicine

## 2022-06-06 NOTE — Telephone Encounter (Signed)
Contacted Dawn Simmons to schedule their annual wellness visit. Appointment made for 06/15/2022.   Sherol Dade; Care Guide Ambulatory Clinical [facility] Direct Dial: [phone]

## 2022-06-07 ENCOUNTER — Encounter: Payer: Self-pay | Admitting: Psychiatry

## 2022-06-07 ENCOUNTER — Ambulatory Visit (INDEPENDENT_AMBULATORY_CARE_PROVIDER_SITE_OTHER): Payer: Medicare Other | Admitting: Psychiatry

## 2022-06-07 DIAGNOSIS — F4310 Post-traumatic stress disorder, unspecified: Secondary | ICD-10-CM

## 2022-06-07 DIAGNOSIS — F32A Depression, unspecified: Secondary | ICD-10-CM | POA: Diagnosis not present

## 2022-06-07 DIAGNOSIS — G4700 Insomnia, unspecified: Secondary | ICD-10-CM

## 2022-06-07 NOTE — Progress Notes (Signed)
Dawn Simmons [id number] Sep 22, 1945 77 y.o.  Virtual Visit via Telephone Note  I connected with pt on 06/07/22 at 10:15 AM EST by telephone and verified that I am speaking with the correct person using two identifiers.   I discussed the limitations, risks, security and privacy concerns of performing an evaluation and management service by telephone and the availability of in person appointments. I also discussed with the patient that there may be a patient responsible charge related to this service. The patient expressed understanding and agreed to proceed.   I discussed the assessment and treatment plan with the patient. The patient was provided an opportunity to ask questions and all were answered. The patient agreed with the plan and demonstrated an understanding of the instructions.   The patient was advised to call back or seek an in-person evaluation if the symptoms worsen or if the condition fails to improve as anticipated.  I provided 40 minutes of non-face-to-face time during this encounter.  The patient was located at home.  The provider was located at home.   Thayer Headings, PMHNP   Subjective:   Patient ID:  Dawn Simmons is a 77 y.o. (DOB April 05, 1946) female.  Chief Complaint:  Chief Complaint  Patient presents with  . Anxiety    HPI Dawn Simmons presents for follow-up of anxiety, depression, and insomnia.  She reports that a medical provider started Donepezil 5 mg po qd. She reports, "I don't know how to explain how horrible it was." She reports that she had disturbing dreams and awakened "cocooned in covers." She then tried 1/2 tab of Aricept 5 mg and reports that she did not sleep. "I didn't take it after that... it's really set me back... I've not been right since. I've been out of sorts." She reports that her memory has not been as good since trial of Aricept.  She reports that she continues to have increased anxiety since trial of aricept. Denies any intrusive  memories. "I'm kind of a nervous wreck." Some rumination about adverse effect with Aricept. She reports increased worry. She reports some possible panic symptoms- "I haven't felt very comfortable since." She reports that she is not sleeping as well since trial of Aricept. She is not aware of any nightmares since first few nights of Aricept. She reports nightmares throughout childhood. She reports feelings of guilt related to not taking Aricept. Denies depressed mood, "I think it's just fear." Lower energy and motivation. She reports difficulty with concentration. She denies anhedonia. She reports her appetite remains low which is consistent with her baseline. Denies SI.   She reports that she stopped therapy sessions since "I was doing so well."   Past Psychiatric Medication Trials: (She reports that she is very sensitive to medication) Diazepam- Prescribed as need with limited improvement.  Prozac- reports that she has to take Prozac 20 mg in divided doses.  Lexapro Aricept- Nightmares, worsening anxiety  Review of Systems:  Review of Systems  Gastrointestinal:        Reports "nervous stomach."  Musculoskeletal:  Negative for gait problem.  Psychiatric/Behavioral:         Please refer to HPI    Medications: I have reviewed the patient's current medications.  Current Outpatient Medications  Medication Sig Dispense Refill  . diazepam (VALIUM) 5 MG tablet Take 1 tablet (5 mg total) by mouth at bedtime. Take 1/2-1 tablet po QHS 90 tablet 0  . escitalopram (LEXAPRO) 10 MG tablet Take 1 tablet (10 mg total) by mouth daily.  90 tablet 0  . Ascorbic Acid (VITAMIN C) 100 MG tablet Take 100 mg by mouth daily.    . Calcium-Magnesium 200-100 MG TABS Take 4 tablets by mouth daily. 2 tablets in the morning and 2 tablets in the evening    . donepezil (ARICEPT) 5 MG tablet Take 1 tablet (5 mg total) by mouth at bedtime. (Patient not taking: Reported on 06/07/2022) 30 tablet 3  . ergocalciferol (DRISDOL)  200 MCG/ML drops     . loratadine (CLARITIN) 10 MG tablet Take 10 mg by mouth daily.    Marland Kitchen MAGNESIUM PO Take 200 mg by mouth at bedtime.    . Multiple Vitamin (MULTIVITAMIN) capsule Take 1 capsule by mouth daily.    Marland Kitchen nystatin (MYCOSTATIN) 100000 UNIT/ML suspension Apply to corners of mouth three x daily 60 mL 1  . Omega-3 1000 MG CAPS Take by mouth.    . triamcinolone cream (KENALOG) 0.1 % Apply 1 application topically 2 (two) times daily. 80 g 1  . TURMERIC PO Take by mouth.     No current facility-administered medications for this visit.    Medication Side Effects: None  Allergies:  Allergies  Allergen Reactions  . Doxycycline Swelling  . Tetanus Toxoids Other (See Comments)    PAIN IN NECK AND FACE  . Flexeril [Cyclobenzaprine] Other (See Comments)    Past Medical History:  Diagnosis Date  . Allergic rhinitis   . 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 Benzodiazepine withdrawal without complication   . Cervical radiculopathy at C6   . Cheilitis   . Chronic fatigue   . Chronic obstructive pulmonary disease 09/07/2018   Quit smoking 1974 with copd changes on cxr but no limiting sob as of 09/06/2018  - 09/06/2018   Walked RA  2 laps @  approx 263f each @ fast pace  stopped due to  End of study, no sob and sats  100% at end  . Fibromyalgia 06/01/2015  . History of chicken pox 09/15/2019  . History of COVID-19 02/05/2020  . History of measles   . History of mumps   . History of rubella   . History of syncope   . Insomnia 06/01/2015  . Iron deficiency anemia   . Left shoulder pain 10/31/2019  . Leukopenia 09/15/2019  . Low back pain 09/08/2021  . Mitral valve prolapse   . Mixed hyperlipidemia 11/04/2021  . Neuritis   . Palpitations   . Pharyngitis   . Psychogenic nonepileptic seizure   . PTSD (post-traumatic stress disorder) 09/15/2019  . Restless legs syndrome   . Right sided weakness 01/10/2020  . Upper airway cough syndrome  09/06/2018   Onset Nov 2019 with uri  - Sinus CT 56/12/2018 neg   . Visual disturbance 11/02/2021  . Vitamin D deficiency   . Von Willebrand disease     Family History  Problem Relation Age of Onset  . Stroke Mother   . Dementia Mother        with advanced age  . Emphysema Father        smoked  . Alcohol abuse Father   . Heart failure Maternal Grandmother   . Heart attack Maternal Grandfather   . Heart failure Paternal Grandmother   . Heart attack Paternal Grandfather   . OCD Son   . Drug abuse Son   . Heart attack Maternal Uncle   . Seizures Neg Hx     Social History   Socioeconomic History  .  Marital status: Married    Spouse name: tony  . Number of children: 4  . Years of education: 50  . Highest education level: High school graduate  Occupational History  . Occupation: Retired  Tobacco Use  . Smoking status: Former    Packs/day: 0.50    Years: 10.00    Total pack years: 5.00    Types: Cigarettes    Quit date: 04/27/1972    Years since quitting: 50.1  . Smokeless tobacco: Never  Vaping Use  . Vaping Use: Never used  Substance and Sexual Activity  . Alcohol use: Yes    Alcohol/week: 0.0 standard drinks of alcohol    Comment: occ 1 glass wine or beer  . Drug use: No  . Sexual activity: Not on file  Other Topics Concern  . Not on file  Social History Narrative   Lives at home with husband   Caffeine use- tea, 1 cup/day   Social Determinants of Health   Financial Resource Strain: Low Risk  (12/13/2019)   Overall Financial Resource Strain (CARDIA)   . Difficulty of Paying Living Expenses: Not hard at all  Food Insecurity: No Food Insecurity (12/13/2019)   Hunger Vital Sign   . Worried About Charity fundraiser in the Last Year: Never true   . Ran Out of Food in the Last Year: Never true  Transportation Needs: No Transportation Needs (02/08/2021)   PRAPARE - Transportation   . Lack of Transportation (Medical): No   . Lack of Transportation (Non-Medical):  No  Physical Activity: Inactive (02/08/2021)   Exercise Vital Sign   . Days of Exercise per Week: 0 days   . Minutes of Exercise per Session: 0 min  Stress: Not on file  Social Connections: Not on file  Intimate Partner Violence: Not At Risk (02/08/2021)   Humiliation, Afraid, Rape, and Kick questionnaire   . Fear of Current or Ex-Partner: No   . Emotionally Abused: No   . Physically Abused: No   . Sexually Abused: No    Past Medical History, Surgical history, Social history, and Family history were reviewed and updated as appropriate.   Please see review of systems for further details on the patient's review from today.   Objective:   Physical Exam:  There were no vitals taken for this visit.  Physical Exam Neurological:     Mental Status: She is alert and oriented to person, place, and time.     Cranial Nerves: No dysarthria.  Psychiatric:        Attention and Perception: Attention and perception normal.        Mood and Affect: Mood is anxious.        Speech: Speech normal.        Behavior: Behavior is cooperative.        Thought Content: Thought content normal. Thought content is not paranoid or delusional. Thought content does not include homicidal or suicidal ideation. Thought content does not include homicidal or suicidal plan.        Cognition and Memory: Memory is impaired.        Judgment: Judgment normal.     Comments: Insight intact    Lab Review:     Component Value Date/Time   NA 140 01/25/2021 1504   K 4.0 01/25/2021 1504   CL 106 01/25/2021 1504   CO2 25 01/25/2021 1504   GLUCOSE 88 01/25/2021 1504   BUN 17 01/25/2021 1504   CREATININE 0.88 01/25/2021 1504  CREATININE 0.97 (H) 01/10/2020 1358   CALCIUM 10.0 01/25/2021 1504   PROT 6.9 01/25/2021 1504   ALBUMIN 4.6 01/25/2021 1504   AST 23 01/25/2021 1504   ALT 15 01/25/2021 1504   ALKPHOS 67 01/25/2021 1504   BILITOT 0.6 01/25/2021 1504       Component Value Date/Time   WBC 4.0 01/25/2021  1504   RBC 4.08 01/25/2021 1504   HGB 12.9 01/25/2021 1504   HCT 38.1 01/25/2021 1504   PLT 272.0 01/25/2021 1504   MCV 93.4 01/25/2021 1504   MCH 31.3 01/10/2020 1358   MCHC 33.9 01/25/2021 1504   RDW 13.1 01/25/2021 1504   LYMPHSABS 1,197 01/10/2020 1358   MONOABS 0.5 10/31/2019 1509   EOSABS 198 01/10/2020 1358   BASOSABS 32 01/10/2020 1358    No results found for: "POCLITH", "LITHIUM"   No results found for: "PHENYTOIN", "PHENOBARB", "VALPROATE", "CBMZ"   .res Assessment: Plan:   Pt seen for 40 minutes and time spent discussing recent symptoms and response to Aricept. She reports that she had a severe nightmare, that may be related to childhood trauma, after taking Aricept and discussed that this seemed to exacerbate some of her PTSD symptoms, as she seems to be having some re-experiencing. Recommend following up with therapist to process nightmare and re-experiencing of childhood emotional response.  She also reports anxiety and guilt in response to not taking prescribed medication and anxiety about whether or not to inform provider that she is no longer taking Aricept. Discussed that this provider would indicate that she is not taking Aricept on her med list. Discussed that she could contact provider to communicate that she is no longer taking Aricept due to adverse effect. Discussed that there are other medications similar to Aricept that have less risk of vivid dreams and nightmares that could also be considered.  Offered option to increase Lexapro to 15 mg daily once again to improve mood and anxiety symptoms, however she declines and reports that she prefers not to make any med changes at this time. She reports that she would prefer to first talk with therapist about recent response.  Will continue Lexapro 10 mg po qd for anxiety and depression.  Continue Diazepam 5 mg 1/2-1 tab po QHS for insomnia and anxiety.  Pt to follow-up with this provider in 4 weeks or sooner if clinically  indicated.  Patient advised to contact office with any questions, adverse effects, or acute worsening in signs and symptoms.  Jeremi was seen today for anxiety.  Diagnoses and all orders for this visit:  PTSD (post-traumatic stress disorder)  Depression, unspecified depression type  Insomnia, unspecified type    Please see After Visit Summary for patient specific instructions.  Future Appointments  Date Time Provider [place]  06/09/2022  3:00 PM Cottle, Lucious Groves, LCSW [facility] None  06/15/2022  2:20 PM [facility] CO[facility]  07/12/2022  1:15 PM Suzzanne Cloud, NP [facility] None  07/21/2022  3:00 PM Mosie Lukes, MD [facility]    No orders of the defined types were placed in this encounter.     -------------------------------

## 2022-06-09 ENCOUNTER — Ambulatory Visit (INDEPENDENT_AMBULATORY_CARE_PROVIDER_SITE_OTHER): Payer: Medicare Other | Admitting: Psychology

## 2022-06-09 DIAGNOSIS — F4310 Post-traumatic stress disorder, unspecified: Secondary | ICD-10-CM | POA: Diagnosis not present

## 2022-06-09 DIAGNOSIS — F32A Depression, unspecified: Secondary | ICD-10-CM | POA: Diagnosis not present

## 2022-06-09 NOTE — Progress Notes (Signed)
[facility] Counselor/Therapist Progress Note  Patient ID: Dawn Simmons, MRN: [id number],    Date: 06/09/2022  Time Spent: 60 minutes  Treatment Type: Individual Therapy  Reported Symptoms: anxiety and depression and memory loss  Mental Status Exam: Appearance:  Casual     Behavior: Appropriate  Motor: Normal  Speech/Language:  Normal Rate  Affect: Blunt  Mood: anxious  Thought process: normal  Thought content:   WNL  Sensory/Perceptual disturbances:   WNL  Orientation: oriented to person, place, time/date, and situation  Attention: Good  Concentration: Good  Memory: WNL  Fund of knowledge:  Good  Insight:   Good  Judgment:  Good  Impulse Control: Good   Risk Assessment: Danger to Self:  No Self-injurious Behavior: No Danger to Others: No Duty to Warn:no Physical Aggression / Violence:No  Access to Firearms a concern: No  Gang Involvement:No   Subjective: The patient attended a face-to-face individual therapy session in the office today.  The patient presents with a blunted affect and mood is anxious.  The patient reports that she had to make an appointment because she was not doing well.  She reported that she went to see Dr. Georgina Snell and he gave her some Aricept to take.  She reports that she took it 1 night and woke up and was all wrapped up in her bed clothing and had either a hallucination or thought her mother was there in the room with her.  She reports that she took the half the next night and she stayed up all night.  It seems that maybe she has gotten herself worked up about taking this medicine or even having to tell Dr. Georgina Snell that she does not want to take it.  We did the EMDR today to help her calm herself down as she was thinking negative and she was thinking that something was wrong with her and she would continue to have problems moving forward.  We targeted "I am trapped".  We installed "I am safe and in control".  We also did some work around her  feeling safe based on her [place] beliefs.  The patient reported feeling much better after the session and we scheduled for next week just to do a check in to make sure she is still doing okay.   Interventions: Cognitive Behavioral Therapy and Eye Movement Desensitization and Reprocessing (EMDR)  Diagnosis:PTSD (post-traumatic stress disorder)  Depression, unspecified depression type  Plan: Treatment Plan  Strengths/Abilities:Insightful, motivated, supportive husband  Treatment Preferences: Outpatient Individual therapy  Statement of Needs: "I have a problem passing out and I was told I have PTSD"    Symptoms:  Demonstrates an exaggerated startle response.:(Status: improved). Depressed  or irritable mood.: (Status: improved). Describes a reliving of the event,  particularly through dissociative flashbacks.:  (Status: improved). Displays a  significant decline in interest and engagement in activities.: (Status: improved). Displays significant psychological and/or physiological distress resulting from internal and external  clues that are reminiscent of the traumatic event.: (Status: improved).  Experiences disturbances in sleep.: (Status: improved). Experiences disturbing  and persistent thoughts, images, and/or perceptions of the traumatic event.: (Status: improved). Experiences frequent nightmares.: (Status: improved).  Feelings of hopelessness, worthlessness, or inappropriate guilt.: (Status:  improved). Has been exposed to a traumatic event involving actual or perceived threat of death or  serious injury.: (Status: maintained). Impairment in social, occupational, or  other areas of functioning.: (Status: improved). Intentionally avoids activities,  places, people, or objects (e.g., up-armored vehicles) that evoke memories of  the event.:(Status: maintained). Intentionally avoids thoughts, feelings, or discussions related  to the traumatic event.: (Status: improved). Reports  difficulty concentrating as  well as feelings of guilt.:  (Status: improved). Reports response of intense fear,  helplessness, or horror to the traumatic event.:  (Status: improved).  Problems Addressed: PTSD   Goals:  LTG:  1. Develop healthy thinking patterns and beliefs about self, others, and the world that lead to the alleviation and help prevent the relapse of  depression.  80% Objective Identify and replace thoughts and beliefs that support depression.  80 % Target Date: 2023-05-02 Frequency: biWeekly Progress: 70 Modality: individual 2. Eliminate or reduce the negative impact trauma related symptoms have  on social, occupational, and family functioning.  80% Objective Learn and implement personal skills to manage challenging situations related to trauma. Target Date: 2023-05-02 Frequency: biWeekly Progress: 70 Modality: individual3. No longer avoids persons, places, activities, and objects that are  reminiscent of the traumatic event. Objective 3.  Participate in Eye Movement Desensitization and Reprocessing (EMDR) to reduce emotional distress  related to traumatic thoughts, feelings, and images. Target Date: 2023-05-02 Frequency: biWeekly Progress: 80 Modality: individual 4.  Learn and implement guided self-dialogue to manage thoughts, feelings, and urges brought on by  encounters with trauma-related situations. Target Date: 2023-05-02 Frequency: biWeekly Progress: 80 Modality: individual 5.  No longer experiences intrusive event recollections, avoidance of event  reminders, intense arousal, or disinterest in activities or  relationships. Target Date:05/02/2023  Frequency : biWeekly Progress 90   Modality: individual 6.Thinks about or openly discusses the traumatic event with others  without experiencing psychological or physiological distress. Target Date : 05/02/2023 Frequency: biWeekly Progress:90   Modality: Individual Interventions by Therapist:  CBT, problem  solving therapy, EMDR, insight oriented.  Patient approved Treatment plan.  Averianna Brugger G Akiya Morr, LCSW

## 2022-06-15 ENCOUNTER — Ambulatory Visit (INDEPENDENT_AMBULATORY_CARE_PROVIDER_SITE_OTHER): Payer: Medicare Other | Admitting: *Deleted

## 2022-06-15 ENCOUNTER — Telehealth: Payer: Self-pay | Admitting: Family Medicine

## 2022-06-15 DIAGNOSIS — Z0000 Encounter for general adult medical examination without abnormal findings: Secondary | ICD-10-CM

## 2022-06-15 NOTE — Telephone Encounter (Signed)
Forwarding to Dr. Georgina Snell to review and advise.

## 2022-06-15 NOTE — Telephone Encounter (Signed)
Pt started the Aricept. The first night she had horrific nightmares, the second night she took a half dose and was unable to sleep. She feels like she "is not the same person she was before she took this medicine". Will not continue, wondering what the next plan is.

## 2022-06-15 NOTE — Progress Notes (Signed)
Subjective:   Dawn Simmons is a 77 y.o. female who presents for Medicare Annual (Subsequent) preventive examination.  I connected with  Marlou Sa on 06/15/22 by a audio enabled telemedicine application and verified that I am speaking with the correct person using two identifiers.  Patient Location: Home  Provider Location: Office/Clinic  I discussed the limitations of evaluation and management by telemedicine. The patient expressed understanding and agreed to proceed.   Review of Systems     Cardiac Risk Factors include: advanced age (>51mn, >>44women);dyslipidemia     Objective:    There were no vitals filed for this visit. There is no height or weight on file to calculate BMI.     06/15/2022    2:27 PM 09/15/2021    2:51 PM 02/08/2021    3:47 PM 12/13/2019   11:25 AM  Advanced Directives  Does Patient Have a Medical Advance Directive? No Yes No Yes  Type of ACorporate treasurerof ACarlyssLiving will  HLamarLiving will  Does patient want to make changes to medical advance directive?    No - Patient declined  Copy of HEmporiain Chart?  No - copy requested  No - copy requested  Would patient like information on creating a medical advance directive? No - Patient declined       Current Medications (verified) Outpatient Encounter Medications as of 06/15/2022  Medication Sig   Ascorbic Acid (VITAMIN C) 100 MG tablet Take 100 mg by mouth daily.   Calcium-Magnesium 200-100 MG TABS Take 4 tablets by mouth daily. 2 tablets in the morning and 2 tablets in the evening   diazepam (VALIUM) 5 MG tablet Take 1 tablet (5 mg total) by mouth at bedtime. Take 1/2-1 tablet po QHS   ergocalciferol (DRISDOL) 200 MCG/ML drops    escitalopram (LEXAPRO) 10 MG tablet Take 1 tablet (10 mg total) by mouth daily.   loratadine (CLARITIN) 10 MG tablet Take 10 mg by mouth daily.   MAGNESIUM PO Take 200 mg by mouth at bedtime.   Multiple  Vitamin (MULTIVITAMIN) capsule Take 1 capsule by mouth daily.   nystatin (MYCOSTATIN) 100000 UNIT/ML suspension Apply to corners of mouth three x daily   Omega-3 1000 MG CAPS Take by mouth.   triamcinolone cream (KENALOG) 0.1 % Apply 1 application topically 2 (two) times daily.   TURMERIC PO Take by mouth.   [DISCONTINUED] donepezil (ARICEPT) 5 MG tablet Take 1 tablet (5 mg total) by mouth at bedtime. (Patient not taking: Reported on 06/07/2022)   No facility-administered encounter medications on file as of 06/15/2022.    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   Benzodiazepine withdrawal without complication    Cervical radiculopathy at C6    Cheilitis    Chronic fatigue    Chronic obstructive pulmonary disease 09/07/2018   Quit smoking 1974 with copd changes on cxr but no limiting sob as of 09/06/2018  - 09/06/2018   Walked RA  2 laps @  approx 2516feach @ fast pace  stopped due to  End of study, no sob and sats  100% at end   Fibromyalgia 06/01/2015   History of chicken pox 09/15/2019   History of COVID-19 02/05/2020   History of measles    History of mumps    History of  rubella    History of syncope    Insomnia 06/01/2015   Iron deficiency anemia    Left shoulder pain 10/31/2019   Leukopenia 09/15/2019   Low back pain 09/08/2021   Mitral valve prolapse    Mixed hyperlipidemia 11/04/2021   Neuritis    Palpitations    Pharyngitis    Psychogenic nonepileptic seizure    PTSD (post-traumatic stress disorder) 09/15/2019   Restless legs syndrome    Right sided weakness 01/10/2020   Upper airway cough syndrome 09/06/2018   Onset Nov 2019 with uri  - Sinus CT 56/12/2018 neg    Visual disturbance 11/02/2021   Vitamin D deficiency    Von Willebrand disease    Past Surgical History:  Procedure  Laterality Date   NO PAST SURGERIES     Family History  Problem Relation Age of Onset   Stroke Mother    Dementia Mother        with advanced age   Emphysema Father        smoked   Alcohol abuse Father    Heart failure Maternal Grandmother    Heart attack Maternal Grandfather    Heart failure Paternal Grandmother    Heart attack Paternal Grandfather    OCD Son    Drug abuse Son    Heart attack Maternal Uncle    Seizures Neg Hx    Social History   Socioeconomic History   Marital status: Married    Spouse name: tony   Number of children: 4   Years of education: 12   Highest education level: High school graduate  Occupational History   Occupation: Retired  Tobacco Use   Smoking status: Former    Packs/day: 0.50    Years: 10.00    Total pack years: 5.00    Types: Cigarettes    Quit date: 04/27/1972    Years since quitting: 50.1   Smokeless tobacco: Never  Vaping Use   Vaping Use: Never used  Substance and Sexual Activity   Alcohol use: Yes    Alcohol/week: 0.0 standard drinks of alcohol    Comment: occ 1 glass wine or beer   Drug use: No   Sexual activity: Not on file  Other Topics Concern   Not on file  Social History Narrative   Lives at home with husband   Caffeine use- tea, 1 cup/day   Social Determinants of Health   Financial Resource Strain: Low Risk  (06/15/2022)   Overall Financial Resource Strain (CARDIA)    Difficulty of Paying Living Expenses: Not hard at all  Food Insecurity: No Food Insecurity (06/15/2022)   Hunger Vital Sign    Worried About Running Out of Food in the Last Year: Never true    Ran Out of Food in the Last Year: Never true  Transportation Needs: No Transportation Needs (06/15/2022)   PRAPARE - Hydrologist (Medical): No    Lack of Transportation (Non-Medical): No  Physical Activity: Inactive (02/08/2021)   Exercise Vital Sign    Days of Exercise per Week: 0 days    Minutes of Exercise per Session: 0 min   Stress: No Stress Concern Present (06/15/2022)   [place]    Feeling of Stress : Not at all  Social Connections: Moderately Isolated (06/15/2022)   Social Connection and Isolation Panel [NHANES]    Frequency of Communication with Friends and Family: Once a week    Frequency of  Social Gatherings with Friends and Family: Never    Attends Religious Services: More than 4 times per year    Active Member of [employer] or Organizations: No    Attends Music therapist: Never    Marital Status: Married    Tobacco Counseling Counseling given: Not Answered   Clinical Intake:  Pre-visit preparation completed: Yes  Pain : No/denies pain  Diabetes: No  How often do you need to have someone help you when you read instructions, pamphlets, or other written materials from your doctor or pharmacy?: 1 - Never  Activities of Daily Living    06/15/2022    2:25 PM  In your present state of health, do you have any difficulty performing the following activities:  Hearing? 0  Vision? 0  Comment wears readers  Difficulty concentrating or making decisions? 1  Walking or climbing stairs? 0  Dressing or bathing? 0  Doing errands, shopping? 1  Comment husband assists  Preparing Food and eating ? Y  Comment husband assists  Using the Toilet? N  In the past six months, have you accidently leaked urine? N  Do you have problems with loss of bowel control? N  Managing your Medications? N  Managing your Finances? Y  Comment husband assists  Housekeeping or managing your Housekeeping? Y  Comment husband assists    Patient Care Team: Mosie Lukes, MD as PCP - General (Family Medicine) Maricela Bo, PT as Physical Therapist (Physical Therapy)  Indicate any recent Medical Services you may have received from other than [facility] providers in the past year (date may be approximate).     Assessment:   This is a routine wellness  examination for [place].  Hearing/Vision screen No results found.  Dietary issues and exercise activities discussed: Current Exercise Habits: The patient does not participate in regular exercise at present, Exercise limited by: None identified   Goals Addressed   None    Depression Screen    06/15/2022    2:33 PM 03/31/2022    1:30 PM 02/14/2022   10:29 AM 01/27/2022   11:06 AM 12/06/2021    3:10 PM 10/19/2021    1:28 PM 09/07/2021    4:15 PM  PHQ 2/9 Scores  PHQ - 2 Score 0 0 0 2 0 2 2  PHQ- 9 Score    4 0 13 9    Fall Risk    06/15/2022    2:26 PM 03/31/2022    1:30 PM 02/14/2022   10:29 AM 01/27/2022   11:06 AM 12/06/2021    3:10 PM  Fall Risk   Falls in the past year? 0 0 0 0 0  Number falls in past yr: 0 0 0 0 0  Injury with Fall? 0 0 0 0 0  Risk for fall due to : No Fall Risks      Follow up Falls evaluation completed Falls evaluation completed Falls evaluation completed Falls evaluation completed     [place]:  Any stairs in or around the home? Yes  If so, are there any without handrails? No  Home free of loose throw rugs in walkways, pet beds, electrical cords, etc? Yes  Adequate lighting in your home to reduce risk of falls? Yes   ASSISTIVE DEVICES UTILIZED TO PREVENT FALLS:  Life alert? No  Use of a cane, walker or w/c? No  Grab bars in the bathroom? No  Shower chair or bench in shower? No  Elevated  toilet seat or a handicapped toilet? No   TIMED UP AND GO:  Was the test performed?  No, audio visit .    Cognitive Function:    06/15/2022    2:27 PM  MMSE - Mini Mental State Exam  Not completed: Unable to complete      02/09/2022    2:30 PM 01/06/2022   10:19 AM  Montreal Cognitive Assessment   Visuospatial/ Executive (0/5) 5 5  Naming (0/3) 3 3  Attention: Read list of digits (0/2) 2 2  Attention: Read list of letters (0/1) 1 1  Attention: Serial 7 subtraction starting at 100 (0/3) 2 3  Language: Repeat  phrase (0/2) 2 2  Language : Fluency (0/1) 1 1  Abstraction (0/2) 2 2  Delayed Recall (0/5) 0 0  Orientation (0/6) 4 4  Total 22 23  Adjusted Score (based on education) 22 23      Immunizations Immunization History  Administered Date(s) Administered   Fluad Quad(high Dose 65+) 01/25/2021, 02/14/2022   Influenza Split 02/15/2007, 01/02/2008, 02/05/2010, 02/15/2011, 01/25/2012, 01/09/2013   Influenza,inj,Quad PF,6+ Mos 02/04/2020   Influenza-Unspecified 12/04/2014, 01/05/2016, 12/19/2016, 01/12/2018, 02/08/2019, 02/15/2019   PFIZER(Purple Top)SARS-COV-2 Vaccination 09/19/2019, 10/11/2019   PNEUMOCOCCAL CONJUGATE-20 03/29/2021   Pneumococcal Conjugate-13 11/25/2013   Pneumococcal Polysaccharide-23 04/25/2007    TDAP status: Due, Education has been provided regarding the importance of this vaccine. Advised may receive this vaccine at local pharmacy or Health Dept. Aware to provide a copy of the vaccination record if obtained from local pharmacy or Health Dept. Verbalized acceptance and understanding.  Flu Vaccine status: Up to date  Pneumococcal vaccine status: Up to date  Covid-19 vaccine status: Information provided on how to obtain vaccines.   Qualifies for Shingles Vaccine? Yes   Zostavax completed No   Shingrix Completed?: No.    Education has been provided regarding the importance of this vaccine. Patient has been advised to call insurance company to determine out of pocket expense if they have not yet received this vaccine. Advised may also receive vaccine at local pharmacy or Health Dept. Verbalized acceptance and understanding.  Screening Tests Health Maintenance  Topic Date Due   DTaP/Tdap/Td (1 - Tdap) Never done   Zoster Vaccines- Shingrix (1 of 2) Never done   COVID-19 Vaccine (3 - 2023-24 season) 12/10/2021   Medicare Annual Wellness (AWV)  02/08/2022   Fecal DNA (Cologuard)  11/10/2022   Pneumonia Vaccine 33+ Years old  Completed   INFLUENZA VACCINE  Completed    DEXA SCAN  Completed   HPV VACCINES  Aged Out   Hepatitis C Screening  Discontinued    Health Maintenance  Health Maintenance Due  Topic Date Due   DTaP/Tdap/Td (1 - Tdap) Never done   Zoster Vaccines- Shingrix (1 of 2) Never done   COVID-19 Vaccine (3 - 2023-24 season) 12/10/2021   Medicare Annual Wellness (AWV)  02/08/2022    Colorectal cancer screening: Type of screening: Cologuard. Completed 11/10/19. Repeat every 3 years  Mammogram status: No longer required due to pt declined.  Bone Density status: Completed 5/169/22. Results reflect: Bone density results: OSTEOPOROSIS. Repeat every 2 years.  Lung Cancer Screening: (Low Dose CT Chest recommended if Age 40-80 years, 30 pack-year currently smoking OR have quit w/in 15years.) does not qualify.   Additional Screening:  Hepatitis C Screening: does qualify; Completed N/a  Vision Screening: Recommended annual ophthalmology exams for early detection of glaucoma and other disorders of the eye. Is the patient up to date with  their annual eye exam?  Yes  Who is the provider or what is the name of the office in which the patient attends annual eye exams? [facility] If pt is not established with a provider, would they like to be referred to a provider to establish care? No .   Dental Screening: Recommended annual dental exams for proper oral hygiene  Community Resource Referral / Chronic Care Management: CRR required this visit?  No   CCM required this visit?  No      Plan:     I have personally reviewed and noted the following in the patient's chart:   Medical and social history Use of alcohol, tobacco or illicit drugs  Current medications and supplements including opioid prescriptions. Patient is not currently taking opioid prescriptions. Functional ability and status Nutritional status Physical activity Advanced directives List of other physicians Hospitalizations, surgeries, and ER visits in previous 12  months Vitals Screenings to include cognitive, depression, and falls Referrals and appointments  In addition, I have reviewed and discussed with patient certain preventive protocols, quality metrics, and best practice recommendations. A written personalized care plan for preventive services as well as general preventive health recommendations were provided to patient.   Due to this being a telephonic visit, the after visit summary with patients personalized plan was offered to patient via mail or my-chart. Patient would like to access on my-chart.  Beatris Ship, [place]   06/15/2022   Nurse Notes: None

## 2022-06-15 NOTE — Patient Instructions (Signed)
Dawn Simmons , Thank you for taking time to come for your Medicare Wellness Visit. I appreciate your ongoing commitment to your health goals. Please review the following plan we discussed and let me know if I can assist you in the future.     This is a list of the screening recommended for you and due dates:  Health Maintenance  Topic Date Due   DTaP/Tdap/Td vaccine (1 - Tdap) Never done   Zoster (Shingles) Vaccine (1 of 2) Never done   COVID-19 Vaccine (3 - 2023-24 season) 12/10/2021   Cologuard (Stool DNA test)  11/10/2022   Medicare Annual Wellness Visit  06/15/2023   Pneumonia Vaccine  Completed   Flu Shot  Completed   DEXA scan (bone density measurement)  Completed   HPV Vaccine  Aged Out   Hepatitis C Screening: USPSTF Recommendation to screen - Ages 40-79 yo.  Discontinued     Next appointment: Follow up in one year for your annual wellness visit.   Preventive Care 76 Years and Older, Female Preventive care refers to lifestyle choices and visits with your health care provider that can promote health and wellness. What does preventive care include? A yearly physical exam. This is also called an annual well check. Dental exams once or twice a year. Routine eye exams. Ask your health care provider how often you should have your eyes checked. Personal lifestyle choices, including: Daily care of your teeth and gums. Regular physical activity. Eating a healthy diet. Avoiding tobacco and drug use. Limiting alcohol use. Practicing safe sex. Taking low-dose aspirin every day. Taking vitamin and mineral supplements as recommended by your health care provider. What happens during an annual well check? The services and screenings done by your health care provider during your annual well check will depend on your age, overall health, lifestyle risk factors, and family history of disease. Counseling  Your health care provider may ask you questions about your: Alcohol use. Tobacco  use. Drug use. Emotional well-being. Home and relationship well-being. Sexual activity. Eating habits. History of falls. Memory and ability to understand (cognition). Work and work Statistician. Reproductive health. Screening  You may have the following tests or measurements: Height, weight, and BMI. Blood pressure. Lipid and cholesterol levels. These may be checked every 5 years, or more frequently if you are over 32 years old. Skin check. Lung cancer screening. You may have this screening every year starting at age 76 if you have a 30-pack-year history of smoking and currently smoke or have quit within the past 15 years. Fecal occult blood test (FOBT) of the stool. You may have this test every year starting at age 76. Flexible sigmoidoscopy or colonoscopy. You may have a sigmoidoscopy every 5 years or a colonoscopy every 10 years starting at age 76. Hepatitis C blood test. Hepatitis B blood test. Sexually transmitted disease (STD) testing. Diabetes screening. This is done by checking your blood sugar (glucose) after you have not eaten for a while (fasting). You may have this done every 1-3 years. Bone density scan. This is done to screen for osteoporosis. You may have this done starting at age 76. Mammogram. This may be done every 1-2 years. Talk to your health care provider about how often you should have regular mammograms. Talk with your health care provider about your test results, treatment options, and if necessary, the need for more tests. Vaccines  Your health care provider may recommend certain vaccines, such as: Influenza vaccine. This is recommended every year. Tetanus,  diphtheria, and acellular pertussis (Tdap, Td) vaccine. You may need a Td booster every 10 years. Zoster vaccine. You may need this after age 76. Pneumococcal 13-valent conjugate (PCV13) vaccine. One dose is recommended after age 76. Pneumococcal polysaccharide (PPSV23) vaccine. One dose is recommended  after age 76. Talk to your health care provider about which screenings and vaccines you need and how often you need them. This information is not intended to replace advice given to you by your health care provider. Make sure you discuss any questions you have with your health care provider. Document Released: 04/24/2015 Document Revised: 12/16/2015 Document Reviewed: 01/27/2015 Elsevier Interactive Patient Education  2017 [place] Prevention in the Home Falls can cause injuries. They can happen to people of all ages. There are many things you can do to make your home safe and to help prevent falls. What can I do on the outside of my home? Regularly fix the edges of walkways and driveways and fix any cracks. Remove anything that might make you trip as you walk through a door, such as a raised step or threshold. Trim any bushes or trees on the path to your home. Use bright outdoor lighting. Clear any walking paths of anything that might make someone trip, such as rocks or tools. Regularly check to see if handrails are loose or broken. Make sure that both sides of any steps have handrails. Any raised decks and porches should have guardrails on the edges. Have any leaves, snow, or ice cleared regularly. Use sand or salt on walking paths during winter. Clean up any spills in your garage right away. This includes oil or grease spills. What can I do in the bathroom? Use night lights. Install grab bars by the toilet and in the tub and shower. Do not use towel bars as grab bars. Use non-skid mats or decals in the tub or shower. If you need to sit down in the shower, use a plastic, non-slip stool. Keep the floor dry. Clean up any water that spills on the floor as soon as it happens. Remove soap buildup in the tub or shower regularly. Attach bath mats securely with double-sided non-slip rug tape. Do not have throw rugs and other things on the floor that can make you trip. What can I do  in the bedroom? Use night lights. Make sure that you have a light by your bed that is easy to reach. Do not use any sheets or blankets that are too big for your bed. They should not hang down onto the floor. Have a firm chair that has side arms. You can use this for support while you get dressed. Do not have throw rugs and other things on the floor that can make you trip. What can I do in the kitchen? Clean up any spills right away. Avoid walking on wet floors. Keep items that you use a lot in easy-to-reach places. If you need to reach something above you, use a strong step stool that has a grab bar. Keep electrical cords out of the way. Do not use floor polish or wax that makes floors slippery. If you must use wax, use non-skid floor wax. Do not have throw rugs and other things on the floor that can make you trip. What can I do with my stairs? Do not leave any items on the stairs. Make sure that there are handrails on both sides of the stairs and use them. Fix handrails that are broken or loose. Make  sure that handrails are as long as the stairways. Check any carpeting to make sure that it is firmly attached to the stairs. Fix any carpet that is loose or worn. Avoid having throw rugs at the top or bottom of the stairs. If you do have throw rugs, attach them to the floor with carpet tape. Make sure that you have a light switch at the top of the stairs and the bottom of the stairs. If you do not have them, ask someone to add them for you. What else can I do to help prevent falls? Wear shoes that: Do not have high heels. Have rubber bottoms. Are comfortable and fit you well. Are closed at the toe. Do not wear sandals. If you use a stepladder: Make sure that it is fully opened. Do not climb a closed stepladder. Make sure that both sides of the stepladder are locked into place. Ask someone to hold it for you, if possible. Clearly mark and make sure that you can see: Any grab bars or  handrails. First and last steps. Where the edge of each step is. Use tools that help you move around (mobility aids) if they are needed. These include: Canes. Walkers. Scooters. Crutches. Turn on the lights when you go into a dark area. Replace any light bulbs as soon as they burn out. Set up your furniture so you have a clear path. Avoid moving your furniture around. If any of your floors are uneven, fix them. If there are any pets around you, be aware of where they are. Review your medicines with your doctor. Some medicines can make you feel dizzy. This can increase your chance of falling. Ask your doctor what other things that you can do to help prevent falls. This information is not intended to replace advice given to you by your health care provider. Make sure you discuss any questions you have with your health care provider. Document Released: 01/22/2009 Document Revised: 09/03/2015 Document Reviewed: 05/02/2014 Elsevier Interactive Patient Education  2017 [employer].

## 2022-06-16 ENCOUNTER — Ambulatory Visit: Payer: Medicare Other | Admitting: Psychology

## 2022-06-16 NOTE — Telephone Encounter (Signed)
Follow up with Neurology in April. I dont have any other medical options at this time.

## 2022-06-16 NOTE — Telephone Encounter (Signed)
Spoke to pt and relayed Dr. Clovis Riley message.

## 2022-06-17 ENCOUNTER — Ambulatory Visit: Payer: Medicare Other | Admitting: Psychology

## 2022-06-20 ENCOUNTER — Other Ambulatory Visit: Payer: Self-pay | Admitting: Family Medicine

## 2022-06-20 ENCOUNTER — Encounter: Payer: Self-pay | Admitting: Physical Therapy

## 2022-06-20 ENCOUNTER — Telehealth: Payer: Self-pay

## 2022-06-20 MED ORDER — BENZONATATE 100 MG PO CAPS: 100.0000 mg | ORAL_CAPSULE | Freq: Three times a day (TID) | ORAL | 1 refills | Status: DC | PRN

## 2022-06-20 NOTE — Telephone Encounter (Signed)
Initial Comment Caller states she realized she had covid late this afternoon and is not sure of what to do. Caller states she had it five years ago and was very sick from it. Caller states she is experiencing cough, headache, congestion, runny nose, and fatigue. Translation No Nurse Assessment Nurse: Marcello Moores, RN, Noah Delaine Date/Time Eilene Ghazi Time): 06/17/2022 7:02:36 PM Confirm and document reason for call. If symptomatic, describe symptoms. ---Caller states she tested positive for COVID late this afternoon. Caller states she had it five years ago and was very sick from it. She is experiencing cough, headache, congestion, and fatigue. Denies fever. Does the patient have any new or worsening symptoms? ---Yes Will a triage be completed? ---Yes Related visit to physician within the last 2 weeks? ---No Does the PT have any chronic conditions? (i.e. diabetes, asthma, this includes High risk factors for pregnancy, etc.) ---No Is this a behavioral health or substance abuse call? ---No Guidelines Guideline Title Affirmed Question Affirmed Notes Nurse Date/Time ([place] Time) COVID-19 - Diagnosed or Suspected [1] HIGH RISK patient (e.g., weak immune system, age > 46 years, obesity with BMI 30 or higher, pregnant, chronic lung disease or other chronic medical condition) Marcello Moores, RN, Noah Delaine 06/17/2022 7:05:23 PM PLEASE NOTE: All timestamps contained within this report are represented as [place] Standard Time. CONFIDENTIALTY NOTICE: This fax transmission is intended only for the addressee. It contains information that is legally privileged, confidential or otherwise protected from use or disclosure. If you are not the intended recipient, you are strictly prohibited from reviewing, disclosing, copying using or disseminating any of this information or taking any action in reliance on or regarding this information. If you have received this fax in error, please notify us immediately by  telephone so that we can arrange for its return to [place]. Phone: [phone], Toll-Free: [phone], Fax: [phone] Page: 2 of 3 Call Id: [id number] Guidelines Guideline Title Affirmed Question Affirmed Notes Nurse Date/Time Eilene Ghazi Time) AND [2] COVID symptoms (e.g., cough, fever) (Exceptions: Already seen by PCP and no new or worsening symptoms.) Disp. Time Eilene Ghazi Time) Disposition Final User 06/17/2022 7:11:43 PM Call PCP within 24 Hours Yes Lorre Munroe 06/17/2022 7:12:57 PM Paged On Call back to Call [place], [place], [facility] Final Disposition 06/17/2022 7:11:43 PM Call PCP within 24 Hours Yes Marcello Moores, RN, Lenice Llamas Disagree/Comply Comply Caller Understands Yes PreDisposition Did not know what to do Care Advice Given Per Guideline CALL PCP WITHIN 24 HOURS: * IF OFFICE WILL BE CLOSED: I'll page the on-call provider now. EXCEPTION: from 9 pm to 9 am. Since this isn't urgent, we'll hold the page until morning. CARE ADVICE given per COVID-19 - DIAGNOSED OR SUSPECTED (Adult) guideline. CALL BACK IF: * You become worse Comments User: Creed Copper, RN Date/Time Eilene Ghazi Time): 06/17/2022 7:21:36 PM Left message for pt with OCP message and to call back with new/worsening Sx. Paging DoctorName Phone DateTime Result/ Outcome Message Type Notes Shanon Ace - MD [id number] 06/17/2022 7:12:57 PM Paged On Call Back to Call Center Doctor Paged Shanon Ace - MD 06/17/2022 7:19:18 PM Spoke with On Call - General Message Result OCP states that she only recommends Paxlovid for pts who are unvaccinated or high risk. Advised pt to stay well-hydrated and treat symptomatically. Also advises PLEASE NOTE: All timestamps contained within this report are represented as [place] Standard Time. CONFIDENTIALTY NOTICE: This fax transmission is intended only for the addressee. It contains information that is legally privileged, confidential or otherwise protected from use  or disclosure. If you are not  the intended recipient, you are strictly prohibited from reviewing, disclosing, copying using or disseminating any of this information or taking any action in reliance on or regarding this information. If you have received this fax in error, please notify us immediately by telephone so that we can arrange for its return to [place]. Phone: [phone], Toll-Free: [phone], Fax: [phone] Page: 3 of 3 Call Id: [id number] Paging DoctorName Phone DateTime Result/ Outcome Message Type Notes pt to present to the ED if she develops chest pain or SOB

## 2022-06-20 NOTE — Telephone Encounter (Signed)
Pt called to get an update on triage note. She would like to see if pcp could call something in to help with the cough.

## 2022-06-20 NOTE — Therapy (Signed)
[facility] [address], [place], [place], [address] Phone: [phone]   Fax:  [phone]  Patient Details  Name: Keishana Lickteig MRN: [id number] Date of Birth: 1945/05/09 Referring Provider:  No ref. provider found  Encounter Date: 06/20/2022  PHYSICAL THERAPY DISCHARGE SUMMARY  Visits from Start of Care: 9  Current functional level related to goals / functional outcomes: Pt has met 3 of 4 LTGs.  Pain in leg has resolved   Remaining deficits: Occasional pain, lower extremity weakness   Education / Equipment: Educated in HEP   Patient agrees to discharge. Patient goals were partially met. Patient is being discharged due to being pleased with the current functional level.   Frazier Butt., PT 06/20/2022, 3:24 PM  [facility] [address], [place], [place], [address] Phone: [phone]   Fax:  [phone]

## 2022-06-21 ENCOUNTER — Other Ambulatory Visit: Payer: Self-pay

## 2022-06-21 NOTE — Telephone Encounter (Signed)
Called pt was advised medication  Was sent

## 2022-06-30 ENCOUNTER — Ambulatory Visit: Payer: Medicare Other | Admitting: Psychology

## 2022-07-06 ENCOUNTER — Encounter: Payer: Medicare Other | Admitting: Psychology

## 2022-07-06 ENCOUNTER — Ambulatory Visit: Payer: Medicare Other | Admitting: Psychiatry

## 2022-07-07 ENCOUNTER — Encounter: Payer: Self-pay | Admitting: Psychiatry

## 2022-07-07 ENCOUNTER — Ambulatory Visit (INDEPENDENT_AMBULATORY_CARE_PROVIDER_SITE_OTHER): Payer: Medicare Other | Admitting: Psychiatry

## 2022-07-07 DIAGNOSIS — F32A Depression, unspecified: Secondary | ICD-10-CM | POA: Diagnosis not present

## 2022-07-07 DIAGNOSIS — F4310 Post-traumatic stress disorder, unspecified: Secondary | ICD-10-CM

## 2022-07-07 MED ORDER — ESCITALOPRAM OXALATE 10 MG PO TABS: 10.0000 mg | ORAL_TABLET | Freq: Every day | ORAL | 0 refills | Status: DC

## 2022-07-07 NOTE — Progress Notes (Signed)
Dawn Simmons [id number] 1945/06/14 77 y.o.  Subjective:   Patient ID:  Dawn Simmons is a 77 y.o. (DOB [birth year]) female.  Chief Complaint:  Chief Complaint  Patient presents with   Anxiety   Insomnia    Anxiety Symptoms include insomnia.    Insomnia   Dawn Simmons presents to the office today for follow-up of anxiety and insomnia. She reports that she was able to see therapist after adverse effect with Aricept. She reports that nightmares caused her "to be frozen in time." She reports that she had EMDR to help process this "and it helped." Denies any recurrence of nightmares or severe anxiety.   She reports that she is sleeping well with Diazepam after "not sleeping well my whole life." She reports having dreams but not nightmares. She reports that her anxiety has improved and has not yet improved to previous baseline before adverse effects with Aricept. She reports that her mood is affected by the weather. She reports that she has had some depression and frustration in response to recent stressors. She reports grieving some losses. Appetite is consistent with baseline. Energy and motivation have been fair. Denies SI.   She reports that she does not receive visitors. She reports that one of her daughters will come to visit her.   Diazepam last filled 04/10/22.   Past Psychiatric Medication Trials: (She reports that she is very sensitive to medication) Diazepam- Prescribed as need with limited improvement.  Prozac- reports that she has to take Prozac 20 mg in divided doses.  Lexapro Aricept- Nightmares, worsening anxiety   GAD-7    Flowsheet Row Office Visit from 01/27/2022 in [facility] at [facility]  Total GAD-7 Score 0      PHQ2-9    [place] from 06/15/2022 in [facility] at [place] Visit from 03/31/2022 in [facility] at [place]  Visit from 02/14/2022 in [facility] at [place] Visit from 01/27/2022 in [facility] at [place] Visit from 12/06/2021 in [facility] Primary Care at [facility] Total Score 0 0 0 2 0  PHQ-9 Total Score -- -- -- 4 0        Review of Systems:  Review of Systems  Musculoskeletal:  Negative for gait problem.  Neurological:  Negative for tremors.  Psychiatric/Behavioral:  The patient has insomnia.        Please refer to HPI    Medications: I have reviewed the patient's current medications.  Current Outpatient Medications  Medication Sig Dispense Refill   diazepam (VALIUM) 5 MG tablet Take 1 tablet (5 mg total) by mouth at bedtime. Take 1/2-1 tablet po QHS 90 tablet 0   Ascorbic Acid (VITAMIN C) 100 MG tablet Take 100 mg by mouth daily.     benzonatate (TESSALON PERLES) 100 MG capsule Take 1 capsule (100 mg total) by mouth 3 (three) times daily as needed for cough. 40 capsule 1   Calcium-Magnesium 200-100 MG TABS Take 4 tablets by mouth daily. 2 tablets in the morning and 2 tablets in the evening     ergocalciferol (DRISDOL) 200 MCG/ML drops      escitalopram (LEXAPRO) 10 MG tablet Take 1 tablet (10 mg total) by mouth daily. 90 tablet 0   loratadine (CLARITIN) 10 MG tablet Take 10 mg by mouth daily.  MAGNESIUM PO Take 200 mg by mouth at bedtime.     Multiple Vitamin (MULTIVITAMIN) capsule Take 1 capsule by mouth daily.     nystatin (MYCOSTATIN) 100000 UNIT/ML suspension Apply to corners of mouth three x daily 60 mL 1   Omega-3 1000 MG CAPS Take by mouth.     triamcinolone cream (KENALOG) 0.1 % Apply 1 application topically 2 (two) times daily. 80 g 1   TURMERIC PO Take by mouth.     No current facility-administered medications for this visit.    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   Benzodiazepine withdrawal without complication    Cervical radiculopathy at C6    Cheilitis    Chronic fatigue    Chronic obstructive pulmonary disease 09/07/2018   Quit smoking 1974 with copd changes on cxr but no limiting sob as of 09/06/2018  - 09/06/2018   Walked RA  2 laps @  approx 28ft each @ fast pace  stopped due to  End of study, no sob and sats  100% at end   Fibromyalgia 06/01/2015   History of chicken pox 09/15/2019   History of COVID-19 02/05/2020   History of measles    History of mumps    History of rubella    History of syncope    Insomnia 06/01/2015   Iron deficiency anemia    Left shoulder pain 10/31/2019   Leukopenia 09/15/2019   Low back pain 09/08/2021   Mitral valve prolapse    Mixed hyperlipidemia 11/04/2021   Neuritis    Palpitations    Pharyngitis    Psychogenic nonepileptic seizure    PTSD (post-traumatic stress disorder) 09/15/2019   Restless legs syndrome    Right sided weakness 01/10/2020   Upper airway cough syndrome 09/06/2018   Onset Nov 2019 with uri  - Sinus CT 56/12/2018 neg    Visual disturbance 11/02/2021   Vitamin D deficiency    Von Willebrand disease     Past Medical History, Surgical history, Social history, and Family history were reviewed and updated as appropriate.   Please see review of systems for further details on the patient's review from today.   Objective:   Physical Exam:  There were no vitals taken for this visit.  Physical Exam Constitutional:      General: She is not in acute distress. Musculoskeletal:        General: No deformity.  Neurological:     Mental Status: She is alert and oriented to person, place, and time.     Coordination: Coordination normal.  Psychiatric:        Attention  and Perception: Attention and perception normal. She does not perceive auditory or visual hallucinations.        Mood and Affect: Affect is not labile, blunt, angry or inappropriate.        Speech: Speech normal.        Behavior: Behavior normal.        Thought Content: Thought content normal. Thought content is not paranoid or delusional. Thought content does not include homicidal or suicidal ideation. Thought content does not include homicidal or suicidal plan.  Cognition and Memory: Memory is impaired.        Judgment: Judgment normal.     Comments: Insight fair Mood is appropriate to content and affect is congruent     Lab Review:     Component Value Date/Time   NA 140 01/25/2021 1504   K 4.0 01/25/2021 1504   CL 106 01/25/2021 1504   CO2 25 01/25/2021 1504   GLUCOSE 88 01/25/2021 1504   BUN 17 01/25/2021 1504   CREATININE 0.88 01/25/2021 1504   CREATININE 0.97 (H) 01/10/2020 1358   CALCIUM 10.0 01/25/2021 1504   PROT 6.9 01/25/2021 1504   ALBUMIN 4.6 01/25/2021 1504   AST 23 01/25/2021 1504   ALT 15 01/25/2021 1504   ALKPHOS 67 01/25/2021 1504   BILITOT 0.6 01/25/2021 1504       Component Value Date/Time   WBC 4.0 01/25/2021 1504   RBC 4.08 01/25/2021 1504   HGB 12.9 01/25/2021 1504   HCT 38.1 01/25/2021 1504   PLT 272.0 01/25/2021 1504   MCV 93.4 01/25/2021 1504   MCH 31.3 01/10/2020 1358   MCHC 33.9 01/25/2021 1504   RDW 13.1 01/25/2021 1504   LYMPHSABS 1,197 01/10/2020 1358   MONOABS 0.5 10/31/2019 1509   EOSABS 198 01/10/2020 1358   BASOSABS 32 01/10/2020 1358    No results found for: "POCLITH", "LITHIUM"   No results found for: "PHENYTOIN", "PHENOBARB", "VALPROATE", "CBMZ"   .res Assessment: Plan:    Pt seen for 30 minutes and time spent discussing most recent anxiety symptoms and response to therapy. Pt requests a referral for another therapist that provides EMDR. Pt provided with a referral. She reports that she continues to see improvements  in Lexapro and would like to continue Lexapro without changes at this time.  Will continue Lexapro 10 mg po qd for anxiety. Will continue Diazepam 5 mg 1/2-1 tab po QHS for anxiety and insomnia.  Pt to follow-up in 2 months or sooner if clinically indicated.  Patient advised to contact office with any questions, adverse effects, or acute worsening in signs and symptoms.   Dawn Simmons was seen today for anxiety and insomnia.  Diagnoses and all orders for this visit:  PTSD (post-traumatic stress disorder) -     escitalopram (LEXAPRO) 10 MG tablet; Take 1 tablet (10 mg total) by mouth daily.  Depression, unspecified depression type -     escitalopram (LEXAPRO) 10 MG tablet; Take 1 tablet (10 mg total) by mouth daily.     Please see After Visit Summary for patient specific instructions.  Future Appointments  Date Time Provider [place]  07/12/2022  1:15 PM Suzzanne Cloud, NP [facility] None  07/21/2022  3:00 PM Mosie Lukes, MD [facility]  09/01/2022  1:00 PM Thayer Headings, PMHNP [facility] None    No orders of the defined types were placed in this encounter.   -------------------------------

## 2022-07-11 NOTE — Progress Notes (Unsigned)
Patient: Dawn Simmons Date of Birth: 1945/07/02  Reason for Visit: Follow up History from: Patient, husband Primary Neurologist: [place] 77 y.o. 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 -Untolerable side effect of Aricept with nightmares, has been major set back for her mental health  -Continue close follow up with psychiatry, on Lexapro, Valium  -We discussed Namenda, she prefers not to start due to fear of side effect, doesn't want to be on anymore medications, I gave her some information  -Follow up in 6-8 months or sooner if needed    HISTORY OF PRESENT ILLNESS: Today 07/12/22 Had neuropsychological evaluation May 03, 2022 with [name], felt concerning for neurodegenerative illness such as Alzheimer's disease.  Felt undoubtably a psychiatric component to her current presentation, but does not fully account for cognitive decline.  MRI imaging has showed medial temporal brain atrophy in typical Alzheimer's disease pattern.  Recommended repeating in 12 to 18 months.  Had adverse effects of nightmares with Aricept after taking 1 night.  Had significant anxiety from experience with Aricept.  Multiple visits with psychiatry as result. On Valium and Lexapro. Mentions has determined no history of AD in her family. Did EMDR treatment with therapist to help with traumatic experience for Aricept x 4. She improved. Very thankful for her current Lexapro and Valium. Her drivers license expired. They did get COVID in early March. No changes in memory, hasn't given up anything. Feels she is doing well. She is not very active, but does enjoy being outside. Pleased her  sister told her that their mother didn't have AD, she had mini-strokes.   HISTORY  Update 02/09/2022 JM: Patient returns for sooner scheduled visit to review MRI results.  She is accompanied by her husband.  She was previously seen 1 month ago by Dr. Billey Gosling for memory loss since 2017.  MRI brain w/wo contrast completed on 01/31/2022 showed mild generalized cortical atrophy more advanced in the medial temporal lobes significantly progressed compared to 2017 imaging as well as mild chronic microvascular ischemic changes typical for age, otherwise unremarkable.  Lab work for reversible causes normal.  EEG normal.    Feels like her memory has been stable if not improved since prior visit.  She was started on Lexapro by psychiatrist about 1.5 weeks ago and noticed great improvement of her mood as well as some improvement in memory.  Unfortunately, her cat passed away recently (was hit in the road) and has had a set back since then.  She also continues to follow with psychology. She notes some issues with losing train of thought during conversation and forgetting extended family members names.    History provided for reference purposes only Consult visit 01/06/2022 Dr. Billey Gosling: The patient presents for evaluation of memory loss which has been present since 2017.   She has had multiple episodes where she has passed out. Started passing out in 2016. Would pass out multiple times per day. Initially saw Cardiology and underwent a negative cardiac workup. Last episode of syncope was in 2018.   Had an episode of amnesia at [place] a in 2017. Went to lunch with a friend, then put her head on the table and  lost consciousness. Tried to pick her head up several times and was unable to. When she finally came to she did not know who she was, where she was, or where she lived. States she was a "blank slate" when she went home. Confusion improved somewhat and she began to remember who she was and who her family was. However  she feels she has never fully gotten back to baseline since that episode. States she has still forgotten several old memories and had to relearn many things. Had to stop working.   She has persistent cognitive changes but does not feel they have progressed much over time. Continues to struggle with PTSD and depression. Mother of her grandchild recently overdosed and passed away in 2022/08/02 of this year.   Notes that she sees yellow lines whenever she looks at a white background. This is always present.   Had an EEG in August 02, 2015 which showed mild background slowing and was otherwise unremarkable. Brain MRI in 08/02/15 showed age related white matter changes and was otherwise unremarkable.   She underwent a neuropsych evaluation which showed a significant psychological component to impairment in her working memory and recall. An early neurodegenerative process could not be ruled out. She was recommended to undergo repeat neuropsychological testing in 18 months.   [facility] 11/05/21 showed mild cerebral volume loss and chronic small vessel ischemic disease. No acute process. Carotid US was negative for stenosis.   TBI:  No past history of TBI Stroke:  no past history of stroke Seizures:  no known history of seizures, multiple episodes of syncope Sleep: Takes valium to sleep Mood: History of PTSD on Prozac and Valium. She continues to have a depressed mood and frequently brings up her difficult childhood. States she has always had a difficult life. Left home at age 39.   Functional status:  Patient lives with her husband Cooking: Used to [employer] all the time, does not cook anymore.  Husband thinks she probably could start cooking again but seems too anxious to start Driving: She is not currently driving. Has not had issues getting to familiar places Bills: Used to manage finances but has not done as much recently Medications: Sometimes forgets to take her multivitamins Ever left the stove on by accident?: no Getting  lost going to familiar places?: no Forgetting loved ones names?: yes, during episode of amnesia Word finding difficulty? yes   OTHER MEDICAL CONDITIONS: Von Willebrand disease, mitral valve prolapse, HLD, PTSD, migraines  REVIEW OF SYSTEMS: Out of a complete 14 system review of symptoms, the patient complains only of the following symptoms, and all other reviewed systems are negative.  See HPI  ALLERGIES: Allergies  Allergen Reactions   Doxycycline Swelling   Tetanus Toxoids Other (See Comments)    PAIN IN NECK AND FACE   Flexeril [Cyclobenzaprine] Other (See Comments)    HOME MEDICATIONS: Outpatient Medications Prior to Visit  Medication Sig Dispense Refill   Ascorbic Acid (VITAMIN C) 100 MG tablet Take 100 mg by mouth daily.     benzonatate (TESSALON PERLES) 100 MG capsule Take 1 capsule (100 mg total) by mouth 3 (three) times daily as needed for cough. 40 capsule 1   Calcium-Magnesium 200-100 MG TABS Take 4 tablets by mouth daily. 2 tablets in the morning and 2 tablets in the evening     diazepam (VALIUM) 5 MG tablet Take 1 tablet (5 mg total) by mouth at bedtime. Take 1/2-1 tablet po QHS 90 tablet 0   ergocalciferol (DRISDOL) 200  MCG/ML drops      escitalopram (LEXAPRO) 10 MG tablet Take 1 tablet (10 mg total) by mouth daily. 90 tablet 0   MAGNESIUM PO Take 200 mg by mouth at bedtime.     Multiple Vitamin (MULTIVITAMIN) capsule Take 1 capsule by mouth daily.     Omega-3 1000 MG CAPS Take by mouth.     TURMERIC PO Take by mouth.     loratadine (CLARITIN) 10 MG tablet Take 10 mg by mouth daily.     nystatin (MYCOSTATIN) 100000 UNIT/ML suspension Apply to corners of mouth three x daily 60 mL 1   triamcinolone cream (KENALOG) 0.1 % Apply 1 application topically 2 (two) times daily. 80 g 1   No facility-administered medications prior to visit.    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   Benzodiazepine withdrawal without complication    Cervical radiculopathy at C6    Cheilitis    Chronic fatigue    Chronic obstructive pulmonary disease 09/07/2018   Quit smoking 1974 with copd changes on cxr but no limiting sob as of 09/06/2018  - 09/06/2018   Walked RA  2 laps @  approx 251ft each @ fast pace  stopped due to  End of study, no sob and sats  100% at end   Fibromyalgia 06/01/2015   History of chicken pox 09/15/2019   History of COVID-19 02/05/2020   History of measles    History of mumps    History of rubella    History of syncope    Insomnia 06/01/2015   Iron deficiency anemia    Left shoulder pain 10/31/2019   Leukopenia 09/15/2019   Low back pain 09/08/2021   Mitral valve prolapse    Mixed hyperlipidemia 11/04/2021   Neuritis    Palpitations    Pharyngitis    Psychogenic nonepileptic seizure    PTSD (post-traumatic stress disorder) 09/15/2019   Restless legs syndrome    Right sided weakness 01/10/2020   Upper airway cough syndrome 09/06/2018   Onset Nov 2019 with uri  - Sinus CT 56/12/2018 neg    Visual disturbance 11/02/2021   Vitamin D deficiency    Von Willebrand disease     PAST SURGICAL HISTORY: Past Surgical History:  Procedure Laterality Date   NO PAST SURGERIES      FAMILY HISTORY: Family History  Problem Relation Age of Onset   Stroke Mother    Dementia Mother        with advanced age   Emphysema Father        smoked   Alcohol abuse Father    Heart failure Maternal Grandmother    Heart attack Maternal Grandfather    Heart failure Paternal Grandmother    Heart attack Paternal Grandfather    OCD Son    Drug abuse Son    Heart attack Maternal Uncle    Seizures Neg Hx     SOCIAL HISTORY: Social History   Socioeconomic History   Marital status: Married    Spouse name: tony   Number of children: 4   Years of education: 12   Highest education level: High school graduate  Occupational History    Occupation: Retired  Tobacco Use   Smoking status: Former    Packs/day: 0.50    Years: 10.00    Additional pack years: 0.00    Total pack years: 5.00  Types: Cigarettes    Quit date: 04/27/1972    Years since quitting: 50.2   Smokeless tobacco: Never  Vaping Use   Vaping Use: Never used  Substance and Sexual Activity   Alcohol use: Yes    Alcohol/week: 0.0 standard drinks of alcohol    Comment: occ 1 glass wine or beer   Drug use: No   Sexual activity: Not on file  Other Topics Concern   Not on file  Social History Narrative   Lives at home with husband   Caffeine use- tea, 1 cup/day   Social Determinants of Health   Financial Resource Strain: Low Risk  (06/15/2022)   Overall Financial Resource Strain (CARDIA)    Difficulty of Paying Living Expenses: Not hard at all  Food Insecurity: No Food Insecurity (06/15/2022)   Hunger Vital Sign    Worried About Running Out of Food in the Last Year: Never true    Ran Out of Food in the Last Year: Never true  Transportation Needs: No Transportation Needs (06/15/2022)   PRAPARE - Hydrologist (Medical): No    Lack of Transportation (Non-Medical): No  Physical Activity: Inactive (02/08/2021)   Exercise Vital Sign    Days of Exercise per Week: 0 days    Minutes of Exercise per Session: 0 min  Stress: No Stress Concern Present (06/15/2022)   [place]    Feeling of Stress : Not at all  Social Connections: Moderately Isolated (06/15/2022)   Social Connection and Isolation Panel [NHANES]    Frequency of Communication with Friends and Family: Once a week    Frequency of Social Gatherings with Friends and Family: Never    Attends Religious Services: More than 4 times per year    Active Member of [employer] or Organizations: No    Attends Archivist Meetings: Never    Marital Status: Married  Human resources officer Violence: Not At Risk (06/15/2022)    Humiliation, Afraid, Rape, and Kick questionnaire    Fear of Current or Ex-Partner: No    Emotionally Abused: No    Physically Abused: No    Sexually Abused: No    PHYSICAL EXAM  Vitals:   07/12/22 1317  BP: 125/63  Pulse: 73  Weight: 127 lb 8 oz (57.8 kg)  Height: 5\' 3"  (1.6 m)   Body mass index is 22.59 kg/m.    07/12/2022    1:20 PM 02/09/2022    2:30 PM 01/06/2022   10:19 AM  Montreal Cognitive Assessment   Visuospatial/ Executive (0/5) 5 5 5   Naming (0/3) 3 3 3   Attention: Read list of digits (0/2) 2 2 2   Attention: Read list of letters (0/1) 1 1 1   Attention: Serial 7 subtraction starting at 100 (0/3) 3 2 3   Language: Repeat phrase (0/2) 2 2 2   Language : Fluency (0/1) 1 1 1   Abstraction (0/2) 1 2 2   Delayed Recall (0/5) 0 0 0  Orientation (0/6) 3 4 4   Total 21 22 23   Adjusted Score (based on education) 21 22 23     Generalized: Well developed, in no acute distress  Neurological examination  Mentation: Alert oriented to time, place, history taking. Follows all commands speech and language fluent. Talkative. Does repeat herself.  Cranial nerve II-XII: Pupils were equal round reactive to light. Extraocular movements were full, visual field were full on confrontational test. Facial sensation and strength were normal. Head turning and shoulder  shrug  were normal and symmetric. Motor: The motor testing reveals 5 over 5 strength of all 4 extremities. Good symmetric motor tone is noted throughout.  Sensory: Sensory testing is intact to soft touch on all 4 extremities. No evidence of extinction is noted.  Coordination: Cerebellar testing reveals good finger-nose-finger and heel-to-shin bilaterally.  Gait and station: Gait is normal.  Reflexes: Deep tendon reflexes are symmetric and normal bilaterally.   DIAGNOSTIC DATA (LABS, IMAGING, TESTING) - I reviewed patient records, labs, notes, testing and imaging myself where available.  Lab Results  Component Value Date   WBC  4.0 01/25/2021   HGB 12.9 01/25/2021   HCT 38.1 01/25/2021   MCV 93.4 01/25/2021   PLT 272.0 01/25/2021      Component Value Date/Time   NA 140 01/25/2021 1504   K 4.0 01/25/2021 1504   CL 106 01/25/2021 1504   CO2 25 01/25/2021 1504   GLUCOSE 88 01/25/2021 1504   BUN 17 01/25/2021 1504   CREATININE 0.88 01/25/2021 1504   CREATININE 0.97 (H) 01/10/2020 1358   CALCIUM 10.0 01/25/2021 1504   PROT 6.9 01/25/2021 1504   ALBUMIN 4.6 01/25/2021 1504   AST 23 01/25/2021 1504   ALT 15 01/25/2021 1504   ALKPHOS 67 01/25/2021 1504   BILITOT 0.6 01/25/2021 1504   Lab Results  Component Value Date   CHOL 227 (H) 11/03/2021   HDL 62.50 11/03/2021   LDLCALC 127 (H) 11/03/2021   TRIG 189.0 (H) 11/03/2021   CHOLHDL 4 11/03/2021   No results found for: "HGBA1C" Lab Results  Component Value Date   [id number] 01/06/2022   Lab Results  Component Value Date   TSH 2.450 01/06/2022    Butler Denmark, AGNP-C, DNP 07/12/2022, 1:28 PM [facility] [address], [place], [place] [address] [phone]

## 2022-07-12 ENCOUNTER — Ambulatory Visit (INDEPENDENT_AMBULATORY_CARE_PROVIDER_SITE_OTHER): Payer: Medicare Other | Admitting: Neurology

## 2022-07-12 ENCOUNTER — Encounter: Payer: Self-pay | Admitting: Neurology

## 2022-07-12 VITALS — BP 125/63 | HR 73 | Ht 63.0 in | Wt 127.5 lb

## 2022-07-12 DIAGNOSIS — F4310 Post-traumatic stress disorder, unspecified: Secondary | ICD-10-CM

## 2022-07-12 DIAGNOSIS — G309 Alzheimer's disease, unspecified: Secondary | ICD-10-CM

## 2022-07-12 DIAGNOSIS — F0280 Dementia in other diseases classified elsewhere without behavioral disturbance: Secondary | ICD-10-CM

## 2022-07-12 NOTE — Patient Instructions (Addendum)
Do some research on Namenda, we can start for your memory, if you decide to pursue let me know  Recommend exercise, brain stimulating exercises, healthy eating, drink plenty of water   Follow up in 6-8 months

## 2022-07-14 ENCOUNTER — Ambulatory Visit: Payer: Medicare Other | Admitting: Psychology

## 2022-07-18 ENCOUNTER — Encounter: Payer: Medicare Other | Admitting: Psychology

## 2022-07-20 NOTE — Assessment & Plan Note (Signed)
Struggles with chronic pain and fatigue likely associated with this diagnosis. Encouraged to stay as active as able, hydrate well, minimize carbs and increase proteins in the diet

## 2022-07-20 NOTE — Assessment & Plan Note (Signed)
Supplement and monitor 

## 2022-07-21 ENCOUNTER — Encounter: Payer: Self-pay | Admitting: Family Medicine

## 2022-07-21 ENCOUNTER — Ambulatory Visit (INDEPENDENT_AMBULATORY_CARE_PROVIDER_SITE_OTHER): Payer: Medicare Other | Admitting: Family Medicine

## 2022-07-21 VITALS — BP 118/62 | HR 76 | Temp 98.0°F | Resp 16 | Ht 65.0 in | Wt 128.8 lb

## 2022-07-21 DIAGNOSIS — F0280 Dementia in other diseases classified elsewhere without behavioral disturbance: Secondary | ICD-10-CM

## 2022-07-21 DIAGNOSIS — F4310 Post-traumatic stress disorder, unspecified: Secondary | ICD-10-CM

## 2022-07-21 DIAGNOSIS — G309 Alzheimer's disease, unspecified: Secondary | ICD-10-CM | POA: Diagnosis not present

## 2022-07-21 DIAGNOSIS — F0394 Unspecified dementia, unspecified severity, with anxiety: Secondary | ICD-10-CM

## 2022-07-21 DIAGNOSIS — E559 Vitamin D deficiency, unspecified: Secondary | ICD-10-CM | POA: Diagnosis not present

## 2022-07-21 DIAGNOSIS — M797 Fibromyalgia: Secondary | ICD-10-CM

## 2022-07-21 DIAGNOSIS — R058 Other specified cough: Secondary | ICD-10-CM

## 2022-07-21 MED ORDER — BENZONATATE 100 MG PO CAPS: 100.0000 mg | ORAL_CAPSULE | Freq: Three times a day (TID) | ORAL | 1 refills | Status: DC | PRN

## 2022-07-21 NOTE — Assessment & Plan Note (Signed)
She is following with [facility], Margie Ege, NP they tried Donepezil but she did not tolerate it. She felt anxious, confused had bad dreams. She feels better afterwards. But for now she is just on Lexapro and Diazepam. Between imaging and neurocognitive testing they have diagnosed her with Alzheimer's dementia. Her mother had dementia after numerous mimistrokes. She is requesting a referral to neurology Dr Juliane Poot at [facility]. Referral placed

## 2022-07-21 NOTE — Progress Notes (Signed)
Subjective:    Patient ID: Dawn Simmons, female    DOB: Jul 15, 1945, 77 y.o.   MRN: [id number]  Chief Complaint  Patient presents with   Follow-up    Follow up    HPI Patient is in today for follow up on chronic medical concerns. No recent febrile illness or hospitalizations. Denies CP/palp/SOB/HA/congestion/fevers/GI or GU c/o. Taking meds as prescribed. She notes the switch to Lexapro has helped her anxiety but she does acknowledge that her memory is worsening and she forgets words and details more and more. She is with her husband today and he concurs.   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   Benzodiazepine withdrawal without complication    Cervical radiculopathy at C6    Cheilitis    Chronic fatigue    Chronic obstructive pulmonary disease 09/07/2018   Quit smoking 1974 with copd changes on cxr but no limiting sob as of 09/06/2018  - 09/06/2018   Walked RA  2 laps @  approx 285ft each @ fast pace  stopped due to  End of study, no sob and sats  100% at end   Fibromyalgia 06/01/2015   History of chicken pox 09/15/2019   History of COVID-19 02/05/2020   History of measles    History of mumps    History of rubella    History of syncope    Insomnia 06/01/2015   Iron deficiency anemia    Left shoulder pain 10/31/2019   Leukopenia 09/15/2019   Low back pain 09/08/2021   Mitral valve prolapse    Mixed hyperlipidemia 11/04/2021   Neuritis    Palpitations    Pharyngitis    Psychogenic nonepileptic seizure    PTSD (post-traumatic stress disorder) 09/15/2019   Restless legs syndrome    Right sided weakness 01/10/2020   Upper airway cough syndrome 09/06/2018   Onset Nov 2019 with uri  - Sinus CT 56/12/2018 neg    Visual disturbance 11/02/2021   Vitamin D deficiency    Von Willebrand disease     Past Surgical History:  Procedure Laterality Date   NO PAST SURGERIES      Family  History  Problem Relation Age of Onset   Stroke Mother    Dementia Mother        with advanced age   Emphysema Father        smoked   Alcohol abuse Father    Heart failure Maternal Grandmother    Heart attack Maternal Grandfather    Heart failure Paternal Grandmother    Heart attack Paternal Grandfather    OCD Son    Drug abuse Son    Heart attack Maternal Uncle    Seizures Neg Hx     Social History   Socioeconomic History   Marital status: Married    Spouse name: tony   Number of children: 4   Years of education: 12   Highest education level: High school graduate  Occupational History   Occupation: Retired  Tobacco Use   Smoking status: Former    Packs/day: 0.50    Years: 10.00    Additional pack years: 0.00    Total pack years: 5.00    Types: Cigarettes    Quit date: 04/27/1972    Years since quitting: 50.2   Smokeless tobacco: Never  Vaping Use   Vaping Use: Never used  Substance and Sexual Activity   Alcohol use: Yes  Alcohol/week: 0.0 standard drinks of alcohol    Comment: occ 1 glass wine or beer   Drug use: No   Sexual activity: Not on file  Other Topics Concern   Not on file  Social History Narrative   Lives at home with husband   Caffeine use- tea, 1 cup/day   Social Determinants of Health   Financial Resource Strain: Low Risk  (06/15/2022)   Overall Financial Resource Strain (CARDIA)    Difficulty of Paying Living Expenses: Not hard at all  Food Insecurity: No Food Insecurity (06/15/2022)   Hunger Vital Sign    Worried About Running Out of Food in the Last Year: Never true    Ran Out of Food in the Last Year: Never true  Transportation Needs: No Transportation Needs (06/15/2022)   PRAPARE - Administrator, Civil Service (Medical): No    Lack of Transportation (Non-Medical): No  Physical Activity: Inactive (02/08/2021)   Exercise Vital Sign    Days of Exercise per Week: 0 days    Minutes of Exercise per Session: 0 min  Stress: No  Stress Concern Present (06/15/2022)   [employer] of Occupational Health - Occupational Stress Questionnaire    Feeling of Stress : Not at all  Social Connections: Moderately Isolated (06/15/2022)   Social Connection and Isolation Panel [NHANES]    Frequency of Communication with Friends and Family: Once a week    Frequency of Social Gatherings with Friends and Family: Never    Attends Religious Services: More than 4 times per year    Active Member of [employer] or Organizations: No    Attends Banker Meetings: Never    Marital Status: Married  Catering manager Violence: Not At Risk (06/15/2022)   Humiliation, Afraid, Rape, and Kick questionnaire    Fear of Current or Ex-Partner: No    Emotionally Abused: No    Physically Abused: No    Sexually Abused: No    Outpatient Medications Prior to Visit  Medication Sig Dispense Refill   Ascorbic Acid (VITAMIN C) 100 MG tablet Take 100 mg by mouth daily.     Calcium-Magnesium 200-100 MG TABS Take 4 tablets by mouth daily. 2 tablets in the morning and 2 tablets in the evening     diazepam (VALIUM) 5 MG tablet Take 1 tablet (5 mg total) by mouth at bedtime. Take 1/2-1 tablet po QHS 90 tablet 0   ergocalciferol (DRISDOL) 200 MCG/ML drops      escitalopram (LEXAPRO) 10 MG tablet Take 1 tablet (10 mg total) by mouth daily. 90 tablet 0   MAGNESIUM PO Take 200 mg by mouth at bedtime.     Multiple Vitamin (MULTIVITAMIN) capsule Take 1 capsule by mouth daily.     Omega-3 1000 MG CAPS Take by mouth.     TURMERIC PO Take by mouth.     benzonatate (TESSALON PERLES) 100 MG capsule Take 1 capsule (100 mg total) by mouth 3 (three) times daily as needed for cough. 40 capsule 1   No facility-administered medications prior to visit.    Allergies  Allergen Reactions   Aricept [Donepezil Hcl] Other (See Comments)    Nightmares, worsened mental health   Doxycycline Swelling   Tetanus Toxoids Other (See Comments)    PAIN IN NECK AND FACE    Flexeril [Cyclobenzaprine] Other (See Comments)    Review of Systems  Constitutional:  Positive for malaise/fatigue. Negative for fever.  HENT:  Negative for congestion.   Eyes:  Negative for blurred vision.  Respiratory:  Negative for shortness of breath.   Cardiovascular:  Negative for chest pain, palpitations and leg swelling.  Gastrointestinal:  Negative for abdominal pain, blood in stool and nausea.  Genitourinary:  Negative for dysuria and frequency.  Musculoskeletal:  Negative for falls.  Skin:  Negative for rash.  Neurological:  Negative for dizziness, loss of consciousness and headaches.  Endo/Heme/Allergies:  Negative for environmental allergies.  Psychiatric/Behavioral:  Positive for memory loss. Negative for depression. The patient is nervous/anxious.        Objective:    Physical Exam Constitutional:      General: She is not in acute distress.    Appearance: Normal appearance. She is well-developed. She is not toxic-appearing.  HENT:     Head: Normocephalic and atraumatic.     Right Ear: External ear normal.     Left Ear: External ear normal.     Nose: Nose normal.  Eyes:     General:        Right eye: No discharge.        Left eye: No discharge.     Conjunctiva/sclera: Conjunctivae normal.  Neck:     Thyroid: No thyromegaly.  Cardiovascular:     Rate and Rhythm: Normal rate and regular rhythm.     Heart sounds: Normal heart sounds. No murmur heard. Pulmonary:     Effort: Pulmonary effort is normal. No respiratory distress.     Breath sounds: Normal breath sounds.  Abdominal:     General: Bowel sounds are normal.     Palpations: Abdomen is soft.     Tenderness: There is no abdominal tenderness. There is no guarding.  Musculoskeletal:        General: Normal range of motion.     Cervical back: Neck supple.  Lymphadenopathy:     Cervical: No cervical adenopathy.  Skin:    General: Skin is warm and dry.  Neurological:     Mental Status: She is alert and  oriented to person, place, and time.  Psychiatric:        Mood and Affect: Mood normal.        Behavior: Behavior normal.        Thought Content: Thought content normal.        Judgment: Judgment normal.     BP 118/62 (BP Location: Right Arm, Patient Position: Sitting, Cuff Size: Normal)   Pulse 76   Temp 98 F (36.7 C) (Oral)   Resp 16   Ht 5\' 5"  (1.651 m)   Wt 128 lb 12.8 oz (58.4 kg)   SpO2 98%   BMI 21.43 kg/m  Wt Readings from Last 3 Encounters:  07/21/22 128 lb 12.8 oz (58.4 kg)  07/12/22 127 lb 8 oz (57.8 kg)  05/30/22 130 lb (59 kg)    Diabetic Foot Exam - Simple   No data filed    Lab Results  Component Value Date   WBC 4.0 01/25/2021   HGB 12.9 01/25/2021   HCT 38.1 01/25/2021   PLT 272.0 01/25/2021   GLUCOSE 88 01/25/2021   CHOL 227 (H) 11/03/2021   TRIG 189.0 (H) 11/03/2021   HDL 62.50 11/03/2021   LDLCALC 127 (H) 11/03/2021   ALT 15 01/25/2021   AST 23 01/25/2021   NA 140 01/25/2021   K 4.0 01/25/2021   CL 106 01/25/2021   CREATININE 0.88 01/25/2021   BUN 17 01/25/2021   CO2 25 01/25/2021   TSH 2.450 01/06/2022  Lab Results  Component Value Date   TSH 2.450 01/06/2022   Lab Results  Component Value Date   WBC 4.0 01/25/2021   HGB 12.9 01/25/2021   HCT 38.1 01/25/2021   MCV 93.4 01/25/2021   PLT 272.0 01/25/2021   Lab Results  Component Value Date   NA 140 01/25/2021   K 4.0 01/25/2021   CO2 25 01/25/2021   GLUCOSE 88 01/25/2021   BUN 17 01/25/2021   CREATININE 0.88 01/25/2021   BILITOT 0.6 01/25/2021   ALKPHOS 67 01/25/2021   AST 23 01/25/2021   ALT 15 01/25/2021   PROT 6.9 01/25/2021   ALBUMIN 4.6 01/25/2021   CALCIUM 10.0 01/25/2021   GFR 64.86 01/25/2021   Lab Results  Component Value Date   CHOL 227 (H) 11/03/2021   Lab Results  Component Value Date   HDL 62.50 11/03/2021   Lab Results  Component Value Date   LDLCALC 127 (H) 11/03/2021   Lab Results  Component Value Date   TRIG 189.0 (H) 11/03/2021    Lab Results  Component Value Date   CHOLHDL 4 11/03/2021   No results found for: "HGBA1C"     Assessment & Plan:  Fibromyalgia Assessment & Plan: Struggles with chronic pain and fatigue likely associated with this diagnosis. Encouraged to stay as active as able, hydrate well, minimize carbs and increase proteins in the diet   Vitamin D deficiency Assessment & Plan: Supplement and monitor    Alzheimer's disease Assessment & Plan: She is following with [facility], Margie EgeSarah Slack, NP they tried Donepezil but she did not tolerate it. She felt anxious, confused had bad dreams. She feels better afterwards. But for now she is just on Lexapro and Diazepam. Between imaging and neurocognitive testing they have diagnosed her with Alzheimer's dementia. Her mother had dementia after numerous mimistrokes. She is requesting a [facility]erral placed    Upper airway cough syndrome Assessment & Plan: Responds well to [employer]. Refill given   Dementia with anxiety, unspecified dementia severity, unspecified dementia type -     Ambulatory referral to Neurology  PTSD (post-traumatic stress disorder) Assessment & Plan: Doing better on Lexapro   Other orders -     Benzonatate; Take 1 capsule (100 mg total) by mouth 3 (three) times daily as needed for cough.  Dispense: 40 capsule; Refill: 1    Danise EdgeStacey Tyera Hansley, MD

## 2022-07-21 NOTE — Patient Instructions (Signed)
Dementia Dementia is a condition that affects the way the brain functions. It often affects memory and thinking. Usually, dementia gets worse with time and cannot be reversed (progressive dementia). There are many types of dementia, including: Alzheimer's disease. This type is the most common. Vascular dementia. This type may happen as the result of a stroke. Lewy body dementia. This type may happen to people who have Parkinson's disease. Frontotemporal dementia. This type is caused by damage to nerve cells (neurons) in certain parts of the brain. Some people may be affected by more than one type of dementia. This is called mixed dementia. What are the causes? Dementia is caused by damage to cells in the brain. The area of the brain and the types of cells damaged determine the type of dementia. Usually, this damage is irreversible or cannot be undone. Some examples of irreversible causes include: Conditions that affect the blood vessels of the brain, such as diabetes, heart disease, or blood vessel disease. Genetic mutations. In some cases, changes in the brain may be caused by another condition and can be reversed or slowed. Some examples of reversible causes include: Injury to the brain. Certain medicines. Infection, such as meningitis. Metabolic problems, such as vitamin B12 deficiency or thyroid disease. Pressure on the brain, such as from a tumor, blood clot, or too much fluid in the brain (hydrocephalus). Autoimmune diseases that affect the brain or arteries, such as limbic encephalitis or vasculitis. What are the signs or symptoms? Symptoms of dementia depend on the type of dementia. Common signs of dementia include problems with remembering, thinking, problem solving, decision making, and communicating. These signs develop slowly or get worse with time. This may include: Problems remembering events or people. Having trouble taking a bath or putting clothes on. Forgetting appointments or  forgetting to pay bills. Difficulty planning and preparing meals. Having trouble speaking. Getting lost easily. Changes in behavior or mood. How is this diagnosed? This condition is diagnosed by a specialist (neurologist). It is diagnosed based on the history of your symptoms, your medical history, a physical exam, and tests. Tests may include: Tests to evaluate brain function, such as memory tests, cognitive tests, and other tests. Lab tests, such as blood or urine tests. Imaging tests, such as a CT scan, a PET scan, or an MRI. Genetic testing. This may be done if other family members have a diagnosis of certain types of dementia. Your health care provider will talk with you and your family, friends, or caregivers about your history and symptoms. How is this treated? Treatment for this condition depends on the cause of the dementia. Progressive dementias, such as Alzheimer's disease, cannot be cured, but there may be treatments that help to manage symptoms. Treatment might involve taking medicines that may help to: Control the dementia. Slow down the progression of the dementia. Manage symptoms. In some cases, treating the cause of your dementia can improve symptoms, reverse symptoms, or slow down how quickly your dementia becomes worse. Your health care provider can direct you to support groups, organizations, and other health care providers who can help with decisions about your care. Follow these instructions at home: Medicines Take over-the-counter and prescription medicines only as told by your health care provider. Use a pill organizer or pill reminder to help you manage your medicines. Avoid taking medicines that can affect thinking, such as pain medicines or sleeping medicines. Lifestyle Make healthy lifestyle choices. Be physically active as told by your health care provider. Do not use any   products that contain nicotine or tobacco, such as cigarettes, e-cigarettes, and chewing  tobacco. If you need help quitting, ask your health care provider. Do not drink alcohol. Practice stress-management techniques when you get stressed. Spend time with other people. Make sure to get quality sleep. These tips can help you get a good night's rest: Avoid napping during the day. Keep your sleeping area dark and cool. Avoid exercising during the few hours before you go to bed. Avoid caffeine products in the evening. Eating and drinking Drink enough fluid to keep your urine pale yellow. Eat a healthy diet. General instructions  Work with your health care provider to determine what you need help with and what your safety needs are. Talk with your health care provider about whether it is safe for you to drive. If you were given a bracelet that identifies you as a person with memory loss or tracks your location, make sure to wear it at all times. Work with your family to make important decisions, such as advance directives, medical power of attorney, or a living will. Keep all follow-up visits. This is important. Where to find more information Alzheimer's Association: www.alz.org National Institute on Aging: www.nia.nih.gov/alzheimers World Health Organization: www.who.int Contact a health care provider if: You have any new or worsening symptoms. You have problems with choking or swallowing. Get help right away if: You feel depressed or sad, or feel that you want to harm yourself. Your family members become concerned for your safety. If you ever feel like you may hurt yourself or others, or have thoughts about taking your own life, get help right away. Go to your nearest emergency department or: Call your local emergency services (911 in the [place] Call a suicide crisis helpline, such as the National Suicide Prevention Lifeline at 1-800-273-8255 or 988 in the [place] This is open 24 hours a day in the [place] Text the Crisis Text Line at 741741 (in the [place] Summary Dementia is a  condition that affects the way the brain functions. Dementia often affects memory and thinking. Usually, dementia gets worse with time and cannot be reversed (progressive dementia). Treatment for this condition depends on the cause of the dementia. Work with your health care provider to determine what you need help with and what your safety needs are. Your health care provider can direct you to support groups, organizations, and other health care providers who can help with decisions about your care. This information is not intended to replace advice given to you by your health care provider. Make sure you discuss any questions you have with your health care provider. Document Revised: 10/21/2020 Document Reviewed: 08/12/2019 Elsevier Patient Education  2023 [employer]  

## 2022-07-21 NOTE — Assessment & Plan Note (Signed)
Doing better on Lexapro

## 2022-07-21 NOTE — Assessment & Plan Note (Signed)
Responds well to [employer]. Refill given

## 2022-07-28 ENCOUNTER — Ambulatory Visit: Payer: Medicare Other | Admitting: Psychology

## 2022-08-11 ENCOUNTER — Ambulatory Visit: Payer: Medicare Other | Admitting: Psychology

## 2022-08-29 DIAGNOSIS — G309 Alzheimer's disease, unspecified: Secondary | ICD-10-CM | POA: Diagnosis not present

## 2022-08-29 DIAGNOSIS — R413 Other amnesia: Secondary | ICD-10-CM | POA: Diagnosis not present

## 2022-08-29 DIAGNOSIS — F0280 Dementia in other diseases classified elsewhere without behavioral disturbance: Secondary | ICD-10-CM | POA: Diagnosis not present

## 2022-09-01 ENCOUNTER — Encounter: Payer: Self-pay | Admitting: Psychiatry

## 2022-09-01 ENCOUNTER — Ambulatory Visit (INDEPENDENT_AMBULATORY_CARE_PROVIDER_SITE_OTHER): Payer: Medicare Other | Admitting: Psychiatry

## 2022-09-01 DIAGNOSIS — F4310 Post-traumatic stress disorder, unspecified: Secondary | ICD-10-CM | POA: Diagnosis not present

## 2022-09-01 DIAGNOSIS — F32A Depression, unspecified: Secondary | ICD-10-CM

## 2022-09-01 DIAGNOSIS — G4700 Insomnia, unspecified: Secondary | ICD-10-CM

## 2022-09-01 MED ORDER — ESCITALOPRAM OXALATE 10 MG PO TABS: 10.0000 mg | ORAL_TABLET | Freq: Every day | ORAL | 0 refills | Status: DC

## 2022-09-01 NOTE — Progress Notes (Signed)
[name] [id number] 25-Sep-1945 77 y.o.  Subjective:   Patient ID:  Dawn Simmons is a 77 y.o. (DOB 1945/05/31) female.  Chief Complaint:  Chief Complaint  Patient presents with   Follow-up    Anxiety, depression, insomnia    HPI Lauraashley Bonnet presents to the office today for follow-up of anxiety, depression, and insomnia. She reports that she saw Dr. Antonietta Barcelona on 08/29/22 for evaluation. She reports that after the visit she decided, "I'm going to start doing all the things I haven't been doing" since she started having cognitive changes. She reports that she recently cooked supper for the first time in 5 years. Yesterday she cleaned the entire lower floor of her house and listened to music while she cleaned, like she has in the past- "it was just like I used to be." She reports enjoying herself while doing these activities. "For the first time in awhile, I felt really close to the [employer] improved mood, energy, and motivation. She reports anxiety "has been fine for the last 3 days." She reports, "I feel like I have made a turn." She reports that she was able to remember all of the lyrics to the music she was listening to. She reports that she was able to concentrate while cooking. She denies any change in her appetite and reports that she has always had minimal appetite. Denies SI.  She has started walking daily.   "I'm going to try really hard to be who I was."  She reports that Mother's Day "was a turning point for me." She had lunch with her 3 daughters, and "it was so much fun."  She has been enjoying her garden. She would like to get a dog.   Reports taking 1/4-1/2 tab of Diazepam.  Past Psychiatric Medication Trials: (She reports that she is very sensitive to medication) Diazepam- Prescribed as need with limited improvement.  Prozac- reports that she has to take Prozac 20 mg in divided doses.  Lexapro Aricept- Nightmares, worsening anxiety  GAD-7    Flowsheet Row  Office Visit from 01/27/2022 in [facility] at [facility]  Total GAD-7 Score 0      PHQ2-9    Flowsheet Row Clinical Support from 06/15/2022 in [facility] at [facility] Office Visit from 03/31/2022 in [facility] Primary Care at [facility] Office Visit from 02/14/2022 in [facility] at [facility] Office Visit from 01/27/2022 in [facility] Care at [facility] Office Visit from 12/06/2021 in [facility] Care at [facility] Total Score 0 0 0 2 0  PHQ-9 Total Score -- -- -- 4 0        Review of Systems:  Review of Systems  Respiratory:  Negative for cough.   Musculoskeletal:  Negative for gait problem.  Psychiatric/Behavioral:         Please refer to HPI    Medications: I have reviewed the patient's current medications.  Current Outpatient Medications  Medication Sig Dispense Refill   diazepam (VALIUM) 5 MG tablet Take 1 tablet (5 mg total) by mouth at bedtime. Take 1/2-1 tablet po QHS 90 tablet 0   Ascorbic Acid (VITAMIN C) 100 MG tablet Take 100 mg by mouth daily.     benzonatate (TESSALON PERLES) 100 MG capsule Take 1 capsule (100 mg total) by mouth 3 (three) times daily as needed for cough. (Patient not taking:  Reported on 09/01/2022) 40 capsule 1   Calcium-Magnesium 200-100 MG TABS Take 4 tablets by mouth daily. 2 tablets in the morning and 2 tablets in the evening     ergocalciferol (DRISDOL) 200 MCG/ML drops      escitalopram (LEXAPRO) 10 MG tablet Take 1 tablet (10 mg total) by mouth daily. 90 tablet 0   MAGNESIUM PO Take 200 mg by mouth at bedtime.     Multiple Vitamin (MULTIVITAMIN) capsule Take 1 capsule by mouth daily.     Omega-3 1000 MG CAPS Take by mouth.     TURMERIC PO Take by mouth.     No current facility-administered medications for this visit.    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   Benzodiazepine withdrawal without complication    Cervical radiculopathy at C6    Cheilitis    Chronic fatigue    Chronic obstructive pulmonary disease 09/07/2018   Quit smoking 1974 with copd changes on cxr but no limiting sob as of 09/06/2018  - 09/06/2018   Walked RA  2 laps @  approx 270ft each @ fast pace  stopped due to  End of study, no sob and sats  100% at end   Fibromyalgia 06/01/2015   History of chicken pox 09/15/2019   History of COVID-19 02/05/2020   History of measles    History of mumps    History of rubella    History of syncope    Insomnia 06/01/2015   Iron deficiency anemia    Left shoulder pain 10/31/2019   Leukopenia 09/15/2019   Low back pain 09/08/2021   Mitral valve prolapse    Mixed hyperlipidemia 11/04/2021   Neuritis    Palpitations    Pharyngitis    Psychogenic nonepileptic seizure    PTSD (post-traumatic stress disorder) 09/15/2019   Restless legs syndrome    Right sided weakness 01/10/2020   Upper airway cough syndrome 09/06/2018   Onset Nov 2019 with uri  - Sinus CT 56/12/2018 neg    Visual disturbance 11/02/2021   Vitamin D deficiency    Von Willebrand disease     Past Medical History, Surgical history, Social history, and Family history were reviewed and updated as appropriate.   Please see review of systems for further details on the patient's review from today.   Objective:   Physical Exam:  There were no vitals taken for this visit.  Physical Exam Constitutional:      General: She is not in acute distress. Musculoskeletal:        General: No  deformity.  Neurological:     Mental Status: She is alert and oriented to person, place, and time.     Coordination: Coordination normal.  Psychiatric:        Attention and Perception: Attention and perception normal. She does not perceive auditory or visual hallucinations.        Mood and Affect: Mood is not anxious or depressed. Affect is not labile, blunt, angry or inappropriate.        Speech: Speech normal.  Behavior: Behavior normal.        Thought Content: Thought content normal. Thought content is not paranoid or delusional. Thought content does not include homicidal or suicidal ideation. Thought content does not include homicidal or suicidal plan.        Cognition and Memory: Cognition normal.        Judgment: Judgment normal.     Comments: Insight intact Improved mood and anxiety Improved short-term memory. Some mild impairment with recalling a few details from recent events.     Lab Review:     Component Value Date/Time   NA 140 01/25/2021 1504   K 4.0 01/25/2021 1504   CL 106 01/25/2021 1504   CO2 25 01/25/2021 1504   GLUCOSE 88 01/25/2021 1504   BUN 17 01/25/2021 1504   CREATININE 0.88 01/25/2021 1504   CREATININE 0.97 (H) 01/10/2020 1358   CALCIUM 10.0 01/25/2021 1504   PROT 6.9 01/25/2021 1504   ALBUMIN 4.6 01/25/2021 1504   AST 23 01/25/2021 1504   ALT 15 01/25/2021 1504   ALKPHOS 67 01/25/2021 1504   BILITOT 0.6 01/25/2021 1504       Component Value Date/Time   WBC 4.0 01/25/2021 1504   RBC 4.08 01/25/2021 1504   HGB 12.9 01/25/2021 1504   HCT 38.1 01/25/2021 1504   PLT 272.0 01/25/2021 1504   MCV 93.4 01/25/2021 1504   MCH 31.3 01/10/2020 1358   MCHC 33.9 01/25/2021 1504   RDW 13.1 01/25/2021 1504   LYMPHSABS 1,197 01/10/2020 1358   MONOABS 0.5 10/31/2019 1509   EOSABS 198 01/10/2020 1358   BASOSABS 32 01/10/2020 1358    No results found for: "POCLITH", "LITHIUM"   No results found for: "PHENYTOIN", "PHENOBARB", "VALPROATE", "CBMZ"    .res Assessment: Plan:    Discussed recent behavior changes and new resolve to resume activities and things she enjoyed prior to memory changes. Encouraged pt to continue to engage in activities that bring her joy and that this will likely continue to improve her mood.  Will continue Lexapro 10 mg po qd for depression and anxiety.  Continue Diazepam for insomnia.  Pt to follow-up in 2 months or sooner if clinically indicated.  Patient advised to contact office with any questions, adverse effects, or acute worsening in signs and symptoms.  I spent 30 minutes dedicated to the care of this patient on the date of this  encounter to include pre-visit review of records, face-to-face time with the patient discussing evaluation with neurologist, ordering of medication, and post visit documentation.   Faisa was seen today for follow-up.  Diagnoses and all orders for this visit:  PTSD (post-traumatic stress disorder) -     escitalopram (LEXAPRO) 10 MG tablet; Take 1 tablet (10 mg total) by mouth daily.  Depression, unspecified depression type -     escitalopram (LEXAPRO) 10 MG tablet; Take 1 tablet (10 mg total) by mouth daily.  Insomnia, unspecified type     Please see After Visit Summary for patient specific instructions.  Future Appointments  Date Time Provider Department Center  10/07/2022 12:45 PM Corie Chiquito, PMHNP [facility] None  02/01/2023  2:30 PM Glean Salvo, NP [facility] None    No orders of the defined types were placed in this encounter.   -------------------------------

## 2022-09-24 ENCOUNTER — Other Ambulatory Visit: Payer: Self-pay | Admitting: Family Medicine

## 2022-09-26 NOTE — Telephone Encounter (Signed)
Refill not appropriate. Pt is not longer under Dr. Zollie Pee care. She has been referred to neurology for further treatment. CMA Amber Bender marked rx as "no longer taking" due to side-effects, also making the refill request inappropriate.

## 2022-10-02 NOTE — Assessment & Plan Note (Signed)
No associated symptoms and intermittent

## 2022-10-02 NOTE — Progress Notes (Unsigned)
Subjective:    Patient ID: Dawn Simmons, female    DOB: Oct 18, 1945, 77 y.o.   MRN: [id number]  No chief complaint on file.   HPI Discussed the use of AI scribe software for clinical note transcription with the patient, who gave verbal consent to proceed.  History of Present Illness  Patient is a 77 year old female in today accompanied by her husband for follow up on chronic medical concerns. No recent febrile illness or hospitalizations. Denies CP/palp/SOB/HA/congestion/fevers/GI or GU c/o. 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   Benzodiazepine withdrawal without complication    Cervical radiculopathy at C6    Cheilitis    Chronic fatigue    Chronic obstructive pulmonary disease 09/07/2018   Quit smoking 1974 with copd changes on cxr but no limiting sob as of 09/06/2018  - 09/06/2018   Walked RA  2 laps @  approx 265ft each @ fast pace  stopped due to  End of study, no sob and sats  100% at end   Fibromyalgia 06/01/2015   History of chicken pox 09/15/2019   History of COVID-19 02/05/2020   History of measles    History of mumps    History of rubella    History of syncope    Insomnia 06/01/2015   Iron deficiency anemia    Left shoulder pain 10/31/2019   Leukopenia 09/15/2019   Low back pain 09/08/2021   Mitral valve prolapse    Mixed hyperlipidemia 11/04/2021   Neuritis    Palpitations    Pharyngitis    Psychogenic nonepileptic seizure    PTSD (post-traumatic stress disorder) 09/15/2019   Restless legs syndrome    Right sided weakness 01/10/2020   Upper airway cough syndrome 09/06/2018   Onset Nov 2019 with uri  - Sinus CT 56/12/2018 neg    Visual disturbance 11/02/2021   Vitamin D deficiency    Von Willebrand disease     Past Surgical History:  Procedure Laterality Date   NO PAST SURGERIES      Family History  Problem Relation Age  of Onset   Stroke Mother    Dementia Mother        with advanced age   Emphysema Father        smoked   Alcohol abuse Father    Heart failure Maternal Grandmother    Heart attack Maternal Grandfather    Heart failure Paternal Grandmother    Heart attack Paternal Grandfather    OCD Son    Drug abuse Son    Heart attack Maternal Uncle    Seizures Neg Hx     Social History   Socioeconomic History   Marital status: Married    Spouse name: tony   Number of children: 4   Years of education: 12   Highest education level: High school graduate  Occupational History   Occupation: Retired  Tobacco Use   Smoking status: Former    Packs/day: 0.50    Years: 10.00    Additional pack years: 0.00    Total pack years: 5.00    Types: Cigarettes    Quit date: 04/27/1972    Years since quitting: 50.4   Smokeless tobacco: Never  Vaping Use   Vaping Use: Never used  Substance and Sexual Activity   Alcohol use: Yes    Alcohol/week:  0.0 standard drinks of alcohol    Comment: occ 1 glass wine or beer   Drug use: No   Sexual activity: Not on file  Other Topics Concern   Not on file  Social History Narrative   Lives at home with husband   Caffeine use- tea, 1 cup/day   Social Determinants of Health   Financial Resource Strain: Low Risk  (06/15/2022)   Overall Financial Resource Strain (CARDIA)    Difficulty of Paying Living Expenses: Not hard at all  Food Insecurity: No Food Insecurity (06/15/2022)   Hunger Vital Sign    Worried About Running Out of Food in the Last Year: Never true    Ran Out of Food in the Last Year: Never true  Transportation Needs: No Transportation Needs (06/15/2022)   PRAPARE - Administrator, Civil Service (Medical): No    Lack of Transportation (Non-Medical): No  Physical Activity: Inactive (02/08/2021)   Exercise Vital Sign    Days of Exercise per Week: 0 days    Minutes of Exercise per Session: 0 min  Stress: No Stress Concern Present (06/15/2022)    [employer] of Occupational Health - Occupational Stress Questionnaire    Feeling of Stress : Not at all  Social Connections: Moderately Isolated (06/15/2022)   Social Connection and Isolation Panel [NHANES]    Frequency of Communication with Friends and Family: Once a week    Frequency of Social Gatherings with Friends and Family: Never    Attends Religious Services: More than 4 times per year    Active Member of [employer] or Organizations: No    Attends Banker Meetings: Never    Marital Status: Married  Catering manager Violence: Not At Risk (06/15/2022)   Humiliation, Afraid, Rape, and Kick questionnaire    Fear of Current or Ex-Partner: No    Emotionally Abused: No    Physically Abused: No    Sexually Abused: No    Outpatient Medications Prior to Visit  Medication Sig Dispense Refill   Ascorbic Acid (VITAMIN C) 100 MG tablet Take 100 mg by mouth daily.     benzonatate (TESSALON PERLES) 100 MG capsule Take 1 capsule (100 mg total) by mouth 3 (three) times daily as needed for cough. (Patient not taking: Reported on 09/01/2022) 40 capsule 1   Calcium-Magnesium 200-100 MG TABS Take 4 tablets by mouth daily. 2 tablets in the morning and 2 tablets in the evening     diazepam (VALIUM) 5 MG tablet Take 1 tablet (5 mg total) by mouth at bedtime. Take 1/2-1 tablet po QHS 90 tablet 0   ergocalciferol (DRISDOL) 200 MCG/ML drops      escitalopram (LEXAPRO) 10 MG tablet Take 1 tablet (10 mg total) by mouth daily. 90 tablet 0   MAGNESIUM PO Take 200 mg by mouth at bedtime.     Multiple Vitamin (MULTIVITAMIN) capsule Take 1 capsule by mouth daily.     Omega-3 1000 MG CAPS Take by mouth.     TURMERIC PO Take by mouth.     No facility-administered medications prior to visit.    Allergies  Allergen Reactions   Aricept [Donepezil Hcl] Other (See Comments)    Nightmares, worsened mental health   Doxycycline Swelling   Tetanus Toxoids Other (See Comments)    PAIN IN NECK AND FACE    Flexeril [Cyclobenzaprine] Other (See Comments)    Review of Systems  Constitutional:  Negative for fever and malaise/fatigue.  HENT:  Negative for congestion.  Eyes:  Negative for blurred vision.  Respiratory:  Negative for shortness of breath.   Cardiovascular:  Negative for chest pain, palpitations and leg swelling.  Gastrointestinal:  Negative for abdominal pain, blood in stool and nausea.  Genitourinary:  Negative for dysuria and frequency.  Musculoskeletal:  Negative for falls.  Skin:  Negative for rash.  Neurological:  Negative for dizziness, loss of consciousness and headaches.  Endo/Heme/Allergies:  Negative for environmental allergies.  Psychiatric/Behavioral:  Positive for memory loss. Negative for depression. The patient is nervous/anxious.       Objective:    Physical Exam Constitutional:      General: She is not in acute distress.    Appearance: Normal appearance. She is not ill-appearing or toxic-appearing.  HENT:     Head: Normocephalic and atraumatic.     Right Ear: External ear normal.     Left Ear: External ear normal.     Nose: Nose normal.  Eyes:     General:        Right eye: No discharge.        Left eye: No discharge.  Pulmonary:     Effort: Pulmonary effort is normal.  Skin:    Findings: No rash.  Neurological:     Mental Status: She is alert and oriented to person, place, and time.  Psychiatric:        Behavior: Behavior normal.   There were no vitals taken for this visit. Wt Readings from Last 3 Encounters:  07/21/22 128 lb 12.8 oz (58.4 kg)  07/12/22 127 lb 8 oz (57.8 kg)  05/30/22 130 lb (59 kg)    Diabetic Foot Exam - Simple   No data filed    Lab Results  Component Value Date   WBC 4.0 01/25/2021   HGB 12.9 01/25/2021   HCT 38.1 01/25/2021   PLT 272.0 01/25/2021   GLUCOSE 88 01/25/2021   CHOL 227 (H) 11/03/2021   TRIG 189.0 (H) 11/03/2021   HDL 62.50 11/03/2021   LDLCALC 127 (H) 11/03/2021   ALT 15 01/25/2021   AST 23  01/25/2021   NA 140 01/25/2021   K 4.0 01/25/2021   CL 106 01/25/2021   CREATININE 0.88 01/25/2021   BUN 17 01/25/2021   CO2 25 01/25/2021   TSH 2.450 01/06/2022    Lab Results  Component Value Date   TSH 2.450 01/06/2022   Lab Results  Component Value Date   WBC 4.0 01/25/2021   HGB 12.9 01/25/2021   HCT 38.1 01/25/2021   MCV 93.4 01/25/2021   PLT 272.0 01/25/2021   Lab Results  Component Value Date   NA 140 01/25/2021   K 4.0 01/25/2021   CO2 25 01/25/2021   GLUCOSE 88 01/25/2021   BUN 17 01/25/2021   CREATININE 0.88 01/25/2021   BILITOT 0.6 01/25/2021   ALKPHOS 67 01/25/2021   AST 23 01/25/2021   ALT 15 01/25/2021   PROT 6.9 01/25/2021   ALBUMIN 4.6 01/25/2021   CALCIUM 10.0 01/25/2021   GFR 64.86 01/25/2021   Lab Results  Component Value Date   CHOL 227 (H) 11/03/2021   Lab Results  Component Value Date   HDL 62.50 11/03/2021   Lab Results  Component Value Date   LDLCALC 127 (H) 11/03/2021   Lab Results  Component Value Date   TRIG 189.0 (H) 11/03/2021   Lab Results  Component Value Date   CHOLHDL 4 11/03/2021   No results found for: "HGBA1C"     Assessment &  Plan:  Palpitations Assessment & Plan: No associated symptoms and intermittent   Mixed hyperlipidemia Assessment & Plan: Encourage heart healthy diet such as MIND or DASH diet, increase exercise, avoid trans fats, simple carbohydrates and processed foods, consider a krill or fish or flaxseed oil cap daily.     Vitamin D deficiency Assessment & Plan: Supplement and monitor    Alzheimer's disease [facility]) Assessment & Plan: Likely diagnosis based on Neuropsych testing earlier this year.      Assessment and Plan              Danise Edge, MD

## 2022-10-02 NOTE — Assessment & Plan Note (Signed)
Encourage heart healthy diet such as MIND or DASH diet, increase exercise, avoid trans fats, simple carbohydrates and processed foods, consider a krill or fish or flaxseed oil cap daily.  °

## 2022-10-02 NOTE — Assessment & Plan Note (Signed)
Likely diagnosis based on Neuropsych testing earlier this year.

## 2022-10-02 NOTE — Assessment & Plan Note (Signed)
Supplement and monitor 

## 2022-10-03 ENCOUNTER — Ambulatory Visit (INDEPENDENT_AMBULATORY_CARE_PROVIDER_SITE_OTHER): Payer: Medicare Other | Admitting: Family Medicine

## 2022-10-03 VITALS — BP 122/72 | HR 70 | Temp 97.5°F | Resp 16 | Ht 65.0 in | Wt 126.0 lb

## 2022-10-03 DIAGNOSIS — G309 Alzheimer's disease, unspecified: Secondary | ICD-10-CM

## 2022-10-03 DIAGNOSIS — E559 Vitamin D deficiency, unspecified: Secondary | ICD-10-CM

## 2022-10-03 DIAGNOSIS — G3184 Mild cognitive impairment, so stated: Secondary | ICD-10-CM

## 2022-10-03 DIAGNOSIS — F0280 Dementia in other diseases classified elsewhere without behavioral disturbance: Secondary | ICD-10-CM

## 2022-10-03 DIAGNOSIS — E782 Mixed hyperlipidemia: Secondary | ICD-10-CM

## 2022-10-03 DIAGNOSIS — R002 Palpitations: Secondary | ICD-10-CM

## 2022-10-03 DIAGNOSIS — G4700 Insomnia, unspecified: Secondary | ICD-10-CM

## 2022-10-03 NOTE — Assessment & Plan Note (Signed)
Encouraged good sleep hygiene such as dark, quiet room. No blue/green glowing lights such as computer screens in bedroom. No alcohol or stimulants in evening. Cut down on caffeine as able. Regular exercise is helpful but not just prior to bed time.  Was sleeping too much Diazepam 5 mg so she dropped to a quarter tab and that has helped her sleep but not too much

## 2022-10-03 NOTE — Assessment & Plan Note (Signed)
She has seen Dr Curt Bears, MD of neurology at [facility] and he is noting he still believes this could be MCI and not true Alzheimer's he will see her again in 6 months.

## 2022-10-03 NOTE — Patient Instructions (Signed)

## 2022-10-04 ENCOUNTER — Telehealth: Payer: Self-pay

## 2022-10-04 ENCOUNTER — Other Ambulatory Visit: Payer: Self-pay

## 2022-10-04 DIAGNOSIS — E559 Vitamin D deficiency, unspecified: Secondary | ICD-10-CM

## 2022-10-04 DIAGNOSIS — E782 Mixed hyperlipidemia: Secondary | ICD-10-CM

## 2022-10-04 DIAGNOSIS — R002 Palpitations: Secondary | ICD-10-CM

## 2022-10-04 LAB — COMPREHENSIVE METABOLIC PANEL
ALT: 14 U/L (ref 0–35)
AST: 24 U/L (ref 0–37)
Albumin: 4.3 g/dL (ref 3.5–5.2)
Alkaline Phosphatase: 61 U/L (ref 39–117)
BUN: 17 mg/dL (ref 6–23)
CO2: 24 mEq/L (ref 19–32)
Calcium: 10.1 mg/dL (ref 8.4–10.5)
Chloride: 106 mEq/L (ref 96–112)
Creatinine, Ser: 0.97 mg/dL (ref 0.40–1.20)
GFR: 57.03 mL/min — ABNORMAL LOW (ref >60.00)
Glucose, Bld: 84 mg/dL (ref 70–99)
Potassium: 3.8 mEq/L (ref 3.5–5.1)
Sodium: 139 mEq/L (ref 135–145)
Total Bilirubin: 0.7 mg/dL (ref 0.2–1.2)
Total Protein: 6.8 g/dL (ref 6.0–8.3)

## 2022-10-04 LAB — CBC WITH DIFFERENTIAL/PLATELET
Basophils Absolute: 0 10*3/uL (ref 0.0–0.1)
Basophils Relative: 0.5 % (ref 0.0–3.0)
Eosinophils Absolute: 0 10*3/uL (ref 0.0–0.7)
Eosinophils Relative: 1.1 % (ref 0.0–5.0)
HCT: 37.7 % (ref 36.0–46.0)
Hemoglobin: 12.6 g/dL (ref 12.0–15.0)
Lymphocytes Relative: 33.6 % (ref 12.0–46.0)
Lymphs Abs: 1.6 10*3/uL (ref 0.7–4.0)
MCHC: 33.5 g/dL (ref 30.0–36.0)
MCV: 93.9 fl (ref 78.0–100.0)
Monocytes Absolute: 0.5 10*3/uL (ref 0.1–1.0)
Monocytes Relative: 10.9 % (ref 3.0–12.0)
Neutro Abs: 2.5 10*3/uL (ref 1.4–7.7)
Neutrophils Relative %: 53.9 % (ref 43.0–77.0)
Platelets: 259 10*3/uL (ref 150.0–400.0)
RBC: 4.01 Mil/uL (ref 3.87–5.11)
RDW: 13.6 % (ref 11.5–15.5)
WBC: 4.7 10*3/uL (ref 4.0–10.5)

## 2022-10-04 LAB — LIPID PANEL
Cholesterol: 203 mg/dL — ABNORMAL HIGH (ref 0–200)
HDL: 64.1 mg/dL (ref >39.00)
LDL Cholesterol: 119 mg/dL — ABNORMAL HIGH (ref 0–99)
NonHDL: 138.95
Total CHOL/HDL Ratio: 3
Triglycerides: 101 mg/dL (ref 0.0–149.0)
VLDL: 20.2 mg/dL (ref 0.0–40.0)

## 2022-10-04 LAB — VITAMIN D 25 HYDROXY (VIT D DEFICIENCY, FRACTURES): VITD: 115.06 ng/mL (ref 30.00–100.00)

## 2022-10-04 LAB — TSH: TSH: 2.03 u[IU]/mL (ref 0.35–5.50)

## 2022-10-04 NOTE — Telephone Encounter (Signed)
Called pt back we spoke and was advised.

## 2022-10-04 NOTE — Telephone Encounter (Signed)
CRITICAL VALUE STICKER  CRITICAL VALUE: Vit D 115.06  RECEIVER (on-site recipient of call): Melton Alar, [place]   DATE & TIME NOTIFIED:  11:18 AM , 10/04/22  MESSENGER (representative from lab): [facility] lab  MD NOTIFIED:  TIME OF NOTIFICATION: 11:18 AM , 10/04/22  RESPONSE:  pending

## 2022-10-04 NOTE — Telephone Encounter (Signed)
Pt said she missed a phone call. Please call patient back to advise what call might be in reference to. No note left so unsure who might have called the patient.

## 2022-10-06 ENCOUNTER — Ambulatory Visit: Payer: Self-pay | Admitting: Family Medicine

## 2022-10-07 ENCOUNTER — Encounter: Payer: Self-pay | Admitting: Psychiatry

## 2022-10-07 ENCOUNTER — Ambulatory Visit (INDEPENDENT_AMBULATORY_CARE_PROVIDER_SITE_OTHER): Payer: Medicare Other | Admitting: Psychiatry

## 2022-10-07 DIAGNOSIS — G4700 Insomnia, unspecified: Secondary | ICD-10-CM

## 2022-10-07 DIAGNOSIS — F32A Depression, unspecified: Secondary | ICD-10-CM

## 2022-10-07 DIAGNOSIS — F4310 Post-traumatic stress disorder, unspecified: Secondary | ICD-10-CM

## 2022-10-07 MED ORDER — ESCITALOPRAM OXALATE 10 MG PO TABS: 10.0000 mg | ORAL_TABLET | Freq: Every day | ORAL | 1 refills | Status: DC

## 2022-10-07 MED ORDER — DIAZEPAM 5 MG PO TABS: ORAL_TABLET | ORAL | 0 refills | Status: DC

## 2022-10-07 NOTE — Progress Notes (Signed)
Dawn Simmons [id number] 1945/12/28 77 y.o.  Subjective:   Patient ID:  Dawn Simmons is a 77 y.o. (DOB 1945-10-25) female.  Chief Complaint:  Chief Complaint  Patient presents with   Follow-up    Anxiety, depression, and insomnia    HPI Dawn Simmons presents to the office today for follow-up of anxiety and sleep disturbance. She reports that she recently renewed her drivers license recently after passing a road test and a computer test. She reports that she has not driven without her husband and plans to have husband ride with her. "That's exciting to me." She reports that she continues to have "more hope" since seeing Dr. Antonietta Barcelona. She reports, "I'm on the mend." Denies depressed mood. Reports that she enjoyed shopping a sale for items for their home. She reports that she had some slight worry about grandson going through some transitions and this subsided. She reports that she always takes the stairs due to fear of getting stuck in an elevator after being trapped in a crowded elevator in the past. "Everything is getting better." She reports that her energy and motivation are ok. Energy is lower with extreme heat and not having central A/C. No change in appetite. Forward thinking.   She reports occasionally forgetting her medication. She is taking Diazepam 1/2 tab every other night alternating with 1/4 tab every other night.   Past Psychiatric Medication Trials: (She reports that she is very sensitive to medication) Diazepam- Prescribed as need with limited improvement.  Prozac- reports that she has to take Prozac 20 mg in divided doses.  Lexapro Aricept- Nightmares, worsening anxiety  GAD-7    Flowsheet Row Office Visit from 01/27/2022 in [facility]  Total GAD-7 Score 0      PHQ2-9    Flowsheet Row Office Visit from 10/03/2022 in [facility] at [facility] Clinical Support from 06/15/2022 in [facility] at [facility] Office Visit from 03/31/2022 in [facility] at [facility] Office Visit from 02/14/2022 in [facility] Care at [facility] Office Visit from 01/27/2022 in [facility] Primary Care at [facility] Total Score 0 0 0 0 2  PHQ-9 Total Score -- -- -- -- 4        Review of Systems:  Review of Systems  Cardiovascular:  Negative for palpitations.  Musculoskeletal:  Negative for gait problem.  Psychiatric/Behavioral:         Please refer to HPI    Medications: I have reviewed the patient's current medications.  Current Outpatient Medications  Medication Sig Dispense Refill   Ascorbic Acid (VITAMIN C) 100 MG tablet Take 100 mg by mouth daily.     benzonatate (TESSALON PERLES) 100 MG capsule Take 1 capsule (100 mg total) by mouth 3 (three) times daily as needed for cough. (Patient not taking: Reported on 10/07/2022) 40 capsule 1   Calcium-Magnesium 200-100 MG TABS Take 4 tablets by mouth daily. 2 tablets in the morning and 2 tablets in the evening     diazepam (VALIUM) 5 MG tablet Take 1/4-1/2 tablet po QHS 45 tablet 0   ergocalciferol (DRISDOL) 200 MCG/ML drops      escitalopram (LEXAPRO) 10 MG tablet Take 1 tablet (10 mg total) by mouth daily. 90 tablet 1   MAGNESIUM PO Take 200 mg by mouth at bedtime.     Multiple Vitamin (MULTIVITAMIN) capsule  Take 1 capsule by mouth daily.     Omega-3 1000 MG CAPS Take by mouth.     TURMERIC PO Take by mouth.     No current facility-administered medications for this visit.    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   Benzodiazepine withdrawal without complication    Cervical radiculopathy at C6    Cheilitis    Chronic fatigue    Chronic obstructive pulmonary disease 09/07/2018   Quit smoking 1974 with copd changes on cxr but no limiting sob as of 09/06/2018  - 09/06/2018   Walked RA  2 laps @  approx 2109ft each @ fast pace  stopped due to  End of study, no sob and sats  100% at end   Fibromyalgia 06/01/2015   History of chicken pox 09/15/2019   History of COVID-19 02/05/2020   History of measles    History of mumps    History of rubella    History of syncope    Insomnia 06/01/2015   Iron deficiency anemia    Left shoulder pain 10/31/2019   Leukopenia 09/15/2019   Low back pain 09/08/2021   Mitral valve prolapse    Mixed hyperlipidemia 11/04/2021   Neuritis    Palpitations    Pharyngitis    Psychogenic nonepileptic seizure    PTSD (post-traumatic stress disorder) 09/15/2019   Restless legs syndrome    Right sided weakness 01/10/2020   Upper airway cough syndrome 09/06/2018   Onset Nov 2019 with uri  - Sinus CT 56/12/2018 neg    Visual disturbance 11/02/2021   Vitamin D deficiency    Von Willebrand disease     Past Medical History, Surgical history, Social history, and Family history were reviewed and updated as appropriate.   Please see review of systems for further details on the patient's review from today.   Objective:   Physical Exam:  There were no vitals taken for this visit.  Physical Exam Constitutional:      General: She is not in acute distress. Musculoskeletal:        General: No deformity.  Neurological:     Mental Status: She is alert and oriented to person, place, and time.     Coordination: Coordination normal.  Psychiatric:        Attention and Perception: Attention and perception normal. She does not perceive auditory or visual hallucinations.        Mood and Affect: Mood normal.  Mood is not anxious or depressed. Affect is not labile, blunt, angry or inappropriate.        Speech: Speech normal.        Behavior: Behavior normal. Behavior is cooperative.        Thought Content: Thought content normal. Thought content is not paranoid or delusional. Thought content does not include homicidal or suicidal ideation. Thought content does not include homicidal or suicidal plan.        Judgment: Judgment normal.     Comments: Insight intact Occasional difficulty with word finding and recalling names.      Lab Review:  Component Value Date/Time   NA 139 10/03/2022 1612   K 3.8 10/03/2022 1612   CL 106 10/03/2022 1612   CO2 24 10/03/2022 1612   GLUCOSE 84 10/03/2022 1612   BUN 17 10/03/2022 1612   CREATININE 0.97 10/03/2022 1612   CREATININE 0.97 (H) 01/10/2020 1358   CALCIUM 10.1 10/03/2022 1612   PROT 6.8 10/03/2022 1612   ALBUMIN 4.3 10/03/2022 1612   AST 24 10/03/2022 1612   ALT 14 10/03/2022 1612   ALKPHOS 61 10/03/2022 1612   BILITOT 0.7 10/03/2022 1612       Component Value Date/Time   WBC 4.7 10/03/2022 1612   RBC 4.01 10/03/2022 1612   HGB 12.6 10/03/2022 1612   HCT 37.7 10/03/2022 1612   PLT 259.0 10/03/2022 1612   MCV 93.9 10/03/2022 1612   MCH 31.3 01/10/2020 1358   MCHC 33.5 10/03/2022 1612   RDW 13.6 10/03/2022 1612   LYMPHSABS 1.6 10/03/2022 1612   MONOABS 0.5 10/03/2022 1612   EOSABS 0.0 10/03/2022 1612   BASOSABS 0.0 10/03/2022 1612    No results found for: "POCLITH", "LITHIUM"   No results found for: "PHENYTOIN", "PHENOBARB", "VALPROATE", "CBMZ"   .res Assessment: Plan:    I spent 30 minutes dedicated to the care of this patient on the date of this  encounter to include pre-visit review of records, face-to-face time with the patient discussing decrease in Diazepam, ordering of medication, and post visit documentation. Discussed that pt can continue lower dose of Diazepam 5 mg 1/4-1/2 tab po at bedtime for anxiety and insomnia  since she reports that she has been taking less Diazepam and continues to sleep well. She reports that she notices excessive somnolence with higher doses.  Continue Lexapro 10 mg po every day for anxiety and depression.  Pt to follow-up in 2 months or sooner if clinically indicated.  Patient advised to contact office with any questions, adverse effects, or acute worsening in signs and symptoms.   Khamari was seen today for follow-up.  Diagnoses and all orders for this visit:  PTSD (post-traumatic stress disorder) -     diazepam (VALIUM) 5 MG tablet; Take 1/4-1/2 tablet po QHS -     escitalopram (LEXAPRO) 10 MG tablet; Take 1 tablet (10 mg total) by mouth daily.  Insomnia, unspecified type -     diazepam (VALIUM) 5 MG tablet; Take 1/4-1/2 tablet po QHS  Depression, unspecified depression type -     escitalopram (LEXAPRO) 10 MG tablet; Take 1 tablet (10 mg total) by mouth daily.     Please see After Visit Summary for patient specific instructions.  Future Appointments  Date Time Provider Department Center  11/28/2022  1:15 PM [facility]  01/19/2023  2:20 PM Bradd Canary, MD [facility]  02/01/2023  2:30 PM Glean Salvo, NP [facility] None    No orders of the defined types were placed in this encounter.   -------------------------------

## 2022-10-08 ENCOUNTER — Emergency Department (HOSPITAL_COMMUNITY)
Admission: EM | Admit: 2022-10-08 | Discharge: 2022-10-09 | Discharge status: Against Medical Advice | Payer: Medicare Other | Attending: Emergency Medicine | Admitting: Emergency Medicine

## 2022-10-08 ENCOUNTER — Other Ambulatory Visit: Payer: Self-pay

## 2022-10-08 DIAGNOSIS — R220 Localized swelling, mass and lump, head: Secondary | ICD-10-CM | POA: Insufficient documentation

## 2022-10-08 DIAGNOSIS — Z5321 Procedure and treatment not carried out due to patient leaving prior to being seen by health care provider: Secondary | ICD-10-CM | POA: Diagnosis not present

## 2022-10-08 NOTE — ED Triage Notes (Signed)
Patient coming to ED for evaluation of swelling to L side of throat.  "Knot" present to L side.  States that "it will send a shooting pain" intermittently.  No reports of fevers.  No difficulty swallowing or sensation that throat is closing.  States she has not had any recent infections.

## 2022-10-09 ENCOUNTER — Encounter: Payer: Self-pay | Admitting: Family Medicine

## 2022-10-09 NOTE — ED Notes (Signed)
Patient informed registration staff member that she no longer wanted to wait.  RN unable to speak to patient prior to leaving.

## 2022-10-10 ENCOUNTER — Ambulatory Visit (INDEPENDENT_AMBULATORY_CARE_PROVIDER_SITE_OTHER): Payer: Medicare Other | Admitting: Family

## 2022-10-10 ENCOUNTER — Encounter: Payer: Self-pay | Admitting: Family

## 2022-10-10 ENCOUNTER — Ambulatory Visit (HOSPITAL_BASED_OUTPATIENT_CLINIC_OR_DEPARTMENT_OTHER)
Admission: RE | Admit: 2022-10-10 | Discharge: 2022-10-10 | Disposition: A | Discharge status: Home / Self Care | Payer: Medicare Other | Source: Ambulatory Visit | Attending: Family | Admitting: Family

## 2022-10-10 VITALS — BP 100/64 | HR 70 | Ht 65.0 in | Wt 124.8 lb

## 2022-10-10 DIAGNOSIS — M542 Cervicalgia: Secondary | ICD-10-CM | POA: Diagnosis not present

## 2022-10-10 DIAGNOSIS — K118 Other diseases of salivary glands: Secondary | ICD-10-CM | POA: Diagnosis not present

## 2022-10-10 MED ORDER — MELOXICAM 7.5 MG PO TABS: 7.5000 mg | ORAL_TABLET | Freq: Every day | ORAL | 0 refills | Status: DC | PRN

## 2022-10-10 NOTE — Telephone Encounter (Signed)
Appt scheduled w/ Melissa.  

## 2022-10-10 NOTE — Patient Instructions (Signed)
VISIT SUMMARY:  During your visit, we discussed the sharp, stabbing pain you've been experiencing on the left side of your neck. We noted a noticeable lump on the same side of your neck, which could be related to a gland or a lymph node. We don't believe you have a fever or any other systemic symptoms. We're considering several possible causes for your symptoms, including swollen lymph nodes, an enlarged gland, or nerve-related pain.  YOUR PLAN:  -NECK PAIN: We're going to order an ultrasound of your neck to get a better look at the lump. We're also prescribing a medication called Meloxicam to help manage your pain. Once the ultrasound is done, please send [place] a message through MyChart so we can review the results as quickly as possible.  INSTRUCTIONS:  Please make sure to schedule and complete your neck ultrasound as soon as possible. After the ultrasound, remember to send [place] a message through MyChart. Take the prescribed Meloxicam as directed to help manage your pain. If your symptoms worsen or you experience any side effects from the medication, please contact us immediately.

## 2022-10-10 NOTE — Assessment & Plan Note (Signed)
cute onset of sharp, shooting pain on the left side of the neck, worsening over time. Palpable prominence noted on the left side of the neck, possibly related to the parotid gland or a lymph node. No fever reported. Differential diagnosis includes lymphadenopathy, parotid gland enlargement, or nerve-related pain (possibly due to cervical radiculopathy). -Order neck ultrasound to further evaluate the palpable prominence. -Prescribe Meloxicam for pain relief. -Advise patient to send a MyChart message after the ultrasound is completed to expedite the reading of the report.

## 2022-10-10 NOTE — Progress Notes (Signed)
Subjective:     Patient ID: Dawn Simmons, female    DOB: Jul 28, 1945, 77 y.o.   MRN: [id number]  Chief Complaint  Patient presents with   Adenopathy    Onset 10/07/2022    HPI  Discussed the use of AI scribe software for clinical note transcription with the patient, who gave verbal consent to proceed.  History of Present Illness   The patient presents with a sharp, stabbing pain in the left side of her neck that started on Saturday. She is accompanied by her husband.  The pain is described as 'shooting' and 'on and off,' and is severe enough to cause the patient to 'jump.' The patient denies fever and other systemic symptoms. She sought care at an emergency room on Saturday but left after three hours due to the severity of the pain and long wait time. The patient denies any recent trauma or injury to the neck.          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   Benzodiazepine withdrawal without complication    Cervical radiculopathy at C6    Cheilitis    Chronic fatigue    Chronic obstructive pulmonary disease 09/07/2018   Quit smoking 1974 with copd changes on cxr but no limiting sob as of 09/06/2018  - 09/06/2018   Walked RA  2 laps @  approx 26ft each @ fast pace  stopped due to  End of study, no sob and sats  100% at end   Fibromyalgia 06/01/2015   History of chicken pox 09/15/2019   History of COVID-19 02/05/2020   History of measles    History of mumps    History of rubella    History of syncope    Insomnia 06/01/2015   Iron deficiency anemia    Left shoulder pain 10/31/2019   Leukopenia 09/15/2019   Low back pain 09/08/2021   Mitral valve prolapse    Mixed hyperlipidemia 11/04/2021   Neuritis    Palpitations    Pharyngitis    Psychogenic  nonepileptic seizure    PTSD (post-traumatic stress disorder) 09/15/2019   Restless legs syndrome    Right sided weakness 01/10/2020   Upper airway cough syndrome 09/06/2018   Onset Nov 2019 with uri  - Sinus CT 56/12/2018 neg    Visual disturbance 11/02/2021   Vitamin D deficiency    Von Willebrand disease     Past Surgical History:  Procedure Laterality Date   NO PAST SURGERIES      Family History  Problem Relation Age of Onset   Stroke Mother    Dementia Mother        with advanced age   Emphysema Father        smoked   Alcohol abuse Father    Heart failure Maternal Grandmother    Heart attack Maternal Grandfather    Heart failure Paternal Grandmother    Heart attack Paternal Grandfather    OCD Son    Drug abuse Son    Heart attack Maternal Uncle    Seizures Neg Hx     Social History   Socioeconomic History   Marital status: Married    Spouse name: Alinda Money   Number of children: 4   Years of  education: 12   Highest education level: High school graduate  Occupational History   Occupation: Retired  Tobacco Use   Smoking status: Former    Packs/day: 0.50    Years: 10.00    Additional pack years: 0.00    Total pack years: 5.00    Types: Cigarettes    Quit date: 04/27/1972    Years since quitting: 50.4   Smokeless tobacco: Never  Vaping Use   Vaping Use: Never used  Substance and Sexual Activity   Alcohol use: Yes    Alcohol/week: 0.0 standard drinks of alcohol    Comment: occ 1 glass wine or beer   Drug use: No   Sexual activity: Not on file  Other Topics Concern   Not on file  Social History Narrative   Lives at home with husband   Caffeine use- tea, 1 cup/day   Social Determinants of Health   Financial Resource Strain: Low Risk  (06/15/2022)   Overall Financial Resource Strain (CARDIA)    Difficulty of Paying Living Expenses: Not hard at all  Food Insecurity: No Food Insecurity (06/15/2022)   Hunger Vital Sign    Worried About Running Out of Food in  the Last Year: Never true    Ran Out of Food in the Last Year: Never true  Transportation Needs: No Transportation Needs (06/15/2022)   PRAPARE - Administrator, Civil Service (Medical): No    Lack of Transportation (Non-Medical): No  Physical Activity: Inactive (02/08/2021)   Exercise Vital Sign    Days of Exercise per Week: 0 days    Minutes of Exercise per Session: 0 min  Stress: No Stress Concern Present (06/15/2022)   [employer] of Occupational Health - Occupational Stress Questionnaire    Feeling of Stress : Not at all  Social Connections: Moderately Isolated (06/15/2022)   Social Connection and Isolation Panel [NHANES]    Frequency of Communication with Friends and Family: Once a week    Frequency of Social Gatherings with Friends and Family: Never    Attends Religious Services: More than 4 times per year    Active Member of [employer] or Organizations: No    Attends Banker Meetings: Never    Marital Status: Married  Catering manager Violence: Not At Risk (06/15/2022)   Humiliation, Afraid, Rape, and Kick questionnaire    Fear of Current or Ex-Partner: No    Emotionally Abused: No    Physically Abused: No    Sexually Abused: No    Outpatient Medications Prior to Visit  Medication Sig Dispense Refill   Ascorbic Acid (VITAMIN C) 100 MG tablet Take 100 mg by mouth daily.     Calcium-Magnesium 200-100 MG TABS Take 4 tablets by mouth daily. 2 tablets in the morning and 2 tablets in the evening     diazepam (VALIUM) 5 MG tablet Take 1/4-1/2 tablet po QHS 45 tablet 0   ergocalciferol (DRISDOL) 200 MCG/ML drops      escitalopram (LEXAPRO) 10 MG tablet Take 1 tablet (10 mg total) by mouth daily. 90 tablet 1   MAGNESIUM PO Take 200 mg by mouth at bedtime.     Multiple Vitamin (MULTIVITAMIN) capsule Take 1 capsule by mouth daily.     Omega-3 1000 MG CAPS Take by mouth.     TURMERIC PO Take by mouth.     benzonatate (TESSALON PERLES) 100 MG capsule Take 1 capsule  (100 mg total) by mouth 3 (three) times daily as needed for cough. (  Patient not taking: Reported on 10/07/2022) 40 capsule 1   No facility-administered medications prior to visit.    Allergies  Allergen Reactions   Aricept [Donepezil Hcl] Other (See Comments)    Nightmares, worsened mental health   Doxycycline Swelling   Tetanus Toxoids Other (See Comments)    PAIN IN NECK AND FACE   Flexeril [Cyclobenzaprine] Other (See Comments)    ROS     Objective:    Physical Exam Constitutional:      Appearance: Normal appearance.  Neck:     Thyroid: No thyroid mass.     Trachea: Trachea normal.     Comments: Patient has ovoid area of prominence left anterior neck, ?lymph node.  Cardiovascular:     Rate and Rhythm: Normal rate.  Pulmonary:     Effort: Pulmonary effort is normal.  Musculoskeletal:     Cervical back: Neck supple.  Neurological:     Mental Status: She is alert.      BP 100/64 (BP Location: Left Arm, Patient Position: Sitting, Cuff Size: Normal)   Pulse 70   Ht 5\' 5"  (1.651 m)   Wt 124 lb 12.8 oz (56.6 kg)   SpO2 99%   BMI 20.77 kg/m  Wt Readings from Last 3 Encounters:  10/10/22 124 lb 12.8 oz (56.6 kg)  10/03/22 126 lb (57.2 kg)  07/21/22 128 lb 12.8 oz (58.4 kg)       Assessment & Plan:   Problem List Items Addressed This Visit       Unprioritized   Neck pain - Primary    cute onset of sharp, shooting pain on the left side of the neck, worsening over time. Palpable prominence noted on the left side of the neck, possibly related to the parotid gland or a lymph node. No fever reported. Differential diagnosis includes lymphadenopathy, parotid gland enlargement, or nerve-related pain (possibly due to cervical radiculopathy). -Order neck ultrasound to further evaluate the palpable prominence. -Prescribe Meloxicam for pain relief. -Advise patient to send a MyChart message after the ultrasound is completed to expedite the reading of the report.       Relevant Orders   US Soft Tissue Head/Neck (NON-THYROID)    I am having Dawn Simmons start on meloxicam. I am also having her maintain her multivitamin, Calcium-Magnesium, vitamin C, ergocalciferol (VITAMIN D2), MAGNESIUM PO, Omega-3, TURMERIC PO, benzonatate, diazepam, and escitalopram.  Meds ordered this encounter  Medications   meloxicam (MOBIC) 7.5 MG tablet    Sig: Take 1 tablet (7.5 mg total) by mouth daily as needed for pain.    Dispense:  14 tablet    Refill:  0    Order Specific Question:   Supervising Provider    Answer:   Danise Edge A [[id number]]

## 2022-10-11 ENCOUNTER — Telehealth: Payer: Self-pay | Admitting: *Deleted

## 2022-10-11 NOTE — Telephone Encounter (Signed)
Pt called about imaging results.  Pt was advised that they have not been viewed yet.  Advised they usually see the results before we do.  Advised that she will either get a  mychart message or call when provider comments on them.

## 2022-10-12 ENCOUNTER — Telehealth: Payer: Self-pay | Admitting: Family

## 2022-10-12 DIAGNOSIS — K118 Other diseases of salivary glands: Secondary | ICD-10-CM

## 2022-10-12 NOTE — Telephone Encounter (Signed)
Reviewed [place] results with pt and her husband. She is having some dry mouth. Will schedule CT neck for further evaluation.

## 2022-10-18 ENCOUNTER — Ambulatory Visit (HOSPITAL_BASED_OUTPATIENT_CLINIC_OR_DEPARTMENT_OTHER)
Admission: RE | Admit: 2022-10-18 | Discharge: 2022-10-18 | Disposition: A | Discharge status: Home / Self Care | Payer: Medicare Other | Source: Ambulatory Visit | Attending: Family | Admitting: Family

## 2022-10-18 DIAGNOSIS — K118 Other diseases of salivary glands: Secondary | ICD-10-CM | POA: Insufficient documentation

## 2022-10-18 MED ORDER — IOHEXOL 300 MG/ML  SOLN
100.0000 mL | Freq: Once | INTRAMUSCULAR | Status: AC | PRN
  Administered 2022-10-18: 75 mL via INTRAVENOUS

## 2022-11-28 ENCOUNTER — Other Ambulatory Visit (INDEPENDENT_AMBULATORY_CARE_PROVIDER_SITE_OTHER): Payer: Medicare Other

## 2022-11-28 DIAGNOSIS — E559 Vitamin D deficiency, unspecified: Secondary | ICD-10-CM

## 2022-11-28 DIAGNOSIS — E782 Mixed hyperlipidemia: Secondary | ICD-10-CM

## 2022-11-28 DIAGNOSIS — R002 Palpitations: Secondary | ICD-10-CM

## 2022-11-28 LAB — CBC WITH DIFFERENTIAL/PLATELET
Basophils Absolute: 0 10*3/uL (ref 0.0–0.1)
Basophils Relative: 0.5 % (ref 0.0–3.0)
Eosinophils Absolute: 0 10*3/uL (ref 0.0–0.7)
Eosinophils Relative: 0.9 % (ref 0.0–5.0)
HCT: 39.7 % (ref 36.0–46.0)
Hemoglobin: 12.8 g/dL (ref 12.0–15.0)
Lymphocytes Relative: 31 % (ref 12.0–46.0)
Lymphs Abs: 1.3 10*3/uL (ref 0.7–4.0)
MCHC: 32.2 g/dL (ref 30.0–36.0)
MCV: 94.9 fl (ref 78.0–100.0)
Monocytes Absolute: 0.5 10*3/uL (ref 0.1–1.0)
Monocytes Relative: 12.6 % — ABNORMAL HIGH (ref 3.0–12.0)
Neutro Abs: 2.2 10*3/uL (ref 1.4–7.7)
Neutrophils Relative %: 55 % (ref 43.0–77.0)
Platelets: 254 10*3/uL (ref 150.0–400.0)
RBC: 4.18 Mil/uL (ref 3.87–5.11)
RDW: 13.7 % (ref 11.5–15.5)
WBC: 4.1 10*3/uL (ref 4.0–10.5)

## 2022-11-28 LAB — TSH: TSH: 1.95 u[IU]/mL (ref 0.35–5.50)

## 2022-11-28 LAB — VITAMIN D 25 HYDROXY (VIT D DEFICIENCY, FRACTURES): VITD: 76.08 ng/mL (ref 30.00–100.00)

## 2022-11-29 LAB — COMPREHENSIVE METABOLIC PANEL
ALT: 19 U/L (ref 0–35)
AST: 24 U/L (ref 0–37)
Albumin: 4.2 g/dL (ref 3.5–5.2)
Alkaline Phosphatase: 62 U/L (ref 39–117)
BUN: 16 mg/dL (ref 6–23)
CO2: 24 meq/L (ref 19–32)
Calcium: 9.3 mg/dL (ref 8.4–10.5)
Chloride: 106 meq/L (ref 96–112)
Creatinine, Ser: 0.96 mg/dL (ref 0.40–1.20)
GFR: 57.68 mL/min — ABNORMAL LOW (ref >60.00)
Glucose, Bld: 86 mg/dL (ref 70–99)
Potassium: 4.1 meq/L (ref 3.5–5.1)
Sodium: 140 meq/L (ref 135–145)
Total Bilirubin: 0.6 mg/dL (ref 0.2–1.2)
Total Protein: 6.5 g/dL (ref 6.0–8.3)

## 2022-11-29 LAB — LIPID PANEL
Cholesterol: 222 mg/dL — ABNORMAL HIGH (ref 0–200)
HDL: 66.3 mg/dL (ref >39.00)
LDL Cholesterol: 134 mg/dL — ABNORMAL HIGH (ref 0–99)
NonHDL: 155.55
Total CHOL/HDL Ratio: 3
Triglycerides: 107 mg/dL (ref 0.0–149.0)
VLDL: 21.4 mg/dL (ref 0.0–40.0)

## 2022-12-01 ENCOUNTER — Ambulatory Visit (INDEPENDENT_AMBULATORY_CARE_PROVIDER_SITE_OTHER): Payer: Medicare Other | Admitting: Psychiatry

## 2022-12-01 ENCOUNTER — Encounter: Payer: Self-pay | Admitting: Psychiatry

## 2022-12-01 VITALS — Wt 124.0 lb

## 2022-12-01 DIAGNOSIS — F32A Depression, unspecified: Secondary | ICD-10-CM

## 2022-12-01 DIAGNOSIS — F4310 Post-traumatic stress disorder, unspecified: Secondary | ICD-10-CM | POA: Diagnosis not present

## 2022-12-01 DIAGNOSIS — G4700 Insomnia, unspecified: Secondary | ICD-10-CM

## 2022-12-01 MED ORDER — DIAZEPAM 5 MG PO TABS
ORAL_TABLET | ORAL | 0 refills | Status: DC
Start: 2023-01-15 — End: 2023-04-20

## 2022-12-01 NOTE — Progress Notes (Signed)
Dawn Simmons [id number] Mar 01, 1946 77 y.o.  Subjective:   Patient ID:  Dawn Simmons is a 77 y.o. (DOB 19-Jul-1945) female.  Chief Complaint:  Chief Complaint  Patient presents with   Follow-up    Anxiety, depression    HPI Dawn Simmons presents to the office today for follow-up of anxiety and sleep disturbance.  Dawn Simmons denies depressed mood other than being disappointed that Dawn Simmons cannot spend much time outside because of mosquitos.Dawn Simmons reports that Dawn Simmons has been changing her hair style and has enjoyed making this change. Appetite is unchanged. Dawn Simmons reports that Dawn Simmons is usually taking Diazepam 5 mg 1/4-1/2 tablet at bedtime and reports that Dawn Simmons is sleeping ok.  Her husband's sister and brother-in-law came to visit recently and this was "difficult" for her due to not enjoying brother-in-law's company. Dawn Simmons reports that Dawn Simmons had some anxiety around him. Dawn Simmons reports some anxiety and notices that Dawn Simmons will wring her hands. "I'm kind of getting back to me again." Dawn Simmons reports energy is ok. Denies SI.   Reports husband occasionally has had some medical issues.   Past Psychiatric Medication Trials: (Dawn Simmons reports that Dawn Simmons is very sensitive to medication) Diazepam- Prescribed as need with limited improvement.  Prozac- reports that Dawn Simmons has to take Prozac 20 mg in divided doses.  Lexapro Aricept- Nightmares, worsening anxiety  GAD-7    Flowsheet Row Office Visit from 01/27/2022 in [facility] Primary Care at [facility]  Total GAD-7 Score 0      PHQ2-9    Flowsheet Row Office Visit from 10/03/2022 in [facility] Primary Care at [facility] Clinical Support from 06/15/2022 in [facility] Primary Care at [facility] Office Visit from 03/31/2022 in [facility] Primary Care at [facility] Office Visit from 02/14/2022 in [facility] Primary Care at [facility] Office Visit from 01/27/2022 in [facility] Primary Care at  [facility]  PHQ-2 Total Score 0 0 0 0 2  PHQ-9 Total Score -- -- -- -- 4      Flowsheet Row ED from 10/08/2022 in [facility] Emergency Department at [facility]  C-SSRS RISK CATEGORY No Risk        Review of Systems:  Review of Systems  Musculoskeletal:  Negative for gait problem.  Neurological:  Negative for tremors.  Psychiatric/Behavioral:         Please refer to HPI    Medications: I have reviewed the patient's current medications.  Current Outpatient Medications  Medication Sig Dispense Refill   ergocalciferol (DRISDOL) 200 MCG/ML drops      escitalopram (LEXAPRO) 10 MG tablet Take 1 tablet (10 mg total) by mouth daily. 90 tablet 1   Multiple Vitamin (MULTIVITAMIN) capsule Take 1 capsule by mouth daily.     Ascorbic Acid (VITAMIN C) 100 MG tablet Take 100 mg by mouth daily.     benzonatate (TESSALON PERLES) 100 MG capsule Take 1 capsule (100 mg total) by mouth 3 (three) times daily as needed for cough. (Patient not taking: Reported on 10/07/2022) 40 capsule 1   Calcium-Magnesium 200-100 MG TABS Take 4 tablets by mouth daily. 2 tablets in the morning and 2 tablets in the evening     [START ON 01/15/2023] diazepam (VALIUM) 5 MG tablet Take 1/4-1/2 tablet po QHS 45 tablet 0   MAGNESIUM PO Take 200 mg by mouth at bedtime.     meloxicam (MOBIC) 7.5 MG tablet Take 1 tablet (7.5 mg total)  by mouth daily as needed for pain. 14 tablet 0   Omega-3 1000 MG CAPS Take by mouth.     TURMERIC PO Take by mouth.     No current facility-administered medications for this visit.    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   Benzodiazepine withdrawal without complication    Cervical radiculopathy at C6    Cheilitis    Chronic fatigue    Chronic obstructive pulmonary disease 09/07/2018   Quit smoking 1974 with copd changes on cxr but no limiting sob as of 09/06/2018  - 09/06/2018   Walked RA  2 laps @  approx 273ft each @ fast pace  stopped due to  End of study, no sob and sats  100% at end   Fibromyalgia 06/01/2015   History of chicken pox 09/15/2019   History of COVID-19 02/05/2020   History of measles    History of mumps    History of rubella    History of syncope    Insomnia 06/01/2015   Iron deficiency anemia    Left shoulder pain 10/31/2019   Leukopenia 09/15/2019   Low back pain 09/08/2021   Mitral valve prolapse    Mixed hyperlipidemia 11/04/2021   Neuritis    Palpitations    Pharyngitis    Psychogenic nonepileptic seizure    PTSD (post-traumatic stress disorder) 09/15/2019   Restless legs syndrome    Right sided weakness 01/10/2020   Upper airway cough syndrome 09/06/2018   Onset Nov 2019 with uri  - Sinus CT 56/12/2018 neg    Visual disturbance 11/02/2021   Vitamin D deficiency    Von Willebrand disease     Past Medical History, Surgical history, Social history, and Family history were reviewed and updated as appropriate.   Please see review of systems for further details on the patient's review from today.   Objective:   Physical Exam:  Wt 124 lb (56.2 kg)   BMI 20.63 kg/m   Physical Exam Constitutional:      General: Dawn Simmons is not in acute distress. Musculoskeletal:        General: No deformity.  Neurological:     Mental Status: Dawn Simmons is alert and oriented to person, place, and time.     Coordination: Coordination normal.  Psychiatric:        Attention and Perception: Attention and perception normal. Dawn Simmons does not perceive auditory or visual hallucinations.        Mood and Affect: Affect is not labile, blunt, angry or  inappropriate.        Speech: Speech normal.        Behavior: Behavior normal. Behavior is cooperative.        Thought Content: Thought content normal. Thought content is not paranoid or delusional. Thought content does not include homicidal or suicidal ideation. Thought content does not include homicidal or suicidal plan.        Cognition and Memory: Cognition normal. Memory is impaired.        Judgment: Judgment normal.     Comments: Insight fair Mildly anxious Sad in response  to limitations and limited socialization     Lab Review:     Component Value Date/Time   NA 140 11/28/2022 1308   K 4.1 11/28/2022 1308   CL 106 11/28/2022 1308   CO2 24 11/28/2022 1308   GLUCOSE 86 11/28/2022 1308   BUN 16 11/28/2022 1308   CREATININE 0.96 11/28/2022 1308   CREATININE 0.97 (H) 01/10/2020 1358   CALCIUM 9.3 11/28/2022 1308   PROT 6.5 11/28/2022 1308   ALBUMIN 4.2 11/28/2022 1308   AST 24 11/28/2022 1308   ALT 19 11/28/2022 1308   ALKPHOS 62 11/28/2022 1308   BILITOT 0.6 11/28/2022 1308       Component Value Date/Time   WBC 4.1 11/28/2022 1308   RBC 4.18 11/28/2022 1308   HGB 12.8 11/28/2022 1308   HCT 39.7 11/28/2022 1308   PLT 254.0 11/28/2022 1308   MCV 94.9 11/28/2022 1308   MCH 31.3 01/10/2020 1358   MCHC 32.2 11/28/2022 1308   RDW 13.7 11/28/2022 1308   LYMPHSABS 1.3 11/28/2022 1308   MONOABS 0.5 11/28/2022 1308   EOSABS 0.0 11/28/2022 1308   BASOSABS 0.0 11/28/2022 1308    No results found for: "POCLITH", "LITHIUM"   No results found for: "PHENYTOIN", "PHENOBARB", "VALPROATE", "CBMZ"   .res Assessment: Plan:    38 minutes spent dedicated to the care of this patient on the date of this encounter to include pre-visit review of records, ordering of medication, post visit documentation, and face-to-face time with the patient discussing her questions about possibly reducing Lexapro. Recommended continuing current dose of Lexapro 10 mg daily since this dose has been  helpful for her mood and anxiety and Dawn Simmons is continuing to have some mild, residual anxiety and depressive s/s and may benefit from a slight dose increase. Pt declines dose increase and is in agreement with continuing Lexapro 10 mg daily for depression and anxiety. Continue Diazepam 5 mg 1/4-1/2 tablet at bedtime for insomnia and anxiety.  Pt to follow-up in 3 months or sooner if clinically indicated.  Patient advised to contact office with any questions, adverse effects, or acute worsening in signs and symptoms.  Kuulei was seen today for follow-up.  Diagnoses and all orders for this visit:  PTSD (post-traumatic stress disorder) -     diazepam (VALIUM) 5 MG tablet; Take 1/4-1/2 tablet po QHS  Insomnia, unspecified type -     diazepam (VALIUM) 5 MG tablet; Take 1/4-1/2 tablet po QHS  Depression, unspecified depression type     Please see After Visit Summary for patient specific instructions.  Future Appointments  Date Time Provider Department Center  01/19/2023  2:20 PM Bradd Canary, MD [facility]  02/01/2023  2:30 PM Glean Salvo, NP [facility] None  03/01/2023  1:45 PM Corie Chiquito, PMHNP [facility] None    No orders of the defined types were placed in this encounter.   -------------------------------

## 2023-01-04 ENCOUNTER — Other Ambulatory Visit: Payer: Self-pay

## 2023-01-04 DIAGNOSIS — Z1211 Encounter for screening for malignant neoplasm of colon: Secondary | ICD-10-CM

## 2023-01-08 DIAGNOSIS — Z1211 Encounter for screening for malignant neoplasm of colon: Secondary | ICD-10-CM | POA: Diagnosis not present

## 2023-01-09 NOTE — Assessment & Plan Note (Signed)
Supplement and monitor 

## 2023-01-09 NOTE — Assessment & Plan Note (Signed)
Likely diagnosis based on Neuropsych testing earlier this year.

## 2023-01-09 NOTE — Assessment & Plan Note (Signed)
No recent exacerbation 

## 2023-01-09 NOTE — Assessment & Plan Note (Signed)
Encourage heart healthy diet such as MIND or DASH diet, increase exercise, avoid trans fats, simple carbohydrates and processed foods, consider a krill or fish or flaxseed oil cap daily.  °

## 2023-01-10 ENCOUNTER — Ambulatory Visit (INDEPENDENT_AMBULATORY_CARE_PROVIDER_SITE_OTHER): Payer: Medicare Other | Admitting: Family Medicine

## 2023-01-10 ENCOUNTER — Encounter: Payer: Self-pay | Admitting: Family Medicine

## 2023-01-10 VITALS — BP 120/68 | HR 69 | Temp 97.6°F | Resp 16 | Ht 65.0 in | Wt 127.0 lb

## 2023-01-10 DIAGNOSIS — E559 Vitamin D deficiency, unspecified: Secondary | ICD-10-CM | POA: Diagnosis not present

## 2023-01-10 DIAGNOSIS — R413 Other amnesia: Secondary | ICD-10-CM | POA: Diagnosis not present

## 2023-01-10 DIAGNOSIS — Z23 Encounter for immunization: Secondary | ICD-10-CM | POA: Diagnosis not present

## 2023-01-10 DIAGNOSIS — G4700 Insomnia, unspecified: Secondary | ICD-10-CM

## 2023-01-10 DIAGNOSIS — F0280 Dementia in other diseases classified elsewhere without behavioral disturbance: Secondary | ICD-10-CM

## 2023-01-10 DIAGNOSIS — J439 Emphysema, unspecified: Secondary | ICD-10-CM | POA: Diagnosis not present

## 2023-01-10 DIAGNOSIS — E782 Mixed hyperlipidemia: Secondary | ICD-10-CM | POA: Diagnosis not present

## 2023-01-10 NOTE — Assessment & Plan Note (Signed)
Encouraged good sleep hygiene such as dark, quiet room. No blue/green glowing lights such as computer screens in bedroom. No alcohol or stimulants in evening. Cut down on caffeine as able. Regular exercise is helpful but not just prior to bed time.  Is sleeping well on Diazepam 2.5 mg at bedtime, she gets it from psychiatry
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
## 2023-01-10 NOTE — Patient Instructions (Signed)
RSV, Respiratory Synicitial Virus Vaccine, Arexvy at pharmacy  Insomnia Insomnia is a sleep disorder that makes it difficult to fall asleep or stay asleep. Insomnia can cause fatigue, low energy, difficulty concentrating, mood swings, and poor performance at work or school. There are three different ways to classify insomnia: Difficulty falling asleep. Difficulty staying asleep. Waking up too early in the morning. Any type of insomnia can be long-term (chronic) or short-term (acute). Both are common. Short-term insomnia usually lasts for 3 months or less. Chronic insomnia occurs at least three times a week for longer than 3 months. What are the causes? Insomnia may be caused by another condition, situation, or substance, such as: Having certain mental health conditions, such as anxiety and depression. Using caffeine, alcohol, tobacco, or drugs. Having gastrointestinal conditions, such as gastroesophageal reflux disease (GERD). Having certain medical conditions. These include: Asthma. Alzheimer's disease. Stroke. Chronic pain. An overactive thyroid gland (hyperthyroidism). Other sleep disorders, such as restless legs syndrome and sleep apnea. Menopause. Sometimes, the cause of insomnia may not be known. What increases the risk? Risk factors for insomnia include: Gender. Females are affected more often than males. Age. Insomnia is more common as people get older. Stress and certain medical and mental health conditions. Lack of exercise. Having an irregular work schedule. This may include working night shifts and traveling between different time zones. What are the signs or symptoms? If you have insomnia, the main symptom is having trouble falling asleep or having trouble staying asleep. This may lead to other symptoms, such as: Feeling tired or having low energy. Feeling nervous about going to sleep. Not feeling rested in the morning. Having trouble concentrating. Feeling irritable,  anxious, or depressed. How is this diagnosed? This condition may be diagnosed based on: Your symptoms and medical history. Your health care provider may ask about: Your sleep habits. Any medical conditions you have. Your mental health. A physical exam. How is this treated? Treatment for insomnia depends on the cause. Treatment may focus on treating an underlying condition that is causing the insomnia. Treatment may also include: Medicines to help you sleep. Counseling or therapy. Lifestyle adjustments to help you sleep better. Follow these instructions at home: Eating and drinking  Limit or avoid alcohol, caffeinated beverages, and products that contain nicotine and tobacco, especially close to bedtime. These can disrupt your sleep. Do not eat a large meal or eat spicy foods right before bedtime. This can lead to digestive discomfort that can make it hard for you to sleep. Sleep habits  Keep a sleep diary to help you and your health care provider figure out what could be causing your insomnia. Write down: When you sleep. When you wake up during the night. How well you sleep and how rested you feel the next day. Any side effects of medicines you are taking. What you eat and drink. Make your bedroom a dark, comfortable place where it is easy to fall asleep. Put up shades or blackout curtains to block light from outside. Use a white noise machine to block noise. Keep the temperature cool. Limit screen use before bedtime. This includes: Not watching TV. Not using your smartphone, tablet, or computer. Stick to a routine that includes going to bed and waking up at the same times every day and night. This can help you fall asleep faster. Consider making a quiet activity, such as reading, part of your nighttime routine. Try to avoid taking naps during the day so that you sleep better at night. Get  out of bed if you are still awake after 15 minutes of trying to sleep. Keep the lights down,  but try reading or doing a quiet activity. When you feel sleepy, go back to bed. General instructions Take over-the-counter and prescription medicines only as told by your health care provider. Exercise regularly as told by your health care provider. However, avoid exercising in the hours right before bedtime. Use relaxation techniques to manage stress. Ask your health care provider to suggest some techniques that may work well for you. These may include: Breathing exercises. Routines to release muscle tension. Visualizing peaceful scenes. Make sure that you drive carefully. Do not drive if you feel very sleepy. Keep all follow-up visits. This is important. Contact a health care provider if: You are tired throughout the day. You have trouble in your daily routine due to sleepiness. You continue to have sleep problems, or your sleep problems get worse. Get help right away if: You have thoughts about hurting yourself or someone else. Get help right away if you feel like you may hurt yourself or others, or have thoughts about taking your own life. Go to your nearest emergency room or: Call 911. Call the National Suicide Prevention Lifeline at 581 558 5590 or 988. This is open 24 hours a day. Text the Crisis Text Line at 608-191-7422. Summary Insomnia is a sleep disorder that makes it difficult to fall asleep or stay asleep. Insomnia can be long-term (chronic) or short-term (acute). Treatment for insomnia depends on the cause. Treatment may focus on treating an underlying condition that is causing the insomnia. Keep a sleep diary to help you and your health care provider figure out what could be causing your insomnia. This information is not intended to replace advice given to you by your health care provider. Make sure you discuss any questions you have with your health care provider. Document Revised: 03/08/2021 Document Reviewed: 03/08/2021 Elsevier Patient Education  2024 [employer].

## 2023-01-13 LAB — COLOGUARD: COLOGUARD: NEGATIVE

## 2023-01-19 ENCOUNTER — Ambulatory Visit: Payer: Medicare Other | Admitting: Family Medicine

## 2023-02-01 ENCOUNTER — Ambulatory Visit (INDEPENDENT_AMBULATORY_CARE_PROVIDER_SITE_OTHER): Payer: Medicare Other | Admitting: Neurology

## 2023-02-01 ENCOUNTER — Encounter: Payer: Self-pay | Admitting: Neurology

## 2023-02-01 VITALS — BP 108/70 | HR 68 | Resp 17 | Ht 65.0 in

## 2023-02-01 DIAGNOSIS — R419 Unspecified symptoms and signs involving cognitive functions and awareness: Secondary | ICD-10-CM | POA: Diagnosis not present

## 2023-02-01 DIAGNOSIS — F39 Unspecified mood [affective] disorder: Secondary | ICD-10-CM | POA: Insufficient documentation

## 2023-02-01 NOTE — Patient Instructions (Signed)
Continue close follow-up [facility].  Return back here if needed.

## 2023-02-01 NOTE — Progress Notes (Signed)
Patient: Dawn Simmons Date of Birth: 1945/10/14  Reason for Visit: Follow up History from: Patient, husband Primary Neurologist: [facility]  ASSESSMENT AND PLAN 77 y.o. 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 Saw Dr. Antonietta Barcelona neurologist at [facility], plan to follow back up in December. Was pleased with their visit. Will consolidate memory care and continue follow-up at [facility]. -MOCA 20/30 today (21/09 August 2022) -Plan for repeat neuropsych evaluation 12-18 months post Jan 2024 with Dr. Milbert Coulter -Untolerable side effect of Aricept with nightmares, has been major set back for her mental health  -She will continue follow-up at [facility], return here as needed. We had previously discussed Namenda, she will discuss at visit in December  HISTORY OF PRESENT ILLNESS: Today 02/01/23 MOCA 20/30 (was 21/30 in April). Husband thinks has remained stable. On the way here, didn't remember how to get her or if she had ever been here. Independent in ADLs, does the cleaning, laundry. Husband does cooking since pandemic. She doesn't drive. Mental health is stable, she does feel lonely, has been married 56 years. Can't go out in the yard due to mosquitos. Is social with friends, goes to church. Her 3 daughters are local. She manages her medications, takes Lexapro and Valium. Sleeps well, still dreams at night, not scary. She saw Dr. Antonietta Barcelona at [facility], encouraged to follow up in 6 months, next steps Namenda, repeat neuropsych testing. She felt she has been better since seeing Dr. Antonietta Barcelona, felt like he was "magic".  She does lose her train of thought.   07/12/22 SS: Had neuropsychological evaluation May 03, 2022 with Dr. Milbert Coulter, felt concerning for neurodegenerative illness such as Alzheimer's disease.  Felt undoubtably a  psychiatric component to her current presentation, but does not fully account for cognitive decline.  MRI imaging has showed medial temporal brain atrophy in typical Alzheimer's disease pattern.  Recommended repeating in 12 to 18 months.  Had adverse effects of nightmares with Aricept after taking 1 night.  Had significant anxiety from experience with Aricept.  Multiple visits with psychiatry as result. On Valium and Lexapro. Mentions has determined no history of AD in her family. Did EMDR treatment with therapist to help with traumatic experience for Aricept x 4. She improved. Very thankful for her current Lexapro and Valium. Her drivers license expired. They did get COVID in early March. No changes in memory, hasn't given up anything. Feels she is doing well. She is not very active, but does enjoy being outside. Pleased her sister told her that their mother didn't have AD, she had mini-strokes.   HISTORY  Update 02/09/2022 JM: Patient returns for sooner scheduled visit to review MRI results.  She is accompanied by her husband.  She was previously seen 1 month ago by Dr. Delena Bali for memory loss since 2017.  MRI brain w/wo contrast completed on 01/31/2022 showed mild generalized cortical atrophy more advanced in the medial temporal lobes significantly progressed compared to 2017 imaging as well as mild chronic microvascular ischemic changes typical for age, otherwise unremarkable.  Lab work for reversible causes normal.  EEG normal.    Feels like her memory has been stable if not improved since prior visit.  She was started on Lexapro by psychiatrist about 1.5 weeks ago and noticed great improvement of her mood as well as some improvement in memory.  Unfortunately, her cat passed away recently (was hit in the road) and has had a set back since then.  She also continues to follow with psychology. She notes some issues with losing train of thought during conversation and forgetting extended family members names.     History provided for reference purposes only Consult visit 01/06/2022 Dr. Delena Bali: The patient presents for evaluation of memory loss which has been present since 2017.   She has had multiple episodes where she has passed out. Started passing out in 2016. Would pass out multiple times per day. Initially saw Cardiology and underwent a negative cardiac workup. Last episode of syncope was in 2018.   Had an episode of amnesia at [employer] in [place]. Went to lunch with a friend, then put her head on the table and lost consciousness. Tried to pick her head up several times and was unable to. When she finally came to she did not know who she was, where she was, or where she lived. States she was a "blank slate" when she went home. Confusion improved somewhat and she began to remember who she was and who her family was. However she feels she has never fully gotten back to baseline since that episode. States she has still forgotten several old memories and had to relearn many things. Had to stop working.   She has persistent cognitive changes but does not feel they have progressed much over time. Continues to struggle with PTSD and depression. Mother of her grandchild recently overdosed and passed away in 22-Aug-2022 of this year.   Notes that she sees yellow lines whenever she looks at a white background. This is always present.   Had an EEG in 2017 which showed mild background slowing and was otherwise unremarkable. Brain MRI in 2017 showed age related white matter changes and was otherwise unremarkable.   She underwent a neuropsych evaluation which showed a significant psychological component to impairment in her working memory and recall. An early neurodegenerative process could not be ruled out. She was recommended to undergo repeat neuropsychological testing in 18 months.   [facility] 11/05/21 showed mild cerebral volume loss and chronic small vessel ischemic disease. No acute process. Carotid US was negative for  stenosis.   TBI:  No past history of TBI Stroke:  no past history of stroke Seizures:  no known history of seizures, multiple episodes of syncope Sleep: Takes valium to sleep Mood: History of PTSD on Prozac and Valium. She continues to have a depressed mood and frequently brings up her difficult childhood. States she has always had a difficult life. Left home at age 89.   Functional status:  Patient lives with her husband Cooking: Used to [employer] all the time, does not cook anymore.  Husband thinks she probably could start cooking again but seems too anxious to start Driving: She is not currently driving. Has not had issues getting to familiar places Bills: Used to manage finances but has not done as much recently Medications: Sometimes forgets to take her multivitamins Ever left the stove on by accident?: no Getting lost going to familiar places?: no Forgetting loved ones names?: yes, during episode of amnesia Word finding difficulty? yes   OTHER MEDICAL CONDITIONS: Von Willebrand disease, mitral valve prolapse, HLD, PTSD, migraines  REVIEW OF SYSTEMS: Out of a complete 14 system review of symptoms, the patient complains only of the following symptoms, and all other reviewed systems are negative.  See HPI  ALLERGIES: Allergies  Allergen Reactions  Aricept [Donepezil Hcl] Other (See Comments)    Nightmares, worsened mental health   Doxycycline Swelling   Tetanus Toxoids Other (See Comments)    PAIN IN NECK AND FACE   Flexeril [Cyclobenzaprine] Other (See Comments)    HOME MEDICATIONS: Outpatient Medications Prior to Visit  Medication Sig Dispense Refill   Ascorbic Acid (VITAMIN C) 100 MG tablet Take 100 mg by mouth daily.     benzonatate (TESSALON PERLES) 100 MG capsule Take 1 capsule (100 mg total) by mouth 3 (three) times daily as needed for cough. 40 capsule 1   Calcium-Magnesium 200-100 MG TABS Take 4 tablets by mouth daily. 2 tablets in the morning and 2 tablets in the  evening     diazepam (VALIUM) 5 MG tablet Take 1/4-1/2 tablet po QHS 45 tablet 0   ergocalciferol (DRISDOL) 200 MCG/ML drops      escitalopram (LEXAPRO) 10 MG tablet Take 1 tablet (10 mg total) by mouth daily. 90 tablet 1   MAGNESIUM PO Take 200 mg by mouth at bedtime.     meloxicam (MOBIC) 7.5 MG tablet Take 1 tablet (7.5 mg total) by mouth daily as needed for pain. 14 tablet 0   Multiple Vitamin (MULTIVITAMIN) capsule Take 1 capsule by mouth daily.     Omega-3 1000 MG CAPS Take by mouth.     TURMERIC PO Take by mouth.     No facility-administered medications prior to visit.    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   Benzodiazepine withdrawal without complication    Cervical radiculopathy at C6    Cheilitis    Chronic fatigue    Chronic obstructive pulmonary disease 09/07/2018   Quit smoking 1974 with copd changes on cxr but no limiting sob as of 09/06/2018  - 09/06/2018   Walked RA  2 laps @  approx 215ft each @ fast pace  stopped due to  End of study, no sob and sats  100% at end   Fibromyalgia 06/01/2015   History of chicken pox 09/15/2019   History of COVID-19 02/05/2020   History of measles    History of mumps    History of rubella    History of syncope    Insomnia 06/01/2015   Iron deficiency anemia    Left shoulder pain 10/31/2019   Leukopenia 09/15/2019   Low back pain 09/08/2021   Mitral valve prolapse    Mixed hyperlipidemia 11/04/2021   Neuritis    Palpitations    Pharyngitis    Psychogenic nonepileptic seizure    PTSD (post-traumatic stress disorder) 09/15/2019   Restless legs syndrome    Right sided weakness 01/10/2020   Upper airway cough syndrome 09/06/2018   Onset Nov 2019 with uri  - Sinus CT 56/12/2018 neg    Visual disturbance 11/02/2021   Vitamin D deficiency    Von Willebrand disease     PAST SURGICAL HISTORY: Past Surgical History:   Procedure Laterality Date   NO PAST SURGERIES      FAMILY HISTORY: Family History  Problem Relation Age of Onset   Stroke Mother    Dementia Mother        with advanced age   Emphysema Father        smoked   Alcohol abuse Father    Heart failure Maternal Grandmother    Heart attack Maternal Grandfather    Heart failure Paternal Grandmother  Heart attack Paternal Grandfather    OCD Son    Drug abuse Son    Heart attack Maternal Uncle    Seizures Neg Hx     SOCIAL HISTORY: Social History   Socioeconomic History   Marital status: Married    Spouse name: tony   Number of children: 4   Years of education: 12   Highest education level: High school graduate  Occupational History   Occupation: Retired  Tobacco Use   Smoking status: Former    Current packs/day: 0.00    Average packs/day: 0.5 packs/day for 10.0 years (5.0 ttl pk-yrs)    Types: Cigarettes    Start date: 04/27/1962    Quit date: 04/27/1972    Years since quitting: 50.8   Smokeless tobacco: Never  Vaping Use   Vaping status: Never Used  Substance and Sexual Activity   Alcohol use: Yes    Alcohol/week: 0.0 standard drinks of alcohol    Comment: occ 1 glass wine or beer   Drug use: No   Sexual activity: Not on file  Other Topics Concern   Not on file  Social History Narrative   Lives at home with husband   Caffeine use- tea, 1 cup/day   Social Determinants of Health   Financial Resource Strain: Low Risk  (06/15/2022)   Overall Financial Resource Strain (CARDIA)    Difficulty of Paying Living Expenses: Not hard at all  Food Insecurity: No Food Insecurity (06/15/2022)   Hunger Vital Sign    Worried About Running Out of Food in the Last Year: Never true    Ran Out of Food in the Last Year: Never true  Transportation Needs: No Transportation Needs (06/15/2022)   PRAPARE - Administrator, Civil Service (Medical): No    Lack of Transportation (Non-Medical): No  Physical Activity: Inactive  (02/08/2021)   Exercise Vital Sign    Days of Exercise per Week: 0 days    Minutes of Exercise per Session: 0 min  Stress: No Stress Concern Present (06/15/2022)   [employer] of Occupational Health - Occupational Stress Questionnaire    Feeling of Stress : Not at all  Social Connections: Moderately Isolated (06/15/2022)   Social Connection and Isolation Panel [NHANES]    Frequency of Communication with Friends and Family: Once a week    Frequency of Social Gatherings with Friends and Family: Never    Attends Religious Services: More than 4 times per year    Active Member of [employer] or Organizations: No    Attends Banker Meetings: Never    Marital Status: Married  Catering manager Violence: Not At Risk (06/15/2022)   Humiliation, Afraid, Rape, and Kick questionnaire    Fear of Current or Ex-Partner: No    Emotionally Abused: No    Physically Abused: No    Sexually Abused: No    PHYSICAL EXAM  Vitals:   02/01/23 1425  BP: 108/70  Pulse: 68  Resp: 17  Height: 5\' 5"  (1.651 m)    Body mass index is 21.13 kg/m.    02/01/2023    2:27 PM 07/12/2022    1:20 PM 02/09/2022    2:30 PM 01/06/2022   10:19 AM  Montreal Cognitive Assessment   Visuospatial/ Executive (0/5) 4 5 5 5   Naming (0/3) 3 3 3 3   Attention: Read list of digits (0/2) 2 2 2 2   Attention: Read list of letters (0/1) 1 1 1 1   Attention: Serial 7 subtraction starting  at 100 (0/3) 3 3 2 3   Language: Repeat phrase (0/2) 1 2 2 2   Language : Fluency (0/1) 1 1 1 1   Abstraction (0/2) 1 1 2 2   Delayed Recall (0/5) 0 0 0 0  Orientation (0/6) 4 3 4 4   Total 20 21 22 23   Adjusted Score (based on education)  21 22 23    Generalized: Well developed, in no acute distress  Neurological examination  Mentation: Alert oriented to time, place, history taking. Follows all commands speech and language fluent. Does repeat herself.  Cranial nerve II-XII: Pupils were equal round reactive to light. Extraocular movements  were full, visual field were full on confrontational test. Facial sensation and strength were normal. Head turning and shoulder shrug  were normal and symmetric. Motor: The motor testing reveals 5 over 5 strength of all 4 extremities. Good symmetric motor tone is noted throughout.  Sensory: Sensory testing is intact to soft touch on all 4 extremities. No evidence of extinction is noted.  Coordination: Cerebellar testing reveals good finger-nose-finger and heel-to-shin bilaterally.  Gait and station: Gait is normal.  Reflexes: Deep tendon reflexes are symmetric and normal bilaterally.   DIAGNOSTIC DATA (LABS, IMAGING, TESTING) - I reviewed patient records, labs, notes, testing and imaging myself where available.  Lab Results  Component Value Date   WBC 4.1 11/28/2022   HGB 12.8 11/28/2022   HCT 39.7 11/28/2022   MCV 94.9 11/28/2022   PLT 254.0 11/28/2022      Component Value Date/Time   NA 140 11/28/2022 1308   K 4.1 11/28/2022 1308   CL 106 11/28/2022 1308   CO2 24 11/28/2022 1308   GLUCOSE 86 11/28/2022 1308   BUN 16 11/28/2022 1308   CREATININE 0.96 11/28/2022 1308   CREATININE 0.97 (H) 01/10/2020 1358   CALCIUM 9.3 11/28/2022 1308   PROT 6.5 11/28/2022 1308   ALBUMIN 4.2 11/28/2022 1308   AST 24 11/28/2022 1308   ALT 19 11/28/2022 1308   ALKPHOS 62 11/28/2022 1308   BILITOT 0.6 11/28/2022 1308   Lab Results  Component Value Date   CHOL 222 (H) 11/28/2022   HDL 66.30 11/28/2022   LDLCALC 134 (H) 11/28/2022   TRIG 107.0 11/28/2022   CHOLHDL 3 11/28/2022   No results found for: "HGBA1C" Lab Results  Component Value Date   VITAMINB12 989 01/06/2022   Lab Results  Component Value Date   TSH 1.95 11/28/2022    Margie Ege, AGNP-C, DNP 02/01/2023, 2:43 PM [facility] [address], [address] [place], [place] [address] [phone]

## 2023-02-14 ENCOUNTER — Ambulatory Visit (INDEPENDENT_AMBULATORY_CARE_PROVIDER_SITE_OTHER): Payer: Medicare Other | Admitting: Psychiatry

## 2023-02-14 DIAGNOSIS — F32A Depression, unspecified: Secondary | ICD-10-CM

## 2023-02-14 DIAGNOSIS — F4310 Post-traumatic stress disorder, unspecified: Secondary | ICD-10-CM

## 2023-02-14 MED ORDER — ESCITALOPRAM OXALATE 10 MG PO TABS
10.0000 mg | ORAL_TABLET | Freq: Every day | ORAL | 0 refills | Status: DC
Start: 2023-02-14 — End: 2023-03-30

## 2023-02-14 NOTE — Progress Notes (Unsigned)
Viana Sleep [id number] [birth year] 77 y.o.  Subjective:   Patient ID:  Dawn Simmons is a 77 y.o. (DOB 02/09/1946) female.  Chief Complaint:  Chief Complaint  Patient presents with   Anxiety    Anxiety     Dawn Simmons presents to the office today for follow-up of anxiety and sleep disturbance. She reports, "I'm having a hard day today... my brain is sort of on vacation today... can't catch the rhythm today." She reports, "I've been doing well." She reports that she occasionally gets a feeling her chest/torso that "comes and goes." She reports it "sort of does" feel like anxiety. She reports that it is not associated with dizziness- "it's just an uneasy feeling." Slept well last night and has been sleeping well in general. Reports taking Diazepam 1/2 tablet at bedtime. She reports that on a few occasions recently she took 1/2 of Lexapro. Denies panic. Denies worry. Denies sad mood. Energy has been ok. She and her husband have been walking some mornings. She reports difficulty with concentration and having multiple thoughts simultaneous. Appetite has been good. Denies SI.   She has contemplated getting a dog and purchased a stuffed animal/dog instead, and reports that this is a source of comfort. She reports that she decided walking and cleaning up after the dog. She reports that feeling in her chest improved after holding stuffed dog.   She reports that her friend for many years commented that she is "back" and was more involved in conversation like she had been in the past.   Enjoying the changing leaves.   Diazepam last filled 10/23/22.  Past Psychiatric Medication Trials: (She reports that she is very sensitive to medication) Diazepam- Prescribed as need with limited improvement.  Prozac- reports that she has to take Prozac 20 mg in divided doses.  Lexapro Aricept- Nightmares, worsening anxiety  GAD-7    Flowsheet Row Office Visit from 01/27/2022 in [facility] Primary  Care at [facility]  Total GAD-7 Score 0      PHQ2-9    Flowsheet Row Office Visit from 01/10/2023 in [facility] Primary Care at [facility] Office Visit from 10/03/2022 in [facility] Primary Care at [facility] Clinical Support from 06/15/2022 in [facility] Primary Care at [facility] Office Visit from 03/31/2022 in [facility] Primary Care at [facility] Office Visit from 02/14/2022 in [facility] Primary Care at [facility]  PHQ-2 Total Score 0 0 0 0 0      Flowsheet Row ED from 10/08/2022 in [facility] Emergency Department at [facility]  C-SSRS RISK CATEGORY No Risk        Review of Systems:  Review of Systems  Musculoskeletal:  Negative for gait problem.  Neurological:  Negative for tremors.  Psychiatric/Behavioral:         Please refer to HPI    Medications: I have reviewed the patient's current medications.  Current Outpatient Medications  Medication Sig Dispense Refill   diazepam (VALIUM) 5 MG tablet Take 1/4-1/2 tablet po QHS 45 tablet 0   Ascorbic Acid (VITAMIN C) 100 MG tablet Take 100 mg by mouth daily.     benzonatate (TESSALON PERLES) 100 MG capsule Take 1 capsule (100 mg total) by mouth 3 (three) times daily as needed for cough. 40 capsule 1   Calcium-Magnesium 200-100 MG TABS Take 4 tablets by mouth daily. 2 tablets in the morning and 2 tablets in the  evening     ergocalciferol (DRISDOL) 200 MCG/ML drops      escitalopram (LEXAPRO) 10 MG tablet Take 1 tablet (10 mg total) by mouth daily. 90 tablet 0   MAGNESIUM PO Take 200 mg by mouth at bedtime.     meloxicam (MOBIC) 7.5 MG tablet Take 1 tablet (7.5 mg total) by mouth daily as needed for pain. 14 tablet 0   Multiple Vitamin (MULTIVITAMIN) capsule Take 1 capsule by mouth daily.     Omega-3 1000 MG CAPS Take by mouth.     TURMERIC PO Take by mouth.     No current facility-administered medications for this  visit.    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   Benzodiazepine withdrawal without complication    Cervical radiculopathy at C6    Cheilitis    Chronic fatigue    Chronic obstructive pulmonary disease 09/07/2018   Quit smoking 1974 with copd changes on cxr but no limiting sob as of 09/06/2018  - 09/06/2018   Walked RA  2 laps @  approx 241ft each @ fast pace  stopped due to  End of study, no sob and sats  100% at end   Fibromyalgia 06/01/2015   History of chicken pox 09/15/2019   History of COVID-19 02/05/2020   History of measles    History of mumps    History of rubella    History of syncope    Insomnia 06/01/2015   Iron deficiency anemia    Left shoulder pain 10/31/2019   Leukopenia 09/15/2019   Low back pain 09/08/2021   Mitral valve prolapse    Mixed hyperlipidemia 11/04/2021   Neuritis    Palpitations    Pharyngitis    Psychogenic nonepileptic seizure    PTSD (post-traumatic stress disorder) 09/15/2019   Restless legs syndrome    Right sided weakness 01/10/2020   Upper airway cough syndrome 09/06/2018   Onset Nov 2019 with uri  - Sinus CT 56/12/2018 neg    Visual disturbance 11/02/2021   Vitamin D deficiency    Von Willebrand disease     Past Medical History, Surgical history, Social history, and Family history were reviewed and updated as appropriate.   Please see review of systems for further details on the patient's review from today.   Objective:   Physical Exam:  There were no vitals taken for this visit.  Physical Exam Constitutional:      General: She is not  in acute distress. Musculoskeletal:        General: No deformity.  Neurological:     Mental Status: She is alert and oriented to person, place, and time.     Coordination: Coordination normal.  Psychiatric:        Attention and Perception: Perception normal. She does not perceive auditory or visual hallucinations.        Mood and Affect: Mood is anxious. Mood is not depressed. Affect is not labile, blunt, angry or inappropriate.        Speech: Speech normal.        Behavior: Behavior normal.  Thought Content: Thought content normal. Thought content is not paranoid or delusional. Thought content does not include homicidal or suicidal ideation. Thought content does not include homicidal or suicidal plan.        Cognition and Memory: Cognition and memory normal.        Judgment: Judgment normal.     Comments: Insight intact Some difficulty with concentration. Distracted on exam.     Lab Review:     Component Value Date/Time   NA 140 11/28/2022 1308   K 4.1 11/28/2022 1308   CL 106 11/28/2022 1308   CO2 24 11/28/2022 1308   GLUCOSE 86 11/28/2022 1308   BUN 16 11/28/2022 1308   CREATININE 0.96 11/28/2022 1308   CREATININE 0.97 (H) 01/10/2020 1358   CALCIUM 9.3 11/28/2022 1308   PROT 6.5 11/28/2022 1308   ALBUMIN 4.2 11/28/2022 1308   AST 24 11/28/2022 1308   ALT 19 11/28/2022 1308   ALKPHOS 62 11/28/2022 1308   BILITOT 0.6 11/28/2022 1308       Component Value Date/Time   WBC 4.1 11/28/2022 1308   RBC 4.18 11/28/2022 1308   HGB 12.8 11/28/2022 1308   HCT 39.7 11/28/2022 1308   PLT 254.0 11/28/2022 1308   MCV 94.9 11/28/2022 1308   MCH 31.3 01/10/2020 1358   MCHC 32.2 11/28/2022 1308   RDW 13.7 11/28/2022 1308   LYMPHSABS 1.3 11/28/2022 1308   MONOABS 0.5 11/28/2022 1308   EOSABS 0.0 11/28/2022 1308   BASOSABS 0.0 11/28/2022 1308    No results found for: "POCLITH", "LITHIUM"   No results found for: "PHENYTOIN", "PHENOBARB", "VALPROATE", "CBMZ"    .res Assessment: Plan:    Teyona was seen today for anxiety.  Diagnoses and all orders for this visit:  PTSD (post-traumatic stress disorder) -     escitalopram (LEXAPRO) 10 MG tablet; Take 1 tablet (10 mg total) by mouth daily.  Depression, unspecified depression type -     escitalopram (LEXAPRO) 10 MG tablet; Take 1 tablet (10 mg total) by mouth daily.     Please see After Visit Summary for patient specific instructions.  No future appointments.  No orders of the defined types were placed in this encounter.   -------------------------------

## 2023-02-15 ENCOUNTER — Encounter: Payer: Self-pay | Admitting: Psychiatry

## 2023-02-22 ENCOUNTER — Encounter: Payer: Self-pay | Admitting: Psychiatry

## 2023-03-01 ENCOUNTER — Ambulatory Visit: Payer: Medicare Other | Admitting: Psychiatry

## 2023-03-23 ENCOUNTER — Ambulatory Visit: Payer: Medicare Other | Admitting: Family Medicine

## 2023-03-30 ENCOUNTER — Encounter: Payer: Self-pay | Admitting: Psychiatry

## 2023-03-30 ENCOUNTER — Ambulatory Visit (INDEPENDENT_AMBULATORY_CARE_PROVIDER_SITE_OTHER): Payer: Medicare Other | Admitting: Psychiatry

## 2023-03-30 DIAGNOSIS — F4310 Post-traumatic stress disorder, unspecified: Secondary | ICD-10-CM

## 2023-03-30 DIAGNOSIS — F32A Depression, unspecified: Secondary | ICD-10-CM

## 2023-03-30 MED ORDER — ESCITALOPRAM OXALATE 10 MG PO TABS
10.0000 mg | ORAL_TABLET | Freq: Every day | ORAL | 0 refills | Status: DC
Start: 2023-03-30 — End: 2023-04-20

## 2023-03-30 NOTE — Progress Notes (Signed)
Dawn Simmons [id number] Sep 15, 1945 77 y.o.  Subjective:   Patient ID:  Dawn Simmons is a 77 y.o. (DOB 1945-06-28) female.  Chief Complaint:  Chief Complaint  Patient presents with   Anxiety   Depression    Anxiety    Depression        Past medical history includes anxiety.    Dawn Simmons presents to the office today for follow-up of anxiety and insomnia. She reports some anxiety in response to memory changes, "it scares me." She reports that she is noticing some change in memory. She reports some sadness. She reports that she is hopeful about seeing neurologist next week. "I have no motivation." She reports feeling "more stressed over the last few weeks." She reports that she has not taken Diazepam recently since she seems to be sleeping without difficulty without it. Denies nightmares. Appetite has been ok. She reports that she is easily distracted. She is not enjoying things as much now that it is winter. She reports passive death wishes. Denies SI.   Lives near several family members.   Past Psychiatric Medication Trials: (She reports that she is very sensitive to medication) Diazepam- Prescribed as need with limited improvement.  Prozac- reports that she has to take Prozac 20 mg in divided doses.  Lexapro Aricept- Nightmares, worsening anxiety  GAD-7    Flowsheet Row Office Visit from 01/27/2022 in [facility] Primary Care at [facility]  Total GAD-7 Score 0      PHQ2-9    Flowsheet Row Office Visit from 01/10/2023 in [facility] Primary Care at [facility] Office Visit from 10/03/2022 in [facility] Primary Care at [facility] Clinical Support from 06/15/2022 in [facility] Primary Care at [facility] Office Visit from 03/31/2022 in [facility] Primary Care at [facility] Office Visit from 02/14/2022 in [facility] Primary Care at [facility]  PHQ-2 Total Score 0 0 0 0 0       Flowsheet Row ED from 10/08/2022 in [facility] Emergency Department at [facility]  C-SSRS RISK CATEGORY No Risk        Review of Systems:  Review of Systems  Gastrointestinal: Negative.   Musculoskeletal:  Negative for gait problem.  Neurological:  Negative for tremors.  Psychiatric/Behavioral:  Positive for depression.        Please refer to HPI    Medications: I have reviewed the patient's current medications.  Current Outpatient Medications  Medication Sig Dispense Refill   diazepam (VALIUM) 5 MG tablet Take 1/4-1/2 tablet po QHS (Patient taking differently: Take 1/4-1/2 tablet po at bedtime prn) 45 tablet 0   Ascorbic Acid (VITAMIN C) 100 MG tablet Take 100 mg by mouth daily. (Patient not taking: Reported on 03/30/2023)     Calcium-Magnesium 200-100 MG TABS Take 4 tablets by mouth daily. 2 tablets in the morning and 2 tablets in the evening (Patient not taking: Reported on 03/30/2023)     ergocalciferol (DRISDOL) 200 MCG/ML drops  (Patient not taking: Reported on 03/30/2023)     escitalopram (LEXAPRO) 10 MG tablet Take 1 tablet (10 mg total) by mouth daily. 90 tablet 0   meloxicam (MOBIC) 7.5 MG tablet Take 1 tablet (7.5 mg total) by mouth daily as needed for pain. (Patient not taking: Reported on 03/30/2023) 14 tablet 0   Multiple Vitamin (MULTIVITAMIN) capsule Take 1 capsule by mouth daily. (Patient not taking: Reported on 03/30/2023)  Omega-3 1000 MG CAPS Take by mouth. (Patient not taking: Reported on 03/30/2023)     TURMERIC PO Take by mouth. (Patient not taking: Reported on 03/30/2023)     No current facility-administered medications for this visit.    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   Benzodiazepine withdrawal without complication    Cervical radiculopathy at C6    Cheilitis    Chronic fatigue    Chronic obstructive pulmonary disease 09/07/2018   Quit smoking 1974 with copd changes on cxr but no limiting sob as of 09/06/2018  - 09/06/2018   Walked RA  2 laps @  approx 215ft each @ fast pace  stopped due to  End of study, no sob and sats  100% at end   Fibromyalgia 06/01/2015   History of chicken pox 09/15/2019   History of COVID-19 02/05/2020   History of measles    History of mumps    History of rubella    History of syncope    Insomnia 06/01/2015   Iron deficiency anemia    Left shoulder pain 10/31/2019   Leukopenia 09/15/2019   Low back pain 09/08/2021   Mitral valve prolapse    Mixed hyperlipidemia 11/04/2021   Neuritis    Palpitations    Pharyngitis    Psychogenic nonepileptic seizure    PTSD (post-traumatic stress disorder) 09/15/2019   Restless legs syndrome    Right sided weakness 01/10/2020   Upper airway cough syndrome 09/06/2018   Onset Nov 2019 with uri  - Sinus CT 56/12/2018 neg    Visual disturbance 11/02/2021   Vitamin D deficiency    Von Willebrand disease     Past Medical History, Surgical history, Social history, and Family history were reviewed and updated as appropriate.   Please see review of systems for further details on the patient's review from today.   Objective:   Physical Exam:  There were no vitals taken for this visit.  Physical Exam Constitutional:      General: She is not in acute distress. Musculoskeletal:        General: No deformity.  Neurological:     Mental Status: She is alert and oriented to person, place, and time.     Coordination: Coordination normal.  Psychiatric:        Attention and Perception: Attention and perception normal. She does not perceive auditory or  visual hallucinations.        Mood and Affect: Mood is anxious. Mood is not depressed. Affect is not labile, blunt, angry or inappropriate.        Speech: Speech normal.        Behavior: Behavior normal.        Thought Content: Thought content normal. Thought content is not paranoid or delusional. Thought content does not include homicidal or suicidal ideation. Thought content does not include homicidal or suicidal plan.        Cognition and Memory: Cognition normal. Memory is impaired.        Judgment: Judgment normal.     Comments: Insight fair  Lab Review:     Component Value Date/Time   NA 140 11/28/2022 1308   K 4.1 11/28/2022 1308   CL 106 11/28/2022 1308   CO2 24 11/28/2022 1308   GLUCOSE 86 11/28/2022 1308   BUN 16 11/28/2022 1308   CREATININE 0.96 11/28/2022 1308   CREATININE 0.97 (H) 01/10/2020 1358   CALCIUM 9.3 11/28/2022 1308   PROT 6.5 11/28/2022 1308   ALBUMIN 4.2 11/28/2022 1308   AST 24 11/28/2022 1308   ALT 19 11/28/2022 1308   ALKPHOS 62 11/28/2022 1308   BILITOT 0.6 11/28/2022 1308       Component Value Date/Time   WBC 4.1 11/28/2022 1308   RBC 4.18 11/28/2022 1308   HGB 12.8 11/28/2022 1308   HCT 39.7 11/28/2022 1308   PLT 254.0 11/28/2022 1308   MCV 94.9 11/28/2022 1308   MCH 31.3 01/10/2020 1358   MCHC 32.2 11/28/2022 1308   RDW 13.7 11/28/2022 1308   LYMPHSABS 1.3 11/28/2022 1308   MONOABS 0.5 11/28/2022 1308   EOSABS 0.0 11/28/2022 1308   BASOSABS 0.0 11/28/2022 1308    No results found for: "POCLITH", "LITHIUM"   No results found for: "PHENYTOIN", "PHENOBARB", "VALPROATE", "CBMZ"   .res Assessment: Plan:    38 minutes spent dedicated to the care of this patient on the date of this encounter to include pre-visit review of records, ordering of medication, post visit documentation, and face-to-face time with the patient discussing transferring her care to another provider at [facility] since this provider is leaving the practice. Will  continue Lexapro 10 mg daily for anxiety and depression.  Continue Diazepam as needed for insomnia and anxiety.  Patient advised to contact office with any questions, adverse effects, or acute worsening in signs and symptoms.   Allura was seen today for anxiety and depression.  Diagnoses and all orders for this visit:  PTSD (post-traumatic stress disorder) -     escitalopram (LEXAPRO) 10 MG tablet; Take 1 tablet (10 mg total) by mouth daily.  Depression, unspecified depression type -     escitalopram (LEXAPRO) 10 MG tablet; Take 1 tablet (10 mg total) by mouth daily.     Please see After Visit Summary for patient specific instructions.  No future appointments.   No orders of the defined types were placed in this encounter.   -------------------------------

## 2023-03-31 ENCOUNTER — Telehealth: Payer: Self-pay | Admitting: Psychiatry

## 2023-03-31 NOTE — Telephone Encounter (Signed)
Pt called at 4:30.  She said she forgot to ask Shanda Bumps at her appt yesterday if she could go down on Lexapro to 5mg .  She wants  to know if it's ok to cut the current pills in half and if so, she would like the refill Jessica sent yesterday changed to a 5mg  script. Confirmed pharmacy is correct.

## 2023-04-03 DIAGNOSIS — R413 Other amnesia: Secondary | ICD-10-CM | POA: Diagnosis not present

## 2023-04-03 NOTE — Telephone Encounter (Signed)
Patient said she had an appt with Neuro today and she would talk to him about it and then get back with [place]. She said she doesn't like to take medicine, said she didn't realize Lexapro was 10 mg.

## 2023-04-03 NOTE — Telephone Encounter (Signed)
LVM to RC 

## 2023-04-19 NOTE — Assessment & Plan Note (Signed)
 Supplement and monitor

## 2023-04-19 NOTE — Assessment & Plan Note (Signed)
 Encourage heart healthy diet such as MIND or DASH diet, increase exercise, avoid trans fats, simple carbohydrates and processed foods, consider a krill or fish or flaxseed oil cap daily.

## 2023-04-19 NOTE — Assessment & Plan Note (Signed)
 She continues to follow with neurology and manages at home with help of husband and family

## 2023-04-19 NOTE — Assessment & Plan Note (Signed)
 Cough is manageable at present

## 2023-04-20 ENCOUNTER — Encounter: Payer: Self-pay | Admitting: Family Medicine

## 2023-04-20 ENCOUNTER — Ambulatory Visit (INDEPENDENT_AMBULATORY_CARE_PROVIDER_SITE_OTHER): Payer: Medicare Other | Admitting: Family Medicine

## 2023-04-20 VITALS — BP 116/68 | HR 96 | Temp 97.6°F | Resp 18 | Ht 65.0 in | Wt 128.4 lb

## 2023-04-20 DIAGNOSIS — F419 Anxiety disorder, unspecified: Secondary | ICD-10-CM | POA: Diagnosis not present

## 2023-04-20 DIAGNOSIS — F4310 Post-traumatic stress disorder, unspecified: Secondary | ICD-10-CM | POA: Diagnosis not present

## 2023-04-20 DIAGNOSIS — G4700 Insomnia, unspecified: Secondary | ICD-10-CM

## 2023-04-20 DIAGNOSIS — E782 Mixed hyperlipidemia: Secondary | ICD-10-CM | POA: Diagnosis not present

## 2023-04-20 DIAGNOSIS — E559 Vitamin D deficiency, unspecified: Secondary | ICD-10-CM

## 2023-04-20 DIAGNOSIS — G3184 Mild cognitive impairment, so stated: Secondary | ICD-10-CM | POA: Diagnosis not present

## 2023-04-20 DIAGNOSIS — F32A Depression, unspecified: Secondary | ICD-10-CM

## 2023-04-20 DIAGNOSIS — R058 Other specified cough: Secondary | ICD-10-CM

## 2023-04-20 MED ORDER — ESCITALOPRAM OXALATE 10 MG PO TABS: 10.0000 mg | ORAL_TABLET | Freq: Every day | ORAL | 0 refills | Status: DC

## 2023-04-20 MED ORDER — DIAZEPAM 5 MG PO TABS: ORAL_TABLET | ORAL | 2 refills | Status: DC

## 2023-04-20 NOTE — Patient Instructions (Signed)
 Diazepam  as needed for anxiety   Lexapro  daily  Post-Traumatic Stress Disorder, Adult Post-traumatic stress disorder (PTSD) is a mental health condition. It may start after a life-changing event or trauma. PTSD can also occur in people who hear about trauma that happens to a close family member or friend. What are the causes? Events that can cause PTSD include: Situations where your life is in danger, such as in motor vehicle accidents or plane crashes. Violent crimes, such as kidnappings or public shootings. Disasters caused by nature or humans, such as fires or floods. Physical assault. Sexual assault. Any kind of abuse. PTSD can also occur after witnessing a violent event. This may be: In your community. At home (domestic violence). During a war. What increases the risk? You are more likely to develop this condition if: You are in or have been in the [employer]. Your life has been in danger. You have gone through a traumatic event, such as: An accident or serious illness. Rape or sexual assault. A terrorist act or gun violence. An accident or death involving a loved one. What are the signs or symptoms? PTSD symptoms may start soon after a traumatic event or weeks later. They can last from a month to years. Symptoms can affect relationships, work, and daily activities. They may include: Intrusive symptoms This is when you relive the trauma through: Dreams. Feelings of fear, horror, intense sadness, or anger. Unwanted memories. Physical reactions. These can include a fast heart rate, shortness of breath, sweating, and shaking. Flashbacks, or feeling like the past event is happening in the present. Avoidance symptoms This is when you avoid anything that reminds you of the trauma. You may: Lose interest or not take part in daily activities. Feel cut off from or stay away from other people. Isolate yourself. Increased arousal symptoms You may be very sensitive or react to  certain settings. You may: Be easily startled. Behave in a careless or self-destructive way. Become irritated easily. Feel worried and nervous. Have trouble focusing. Yell at or hit other people or objects. Have trouble sleeping. Negative mood and thoughts You may have negative thoughts and feelings about yourself and others. You may: Believe that you or others are bad. Not be able to remember certain parts of the traumatic event. Blame yourself or others for the trauma. Be unable to feel positive emotions, like happiness or love. How is this diagnosed? PTSD is diagnosed through an assessment by a mental health professional. Rosine will be asked questions about your symptoms. How is this treated? Treatment may include: Medicines to reduce PTSD symptoms. Therapy. This is done with therapists who are experts in the treatment of PTSD. It may include: Counseling (cognitive behavioral therapy, or CBT). Exposure therapy. Eye movement desensitization and reprocessing (EMDR) therapy. If you have other mental health problems, these can also be treated. Follow these instructions at home: Alcohol use Do not drink alcohol if: Your health care provider tells you not to drink. You are pregnant, may be pregnant, or are planning to become pregnant. If you drink alcohol: Limit how much you have to: 0-1 drink a day for women. 0-2 drinks a day for men. Know how much alcohol is in your drink. In the [place] one drink equals one 12 oz bottle of beer (355 mL), one 5 oz glass of wine (148 mL), or one 1 oz glass of hard liquor (44 mL). Activity Exercise regularly. Try to do at least 30 minutes of physical activity most days of the week. Practice  ways to calm yourself, such as: Breathing exercises. Meditation. Yoga. Listening to quiet music. Do not cut yourself off from other people. Make connections. Think about volunteering. This can help you feel more connected. Lifestyle Find a support group.  Groups are often available for [employer] veterans, trauma victims, and family members or caregivers. Try to get 7-9 hours of sleep each night. To help with sleep: Keep your bedroom cool and dark. Do not eat a heavy meal within 1 hour of bedtime. Avoid caffeine for at least 8 hours before bed. Avoid screen time before bed. This includes television, computers, tablets, and mobile phones. Do not useillegal drugs. Contact a local organization to find out if you are eligible for a service dog. General instructions Take over-the-counter and prescription medicines only as told by your health care provider. Take steps to help yourself feel safer at home. Think about putting a security system in your home. Work with a health care provider or therapist to help manage your symptoms. If your PTSD affects your marriage or family, get help from a family therapist. Let others know that you have PTSD. Tell them what may trigger symptoms. This can protect you and help others understand you better. Let all of your health care providers know you have PTSD. This is important if you are having surgery or need to go to the hospital. Where to find more information [facility] PTSD: ptsd.fitboxer.tn The [employer] of Mental Health: bloggercourse.com Contact a health care provider if: Your symptoms do not get better. You feel overwhelmed by your symptoms. You have trouble doing daily tasks. You have loss of appetite. You have trouble sleeping. Get help right away if: You have thoughts of hurting yourself or others. Get help right away if you feel like you may hurt yourself or others, or have thoughts about taking your own life. Go to your nearest emergency room or: Call 911. Call the National Suicide Prevention Lifeline at 6604148038 or 988. This is open 24 hours a day. Text the Crisis Text Line at 628-437-5072. Summary Post-traumatic stress disorder (PTSD) is a mental health condition. It is more likely to  start after a life-changing event or trauma. Treatment for PTSD may include medicines and therapy. Find a support group in your community. Get help right away if you have thoughts of hurting yourself or others. This information is not intended to replace advice given to you by your health care provider. Make sure you discuss any questions you have with your health care provider. Document Revised: 07/26/2021 Document Reviewed: 07/16/2021 Elsevier Patient Education  2024 [employer].

## 2023-04-22 ENCOUNTER — Encounter: Payer: Self-pay | Admitting: Family Medicine

## 2023-04-22 NOTE — Progress Notes (Signed)
 Subjective:    Patient ID: Dawn Simmons, female    DOB: December 18, 1945, 78 y.o.   MRN: [id number]  Chief Complaint  Patient presents with  . Follow-up    2 month    HPI Discussed the use of AI scribe software for clinical note transcription with the patient, who gave verbal consent to proceed.  History of Present Illness   The patient, with a history of anxiety, presents with worsening symptoms including dizziness, a sensation of impending doom, and difficulty breathing. They report these symptoms occur frequently and have been ongoing for the past five weeks. The patient describes the dizziness as a feeling of everything spinning and being off balance. They also report a sensation of crackling in their ears. The patient has previously been on diazepam  and Lexapro  for anxiety management but stopped taking them as they didn't feel it was helping. They also express concern about their memory, attributing their forgetfulness and difficulty processing information to early signs of dementia. The patient has a family history of alcoholism and dementia. They also mention a history of high cholesterol.        Past Medical History:  Diagnosis Date  . Allergic rhinitis   . 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 Benzodiazepine withdrawal without complication   . Cervical radiculopathy at C6   . Cheilitis   . Chronic fatigue   . Chronic obstructive pulmonary disease 09/07/2018   Quit smoking 1974 with copd changes on cxr but no limiting sob as of 09/06/2018  - 09/06/2018   Walked RA  2 laps @  approx 290ft each @ fast pace  stopped due to  End of study, no sob and sats  100% at end  . Fibromyalgia 06/01/2015  . History of chicken pox 09/15/2019  . History of COVID-19 02/05/2020  . History of measles   . History of mumps   . History of rubella   . History of syncope   . Insomnia 06/01/2015  . Iron deficiency anemia   . Left shoulder pain 10/31/2019  .  Leukopenia 09/15/2019  . Low back pain 09/08/2021  . Mitral valve prolapse   . Mixed hyperlipidemia 11/04/2021  . Neuritis   . Palpitations   . Pharyngitis   . Psychogenic nonepileptic seizure   . PTSD (post-traumatic stress disorder) 09/15/2019  . Restless legs syndrome   . Right sided weakness 01/10/2020  . Upper airway cough syndrome 09/06/2018   Onset Nov 2019 with uri  - Sinus CT 56/12/2018 neg   . Visual disturbance 11/02/2021  . Vitamin D  deficiency   . Von Willebrand disease     Past Surgical History:  Procedure Laterality Date  . NO PAST SURGERIES      Family History  Problem Relation Age of Onset  . Stroke Mother   . Dementia Mother        with advanced age  . Emphysema Father        smoked  . Alcohol abuse Father   . Heart failure Maternal Grandmother   . Heart attack Maternal Grandfather   . Heart failure Paternal Grandmother   . Heart attack Paternal Grandfather   . OCD Son   . Drug abuse Son   . Heart attack Maternal Uncle   . Seizures Neg Hx     Social History   Socioeconomic History  . Marital status: Married    Spouse name: tony  . Number of children: 4  . Years of education:  12  . Highest education level: High school graduate  Occupational History  . Occupation: Retired  Tobacco Use  . Smoking status: Former    Current packs/day: 0.00    Average packs/day: 0.5 packs/day for 10.0 years (5.0 ttl pk-yrs)    Types: Cigarettes    Start date: 04/27/1962    Quit date: 04/27/1972    Years since quitting: 51.0  . Smokeless tobacco: Never  Vaping Use  . Vaping status: Never Used  Substance and Sexual Activity  . Alcohol use: Yes    Alcohol/week: 0.0 standard drinks of alcohol    Comment: occ 1 glass wine or beer  . Drug use: No  . Sexual activity: Not on file  Other Topics Concern  . Not on file  Social History Narrative   Lives at home with husband   Caffeine use- tea, 1 cup/day   Social Drivers of Corporate Investment Banker Strain:  Low Risk  (06/15/2022)   Overall Financial Resource Strain (CARDIA)   . Difficulty of Paying Living Expenses: Not hard at all  Food Insecurity: No Food Insecurity (06/15/2022)   Hunger Vital Sign   . Worried About Programme Researcher, Broadcasting/film/video in the Last Year: Never true   . Ran Out of Food in the Last Year: Never true  Transportation Needs: No Transportation Needs (06/15/2022)   PRAPARE - Transportation   . Lack of Transportation (Medical): No   . Lack of Transportation (Non-Medical): No  Physical Activity: Inactive (02/08/2021)   Exercise Vital Sign   . Days of Exercise per Week: 0 days   . Minutes of Exercise per Session: 0 min  Stress: No Stress Concern Present (06/15/2022)   [employer] of Occupational Health - Occupational Stress Questionnaire   . Feeling of Stress : Not at all  Social Connections: Moderately Isolated (06/15/2022)   Social Connection and Isolation Panel [NHANES]   . Frequency of Communication with Friends and Family: Once a week   . Frequency of Social Gatherings with Friends and Family: Never   . Attends Religious Services: More than 4 times per year   . Active Member of Clubs or Organizations: No   . Attends Banker Meetings: Never   . Marital Status: Married  Catering Manager Violence: Not At Risk (06/15/2022)   Humiliation, Afraid, Rape, and Kick questionnaire   . Fear of Current or Ex-Partner: No   . Emotionally Abused: No   . Physically Abused: No   . Sexually Abused: No    Outpatient Medications Prior to Visit  Medication Sig Dispense Refill  . Ascorbic Acid (VITAMIN C) 100 MG tablet Take 100 mg by mouth daily.    . Calcium-Magnesium 200-100 MG TABS Take 4 tablets by mouth daily. 2 tablets in the morning and 2 tablets in the evening    . ergocalciferol  (DRISDOL) 200 MCG/ML drops     . meloxicam  (MOBIC ) 7.5 MG tablet Take 1 tablet (7.5 mg total) by mouth daily as needed for pain. 14 tablet 0  . Multiple Vitamin (MULTIVITAMIN) capsule Take 1 capsule  by mouth daily.    . Omega-3 1000 MG CAPS Take by mouth.    . TURMERIC PO Take by mouth.    . diazepam  (VALIUM ) 5 MG tablet Take 1/4-1/2 tablet po QHS (Patient taking differently: Take 1/4-1/2 tablet po at bedtime prn) 45 tablet 0  . escitalopram  (LEXAPRO ) 10 MG tablet Take 1 tablet (10 mg total) by mouth daily. 90 tablet 0   No facility-administered  medications prior to visit.    Allergies  Allergen Reactions  . Aricept  [Donepezil  Hcl] Other (See Comments)    Nightmares, worsened mental health  . Doxycycline Swelling  . Tetanus Toxoids Other (See Comments)    PAIN IN NECK AND FACE  . Flexeril [Cyclobenzaprine] Other (See Comments)    Review of Systems  Constitutional:  Positive for malaise/fatigue. Negative for fever.  HENT:  Negative for congestion.   Eyes:  Negative for blurred vision.  Respiratory:  Positive for shortness of breath.   Cardiovascular:  Positive for chest pain and palpitations. Negative for leg swelling.  Gastrointestinal:  Negative for abdominal pain, blood in stool and nausea.  Genitourinary:  Negative for dysuria and frequency.  Musculoskeletal:  Negative for falls.  Skin:  Negative for rash.  Neurological:  Negative for dizziness, loss of consciousness and headaches.  Endo/Heme/Allergies:  Negative for environmental allergies.  Psychiatric/Behavioral:  Positive for depression and memory loss. The patient is nervous/anxious.       Objective:    Physical Exam Constitutional:      General: She is not in acute distress.    Appearance: Normal appearance. She is well-developed. She is not toxic-appearing.  HENT:     Head: Normocephalic and atraumatic.     Right Ear: External ear normal.     Left Ear: External ear normal.     Nose: Nose normal.  Eyes:     General:        Right eye: No discharge.        Left eye: No discharge.     Conjunctiva/sclera: Conjunctivae normal.  Neck:     Thyroid : No thyromegaly.  Cardiovascular:     Rate and Rhythm:  Normal rate and regular rhythm.     Heart sounds: Normal heart sounds. No murmur heard. Pulmonary:     Effort: Pulmonary effort is normal. No respiratory distress.     Breath sounds: Normal breath sounds.  Abdominal:     General: Bowel sounds are normal.     Palpations: Abdomen is soft.     Tenderness: There is no abdominal tenderness. There is no guarding.  Musculoskeletal:        General: Normal range of motion.     Cervical back: Neck supple.  Lymphadenopathy:     Cervical: No cervical adenopathy.  Skin:    General: Skin is warm and dry.  Neurological:     Mental Status: She is alert and oriented to person, place, and time.  Psychiatric:        Mood and Affect: Mood normal.        Behavior: Behavior normal.        Thought Content: Thought content normal.        Judgment: Judgment normal.   BP 116/68 (BP Location: Right Arm, Patient Position: Sitting, Cuff Size: Normal)   Pulse 96   Temp 97.6 F (36.4 C) (Oral)   Resp 18   Ht 5' 5 (1.651 m)   Wt 128 lb 6.4 oz (58.2 kg)   SpO2 99%   BMI 21.37 kg/m  Wt Readings from Last 3 Encounters:  04/20/23 128 lb 6.4 oz (58.2 kg)  01/10/23 127 lb (57.6 kg)  10/10/22 124 lb 12.8 oz (56.6 kg)    Diabetic Foot Exam - Simple   No data filed    Lab Results  Component Value Date   WBC 4.1 11/28/2022   HGB 12.8 11/28/2022   HCT 39.7 11/28/2022   PLT 254.0 11/28/2022  GLUCOSE 86 11/28/2022   CHOL 222 (H) 11/28/2022   TRIG 107.0 11/28/2022   HDL 66.30 11/28/2022   LDLCALC 134 (H) 11/28/2022   ALT 19 11/28/2022   AST 24 11/28/2022   NA 140 11/28/2022   K 4.1 11/28/2022   CL 106 11/28/2022   CREATININE 0.96 11/28/2022   BUN 16 11/28/2022   CO2 24 11/28/2022   TSH 1.95 11/28/2022    Lab Results  Component Value Date   TSH 1.95 11/28/2022   Lab Results  Component Value Date   WBC 4.1 11/28/2022   HGB 12.8 11/28/2022   HCT 39.7 11/28/2022   MCV 94.9 11/28/2022   PLT 254.0 11/28/2022   Lab Results  Component  Value Date   NA 140 11/28/2022   K 4.1 11/28/2022   CO2 24 11/28/2022   GLUCOSE 86 11/28/2022   BUN 16 11/28/2022   CREATININE 0.96 11/28/2022   BILITOT 0.6 11/28/2022   ALKPHOS 62 11/28/2022   AST 24 11/28/2022   ALT 19 11/28/2022   PROT 6.5 11/28/2022   ALBUMIN 4.2 11/28/2022   CALCIUM 9.3 11/28/2022   GFR 57.68 (L) 11/28/2022   Lab Results  Component Value Date   CHOL 222 (H) 11/28/2022   Lab Results  Component Value Date   HDL 66.30 11/28/2022   Lab Results  Component Value Date   LDLCALC 134 (H) 11/28/2022   Lab Results  Component Value Date   TRIG 107.0 11/28/2022   Lab Results  Component Value Date   CHOLHDL 3 11/28/2022   No results found for: HGBA1C     Assessment & Plan:  Amnestic MCI (mild cognitive impairment with memory loss) Assessment & Plan: She continues to follow with neurology and manages at home with help of husband and family   Mixed hyperlipidemia Assessment & Plan: Encourage heart healthy diet such as MIND or DASH diet, increase exercise, avoid trans fats, simple carbohydrates and processed foods, consider a krill or fish or flaxseed oil cap daily.     Vitamin D  deficiency Assessment & Plan: Supplement and monitor    Upper airway cough syndrome Assessment & Plan: Cough is manageable at present   PTSD (post-traumatic stress disorder) -     diazePAM ; Take 1/4-1/2 tablet po QHS  Dispense: 45 tablet; Refill: 2 -     Escitalopram  Oxalate; Take 1 tablet (10 mg total) by mouth daily.  Dispense: 90 tablet; Refill: 0  Anxiety and depression -     Ambulatory referral to Behavioral Health  Insomnia, unspecified type -     diazePAM ; Take 1/4-1/2 tablet po QHS  Dispense: 45 tablet; Refill: 2  Depression, unspecified depression type -     Escitalopram  Oxalate; Take 1 tablet (10 mg total) by mouth daily.  Dispense: 90 tablet; Refill: 0    Assessment and Plan    Chronic Dizziness Persistent dizziness with a sensation of being off  balance, spinning, and wooziness. Possible contributing factors include anxiety, early dementia, and eustachian tube dysfunction. -Start Diazepam  as needed for acute episodes of dizziness. -Start Lexapro  daily for chronic management of dizziness. -Encourage hydration with 10 ounces of water every 1-2 hours. -Encourage protein intake every 4 hours. -Plan for a 6-week virtual follow-up to reassess symptoms.  Anxiety Chronic anxiety possibly exacerbated by early dementia and physical aging. -Continue Diazepam  and Lexapro  as discussed above. -Refer to behavioral health for further management. -Encourage learning something new to help stabilize neuronal loss.  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 -Plan to address further after managing anxiety and dizziness. -Consider starting memory medications like Aricept  in the future.  Eustachian Tube Dysfunction Crackling in ears likely due to fluid in eustachian tubes. -No specific intervention at this time. -Explain the normal aging process and its effects on the eustachian tubes.  General Health Maintenance -Encourage hydration and regular protein intake as discussed above. -Plan for in-person follow-up in 3 months. -Plan for blood work at the one-year mark.         Harlene Horton, MD

## 2023-05-10 ENCOUNTER — Encounter: Payer: Self-pay | Admitting: Physician Assistant

## 2023-05-10 ENCOUNTER — Ambulatory Visit: Payer: Medicare Other | Admitting: Physician Assistant

## 2023-05-10 ENCOUNTER — Ambulatory Visit: Payer: Self-pay | Admitting: Family Medicine

## 2023-05-10 VITALS — BP 107/69 | HR 84 | Temp 98.4°F | Ht 65.0 in | Wt 131.5 lb

## 2023-05-10 DIAGNOSIS — R051 Acute cough: Secondary | ICD-10-CM

## 2023-05-10 LAB — POCT INFLUENZA A/B
Influenza A, POC: NEGATIVE
Influenza B, POC: NEGATIVE

## 2023-05-10 LAB — POC COVID19 BINAXNOW: SARS Coronavirus 2 Ag: NEGATIVE

## 2023-05-10 MED ORDER — BENZONATATE 100 MG PO CAPS: 100.0000 mg | ORAL_CAPSULE | Freq: Two times a day (BID) | ORAL | 0 refills | Status: DC | PRN

## 2023-05-10 NOTE — Progress Notes (Signed)
Established patient visit   Patient: Dawn Simmons   DOB: Mar 18, 1946   78 y.o. Female  MRN: [id number] Visit Date: 05/10/2023  Today's healthcare provider: Alfredia Ferguson, PA-C   Chief Complaint  Patient presents with   Cough    Things started this morning- States there is some mucus with it. No other symptoms   Subjective     Pt, with a history of COPD, nonepilipetic seizures, fibromyalgia, presents today with a productive cough starting this morning. She does not regularly use any inhalers. She denies SOB, fevers. Not taking anything over the counter.   Medications: Outpatient Medications Prior to Visit  Medication Sig   Ascorbic Acid (VITAMIN C) 100 MG tablet Take 100 mg by mouth daily.   Calcium-Magnesium 200-100 MG TABS Take 4 tablets by mouth daily. 2 tablets in the morning and 2 tablets in the evening   diazepam (VALIUM) 5 MG tablet Take 1/4-1/2 tablet po QHS   ergocalciferol (DRISDOL) 200 MCG/ML drops    escitalopram (LEXAPRO) 10 MG tablet Take 1 tablet (10 mg total) by mouth daily.   meloxicam (MOBIC) 7.5 MG tablet Take 1 tablet (7.5 mg total) by mouth daily as needed for pain.   Multiple Vitamin (MULTIVITAMIN) capsule Take 1 capsule by mouth daily.   Omega-3 1000 MG CAPS Take by mouth.   TURMERIC PO Take by mouth.   No facility-administered medications prior to visit.    Review of Systems  Constitutional:  Negative for fatigue and fever.  Respiratory:  Positive for cough. Negative for shortness of breath.   Cardiovascular:  Negative for chest pain and leg swelling.  Gastrointestinal:  Negative for abdominal pain.  Neurological:  Negative for dizziness and headaches.       Objective    BP 107/69   Pulse 84   Temp 98.4 F (36.9 C) (Oral)   Ht 5\' 5"  (1.651 m)   Wt 131 lb 8 oz (59.6 kg)   SpO2 97%   BMI 21.88 kg/m    Physical Exam Constitutional:      General: She is awake.     Appearance: She is well-developed.  HENT:     Head:  Normocephalic.  Eyes:     Conjunctiva/sclera: Conjunctivae normal.  Cardiovascular:     Rate and Rhythm: Normal rate and regular rhythm.     Heart sounds: Normal heart sounds.  Pulmonary:     Effort: Pulmonary effort is normal.     Breath sounds: Normal breath sounds. No wheezing, rhonchi or rales.  Skin:    General: Skin is warm.  Neurological:     Mental Status: She is alert and oriented to person, place, and time.  Psychiatric:        Attention and Perception: Attention normal.        Mood and Affect: Mood normal.        Speech: Speech normal.        Behavior: Behavior is cooperative.      Results for orders placed or performed in visit on 05/10/23  POCT Influenza A/B  Result Value Ref Range   Influenza A, POC Negative Negative   Influenza B, POC Negative Negative  POC COVID-19  Result Value Ref Range   SARS Coronavirus 2 Ag Negative Negative    Assessment & Plan    Acute cough -     POCT Influenza A/B -     POC COVID-19 BinaxNow -     Benzonatate; Take 1 capsule (100 mg  total) by mouth 2 (two) times daily as needed for cough.  Dispense: 20 capsule; Refill: 0   Poc covid, flu, negative but symptoms just started today. Advised it may be too early for POC tests to test positive.  Rest, hydrate, tylenol, add antihistamine OTC. Rx tessalon. If symptoms worsen or last longer than 7 days please d/b with office.  Return if symptoms worsen or fail to improve.       Alfredia Ferguson, PA-C  [facility] [phone] (phone) [phone] (fax)  [facility]

## 2023-05-10 NOTE — Telephone Encounter (Signed)
  Chief Complaint: barky cough  Symptoms: congestion Frequency: today Pertinent Negatives: Patient denies fever, difficulty breathing, chest pain, hemoptysis Disposition: [] ED /[] Urgent Care (no appt availability in office) / [x] Appointment(In office/virtual)/ []  [facility]/ [] Home Care/ [] Refused Recommended Disposition /[] [facility]/ []  Follow-up with PCP Additional Notes: Patient c/o barky cough since this morning. Reports she was baseline last night and woke up this AM with strong, barky cough. Denies fevers, SOB, chest pain. Scheduled patient per protocol on 05/10/2023. Patient verbalized understanding and to call back with worsening symptoms.    Copied from CRM [id number]. Topic: Clinical - Red Word Triage >> May 10, 2023  9:29 AM Shelbie Proctor wrote: Red Word that prompted transfer to Nurse Triage: Patient [phone] is having cannot stop having barky cough- phlegm white in color, head congestion, sore throat and pain. Patient is asking for advise on what to do? Patient denies a fever, headaches or shortness of breath, Reason for Disposition  SEVERE coughing spells (e.g., whooping sound after coughing, vomiting after coughing)  Answer Assessment - Initial Assessment Questions 1. ONSET: "When did the cough begin?"      This AM 2. SEVERITY: "How bad is the cough today?"      Barky, cough - really bad 3. SPUTUM: "Describe the color of your sputum" (none, dry cough; clear, white, yellow, green)     white 4. HEMOPTYSIS: "Are you coughing up any blood?" If so ask: "How much?" (flecks, streaks, tablespoons, etc.)     no 5. DIFFICULTY BREATHING: "Are you having difficulty breathing?" If Yes, ask: "How bad is it?" (e.g., mild, moderate, severe)    - MILD: No SOB at rest, mild SOB with walking, speaks normally in sentences, can lie down, no retractions, pulse < 100.    - MODERATE: SOB at rest, SOB with minimal exertion and prefers to sit, cannot lie down flat, speaks in  phrases, mild retractions, audible wheezing, pulse 100-120.    - SEVERE: Very SOB at rest, speaks in single words, struggling to breathe, sitting hunched forward, retractions, pulse > 120      I don't know 6. FEVER: "Do you have a fever?" If Yes, ask: "What is your temperature, how was it measured, and when did it start?"     no 7. CARDIAC HISTORY: "Do you have any history of heart disease?" (e.g., heart attack, congestive heart failure)      no 8. LUNG HISTORY: "Do you have any history of lung disease?"  (e.g., pulmonary embolus, asthma, emphysema)     denies 9. PE RISK FACTORS: "Do you have a history of blood clots?" (or: recent major surgery, recent prolonged travel, bedridden)     no 10. OTHER SYMPTOMS: "Do you have any other symptoms?" (e.g., runny nose, wheezing, chest pain)       No, chest congestion  12. TRAVEL: "Have you traveled out of the country in the last month?" (e.g., travel history, exposures)       no  Protocols used: Cough - Acute Non-Productive-A-AH

## 2023-05-12 ENCOUNTER — Ambulatory Visit (INDEPENDENT_AMBULATORY_CARE_PROVIDER_SITE_OTHER): Payer: Medicare Other | Admitting: Family

## 2023-05-12 ENCOUNTER — Encounter: Payer: Self-pay | Admitting: Family

## 2023-05-12 VITALS — BP 128/79 | HR 104 | Ht 65.0 in | Wt 129.4 lb

## 2023-05-12 DIAGNOSIS — J209 Acute bronchitis, unspecified: Secondary | ICD-10-CM | POA: Diagnosis not present

## 2023-05-12 MED ORDER — AZITHROMYCIN 250 MG PO TABS: ORAL_TABLET | ORAL | 0 refills | Status: DC

## 2023-05-12 MED ORDER — PROMETHAZINE-DM 6.25-15 MG/5ML PO SYRP: 5.0000 mL | ORAL_SOLUTION | Freq: Four times a day (QID) | ORAL | 0 refills | Status: DC | PRN

## 2023-05-12 NOTE — Progress Notes (Signed)
Dawn Simmons is a 78 y.o. 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   Allergic rhinitis    Cervical radiculopathy at C6    Chronic fatigue    Psychogenic nonepileptic seizure    Iron deficiency anemia    Neuritis    Restless legs syndrome    Vitamin D deficiency    Cheilitis    Mixed hyperlipidemia 11/04/2021   Visual disturbance 11/02/2021   Low back pain 09/08/2021   History of COVID-19 02/05/2020   Right sided weakness 01/10/2020   Left shoulder pain 10/31/2019   Leukopenia 09/15/2019   PTSD (post-traumatic stress disorder) 09/15/2019   History of chicken pox 09/15/2019   Von Willebrand disease    Chronic obstructive pulmonary disease 09/07/2018   Upper airway cough syndrome 09/06/2018   Fibromyalgia 06/01/2015   Insomnia 06/01/2015   History of syncope    Palpitations    Mitral valve prolapse     Current Outpatient Medications  Medication Sig Dispense Refill   azithromycin (ZITHROMAX) 250 MG tablet Take 2 tab po the first day then take 1 tablet po daily for 4 days 6 tablet 0   promethazine-dextromethorphan (PROMETHAZINE-DM) 6.25-15 MG/5ML syrup Take 5 mLs by mouth 4 (four) times daily as needed for cough. 118 mL 0   Ascorbic Acid (VITAMIN C) 100 MG tablet Take 100 mg by mouth daily.     benzonatate (TESSALON) 100 MG capsule Take 1 capsule (100 mg total) by mouth 2 (two) times daily as needed for cough. 20 capsule 0   Calcium-Magnesium 200-100 MG TABS Take 4 tablets by mouth daily. 2 tablets in the morning and 2 tablets in the evening     diazepam (VALIUM) 5 MG tablet Take 1/4-1/2 tablet po QHS 45 tablet 2   ergocalciferol (DRISDOL) 200 MCG/ML drops      escitalopram (LEXAPRO) 10 MG tablet Take 1 tablet (10 mg  total) by mouth daily. 90 tablet 0   meloxicam (MOBIC) 7.5 MG tablet Take 1 tablet (7.5 mg total) by mouth daily as needed for pain. 14 tablet 0   Multiple Vitamin (MULTIVITAMIN) capsule Take 1 capsule by mouth daily.     Omega-3 1000 MG CAPS Take by mouth.     TURMERIC PO Take by mouth.     No current facility-administered medications for this visit.    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   Benzodiazepine withdrawal without complication    Cervical radiculopathy at C6    Cheilitis    Chronic fatigue    Chronic obstructive pulmonary disease 09/07/2018   Quit smoking 1974 with copd changes on cxr but no limiting sob as of 09/06/2018  - 09/06/2018   Walked RA  2 laps @  approx 257ft each @ fast pace  stopped due to  End of study, no sob and sats  100% at end   Fibromyalgia 06/01/2015   History of chicken pox 09/15/2019   History of COVID-19 02/05/2020   History of measles    History of mumps    History of rubella    History of syncope  Insomnia 06/01/2015   Iron deficiency anemia    Left shoulder pain 10/31/2019   Leukopenia 09/15/2019   Low back pain 09/08/2021   Mitral valve prolapse    Mixed hyperlipidemia 11/04/2021   Neuritis    Palpitations    Pharyngitis    Psychogenic nonepileptic seizure    PTSD (post-traumatic stress disorder) 09/15/2019   Restless legs syndrome    Right sided weakness 01/10/2020   Upper airway cough syndrome 09/06/2018   Onset Nov 2019 with uri  - Sinus CT 56/12/2018 neg    Visual disturbance 11/02/2021   Vitamin D deficiency    Von Willebrand disease     Past Surgical History:  Procedure Laterality Date   NO PAST SURGERIES      Family History  Problem Relation Age of Onset   Stroke Mother    Dementia Mother        with advanced age   Emphysema  Father        smoked   Alcohol abuse Father    Heart failure Maternal Grandmother    Heart attack Maternal Grandfather    Heart failure Paternal Grandmother    Heart attack Paternal Grandfather    OCD Son    Drug abuse Son    Heart attack Maternal Uncle    Seizures Neg Hx     Social History   Tobacco Use   Smoking status: Former    Current packs/day: 0.00    Average packs/day: 0.5 packs/day for 10.0 years (5.0 ttl pk-yrs)    Types: Cigarettes    Start date: 04/27/1962    Quit date: 04/27/1972    Years since quitting: 51.0   Smokeless tobacco: Never  Substance Use Topics   Alcohol use: Yes    Alcohol/week: 0.0 standard drinks of alcohol    Comment: occ 1 glass wine or beer    Subjective:   Seen 2 days ago and felt to have viral URI; negative COVID and flu test on Wednesday; feels that symptoms are worsening; no fever; was prescribed Tessalon Perles but no relief; no other OTC cough/ cold medications;   Objective:  Vitals:   05/12/23 1035  BP: 128/79  Pulse: (!) 104  SpO2: 97%  Weight: 129 lb 6.4 oz (58.7 kg)  Height: 5\' 5"  (1.651 m)    General: Well developed, well nourished, in no acute distress  Skin : Warm and dry.  Head: Normocephalic and atraumatic  Lungs: Respirations unlabored; clear to auscultation bilaterally without wheeze, rales, rhonchi  CVS exam: normal rate and regular rhythm.  Neurologic: Alert and oriented; speech intact; face symmetrical; moves all extremities well; CNII-XII intact without focal deficit   Assessment:  1. Acute bronchitis, unspecified organism     Plan:  Rx for Z-pak #1 take as directed; Rx for Promethazine- DM 1 tsp po q 4-6 hours; okay to continue Tylenol; increase fluids, rest and follow up worse, no better.   No follow-ups on file.  No orders of the defined types were placed in this encounter.   Requested Prescriptions   Signed Prescriptions Disp Refills   azithromycin (ZITHROMAX) 250 MG tablet 6 tablet 0    Sig: Take 2  tab po the first day then take 1 tablet po daily for 4 days   promethazine-dextromethorphan (PROMETHAZINE-DM) 6.25-15 MG/5ML syrup 118 mL 0    Sig: Take 5 mLs by mouth 4 (four) times daily as needed for cough.

## 2023-06-20 ENCOUNTER — Ambulatory Visit (INDEPENDENT_AMBULATORY_CARE_PROVIDER_SITE_OTHER): Payer: Medicare Other

## 2023-06-20 VITALS — Ht 65.0 in | Wt 129.0 lb

## 2023-06-20 DIAGNOSIS — Z0000 Encounter for general adult medical examination without abnormal findings: Secondary | ICD-10-CM | POA: Diagnosis not present

## 2023-06-20 NOTE — Patient Instructions (Addendum)
 Ms. Dawn Simmons , Thank you for taking time to come for your Medicare Wellness Visit. I appreciate your ongoing commitment to your health goals. Please review the following plan we discussed and let me know if I can assist you in the future.   Referrals/Orders/Follow-Ups/Clinician Recommendations:   This is a list of the screening recommended for you and due dates:  Health Maintenance  Topic Date Due   DTaP/Tdap/Td vaccine (1 - Tdap) Never done   Zoster (Shingles) Vaccine (1 of 2) Never done   COVID-19 Vaccine (3 - 2024-25 season) 12/11/2022   Medicare Annual Wellness Visit  06/19/2024   Pneumonia Vaccine  Completed   Flu Shot  Completed   DEXA scan (bone density measurement)  Completed   HPV Vaccine  Aged Out   Hepatitis C Screening  Discontinued   Cologuard (Stool DNA test)  Discontinued    Advanced directives: (Provided) Advance directive discussed with you today. I have provided a copy for you to complete at home and have notarized. Once this is complete, please bring a copy in to our office so we can scan it into your chart.   Next Medicare Annual Wellness Visit scheduled for next year: Yes

## 2023-06-20 NOTE — Progress Notes (Signed)
 Subjective:   Dawn Simmons is a 78 y.o. female who presents for Medicare Annual (Subsequent) preventive examination.  Visit Complete: Virtual I connected with  Dawn Simmons on 06/20/23 by a audio enabled telemedicine application and verified that I am speaking with the correct person using two identifiers.  Patient Location: Home  Provider Location: Home Office  I discussed the limitations of evaluation and management by telemedicine. The patient expressed understanding and agreed to proceed.  Vital Signs: Because this visit was a virtual/telehealth visit, some criteria may be missing or patient reported. Any vitals not documented were not able to be obtained and vitals that have been documented are patient reported.    Cardiac Risk Factors include: advanced age (>50men, >34 women)     Objective:    Today's Vitals   06/20/23 1357  Weight: 129 lb (58.5 kg)  Height: 5\' 5"  (1.651 m)   Body mass index is 21.47 kg/m.     06/20/2023    2:07 PM 10/08/2022   11:40 PM 06/15/2022    2:27 PM 09/15/2021    2:51 PM 02/08/2021    3:47 PM 12/13/2019   11:25 AM  Advanced Directives  Does Patient Have a Medical Advance Directive? No No No Yes No Yes  Type of Theme park manager;Living will  Healthcare Power of [place];Living will  Does patient want to make changes to medical advance directive?      No - Patient declined  Copy of Healthcare Power of Attorney in Chart?    No - copy requested  No - copy requested  Would patient like information on creating a medical advance directive? No - Patient declined No - Patient declined No - Patient declined       Current Medications (verified) Outpatient Encounter Medications as of 06/20/2023  Medication Sig   Ascorbic Acid (VITAMIN C) 100 MG tablet Take 100 mg by mouth daily.   azithromycin (ZITHROMAX) 250 MG tablet Take 2 tab po the first day then take 1 tablet po daily for 4 days   benzonatate (TESSALON) 100 MG  capsule Take 1 capsule (100 mg total) by mouth 2 (two) times daily as needed for cough.   Calcium-Magnesium 200-100 MG TABS Take 4 tablets by mouth daily. 2 tablets in the morning and 2 tablets in the evening   diazepam (VALIUM) 5 MG tablet Take 1/4-1/2 tablet po QHS   ergocalciferol (DRISDOL) 200 MCG/ML drops    escitalopram (LEXAPRO) 10 MG tablet Take 1 tablet (10 mg total) by mouth daily.   meloxicam (MOBIC) 7.5 MG tablet Take 1 tablet (7.5 mg total) by mouth daily as needed for pain.   Multiple Vitamin (MULTIVITAMIN) capsule Take 1 capsule by mouth daily.   Omega-3 1000 MG CAPS Take by mouth.   promethazine-dextromethorphan (PROMETHAZINE-DM) 6.25-15 MG/5ML syrup Take 5 mLs by mouth 4 (four) times daily as needed for cough.   TURMERIC PO Take by mouth.   No facility-administered encounter medications on file as of 06/20/2023.    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   Benzodiazepine withdrawal without complication    Cervical radiculopathy at C6    Cheilitis    Chronic fatigue    Chronic obstructive pulmonary disease 09/07/2018   Quit smoking 1974 with copd changes on cxr but no limiting sob  as of 09/06/2018  - 09/06/2018   Walked RA  2 laps @  approx 222ft each @ fast pace  stopped due to  End of study, no sob and sats  100% at end   Fibromyalgia 06/01/2015   History of chicken pox 09/15/2019   History of COVID-19 02/05/2020   History of measles    History of mumps    History of rubella    History of syncope    Insomnia 06/01/2015   Iron deficiency anemia    Left shoulder pain 10/31/2019   Leukopenia 09/15/2019   Low back pain 09/08/2021   Mitral valve prolapse    Mixed hyperlipidemia 11/04/2021   Neuritis    Palpitations    Pharyngitis    Psychogenic nonepileptic  seizure    PTSD (post-traumatic stress disorder) 09/15/2019   Restless legs syndrome    Right sided weakness 01/10/2020   Upper airway cough syndrome 09/06/2018   Onset Nov 2019 with uri  - Sinus CT 56/12/2018 neg    Visual disturbance 11/02/2021   Vitamin D deficiency    Von Willebrand disease    Past Surgical History:  Procedure Laterality Date   NO PAST SURGERIES     Family History  Problem Relation Age of Onset   Stroke Mother    Dementia Mother        with advanced age   Emphysema Father        smoked   Alcohol abuse Father    Heart failure Maternal Grandmother    Heart attack Maternal Grandfather    Heart failure Paternal Grandmother    Heart attack Paternal Grandfather    OCD Son    Drug abuse Son    Heart attack Maternal Uncle    Seizures Neg Hx    Social History   Socioeconomic History   Marital status: Married    Spouse name: tony   Number of children: 4   Years of education: 12   Highest education level: High school graduate  Occupational History   Occupation: Retired  Tobacco Use   Smoking status: Former    Current packs/day: 0.00    Average packs/day: 0.5 packs/day for 10.0 years (5.0 ttl pk-yrs)    Types: Cigarettes    Start date: 04/27/1962    Quit date: 04/27/1972    Years since quitting: 51.1   Smokeless tobacco: Never  Vaping Use   Vaping status: Never Used  Substance and Sexual Activity   Alcohol use: Yes    Alcohol/week: 0.0 standard drinks of alcohol    Comment: occ 1 glass wine or beer   Drug use: No   Sexual activity: Not on file  Other Topics Concern   Not on file  Social History Narrative   Lives at home with husband   Caffeine use- tea, 1 cup/day   Social Drivers of Corporate investment banker Strain: Low Risk  (06/20/2023)   Overall Financial Resource Strain (CARDIA)    Difficulty of Paying Living Expenses: Not hard at all  Food Insecurity: No Food Insecurity (06/20/2023)   Hunger Vital Sign    Worried About Running Out of  Food in the Last Year: Never true    Ran Out of Food in the Last Year: Never true  Transportation Needs: No Transportation Needs (06/20/2023)   PRAPARE - Administrator, Civil Service (Medical): No    Lack of Transportation (Non-Medical): No  Physical Activity: Insufficiently Active (06/20/2023)   Exercise Vital Sign  Days of Exercise per Week: 4 days    Minutes of Exercise per Session: 30 min  Stress: No Stress Concern Present (06/20/2023)   [employer] of Occupational Health - Occupational Stress Questionnaire    Feeling of Stress : Not at all  Social Connections: Socially Integrated (06/20/2023)   Social Connection and Isolation Panel [NHANES]    Frequency of Communication with Friends and Family: More than three times a week    Frequency of Social Gatherings with Friends and Family: More than three times a week    Attends Religious Services: More than 4 times per year    Active Member of [employer] or Organizations: Yes    Attends Engineer, structural: More than 4 times per year    Marital Status: Married    Tobacco Counseling Counseling given: Not Answered   Clinical Intake:   Activities of Daily Living    06/20/2023    2:06 PM  In your present state of health, do you have any difficulty performing the following activities:  Hearing? 0  Vision? 0  Difficulty concentrating or making decisions? 0  Walking or climbing stairs? 0  Dressing or bathing? 0  Doing errands, shopping? 0  Preparing Food and eating ? N  Using the Toilet? N  In the past six months, have you accidently leaked urine? N  Do you have problems with loss of bowel control? N  Managing your Medications? N  Managing your Finances? N  Housekeeping or managing your Housekeeping? N    Patient Care Team: Bradd Canary, MD as PCP - General (Family Medicine) Wende Crease, PT as Physical Therapist (Physical Therapy)  Indicate any recent Medical Services you may have received from  other than [facility] providers in the past year (date may be approximate).     Assessment:   This is a routine wellness examination for [name].  Hearing/Vision screen Hearing Screening - Comments:: Denies hearing difficulties   Vision Screening - Comments:: Wears rx glasses - up to date with routine eye exams with  Dr Dione Booze   Goals Addressed               This Visit's Progress     Increase physical activity (pt-stated)        Remain active.       Depression Screen    06/20/2023    2:04 PM 01/10/2023    1:48 PM 10/03/2022    3:34 PM 06/15/2022    2:33 PM 03/31/2022    1:30 PM 02/14/2022   10:29 AM 01/27/2022   11:06 AM  PHQ 2/9 Scores  PHQ - 2 Score 0 0 0 0 0 0 2  PHQ- 9 Score       4    Fall Risk    06/20/2023    2:06 PM 01/10/2023    1:48 PM 10/03/2022    3:34 PM 06/15/2022    2:26 PM 03/31/2022    1:30 PM  Fall Risk   Falls in the past year? 0 0 0 0 0  Number falls in past yr: 0 0 0 0 0  Injury with Fall? 0 0 0 0 0  Risk for fall due to : No Fall Risks   No Fall Risks   Follow up Falls prevention discussed;Falls evaluation completed Falls evaluation completed Falls evaluation completed Falls evaluation completed Falls evaluation completed    MEDICARE RISK AT HOME: Medicare Risk at Home Any stairs in or around the home?: Yes If so,  are there any without handrails?: No Home free of loose throw rugs in walkways, pet beds, electrical cords, etc?: Yes Adequate lighting in your home to reduce risk of falls?: Yes Life alert?: No Use of a cane, walker or w/c?: No Grab bars in the bathroom?: No Shower chair or bench in shower?: No Elevated toilet seat or a handicapped toilet?: No  TIMED UP AND GO:  Was the test performed?  No    Cognitive Function:    06/15/2022    2:27 PM 12/13/2019   11:45 AM  MMSE - Mini Mental State Exam  Not completed: Unable to complete --      02/01/2023    2:27 PM 07/12/2022    1:20 PM 02/09/2022    2:30 PM 01/06/2022   10:19 AM   Montreal Cognitive Assessment   Visuospatial/ Executive (0/5) 4 5 5 5   Naming (0/3) 3 3 3 3   Attention: Read list of digits (0/2) 2 2 2 2   Attention: Read list of letters (0/1) 1 1 1 1   Attention: Serial 7 subtraction starting at 100 (0/3) 3 3 2 3   Language: Repeat phrase (0/2) 1 2 2 2   Language : Fluency (0/1) 1 1 1 1   Abstraction (0/2) 1 1 2 2   Delayed Recall (0/5) 0 0 0 0  Orientation (0/6) 4 3 4 4   Total 20 21 22 23   Adjusted Score (based on education)  21 22 23       06/20/2023    2:07 PM  6CIT Screen  What Year? 0 points  What month? 0 points  What time? 0 points  Count back from 20 0 points  Months in reverse 4 points  Repeat phrase 0 points  Total Score 4 points    Immunizations Immunization History  Administered Date(s) Administered   Fluad Quad(high Dose 65+) 01/25/2021, 02/14/2022   Fluad Trivalent(High Dose 65+) 01/10/2023   Influenza Split 02/15/2007, 01/02/2008, 02/05/2010, 02/15/2011, 01/25/2012, 01/09/2013   Influenza,inj,Quad PF,6+ Mos 02/04/2020   Influenza-Unspecified 12/04/2014, 01/05/2016, 12/19/2016, 01/12/2018, 02/08/2019, 02/15/2019   PFIZER(Purple Top)SARS-COV-2 Vaccination 09/19/2019, 10/11/2019   PNEUMOCOCCAL CONJUGATE-20 03/29/2021   Pneumococcal Conjugate-13 11/25/2013   Pneumococcal Polysaccharide-23 04/25/2007    TDAP status: Due, Education has been provided regarding the importance of this vaccine. Advised may receive this vaccine at local pharmacy or Health Dept. Aware to provide a copy of the vaccination record if obtained from local pharmacy or Health Dept. Verbalized acceptance and understanding.  Flu Vaccine status: Up to date  Pneumococcal vaccine status: Up to date  Covid-19 vaccine status: Declined, Education has been provided regarding the importance of this vaccine but patient still declined. Advised may receive this vaccine at local pharmacy or Health Dept.or vaccine clinic. Aware to provide a copy of the vaccination record if  obtained from local pharmacy or Health Dept. Verbalized acceptance and understanding.  Qualifies for Shingles Vaccine? Yes   Zostavax completed No   Shingrix Completed?: No.    Education has been provided regarding the importance of this vaccine. Patient has been advised to call insurance company to determine out of pocket expense if they have not yet received this vaccine. Advised may also receive vaccine at local pharmacy or Health Dept. Verbalized acceptance and understanding.  Screening Tests Health Maintenance  Topic Date Due   DTaP/Tdap/Td (1 - Tdap) Never done   Zoster Vaccines- Shingrix (1 of 2) Never done   COVID-19 Vaccine (3 - 2024-25 season) 12/11/2022   Medicare Annual Wellness (AWV)  06/19/2024  Pneumonia Vaccine 81+ Years old  Completed   INFLUENZA VACCINE  Completed   DEXA SCAN  Completed   HPV VACCINES  Aged Out   Hepatitis C Screening  Discontinued   Fecal DNA (Cologuard)  Discontinued    Health Maintenance  Health Maintenance Due  Topic Date Due   DTaP/Tdap/Td (1 - Tdap) Never done   Zoster Vaccines- Shingrix (1 of 2) Never done   COVID-19 Vaccine (3 - 2024-25 season) 12/11/2022        Bone Density status: Completed 09/24/20. Results reflect: Bone density results: OSTEOPENIA. Repeat every   years.     Additional Screening:    Vision Screening: Recommended annual ophthalmology exams for early detection of glaucoma and other disorders of the eye. Is the patient up to date with their annual eye exam?  Yes  Who is the provider or what is the name of the office in which the patient attends annual eye exams? Dr Dione Booze If pt is not established with a provider, would they like to be referred to a provider to establish care? No .   Dental Screening: Recommended annual dental exams for proper oral hygiene   Community Resource Referral / Chronic Care Management:  CRR required this visit?  No   CCM required this visit?      No Plan:     I have  personally reviewed and noted the following in the patient's chart:   Medical and social history Use of alcohol, tobacco or illicit drugs  Current medications and supplements including opioid prescriptions. Patient is not currently taking opioid prescriptions. Functional ability and status Nutritional status Physical activity Advanced directives List of other physicians Hospitalizations, surgeries, and ER visits in previous 12 months Vitals Screenings to include cognitive, depression, and falls Referrals and appointments  In addition, I have reviewed and discussed with patient certain preventive protocols, quality metrics, and best practice recommendations. A written personalized care plan for preventive services as well as general preventive health recommendations were provided to patient.     Tillie Rung, LPN   [id number]/61/[id number]   After Visit Summary: (MyChart) Due to this being a telephonic visit, the after visit summary with patients personalized plan was offered to patient via MyChart   Nurse Notes: None

## 2023-06-23 ENCOUNTER — Encounter: Payer: Self-pay | Admitting: Family Medicine

## 2023-08-14 ENCOUNTER — Ambulatory Visit: Payer: Medicare Other | Admitting: Family Medicine

## 2023-08-16 ENCOUNTER — Ambulatory Visit: Admitting: Family Medicine

## 2023-09-11 ENCOUNTER — Ambulatory Visit (INDEPENDENT_AMBULATORY_CARE_PROVIDER_SITE_OTHER): Admitting: Family Medicine

## 2023-09-11 ENCOUNTER — Encounter: Payer: Self-pay | Admitting: Family Medicine

## 2023-09-11 VITALS — BP 126/74 | HR 73 | Temp 98.0°F | Resp 16 | Ht 65.0 in | Wt 127.0 lb

## 2023-09-11 DIAGNOSIS — M549 Dorsalgia, unspecified: Secondary | ICD-10-CM | POA: Diagnosis not present

## 2023-09-11 DIAGNOSIS — S46819A Strain of other muscles, fascia and tendons at shoulder and upper arm level, unspecified arm, initial encounter: Secondary | ICD-10-CM | POA: Diagnosis not present

## 2023-09-11 DIAGNOSIS — M542 Cervicalgia: Secondary | ICD-10-CM

## 2023-09-11 NOTE — Patient Instructions (Addendum)
 Heat (pad or rice pillow in microwave) over affected area, 10-15 minutes twice daily.   Ice/cold pack over area for 10-15 min twice daily.  Let us  know if you need anything.  Foods that may reduce pain: 1) Ginger 2) Blueberries 3) Salmon 4) Pumpkin seeds 5) Dark chocolate 6) Turmeric 7) Tart cherries 8) Virgin olive oil 9) Chili peppers 10) Mint 11) Krill oil   EXERCISES RANGE OF MOTION (ROM) AND STRETCHING EXERCISES  These exercises may help you when beginning to rehabilitate your issue. In order to successfully resolve your symptoms, you must improve your posture. These exercises are designed to help reduce the forward-head and rounded-shoulder posture which contributes to this condition. Your symptoms may resolve with or without further involvement from your physician, physical therapist or athletic trainer. While completing these exercises, remember:  Restoring tissue flexibility helps normal motion to return to the joints. This allows healthier, less painful movement and activity. An effective stretch should be held for at least 20 seconds, although you may need to begin with shorter hold times for comfort. A stretch should never be painful. You should only feel a gentle lengthening or release in the stretched tissue. Do not do any stretch or exercise that you cannot tolerate.  STRETCH- Axial Extensors Lie on your back on the floor. You may bend your knees for comfort. Place a rolled-up hand towel or dish towel, about 2 inches in diameter, under the part of your head that makes contact with the floor. Gently tuck your chin, as if trying to make a "double chin," until you feel a gentle stretch at the base of your head. Hold 15-20 seconds. Repeat 2-3 times. Complete this exercise 1 time per day.   STRETCH - Axial Extension  Stand or sit on a firm surface. Assume a good posture: chest up, shoulders drawn back, abdominal muscles slightly tense, knees unlocked (if standing) and  feet hip width apart. Slowly retract your chin so your head slides back and your chin slightly lowers. Continue to look straight ahead. You should feel a gentle stretch in the back of your head. Be certain not to feel an aggressive stretch since this can cause headaches later. Hold for 15-20 seconds. Repeat 2-3 times. Complete this exercise 1 time per day.  STRETCH - Cervical Side Bend  Stand or sit on a firm surface. Assume a good posture: chest up, shoulders drawn back, abdominal muscles slightly tense, knees unlocked (if standing) and feet hip width apart. Without letting your nose or shoulders move, slowly tip your right / left ear to your shoulder until your feel a gentle stretch in the muscles on the opposite side of your neck. Hold 15-20 seconds. Repeat 2-3 times. Complete this exercise 1-2 times per day.  STRETCH - Cervical Rotators  Stand or sit on a firm surface. Assume a good posture: chest up, shoulders drawn back, abdominal muscles slightly tense, knees unlocked (if standing) and feet hip width apart. Keeping your eyes level with the ground, slowly turn your head until you feel a gentle stretch along the back and opposite side of your neck. Hold 15-20 seconds. Repeat 2-3 times. Complete this exercise 1-2 times per day.  RANGE OF MOTION - Neck Circles  Stand or sit on a firm surface. Assume a good posture: chest up, shoulders drawn back, abdominal muscles slightly tense, knees unlocked (if standing) and feet hip width apart. Gently roll your head down and around from the back of one shoulder to the back of  the other. The motion should never be forced or painful. Repeat the motion 10-20 times, or until you feel the neck muscles relax and loosen. Repeat 2-3 times. Complete the exercise 1-2 times per day. STRENGTHENING EXERCISES - Cervical Strain and Sprain These exercises may help you when beginning to rehabilitate your injury. They may resolve your symptoms with or without further  involvement from your physician, physical therapist, or athletic trainer. While completing these exercises, remember:  Muscles can gain both the endurance and the strength needed for everyday activities through controlled exercises. Complete these exercises as instructed by your physician, physical therapist, or athletic trainer. Progress the resistance and repetitions only as guided. You may experience muscle soreness or fatigue, but the pain or discomfort you are trying to eliminate should never worsen during these exercises. If this pain does worsen, stop and make certain you are following the directions exactly. If the pain is still present after adjustments, discontinue the exercise until you can discuss the trouble with your clinician.  STRENGTH - Cervical Flexors, Isometric Face a wall, standing about 6 inches away. Place a small pillow, a ball about 6-8 inches in diameter, or a folded towel between your forehead and the wall. Slightly tuck your chin and gently push your forehead into the soft object. Push only with mild to moderate intensity, building up tension gradually. Keep your jaw and forehead relaxed. Hold 10 to 20 seconds. Keep your breathing relaxed. Release the tension slowly. Relax your neck muscles completely before you start the next repetition. Repeat 2-3 times. Complete this exercise 1 time per day.  STRENGTH- Cervical Lateral Flexors, Isometric  Stand about 6 inches away from a wall. Place a small pillow, a ball about 6-8 inches in diameter, or a folded towel between the side of your head and the wall. Slightly tuck your chin and gently tilt your head into the soft object. Push only with mild to moderate intensity, building up tension gradually. Keep your jaw and forehead relaxed. Hold 10 to 20 seconds. Keep your breathing relaxed. Release the tension slowly. Relax your neck muscles completely before you start the next repetition. Repeat 2-3 times. Complete this exercise 1  time per day.  STRENGTH - Cervical Extensors, Isometric  Stand about 6 inches away from a wall. Place a small pillow, a ball about 6-8 inches in diameter, or a folded towel between the back of your head and the wall. Slightly tuck your chin and gently tilt your head back into the soft object. Push only with mild to moderate intensity, building up tension gradually. Keep your jaw and forehead relaxed. Hold 10 to 20 seconds. Keep your breathing relaxed. Release the tension slowly. Relax your neck muscles completely before you start the next repetition. Repeat 2-3 times. Complete this exercise 1 time per day.  POSTURE AND BODY MECHANICS CONSIDERATIONS Keeping correct posture when sitting, standing or completing your activities will reduce the stress put on different body tissues, allowing injured tissues a chance to heal and limiting painful experiences. The following are general guidelines for improved posture. Your physician or physical therapist will provide you with any instructions specific to your needs. While reading these guidelines, remember: The exercises prescribed by your provider will help you have the flexibility and strength to maintain correct postures. The correct posture provides the optimal environment for your joints to work. All of your joints have less wear and tear when properly supported by a spine with good posture. This means you will experience a healthier, less  painful body. Correct posture must be practiced with all of your activities, especially prolonged sitting and standing. Correct posture is as important when doing repetitive low-stress activities (typing) as it is when doing a single heavy-load activity (lifting).  PROLONGED STANDING WHILE SLIGHTLY LEANING FORWARD When completing a task that requires you to lean forward while standing in one place for a long time, place either foot up on a stationary 2- to 4-inch high object to help maintain the best posture. When both  feet are on the ground, the low back tends to lose its slight inward curve. If this curve flattens (or becomes too large), then the back and your other joints will experience too much stress, fatigue more quickly, and can cause pain.   RESTING POSITIONS Consider which positions are most painful for you when choosing a resting position. If you have pain with flexion-based activities (sitting, bending, stooping, squatting), choose a position that allows you to rest in a less flexed posture. You would want to avoid curling into a fetal position on your side. If your pain worsens with extension-based activities (prolonged standing, working overhead), avoid resting in an extended position such as sleeping on your stomach. Most people will find more comfort when they rest with their spine in a more neutral position, neither too rounded nor too arched. Lying on a non-sagging bed on your side with a pillow between your knees, or on your back with a pillow under your knees will often provide some relief. Keep in mind, being in any one position for a prolonged period of time, no matter how correct your posture, can still lead to stiffness.  WALKING Walk with an upright posture. Your ears, shoulders, and hips should all line up. OFFICE WORK When working at a desk, create an environment that supports good, upright posture. Without extra support, muscles fatigue and lead to excessive strain on joints and other tissues.  CHAIR: A chair should be able to slide under your desk when your back makes contact with the back of the chair. This allows you to work closely. The chair's height should allow your eyes to be level with the upper part of your monitor and your hands to be slightly lower than your elbows. Body position: Your feet should make contact with the floor. If this is not possible, use a foot rest. Keep your ears over your shoulders. This will reduce stress on your neck and low back.  Trapezius  stretches/exercises Do exercises exactly as told by your health care provider and adjust them as directed. It is normal to feel mild stretching, pulling, tightness, or discomfort as you do these exercises, but you should stop right away if you feel sudden pain or your pain gets worse.   Stretching and range of motion exercises These exercises warm up your muscles and joints and improve the movement and flexibility of your shoulder. These exercises can also help to relieve pain, numbness, and tingling. If you are unable to do any of the following for any reason, do not further attempt to do it.   Exercise A: Flexion, standing     Stand and hold a broomstick, a cane, or a similar object. Place your hands a little more than shoulder-width apart on the object. Your left / right hand should be palm-up, and your other hand should be palm-down. Push the stick to raise your left / right arm out to your side and then over your head. Use your other hand to help move the  stick. Stop when you feel a stretch in your shoulder, or when you reach the angle that is recommended by your health care provider. Avoid shrugging your shoulder while you raise your arm. Keep your shoulder blade tucked down toward your spine. Hold for 30 seconds. Slowly return to the starting position. Repeat 2 times. Complete this exercise 3 times per week.  Exercise B: Abduction, supine     Lie on your back and hold a broomstick, a cane, or a similar object. Place your hands a little more than shoulder-width apart on the object. Your left / right hand should be palm-up, and your other hand should be palm-down. Push the stick to raise your left / right arm out to your side and then over your head. Use your other hand to help move the stick. Stop when you feel a stretch in your shoulder, or when you reach the angle that is recommended by your health care provider. Avoid shrugging your shoulder while you raise your arm. Keep your shoulder  blade tucked down toward your spine. Hold for 30 seconds. Slowly return to the starting position. Repeat 2 times. Complete this exercise 3 times per week.  Exercise C: Flexion, active-assisted     Lie on your back. You may bend your knees for comfort. Hold a broomstick, a cane, or a similar object. Place your hands about shoulder-width apart on the object. Your palms should face toward your feet. Raise the stick and move your arms over your head and behind your head, toward the floor. Use your healthy arm to help your left / right arm move farther. Stop when you feel a gentle stretch in your shoulder, or when you reach the angle where your health care provider tells you to stop. Hold for 30 seconds. Slowly return to the starting position. Repeat 2 times. Complete this exercise 3 times per week.  Exercise D: External rotation and abduction     Stand in a door frame with one of your feet slightly in front of the other. This is called a staggered stance. Choose one of the following positions as told by your health care provider: Place your hands and forearms on the door frame above your head. Place your hands and forearms on the door frame at the height of your head. Place your hands on the door frame at the height of your elbows. Slowly move your weight onto your front foot until you feel a stretch across your chest and in the front of your shoulders. Keep your head and chest upright and keep your abdominal muscles tight. Hold for 30 seconds. To release the stretch, shift your weight to your back foot. Repeat 2 times. Complete this stretch 3 times per week.  Strengthening exercises These exercises build strength and endurance in your shoulder. Endurance is the ability to use your muscles for a long time, even after your muscles get tired. Exercise E: Scapular depression and adduction  Sit on a stable chair. Support your arms in front of you with pillows, armrests, or a tabletop. Keep  your elbows in line with the sides of your body. Gently move your shoulder blades down toward your middle back. Relax the muscles on the tops of your shoulders and in the back of your neck. Hold for 3 seconds. Slowly release the tension and relax your muscles completely before doing this exercise again. Repeat for a total of 10 repetitions. After you have practiced this exercise, try doing the exercise without the arm support.  Then, try the exercise while standing instead of sitting. Repeat 2 times. Complete this exercise 3 times per week.  Exercise F: Shoulder abduction, isometric     Stand or sit about 4-6 inches (10-15 cm) from a wall with your left / right side facing the wall. Bend your left / right elbow and gently press your elbow against the wall. Increase the pressure slowly until you are pressing as hard as you can without shrugging your shoulder. Hold for 3 seconds. Slowly release the tension and relax your muscles completely. Repeat for a total of 10 repetitions. Repeat 2 times. Complete this exercise 3 times per week.  Exercise G: Shoulder flexion, isometric     Stand or sit about 4-6 inches (10-15 cm) away from a wall with your left / right side facing the wall. Keep your left / right elbow straight and gently press the top of your fist against the wall. Increase the pressure slowly until you are pressing as hard as you can without shrugging your shoulder. Hold for 10-15 seconds. Slowly release the tension and relax your muscles completely. Repeat for a total of 10 repetitions. Repeat 2 times. Complete this exercise 3 times per week.  Exercise H: Internal rotation     Sit in a stable chair without armrests, or stand. Secure an exercise band at your left / right side, at elbow height. Place a soft object, such as a folded towel or a small pillow, under your left / right upper arm so your elbow is a few inches (about 8 cm) away from your side. Hold the end of the exercise  band so the band stretches. Keeping your elbow pressed against the soft object under your arm, move your forearm across your body toward your abdomen. Keep your body steady so the movement is only coming from your shoulder. Hold for 3 seconds. Slowly return to the starting position. Repeat for a total of 10 repetitions. Repeat 2 times. Complete this exercise 3 times per week.  Exercise I: External rotation     Sit in a stable chair without armrests, or stand. Secure an exercise band at your left / right side, at elbow height. Place a soft object, such as a folded towel or a small pillow, under your left / right upper arm so your elbow is a few inches (about 8 cm) away from your side. Hold the end of the exercise band so the band stretches. Keeping your elbow pressed against the soft object under your arm, move your forearm out, away from your abdomen. Keep your body steady so the movement is only coming from your shoulder. Hold for 3 seconds. Slowly return to the starting position. Repeat for a total of 10 repetitions. Repeat 2 times. Complete this exercise 3 times per week. Exercise J: Shoulder extension  Sit in a stable chair without armrests, or stand. Secure an exercise band to a stable object in front of you so the band is at shoulder height. Hold one end of the exercise band in each hand. Your palms should face each other. Straighten your elbows and lift your hands up to shoulder height. Step back, away from the secured end of the exercise band, until the band stretches. Squeeze your shoulder blades together and pull your hands down to the sides of your thighs. Stop when your hands are straight down by your sides. Do not let your hands go behind your body. Hold for 3 seconds. Slowly return to the starting position. Repeat for  a total of 10 repetitions. Repeat 2 times. Complete this exercise 3 times per week.  Exercise K: Shoulder extension, prone     Lie on your abdomen on a firm  surface so your left / right arm hangs over the edge. Hold a 5 lb weight in your hand so your palm faces in toward your body. Your arm should be straight. Squeeze your shoulder blade down toward the middle of your back. Slowly raise your arm behind you, up to the height of the surface that you are lying on. Keep your arm straight. Hold for 3 seconds. Slowly return to the starting position and relax your muscles. Repeat for a total of 10 repetitions. Repeat 2 times. Complete this exercise 3 times per week.   Exercise L: Horizontal abduction, prone  Lie on your abdomen on a firm surface so your left / right arm hangs over the edge. Hold a 5 lb weight in your hand so your palm faces toward your feet. Your arm should be straight. Squeeze your shoulder blade down toward the middle of your back. Bend your elbow so your hand moves up, until your elbow is bent to an "L" shape (90 degrees). With your elbow bent, slowly move your forearm forward and up. Raise your hand up to the height of the surface that you are lying on. Your upper arm should not move, and your elbow should stay bent. At the top of the movement, your palm should face the floor. Hold for 3 seconds. Slowly return to the starting position and relax your muscles. Repeat for a total of 10 repetitions. Repeat 2 times. Complete this exercise 3 times per week.  Exercise M: Horizontal abduction, standing  Sit on a stable chair, or stand. Secure an exercise band to a stable object in front of you so the band is at shoulder height. Hold one end of the exercise band in each hand. Straighten your elbows and lift your hands straight in front of you, up to shoulder height. Your palms should face down, toward the floor. Step back, away from the secured end of the exercise band, until the band stretches. Move your arms out to your sides, and keep your arms straight. Hold for 3 seconds. Slowly return to the starting position. Repeat for a total of  10 repetitions. Repeat 2 times. Complete this exercise 3 times per week.  Exercise N: Scapular retraction and elevation  Sit on a stable chair, or stand. Secure an exercise band to a stable object in front of you so the band is at shoulder height. Hold one end of the exercise band in each hand. Your palms should face each other. Sit in a stable chair without armrests, or stand. Step back, away from the secured end of the exercise band, until the band stretches. Squeeze your shoulder blades together and lift your hands over your head. Keep your elbows straight. Hold for 3 seconds. Slowly return to the starting position. Repeat for a total of 10 repetitions. Repeat 2 times. Complete this exercise 3 times per week.  This information is not intended to replace advice given to you by your health care provider. Make sure you discuss any questions you have with your health care provider. Document Released: 03/28/2005 Document Revised: 12/03/2015 Document Reviewed: 02/12/2015 [employer] Interactive Patient Education  2017 [employer]  Mid-Back Strain Rehab It is normal to feel mild stretching, pulling, tightness, or discomfort as you do these exercises, but you should stop right away if you  feel sudden pain or your pain gets worse.  Stretching and range of motion exercises This exercise warms up your muscles and joints and improves the movement and flexibility of your back and shoulders. This exercise also help to relieve pain. Exercise A: Chest and spine stretch  Lie down on your back on a firm surface. Roll a towel or a small blanket so it is about 4 inches (10 cm) in diameter. Put the towel lengthwise under the middle of your back so it is under your spine, but not under your shoulder blades. To increase the stretch, you may put your hands behind your head and let your elbows fall to your sides. Hold for 30 seconds. Repeat exercise 2 times. Complete this exercise 3 times per  week. Strengthening exercises These exercises build strength and endurance in your back and your shoulder blade muscles. Endurance is the ability to use your muscles for a long time, even after they get tired. Exercise C: Straight arm rows (shoulder extension) Stand with your feet shoulder width apart. Secure an exercise band to a stable object in front of you so the band is at or above shoulder height. Hold one end of the exercise band in each hand. Straighten your elbows and lift your hands up to shoulder height. Step back, away from the secured end of the exercise band, until the band stretches. Squeeze your shoulder blades together and pull your hands down to the sides of your thighs. Stop when your hands are straight down by your sides. Do not let your hands go behind your body. Hold for 2 seconds. Slowly return to the starting position. Repeat 2 times. Complete this exercise 3 times per week. Exercise D: Shoulder external rotation, prone Lie on your abdomen on a firm bed so your left / right forearm hangs over the edge of the bed and your upper arm is on the bed, straight out from your body. Your elbow should be bent. Your palm should be facing your feet. If instructed, hold a 2-5 lb weight in your hand. Squeeze your shoulder blade toward the middle of your back. Do not let your shoulder lift toward your ear. Keep your elbow bent in an "L" shape (90 degrees) while you slowly move your forearm up toward the ceiling. Move your forearm up to the height of the bed, toward your head. Your upper arm should not move. At the top of the movement, your palm should face the floor. Hold for 1 second. Slowly return to the starting position and relax your muscles. Repeat 2 times. Complete this exercise 3 times per week. Exercise E: Scapular retraction and external rotation, rowing  Sit in a stable chair without armrests, or stand. Secure an exercise band to a stable object in front of you so it  is at shoulder height. Hold one end of the exercise band in each hand. Bring your arms out straight in front of you. Step back, away from the secured end of the exercise band, until the band stretches. Pull the band backward. As you do this, bend your elbows and squeeze your shoulder blades together, but avoid letting the rest of your body move. Do not let your shoulders lift up toward your ears. Stop when your elbows are at your sides or slightly behind your body. Hold for 1 second1. Slowly straighten your arms to return to the starting position. Repeat 2 times. Complete this exercise 3 times per week. Posture and body mechanics  Body mechanics refers to  the movements and positions of your body while you do your daily activities. Posture is part of body mechanics. Good posture and healthy body mechanics can help to relieve stress in your body's tissues and joints. Good posture means that your spine is in its natural S-curve position (your spine is neutral), your shoulders are pulled back slightly, and your head is not tipped forward. The following are general guidelines for applying improved posture and body mechanics to your everyday activities. Standing  When standing, keep your spine neutral and your feet about hip-width apart. Keep a slight bend in your knees. Your ears, shoulders, and hips should line up. When you do a task in which you lean forward while standing in one place for a long time, place one foot up on a stable object that is 2-4 inches (5-10 cm) high, such as a footstool. This helps keep your spine neutral. Sitting  When sitting, keep your spine neutral and keep your feet flat on the floor. Use a footrest, if necessary, and keep your thighs parallel to the floor. Avoid rounding your shoulders, and avoid tilting your head forward. When working at a desk or a computer, keep your desk at a height where your hands are slightly lower than your elbows. Slide your chair under your desk  so you are close enough to maintain good posture. When working at a computer, place your monitor at a height where you are looking straight ahead and you do not have to tilt your head forward or downward to look at the screen. Resting  When lying down and resting, avoid positions that are most painful for you. If you have pain with activities such as sitting, bending, stooping, or squatting (flexion-based activities), lie in a position in which your body does not bend very much. For example, avoid curling up on your side with your arms and knees near your chest (fetal position). If you have pain with activities such as standing for a long time or reaching with your arms (extension-based activities), lie with your spine in a neutral position and bend your knees slightly. Try the following positions: Lying on your side with a pillow between your knees. Lying on your back with a pillow under your knees.  Lifting  When lifting objects, keep your feet at least shoulder-width apart and tighten your abdominal muscles. Bend your knees and hips and keep your spine neutral. It is important to lift using the strength of your legs, not your back. Do not lock your knees straight out. Always ask for help to lift heavy or awkward objects. Make sure you discuss any questions you have with your health care provider. Document Released: 03/28/2005 Document Revised: 12/03/2015 Document Reviewed: 01/07/2015 Elsevier Interactive Patient Education  [employer].

## 2023-09-11 NOTE — Addendum Note (Signed)
 Addended by: Creed Dodrill on: 09/11/2023 02:10 PM   Modules accepted: Level of Service

## 2023-09-11 NOTE — Progress Notes (Signed)
 Musculoskeletal Exam  Patient: Dawn Simmons DOB: 04/21/1945  DOS: 09/11/2023  SUBJECTIVE:  Chief Complaint:   Chief Complaint  Patient presents with   Dawn Simmons    Maty Zeisler is a 78 y.o.  female for evaluation and treatment of neck/shoulder pain. Here w spouse who helps w history.   Onset:  1 month ago.  Patient sustained a fall while walking up the stairs.  She fell up the stairs, not down and did not lose consciousness.  Location: upper shoulder/neck/back region Character:  aching and dull  Progression of issue:  is unchanged Associated symptoms: hurts to move arms and neck No bruising, redness, swelling, LOC.  Treatment: to date has been OTC NSAIDS, acetaminophen, and chronic Lexapro  and Valium .   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   Benzodiazepine withdrawal without complication    Cervical radiculopathy at C6    Cheilitis    Chronic fatigue    Chronic obstructive pulmonary disease 09/07/2018   Quit smoking 1974 with copd changes on cxr but no limiting sob as of 09/06/2018  - 09/06/2018   Walked RA  2 laps @  approx 254ft each @ fast pace  stopped due to  End of study, no sob and sats  100% at end   Fibromyalgia 06/01/2015   History of chicken pox 09/15/2019   History of COVID-19 02/05/2020   History of measles    History of mumps    History of rubella    History of syncope    Insomnia 06/01/2015   Iron deficiency anemia    Left shoulder pain 10/31/2019   Leukopenia 09/15/2019   Low back pain 09/08/2021   Mitral valve prolapse    Mixed hyperlipidemia 11/04/2021   Neuritis    Palpitations    Pharyngitis    Psychogenic nonepileptic seizure    PTSD (post-traumatic stress disorder) 09/15/2019   Restless legs syndrome    Right sided weakness 01/10/2020   Upper airway cough syndrome 09/06/2018   Onset Nov 2019 with uri  - Sinus CT  56/12/2018 neg    Visual disturbance 11/02/2021   Vitamin D  deficiency    Von Willebrand disease     Objective: VITAL SIGNS: BP 126/74 (BP Location: Left Arm, Patient Position: Sitting)   Pulse 73   Temp 98 F (36.7 C) (Oral)   Resp 16   Ht 5\' 5"  (1.651 m)   Wt 127 lb (57.6 kg)   SpO2 97%   BMI 21.13 kg/m  Constitutional: Well formed, well developed. No acute distress. Thorax & Lungs: No accessory muscle use Musculoskeletal: neck/shoulder.   Normal active range of motion: yes.   Normal passive range of motion: yes Tenderness to palpation: yes over trap, cerv parasp msc, lateral neck msc, and mid thor msc b/l Deformity: no Ecchymosis: no Tests positive: none Tests negative: Neer's, Spurling's Neurologic: Normal sensory function. No focal deficits noted. DTR's equal and symmetric in UE's. No clonus.  5/5 strength in the upper extremities. Psychiatric: Normal mood. Limited judgment and insight  Assessment:  Neck pain  Strain of trapezius muscle, unspecified laterality, initial encounter  Mid back pain  Plan: Stretches/exercises for trapezius musculature, neck, and mid back; heat, ice, Tylenol. Send message in 1 mo if not significantly better and will set up w PT.  She also reported random other pains that preceded her fall.  I gave her  a list of foods associated with lower inflammation to see if that helps.  If not improving, she will see her regular PCP. F/u prn. The patient and her spouse voiced understanding and agreement to the plan.  I spent 32 minutes with patient and her spouse discussing the above plans in addition to reviewing her chart and the same at the visit.   Shellie Dials [place], DO 09/11/23  2:09 PM

## 2023-09-27 NOTE — Assessment & Plan Note (Signed)
 Supplement and monitor

## 2023-09-27 NOTE — Assessment & Plan Note (Signed)
 Increase leafy greens, consider increased lean red meat and using cast iron cookware. Continue to monitor, report any concerns

## 2023-09-27 NOTE — Assessment & Plan Note (Signed)
 Encourage heart healthy diet such as MIND or DASH diet, increase exercise, avoid trans fats, simple carbohydrates and processed foods, consider a krill or fish or flaxseed oil cap daily.

## 2023-09-27 NOTE — Assessment & Plan Note (Signed)
 Hydrate and monitor

## 2023-09-28 ENCOUNTER — Encounter: Payer: Self-pay | Admitting: Family Medicine

## 2023-09-28 ENCOUNTER — Ambulatory Visit (INDEPENDENT_AMBULATORY_CARE_PROVIDER_SITE_OTHER): Admitting: Family Medicine

## 2023-09-28 VITALS — BP 118/66 | HR 88 | Resp 16 | Ht 65.0 in | Wt 125.6 lb

## 2023-09-28 DIAGNOSIS — E782 Mixed hyperlipidemia: Secondary | ICD-10-CM | POA: Diagnosis not present

## 2023-09-28 DIAGNOSIS — R296 Repeated falls: Secondary | ICD-10-CM | POA: Diagnosis not present

## 2023-09-28 DIAGNOSIS — E559 Vitamin D deficiency, unspecified: Secondary | ICD-10-CM | POA: Diagnosis not present

## 2023-09-28 DIAGNOSIS — R519 Headache, unspecified: Secondary | ICD-10-CM

## 2023-09-28 DIAGNOSIS — R058 Other specified cough: Secondary | ICD-10-CM

## 2023-09-28 DIAGNOSIS — M79604 Pain in right leg: Secondary | ICD-10-CM | POA: Diagnosis not present

## 2023-09-28 DIAGNOSIS — D509 Iron deficiency anemia, unspecified: Secondary | ICD-10-CM | POA: Diagnosis not present

## 2023-09-28 NOTE — Progress Notes (Signed)
 Subjective:    Patient ID: Leeanne Puffer, female    DOB: 10/23/1945, 78 y.o.   MRN: [id number]  Chief Complaint  Patient presents with   Acute Visit    Patient presents today for hemorrhoids.    Medical Management of Chronic Issues    Patient presents today for a follow-up   Quality Metric Gaps    TDAP, zoster    HPI Discussed the use of AI scribe software for clinical note transcription with the patient, who gave verbal consent to proceed.  History of Present Illness Tynasia Mccaul is a 78 year old female who presents with right-sided pain and hemorrhoids following a fall. She is accompanied by her husband, Baker Bon.  She has been experiencing significant right-sided pain since a fall approximately two months ago. The pain affects her entire right side, including her head, shoulder, and leg. Being right-handed, she relies heavily on her right side and feels it is not as functional as before.  She developed hemorrhoids following the fall, which have persisted for about two months. The hemorrhoids are uncomfortable, protrude, and cannot be manually reduced. There is no significant bleeding, but she experiences discomfort and itching. Over-the-counter treatments like Tux medicated pads and ointments provide only temporary relief.  She has noticed persistent bruising on her right side since the fall. The bruises are dark brown and have been slow to fade.  She has a history of constipation, which she manages with a fiber supplement, Pringles, and finds effective when taken regularly.  She is sensitive to mosquito bites, which limits her outdoor activities. She gets numerous bites whenever she goes outside, adding to her discomfort.    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   Benzodiazepine withdrawal without complication    Cervical radiculopathy at C6    Cheilitis    Chronic fatigue    Chronic  obstructive pulmonary disease 09/07/2018   Quit smoking 1974 with copd changes on cxr but no limiting sob as of 09/06/2018  - 09/06/2018   Walked RA  2 laps @  approx 257ft each @ fast pace  stopped due to  End of study, no sob and sats  100% at end   Fibromyalgia 06/01/2015   History of chicken pox 09/15/2019   History of COVID-19 02/05/2020   History of measles    History of mumps    History of rubella    History of syncope    Insomnia 06/01/2015   Iron deficiency anemia    Left shoulder pain 10/31/2019   Leukopenia 09/15/2019   Low back pain 09/08/2021   Mitral valve prolapse    Mixed hyperlipidemia 11/04/2021   Neuritis    Palpitations    Pharyngitis    Psychogenic nonepileptic seizure    PTSD (post-traumatic stress disorder) 09/15/2019   Restless legs syndrome    Right sided weakness 01/10/2020   Upper airway cough syndrome 09/06/2018   Onset Nov 2019 with uri  - Sinus CT 56/12/2018 neg    Visual disturbance 11/02/2021   Vitamin D  deficiency    Von Willebrand disease     Past Surgical History:  Procedure Laterality Date   NO PAST SURGERIES      Family History  Problem Relation Age of Onset   Stroke Mother    Dementia Mother        with advanced age   Emphysema Father  smoked   Alcohol abuse Father    Heart failure Maternal Grandmother    Heart attack Maternal Grandfather    Heart failure Paternal Grandmother    Heart attack Paternal Grandfather    OCD Son    Drug abuse Son    Heart attack Maternal Uncle    Seizures Neg Hx     Social History   Socioeconomic History   Marital status: Married    Spouse name: tony   Number of children: 4   Years of education: 12   Highest education level: High school graduate  Occupational History   Occupation: Retired  Tobacco Use   Smoking status: Former    Current packs/day: 0.00    Average packs/day: 0.5 packs/day for 10.0 years (5.0 ttl pk-yrs)    Types: Cigarettes    Start date: 04/27/1962    Quit date:  04/27/1972    Years since quitting: 51.4   Smokeless tobacco: Never  Vaping Use   Vaping status: Never Used  Substance and Sexual Activity   Alcohol use: Yes    Alcohol/week: 0.0 standard drinks of alcohol    Comment: occ 1 glass wine or beer   Drug use: No   Sexual activity: Not on file  Other Topics Concern   Not on file  Social History Narrative   Lives at home with husband   Caffeine use- tea, 1 cup/day   Social Drivers of Corporate investment banker Strain: Low Risk  (06/20/2023)   Overall Financial Resource Strain (CARDIA)    Difficulty of Paying Living Expenses: Not hard at all  Food Insecurity: No Food Insecurity (06/20/2023)   Hunger Vital Sign    Worried About Running Out of Food in the Last Year: Never true    Ran Out of Food in the Last Year: Never true  Transportation Needs: No Transportation Needs (06/20/2023)   PRAPARE - Administrator, Civil Service (Medical): No    Lack of Transportation (Non-Medical): No  Physical Activity: Insufficiently Active (06/20/2023)   Exercise Vital Sign    Days of Exercise per Week: 4 days    Minutes of Exercise per Session: 30 min  Stress: No Stress Concern Present (06/20/2023)   [employer] of Occupational Health - Occupational Stress Questionnaire    Feeling of Stress : Not at all  Social Connections: Socially Integrated (06/20/2023)   Social Connection and Isolation Panel    Frequency of Communication with Friends and Family: More than three times a week    Frequency of Social Gatherings with Friends and Family: More than three times a week    Attends Religious Services: More than 4 times per year    Active Member of [employer] or Organizations: Yes    Attends Engineer, structural: More than 4 times per year    Marital Status: Married  Catering manager Violence: Not At Risk (06/20/2023)   Humiliation, Afraid, Rape, and Kick questionnaire    Fear of Current or Ex-Partner: No    Emotionally Abused: No     Physically Abused: No    Sexually Abused: No    Outpatient Medications Prior to Visit  Medication Sig Dispense Refill   Ascorbic Acid (VITAMIN C) 100 MG tablet Take 100 mg by mouth daily.     Calcium-Magnesium 200-100 MG TABS Take 4 tablets by mouth daily. 2 tablets in the morning and 2 tablets in the evening     diazepam  (VALIUM ) 5 MG tablet Take 1/4-1/2 tablet po QHS  45 tablet 2   ergocalciferol  (DRISDOL) 200 MCG/ML drops      escitalopram  (LEXAPRO ) 10 MG tablet Take 1 tablet (10 mg total) by mouth daily. 90 tablet 0   Multiple Vitamin (MULTIVITAMIN) capsule Take 1 capsule by mouth daily.     Omega-3 1000 MG CAPS Take by mouth.     TURMERIC PO Take by mouth.     No facility-administered medications prior to visit.    Allergies  Allergen Reactions   Aricept  [Donepezil  Hcl] Other (See Comments)    Nightmares, worsened mental health   Doxycycline Swelling   Tetanus Toxoids Other (See Comments)    PAIN IN NECK AND FACE   Flexeril [Cyclobenzaprine] Other (See Comments)    Review of Systems  Constitutional:  Negative for fever and malaise/fatigue.  HENT:  Negative for congestion.   Eyes:  Negative for blurred vision.  Respiratory:  Negative for shortness of breath.   Cardiovascular:  Negative for chest pain, palpitations and leg swelling.  Gastrointestinal:  Negative for abdominal pain, blood in stool and nausea.  Genitourinary:  Negative for dysuria and frequency.  Musculoskeletal:  Positive for back pain, falls, joint pain and myalgias.  Skin:  Negative for rash.  Neurological:  Positive for headaches. Negative for dizziness and loss of consciousness.  Endo/Heme/Allergies:  Negative for environmental allergies.  Psychiatric/Behavioral:  Positive for memory loss. Negative for depression. The patient is not nervous/anxious.        Objective:    Physical Exam Constitutional:      General: She is not in acute distress.    Appearance: Normal appearance. She is  well-developed. She is not toxic-appearing.  HENT:     Head: Normocephalic and atraumatic.     Right Ear: External ear normal.     Left Ear: External ear normal.     Nose: Nose normal.   Eyes:     General:        Right eye: No discharge.        Left eye: No discharge.     Conjunctiva/sclera: Conjunctivae normal.   Neck:     Thyroid : No thyromegaly.   Cardiovascular:     Rate and Rhythm: Normal rate and regular rhythm.     Heart sounds: Normal heart sounds. No murmur heard. Pulmonary:     Effort: Pulmonary effort is normal. No respiratory distress.     Breath sounds: Normal breath sounds.  Abdominal:     General: Bowel sounds are normal.     Palpations: Abdomen is soft.     Tenderness: There is no abdominal tenderness. There is no guarding.   Musculoskeletal:        General: Normal range of motion.     Cervical back: Neck supple.  Lymphadenopathy:     Cervical: No cervical adenopathy.   Skin:    General: Skin is warm and dry.   Neurological:     Mental Status: She is alert and oriented to person, place, and time.   Psychiatric:        Mood and Affect: Mood normal.        Behavior: Behavior normal.        Thought Content: Thought content normal.        Judgment: Judgment normal.     BP 118/66   Pulse 88   Resp 16   Ht 5' 5 (1.651 m)   Wt 125 lb 9.6 oz (57 kg)   SpO2 97%   BMI 20.90 kg/m  Wt Readings  from Last 3 Encounters:  09/28/23 125 lb 9.6 oz (57 kg)  09/11/23 127 lb (57.6 kg)  06/20/23 129 lb (58.5 kg)    Diabetic Foot Exam - Simple   No data filed    Lab Results  Component Value Date   WBC 4.1 11/28/2022   HGB 12.8 11/28/2022   HCT 39.7 11/28/2022   PLT 254.0 11/28/2022   GLUCOSE 86 11/28/2022   CHOL 222 (H) 11/28/2022   TRIG 107.0 11/28/2022   HDL 66.30 11/28/2022   LDLCALC 134 (H) 11/28/2022   ALT 19 11/28/2022   AST 24 11/28/2022   NA 140 11/28/2022   K 4.1 11/28/2022   CL 106 11/28/2022   CREATININE 0.96 11/28/2022   BUN 16  11/28/2022   CO2 24 11/28/2022   TSH 1.95 11/28/2022    Lab Results  Component Value Date   TSH 1.95 11/28/2022   Lab Results  Component Value Date   WBC 4.1 11/28/2022   HGB 12.8 11/28/2022   HCT 39.7 11/28/2022   MCV 94.9 11/28/2022   PLT 254.0 11/28/2022   Lab Results  Component Value Date   NA 140 11/28/2022   K 4.1 11/28/2022   CO2 24 11/28/2022   GLUCOSE 86 11/28/2022   BUN 16 11/28/2022   CREATININE 0.96 11/28/2022   BILITOT 0.6 11/28/2022   ALKPHOS 62 11/28/2022   AST 24 11/28/2022   ALT 19 11/28/2022   PROT 6.5 11/28/2022   ALBUMIN 4.2 11/28/2022   CALCIUM 9.3 11/28/2022   GFR 57.68 (L) 11/28/2022   Lab Results  Component Value Date   CHOL 222 (H) 11/28/2022   Lab Results  Component Value Date   HDL 66.30 11/28/2022   Lab Results  Component Value Date   LDLCALC 134 (H) 11/28/2022   Lab Results  Component Value Date   TRIG 107.0 11/28/2022   Lab Results  Component Value Date   CHOLHDL 3 11/28/2022   No results found for: HGBA1C     Assessment & Plan:  Vitamin D  deficiency Assessment & Plan: Supplement and monitor   Orders: -     VITAMIN D  25 Hydroxy (Vit-D Deficiency, Fractures)  Upper airway cough syndrome Assessment & Plan: Hydrate and monitor    Mixed hyperlipidemia Assessment & Plan: Encourage heart healthy diet such as MIND or DASH diet, increase exercise, avoid trans fats, simple carbohydrates and processed foods, consider a krill or fish or flaxseed oil cap daily.    Orders: -     CBC with Differential/Platelet -     Comprehensive metabolic panel with GFR -     TSH -     Lipid panel  Iron deficiency anemia, unspecified iron deficiency anemia type Assessment & Plan: Increase leafy greens, consider increased lean red meat and using cast iron cookware. Continue to monitor, report any concerns   Orders: -     VITAMIN D  25 Hydroxy (Vit-D Deficiency, Fractures) -     Ambulatory referral to Gastroenterology  Falls -      Ambulatory referral to Physical Therapy  Right-sided face pain -     Ambulatory referral to Physical Therapy  Right leg pain -     Ambulatory referral to Physical Therapy    Assessment and Plan Assessment & Plan Hemorrhoids Persistent external hemorrhoids unresponsive to manual reduction, causing discomfort and itching. - Refer to Dr. Kavitha Nandegam at [facility] for evaluation and potential hemorrhoidectomy. - Continue Tux medicated pads and ointment for symptomatic relief.  Right-sided pain post-fall  Persistent right-sided pain affecting daily function, no symptom worsening. - Refer to physical therapy for evaluation and treatment. - Consider sports medicine referral if no improvement with physical therapy. - Advise alternating Tylenol and Advil for pain management, caution against excessive Advil use.  Severe reaction to mosquito bites Severe discomfort and itching from mosquito bites. - Recommend lemon eucalyptus as a natural mosquito repellent.  General Health Maintenance Inconsistent vitamin D  and multivitamin intake. - Encourage consistent intake of vitamin D  and multivitamins.     Randie Bustle, MD

## 2023-10-11 DIAGNOSIS — R413 Other amnesia: Secondary | ICD-10-CM | POA: Diagnosis not present

## 2023-10-17 ENCOUNTER — Encounter: Payer: Self-pay | Admitting: Family Medicine

## 2023-10-19 ENCOUNTER — Encounter: Payer: Self-pay | Admitting: Physician Assistant

## 2023-10-20 ENCOUNTER — Other Ambulatory Visit: Payer: Self-pay | Admitting: Family Medicine

## 2023-10-20 ENCOUNTER — Ambulatory Visit (INDEPENDENT_AMBULATORY_CARE_PROVIDER_SITE_OTHER): Admitting: Physician Assistant

## 2023-10-20 ENCOUNTER — Encounter: Payer: Self-pay | Admitting: Physician Assistant

## 2023-10-20 DIAGNOSIS — G4700 Insomnia, unspecified: Secondary | ICD-10-CM

## 2023-10-20 DIAGNOSIS — M542 Cervicalgia: Secondary | ICD-10-CM

## 2023-10-20 DIAGNOSIS — F4310 Post-traumatic stress disorder, unspecified: Secondary | ICD-10-CM | POA: Diagnosis not present

## 2023-10-20 DIAGNOSIS — F32A Depression, unspecified: Secondary | ICD-10-CM

## 2023-10-20 DIAGNOSIS — M545 Low back pain, unspecified: Secondary | ICD-10-CM

## 2023-10-20 MED ORDER — ESCITALOPRAM OXALATE 10 MG PO TABS: 10.0000 mg | ORAL_TABLET | Freq: Every day | ORAL | 0 refills | Status: DC

## 2023-10-20 MED ORDER — DIAZEPAM 5 MG PO TABS: ORAL_TABLET | ORAL | 2 refills | Status: AC

## 2023-10-20 NOTE — Progress Notes (Signed)
 [facility]  Patient ID: Dawn Simmons,  MRN: [id number]  PCP: Domenica Harlene LABOR, MD  Date of Evaluation: 10/20/2023 Time spent:30 minutes  Chief Complaint:  Chief Complaint   Anxiety; Depression; Follow-up    HISTORY/CURRENT STATUS: HPI Transfer from [name], NP, who is no longer with the practice.  Trouble sleeping without the Valium . States she never slept well all her life. Only thing that helps is Valium .  Doesn't take it during the day. Has trouble falling asleep and staying asleep.  No extreme sadness, tearfulness, or feelings of hopelessness. ADLs and personal hygiene are normal.   Still very worried about her memory.  Saw Neuro 10/11/2023, Dr. Lirim Tonuzi, see note. Appetite has not changed.  Weight is stable.  Denies feeling nervous and/or restless. No sense of impending doom.  No tachycardia, palpitations, SOB, sweating, or trembling.   No mania, delirium, AH/VH.  No SI/HI.  Denies dizziness, syncope, seizures, numbness, tingling, tremor, tics, unsteady gait, slurred speech, confusion. Denies muscle or joint pain, stiffness, or dystonia.  Individual Medical History/ Review of Systems: Changes? :No   Past Psychiatric Medication Trials: (She reports that she is very sensitive to medication) Diazepam - Prescribed as need with limited improvement.  Prozac - reports that she has to take Prozac  20 mg in divided doses.  Lexapro  Aricept - Nightmares, worsening anxiety    Allergies: Aricept  [donepezil  hcl], Doxycycline, Tetanus toxoids, and Flexeril [cyclobenzaprine]  Current Medications:  Current Outpatient Medications:    Ascorbic Acid (VITAMIN C) 100 MG tablet, Take 100 mg by mouth daily., Disp: , Rfl:    Calcium-Magnesium 200-100 MG TABS, Take 4 tablets by mouth daily. 2 tablets in the morning and 2 tablets in the evening, Disp: , Rfl:    ergocalciferol  (DRISDOL) 200 MCG/ML drops, , Disp: , Rfl:    Multiple Vitamin (MULTIVITAMIN) capsule, Take 1 capsule by  mouth daily., Disp: , Rfl:    Omega-3 1000 MG CAPS, Take by mouth., Disp: , Rfl:    TURMERIC PO, Take by mouth. (Patient not taking: Reported on 10/25/2023), Disp: , Rfl:    diazepam  (VALIUM ) 5 MG tablet, Take 1/4-1/2 tablet po QHS, Disp: 45 tablet, Rfl: 2   escitalopram  (LEXAPRO ) 10 MG tablet, Take 1 tablet (10 mg total) by mouth daily., Disp: 90 tablet, Rfl: 0 Medication Side Effects: none  Family Medical/ Social History: Changes? No  MENTAL HEALTH EXAM:  There were no vitals taken for this visit.There is no height or weight on file to calculate BMI.  General Appearance: Casual and Well Groomed  Eye Contact:  Good  Speech:  Clear and Coherent and Normal Rate  Volume:  Normal  Mood:  Euthymic  Affect:  Congruent  Thought Process:  Disorganized, Irrelevant, and Descriptions of Associations: Tangential  Orientation:  Full (Time, Place, and Person)  Thought Content: Rumination   Suicidal Thoughts:  No  Homicidal Thoughts:  No  Memory:  WNL  Judgement:  Fair  Insight:  Lacking  Psychomotor Activity:  Normal  Concentration:  Concentration: Fair and Attention Span: Fair  Recall:  Good  Fund of Knowledge: Good  Language: Good  Assets:  Desire for Improvement Financial Resources/Insurance Housing Transportation Vocational/Educational  ADL's:  Intact  Cognition: WNL  Prognosis:  Good   Reviewed notes from Neuro 10/11/2023  DIAGNOSES:    ICD-10-CM   1. Depression, unspecified depression type  F32.A escitalopram  (LEXAPRO ) 10 MG tablet    2. PTSD (post-traumatic stress disorder)  F43.10 diazepam  (VALIUM ) 5 MG tablet    escitalopram  (  LEXAPRO ) 10 MG tablet    3. Insomnia, unspecified type  G47.00 diazepam  (VALIUM ) 5 MG tablet     Receiving Psychotherapy: No   RECOMMENDATIONS:   PDMP reviewed.  Valium  filled 07/01/2023. I provided approximately 30  minutes of face to face time during this encounter, including time spent before and after the visit in records review, medical  decision making, counseling pertinent to today's visit, and charting.   I reviewed the recent note from Neuro, labs, and imaging studies on chart.   As far as her mental health meds are concerned, she's stable and not changes will be made. I'd like to get her off the Valium  b/c it may be affecting the memory however, she's been on it for years, only takes at night and it would be very difficult to get her off.  She does not want to decrease it or stop it despite the possible worsening of memory.    Continue Valium  5 mg, 1/4-1/2 at bedtime prn sleep.  Continue Lexapro  10 mg, 1 every day.  Continue vitamins and supplements per med list. Return in 3-4 months.   Verneita Cooks, PA-C

## 2023-10-20 NOTE — Telephone Encounter (Unsigned)
 Copied from CRM [id number]. Topic: Referral - Status >> Oct 20, 2023 12:37 PM Geroldine GRADE wrote: Reason for CRM: calling to get the status of the referral for physical therapy and to make sure it was sent to the [facility] location

## 2023-10-25 ENCOUNTER — Other Ambulatory Visit: Payer: Self-pay

## 2023-10-25 ENCOUNTER — Encounter: Payer: Self-pay | Admitting: Rehabilitative and Restorative Service Providers"

## 2023-10-25 ENCOUNTER — Ambulatory Visit: Attending: Family Medicine | Admitting: Rehabilitative and Restorative Service Providers"

## 2023-10-25 DIAGNOSIS — M545 Low back pain, unspecified: Secondary | ICD-10-CM | POA: Diagnosis not present

## 2023-10-25 DIAGNOSIS — R252 Cramp and spasm: Secondary | ICD-10-CM | POA: Insufficient documentation

## 2023-10-25 DIAGNOSIS — M5459 Other low back pain: Secondary | ICD-10-CM | POA: Diagnosis not present

## 2023-10-25 DIAGNOSIS — M542 Cervicalgia: Secondary | ICD-10-CM | POA: Insufficient documentation

## 2023-10-25 DIAGNOSIS — R2689 Other abnormalities of gait and mobility: Secondary | ICD-10-CM | POA: Diagnosis not present

## 2023-10-25 DIAGNOSIS — M6281 Muscle weakness (generalized): Secondary | ICD-10-CM | POA: Insufficient documentation

## 2023-10-25 NOTE — Therapy (Signed)
 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   Benzodiazepine withdrawal without complication    Cervical radiculopathy at C6    Cheilitis    Chronic fatigue    Chronic obstructive pulmonary disease 09/07/2018   Quit smoking 1974 with copd changes on cxr but no limiting sob as of 09/06/2018  - 09/06/2018   Walked RA  2 laps @  approx 251ft each @ fast pace  stopped due to  End of study, no sob and sats  100% at end   Fibromyalgia 06/01/2015   History of chicken pox 09/15/2019   History of COVID-19 02/05/2020   History of measles    History of mumps    History of rubella    History of syncope    Insomnia 06/01/2015   Iron deficiency anemia    Left shoulder pain 10/31/2019   Leukopenia 09/15/2019   Low back pain 09/08/2021   Mitral valve prolapse    Mixed hyperlipidemia 11/04/2021   Neuritis    Palpitations    Pharyngitis    Psychogenic nonepileptic seizure    PTSD (post-traumatic stress disorder) 09/15/2019   Restless legs syndrome    Right sided weakness 01/10/2020   Upper airway cough syndrome 09/06/2018   Onset Nov 2019 with uri  - Sinus CT 56/12/2018 neg    Visual disturbance 11/02/2021   Vitamin D  deficiency    Von Willebrand disease    Past Surgical History:  Procedure Laterality Date   NO PAST SURGERIES     Patient Active Problem List   Diagnosis Date Noted    Neurocognitive disorder 02/01/2023   Mood disorder (HCC) 02/01/2023   Neck pain 10/10/2022   Memory loss 05/30/2022   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   Allergic rhinitis    Cervical radiculopathy at C6    Chronic fatigue    Psychogenic nonepileptic seizure    Iron deficiency anemia    Neuritis    Restless legs syndrome    Vitamin D  deficiency    Cheilitis    Mixed hyperlipidemia 11/04/2021   Visual disturbance 11/02/2021   Low back pain 09/08/2021   History of COVID-19 02/05/2020   Right sided weakness 01/10/2020   Left shoulder pain 10/31/2019   Leukopenia 09/15/2019   PTSD (post-traumatic stress disorder) 09/15/2019   History of chicken pox 09/15/2019   Von Willebrand disease    Chronic obstructive pulmonary disease 09/07/2018   Upper airway cough syndrome 09/06/2018   Fibromyalgia 06/01/2015   Insomnia 06/01/2015   History of syncope    Palpitations    Mitral valve prolapse     PCP: Domenica Harlene LABOR, MD  REFERRING PROVIDER: Domenica Harlene LABOR, MD  REFERRING DIAG: M54.2 (ICD-10-CM) - Neck pain M54.50 (ICD-10-CM) -  Low back pain, unspecified back pain laterality, unspecified chronicity, unspecified whether sciatica present  THERAPY DIAG:  Cervicalgia  Other low back pain  Cramp and spasm  Other abnormalities of gait and mobility  Muscle weakness (generalized)  Rationale for Evaluation and Treatment: Rehabilitation  ONSET DATE: 7-8 years ago, but pain worsened after fall earlier this year  SUBJECTIVE:                                                                                                                                                                                                         SUBJECTIVE STATEMENT: Patient reports that she has had pain for at least 7-8 years, intermittently.  She does state that she has had syncope in the past, but unrelated to fall 2-3 months ago. Pt reports being outside recently,  doing yard work when it started raining, pt ran and slipped   Hand dominance: Right Patient's husband, Koren, present throughout  PERTINENT HISTORY:  prodromal Alzheimer's, Fibromyalgia, Cervical Radiculopathy, Anxiety, depression, PTSD, insomnia  PAIN:  Are you having pain? Yes: NPRS scale: 10/10 time of incident; currently 5/10 Pain location: Rt side body Pain description: Sharp, achy Aggravating factors: First waking in the morning Relieving factors: Unknown  PRECAUTIONS: None  RED FLAGS: None     WEIGHT BEARING RESTRICTIONS: No  FALLS:  Has patient fallen in last 6 months? Yes. Number of falls 1 where she slipped outside on wet leaves  LIVING ENVIRONMENT: Lives with: lives with their spouse Lives in: House/apartment Stairs: 2 story home Has following equipment at home: None  OCCUPATION: Retired   PLOF: Independent and Leisure: Gardening, being outside, yard work   PATIENT GOALS: Pt would like to be able to get back to doing yard work  NEXT MD VISIT: Every 6 months  OBJECTIVE:  Note: Objective measures were completed at Evaluation unless otherwise noted.  DIAGNOSTIC FINDINGS:  Lumbar radiograph on 09/10/2021 IMPRESSION: 1. Mild broad-based dextroscoliotic curvature. 2. Mild L4-L5 disc space narrowing. 3. Minor L5-S1 facet hypertrophy.  PATIENT SURVEYS:  LEFS  Extreme difficulty/unable (0), Quite a bit of difficulty (1), Moderate difficulty (2), Little difficulty (3), No difficulty (4) Survey date:  10/25/23  Any of your usual work, housework or school activities 2  2. Usual hobbies, recreational or sporting activities 1  3. Getting into/out of the bath 2  4. Walking between rooms 3  5. Putting on socks/shoes 3  6. Squatting  2  7. Lifting an object, like a bag of groceries from the floor 1  8. Performing light activities around your home  3  9. Performing heavy activities around your home 0  10. Getting into/out of a car 3  11. Walking 2 blocks 3  12.  Walking 1 mile 0  13. Going up/down 10 stairs (1 flight) 2  14. Standing for 1 hour 0  15.  sitting for 1 hour 3  16. Running on even ground 0  17. Running on uneven ground 0  18. Making sharp turns while running fast 0  19. Hopping  0  20. Rolling over in bed 3  Score total:  31/80 = 38.75%     COGNITION: Overall cognitive status: History of cognitive impairments - at baseline  SENSATION: Pt reports tingling in hands  POSTURE: rounded shoulders and forward head  PALPATION: Tightness in bilateral upper traps and lumbar paraspinals.  Crepitus noted in Rt scapula  CERVICAL ROM:   Active ROM *=pain A/ROM (deg) eval  Flexion 50*  Extension 45*  Right lateral flexion 25*  Left lateral flexion 30*  Right rotation 52*  Left rotation 55*   (Blank rows = not tested)  UPPER EXTREMITY ROM: Eval: WNL with some pain at end ROM on Rt shoulder    LOWER/UPPER EXTREMITY MMT:  Eval:   Right shoulder strength grossly 4-to 4/5 Left shoulder flexion is 5-/5, left shoulder abduction is 4/5 Bilateral LE strength of 4- to 4/5 grossly throughout   FUNCTIONAL TESTS:  Eval: 5 times sit to stand: 30.77 sec with hands pressing up on thighs, with pain  Timed up and go (TUG): 14.69 sec   TREATMENT DATE:  10/25/2023: Reviewed the role of PT Reviewed HEP                                                                                                                                 PATIENT EDUCATION:  Education details: Issued HEP Person educated: Patient and Spouse Education method: Explanation, Demonstration, and Handouts Education comprehension: verbalized understanding and returned demonstration  HOME EXERCISE PROGRAM: Access Code: [email or web address] URL: [email or web address] Date: 10/25/2023 Prepared by: Jarrell Arval Brandstetter  Exercises - Seated Scapular Retraction  - 1 x daily - 7 x weekly - 2 sets - 10 reps - Seated Transversus Abdominis Bracing  - 1 x daily - 7 x weekly - 2  sets - 10 reps - Seated Hamstring Stretch  - 1 x daily - 7 x weekly - 3 sets - 30 sec hold - Sit to Stand  - 1 x daily - 7 x weekly - 3 sets - 5 reps  ASSESSMENT:  CLINICAL IMPRESSION: Patient is a 78 y.o. female who was seen today for physical therapy evaluation and treatment for neck and back pain. Patient states that she has a history of syncope in the past, but has not experienced that recently.  Patient does admit to one fall a couple of months ago, which is when the pain increased along the right side of her body.  Patient states that she continues  to have pain throughout the right side of her body.  Patient is, at times, a poor historian and her husband, Koren, is able to provide clarifying information.  Patient presents to PT with increased pain, decreased cervical ROM, muscle weakness, difficulty walking, and decreased ability to perform desired tasks without pain.  Patient would benefit from skilled PT to address her functional impairments to decrease her risk of falling and allow her to progress with decreased pain with functional tasks.  OBJECTIVE IMPAIRMENTS: Abnormal gait, decreased balance, decreased cognition, decreased coordination, difficulty walking, decreased strength, increased muscle spasms, impaired flexibility, postural dysfunction, and pain.   ACTIVITY LIMITATIONS: carrying, lifting, bending, sitting, standing, squatting, and stairs  PARTICIPATION LIMITATIONS: community activity and yard work  PERSONAL FACTORS: Time since onset of injury/illness/exacerbation and 3+ comorbidities: Alzheimer's, Fibromyalgia, Cervical radiculopathy are also affecting patient's functional outcome.   REHAB POTENTIAL: Good  CLINICAL DECISION MAKING: Evolving/moderate complexity  EVALUATION COMPLEXITY: Moderate   GOALS: Goals reviewed with patient? Yes  SHORT TERM GOALS: Target date: 11/17/2023  Patient and spouse will be independent with initial HEP. Baseline:  Goal status:  INITIAL  2.  Patient will be able to participate in 3/6 minute walk test to establish a baseline measurement. Baseline:  Goal status: INITIAL   LONG TERM GOALS: Target date: 12/15/2023  Patient and spouse will be independent with advanced HEP. Baseline:  Goal status: INITIAL  2.  Patient will be able to ambulate for at least 10-15 min without increased pain to allow for community reintegration. Baseline:  Goal status: INITIAL  3.  Patient will improve 5 times sit to/from stand to 20 sec or less to demonstrate improved functional strength. Baseline: 30.77 sec Goal status: INITIAL  4.  Patient to report ability to being able to perform her gardening and desired activities in the yard without increased pain. Baseline:  Goal status: INITIAL  5.  Patient to report no new falls within 2 weeks of discharge. Baseline:  Goal status: INITIAL    PLAN:  PT FREQUENCY: 1-2x/week  PT DURATION: 8 weeks  PLANNED INTERVENTIONS: 97164- PT Re-evaluation, 97750- Physical Performance Testing, 97110-Therapeutic exercises, 97530- Therapeutic activity, 97112- Neuromuscular re-education, 97535- Self Care, 02859- Manual therapy, [phone]- Gait training, [phone]- Canalith repositioning, [id number]- Aquatic Therapy, [phone]- Electrical stimulation (unattended), [phone]- Electrical stimulation (manual), [id number]- Vasopneumatic device, [id number]- Ultrasound, [id number]- Traction (mechanical), [id number]- Ionotophoresis 4mg /ml Dexamethasone, 79439 (1-2 muscles), 20561 (3+ muscles)- Dry Needling, Patient/Family education, Balance training, Stair training, Taping, Joint mobilization, Joint manipulation, Spinal manipulation, Spinal mobilization, Cryotherapy, and Moist heat  PLAN FOR NEXT SESSION: Assess and progress HEP as indicated, strengthening, flexibility, manual/dry needling as indicated    Jarrell Laming, PT, DPT 10/25/23, 12:45 PM  [facility] [address] [place], [place] [address] Phone #  [phone] Fax [phone]

## 2023-10-26 ENCOUNTER — Ambulatory Visit: Admitting: Physical Therapy

## 2023-10-26 ENCOUNTER — Encounter: Payer: Self-pay | Admitting: Physical Therapy

## 2023-10-26 DIAGNOSIS — M5459 Other low back pain: Secondary | ICD-10-CM | POA: Diagnosis not present

## 2023-10-26 DIAGNOSIS — R252 Cramp and spasm: Secondary | ICD-10-CM | POA: Diagnosis not present

## 2023-10-26 DIAGNOSIS — M6281 Muscle weakness (generalized): Secondary | ICD-10-CM

## 2023-10-26 DIAGNOSIS — R2689 Other abnormalities of gait and mobility: Secondary | ICD-10-CM

## 2023-10-26 DIAGNOSIS — M545 Low back pain, unspecified: Secondary | ICD-10-CM | POA: Diagnosis not present

## 2023-10-26 DIAGNOSIS — M542 Cervicalgia: Secondary | ICD-10-CM | POA: Diagnosis not present

## 2023-10-26 NOTE — Therapy (Signed)
 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   Benzodiazepine withdrawal without complication    Cervical radiculopathy at C6    Cheilitis    Chronic fatigue    Chronic obstructive pulmonary disease 09/07/2018   Quit smoking 1974 with copd changes on cxr but no limiting sob as of 09/06/2018  - 09/06/2018   Walked RA  2 laps @  approx 266ft each @ fast pace  stopped due to  End of study, no sob and sats  100% at end   Fibromyalgia 06/01/2015   History of chicken pox 09/15/2019   History of COVID-19 02/05/2020   History of measles    History of mumps    History of rubella    History of syncope    Insomnia 06/01/2015   Iron deficiency anemia    Left shoulder pain 10/31/2019   Leukopenia 09/15/2019   Low back pain 09/08/2021   Mitral valve prolapse    Mixed hyperlipidemia 11/04/2021   Neuritis    Palpitations    Pharyngitis    Psychogenic nonepileptic seizure    PTSD (post-traumatic stress disorder) 09/15/2019   Restless legs syndrome    Right sided weakness 01/10/2020   Upper airway cough syndrome 09/06/2018   Onset Nov 2019 with uri  - Sinus CT 56/12/2018 neg    Visual disturbance 11/02/2021   Vitamin D  deficiency    Von Willebrand disease    Past Surgical History:  Procedure Laterality Date   NO PAST SURGERIES     Patient Active Problem List   Diagnosis Date Noted    Neurocognitive disorder 02/01/2023   Mood disorder (HCC) 02/01/2023   Neck pain 10/10/2022   Memory loss 05/30/2022   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   Allergic rhinitis    Cervical radiculopathy at C6    Chronic fatigue    Psychogenic nonepileptic seizure    Iron deficiency anemia    Neuritis    Restless legs syndrome    Vitamin D  deficiency    Cheilitis    Mixed hyperlipidemia 11/04/2021   Visual disturbance 11/02/2021   Low back pain 09/08/2021   History of COVID-19 02/05/2020   Right sided weakness 01/10/2020   Left shoulder pain 10/31/2019   Leukopenia 09/15/2019   PTSD (post-traumatic stress disorder) 09/15/2019   History of chicken pox 09/15/2019   Von Willebrand disease    Chronic obstructive pulmonary disease 09/07/2018   Upper airway cough syndrome 09/06/2018   Fibromyalgia 06/01/2015   Insomnia 06/01/2015   History of syncope    Palpitations    Mitral valve prolapse     PCP: Domenica Harlene LABOR, MD  REFERRING PROVIDER: Domenica Harlene LABOR, MD  REFERRING DIAG: M54.2 (ICD-10-CM) - Neck pain M54.50 (ICD-10-CM) -  Low back pain, unspecified back pain laterality, unspecified chronicity, unspecified whether sciatica present  THERAPY DIAG:  Cervicalgia  Other low back pain  Cramp and spasm  Other abnormalities of gait and mobility  Muscle weakness (generalized)  Rationale for Evaluation and Treatment: Rehabilitation  ONSET DATE: 7-8 years ago, but pain worsened after fall earlier this year  SUBJECTIVE:                                                                                                                                                                                                         SUBJECTIVE STATEMENT: Patient reports she is a little sore from the exercising yesterday,   From Eval: Patient reports that she has had pain for at least 7-8 years, intermittently.  She does state that she has had syncope in the  past, but unrelated to fall 2-3 months ago. Pt reports being outside recently, doing yard work when it started raining, pt ran and slipped   Hand dominance: Right Patient's husband, Koren, present throughout  PERTINENT HISTORY:  prodromal Alzheimer's, Fibromyalgia, Cervical Radiculopathy, Anxiety, depression, PTSD, insomnia  PAIN:  Are you having pain? Yes: NPRS scale: 10/10 time of incident; currently 5/10 Pain location: Rt side body Pain description: Sharp, achy Aggravating factors: First waking in the morning Relieving factors: Unknown  PRECAUTIONS: None  RED FLAGS: None     WEIGHT BEARING RESTRICTIONS: No  FALLS:  Has patient fallen in last 6 months? Yes. Number of falls 1 where she slipped outside on wet leaves  LIVING ENVIRONMENT: Lives with: lives with their spouse Lives in: House/apartment Stairs: 2 story home Has following equipment at home: None  OCCUPATION: Retired   PLOF: Independent and Leisure: Gardening, being outside, yard work   PATIENT GOALS: Pt would like to be able to get back to doing yard work  NEXT MD VISIT: Every 6 months  OBJECTIVE:  Note: Objective measures were completed at Evaluation unless otherwise noted.  DIAGNOSTIC FINDINGS:  Lumbar radiograph on 09/10/2021 IMPRESSION: 1. Mild broad-based dextroscoliotic curvature. 2. Mild L4-L5 disc space narrowing. 3. Minor L5-S1 facet hypertrophy.  PATIENT SURVEYS:  LEFS  Extreme difficulty/unable (0), Quite a bit of difficulty (1), Moderate difficulty (2), Little difficulty (3), No difficulty (4) Survey date:  10/25/23  Any of your usual work, housework or school activities 2  2. Usual hobbies, recreational or sporting activities 1  3. Getting into/out of the bath 2  4. Walking between rooms 3  5. Putting on socks/shoes 3  6. Squatting  2  7. Lifting an object, like a  bag of groceries from the floor 1  8. Performing light activities around your home 3  9. Performing heavy activities around  your home 0  10. Getting into/out of a car 3  11. Walking 2 blocks 3  12. Walking 1 mile 0  13. Going up/down 10 stairs (1 flight) 2  14. Standing for 1 hour 0  15.  sitting for 1 hour 3  16. Running on even ground 0  17. Running on uneven ground 0  18. Making sharp turns while running fast 0  19. Hopping  0  20. Rolling over in bed 3  Score total:  31/80 = 38.75%     COGNITION: Overall cognitive status: History of cognitive impairments - at baseline  SENSATION: Pt reports tingling in hands  POSTURE: rounded shoulders and forward head  PALPATION: Tightness in bilateral upper traps and lumbar paraspinals.  Crepitus noted in Rt scapula  CERVICAL ROM:   Active ROM *=pain A/ROM (deg) eval  Flexion 50*  Extension 45*  Right lateral flexion 25*  Left lateral flexion 30*  Right rotation 52*  Left rotation 55*   (Blank rows = not tested)  UPPER EXTREMITY ROM: Eval: WNL with some pain at end ROM on Rt shoulder    LOWER/UPPER EXTREMITY MMT:  Eval:   Right shoulder strength grossly 4-to 4/5 Left shoulder flexion is 5-/5, left shoulder abduction is 4/5 Bilateral LE strength of 4- to 4/5 grossly throughout   FUNCTIONAL TESTS:  Eval: 5 times sit to stand: 30.77 sec with hands pressing up on thighs, with pain  Timed up and go (TUG): 14.69 sec   TREATMENT DATE:  10/26/2023 NuStep Level 3 7 mins- PT present to discuss status Seated scapular retraction  x 10 Seated TA activation with ball squeeze 2 x 10 Hamstring stretch at stair 2 x 30 sec bilateral  Sit to stand 2 x 5 Standing shoulder row & extension with red TB 2 x 10 Standing shoulder flexion & scaption 2# x 10 each Seated LAQ 2# x 10 bilateral  Seated stability ball stretch (forwards) x 10   10/25/2023: Reviewed the role of PT ReviewedHEP                                                                                                                                 PATIENT EDUCATION:  Education details:  Issued HEP Person educated: Patient and Spouse Education method: Explanation, Demonstration, and Handouts Education comprehension: verbalized understanding and returned demonstration  HOME EXERCISE PROGRAM: Access Code: [email or web address] URL: [email or web address] Date: 10/25/2023 Prepared by: Jarrell Laming  Exercises - Seated Scapular Retraction  - 1 x daily - 7 x weekly - 2 sets - 10 reps - Seated Transversus Abdominis Bracing  - 1 x daily - 7 x weekly - 2 sets - 10 reps - Seated Hamstring Stretch  - 1 x daily - 7 x weekly - 3 sets - 30 sec hold - Sit  to Stand  - 1 x daily - 7 x weekly - 3 sets - 5 reps  ASSESSMENT:  CLINICAL IMPRESSION: Camri presents to first follow up appointment since evaluation. She has been compliant with HEP and verbalized some soreness after performing exercises. Patient verbalized some back pain when performing seated hamstring stretch so opted to perform stretch standing at the stair. At times patient stands and sits with a posterior pelvic tilt and rounded shoulders. PT provided frequent verbal cues for core engagement and correct posture. During session patient frequently complained of right wrist pain. She had tenderness with palpation and testing of thumb abduction and extension. Overall, she tolerated treatment session well and should progress well with therapy.  OBJECTIVE IMPAIRMENTS: Abnormal gait, decreased balance, decreased cognition, decreased coordination, difficulty walking, decreased strength, increased muscle spasms, impaired flexibility, postural dysfunction, and pain.   ACTIVITY LIMITATIONS: carrying, lifting, bending, sitting, standing, squatting, and stairs  PARTICIPATION LIMITATIONS: community activity and yard work  PERSONAL FACTORS: Time since onset of injury/illness/exacerbation and 3+ comorbidities: Alzheimer's, Fibromyalgia, Cervical radiculopathy are also affecting patient's functional outcome.   REHAB POTENTIAL:  Good  CLINICAL DECISION MAKING: Evolving/moderate complexity  EVALUATION COMPLEXITY: Moderate   GOALS: Goals reviewed with patient? Yes  SHORT TERM GOALS: Target date: 11/17/2023  Patient and spouse will be independent with initial HEP. Baseline:  Goal status: INITIAL  2.  Patient will be able to participate in 3/6 minute walk test to establish a baseline measurement. Baseline:  Goal status: INITIAL   LONG TERM GOALS: Target date: 12/15/2023  Patient and spouse will be independent with advanced HEP. Baseline:  Goal status: INITIAL  2.  Patient will be able to ambulate for at least 10-15 min without increased pain to allow for community reintegration. Baseline:  Goal status: INITIAL  3.  Patient will improve 5 times sit to/from stand to 20 sec or less to demonstrate improved functional strength. Baseline: 30.77 sec Goal status: INITIAL  4.  Patient to report ability to being able to perform her gardening and desired activities in the yard without increased pain. Baseline:  Goal status: INITIAL  5.  Patient to report no new falls within 2 weeks of discharge. Baseline:  Goal status: INITIAL    PLAN:  PT FREQUENCY: 1-2x/week  PT DURATION: 8 weeks  PLANNED INTERVENTIONS: 97164- PT Re-evaluation, 97750- Physical Performance Testing, 97110-Therapeutic exercises, 97530- Therapeutic activity, 97112- Neuromuscular re-education, 97535- Self Care, 02859- Manual therapy, [phone]- Gait training, [phone]- Canalith repositioning, [id number]- Aquatic Therapy, [phone]- Electrical stimulation (unattended), [phone]- Electrical stimulation (manual), [id number]- Vasopneumatic device, [id number]- Ultrasound, [id number]- Traction (mechanical), [id number]- Ionotophoresis 4mg /ml Dexamethasone, 79439 (1-2 muscles), 20561 (3+ muscles)- Dry Needling, Patient/Family education, Balance training, Stair training, Taping, Joint mobilization, Joint manipulation, Spinal manipulation, Spinal mobilization, Cryotherapy, and Moist  heat  PLAN FOR NEXT SESSION: assess response to treatment session; strengthening, flexibility, manual/dry needling as indicated     Kristeen Sar, PT 10/26/23 3:47 PM [facility] [address] [place], [place] [address] Phone # [phone] Fax [phone]

## 2023-10-29 ENCOUNTER — Encounter: Payer: Self-pay | Admitting: Physician Assistant

## 2023-11-03 ENCOUNTER — Ambulatory Visit: Payer: Self-pay

## 2023-11-03 NOTE — Telephone Encounter (Signed)
 FYI Only or Action Required?: FYI only for provider.  Patient was last seen in primary care on 09/28/2023 by Domenica Harlene LABOR, MD.  Called Nurse Triage reporting Flank Pain.  Symptoms began several weeks ago.  Interventions attempted: Rest, hydration, or home remedies.  Symptoms are: gradually worsening.  Triage Disposition: See Physician Within 24 Hours  Patient/caregiver understands and will follow disposition?: Yes, but will wait   Copied from CRM #[id number]. Topic: Clinical - Red Word Triage >> Nov 03, 2023  5:19 PM Deleta RAMAN wrote: Red Word that prompted transfer to Nurse Triage: patient had a fall about 2 months ago. Patient was referred to a physical therapist she still is experiencing pain and mentioned the pt appointment wasn't great. Patient states the pt appointment wore her out and she has not been the same since the appointment. Reason for Disposition  MODERATE pain (e.g., interferes with normal activities or awakens from sleep)    Persisting pain. Already evaluated. Requesting follow up  Answer Assessment - Initial Assessment Questions 1. LOCATION: Where does it hurt? (e.g., left, right)     Entire right side  2. ONSET: When did the pain start?     2 months 3. SEVERITY: How bad is the pain? (e.g., Scale 1-10; mild, moderate, or severe)     Severe 4. PATTERN: Does the pain come and go, or is it constant?      Constant 5. CAUSE: What do you think is causing the pain?     From fall two months 6. OTHER SYMPTOMS:  Do you have any other symptoms? (e.g., fever, abdomen pain, vomiting, leg weakness, burning with urination, blood in urine)     Denies   Additional info: Fall two months ago, went down on her right side from head to toe. She attended PT and feels worse. No longer interested in PT causing increased pain the next, she unable to tolerate it. She did all she was asked but feels this is not the best treatment option for her. Requesting follow up in  primary care.  Protocols used: Flank Pain-A-AH

## 2023-11-06 ENCOUNTER — Ambulatory Visit: Admitting: Family Medicine

## 2023-11-08 ENCOUNTER — Ambulatory Visit: Admitting: Physical Therapy

## 2023-11-08 ENCOUNTER — Encounter: Payer: Self-pay | Admitting: Physical Therapy

## 2023-11-08 DIAGNOSIS — R252 Cramp and spasm: Secondary | ICD-10-CM | POA: Diagnosis not present

## 2023-11-08 DIAGNOSIS — R2689 Other abnormalities of gait and mobility: Secondary | ICD-10-CM | POA: Diagnosis not present

## 2023-11-08 DIAGNOSIS — M5459 Other low back pain: Secondary | ICD-10-CM

## 2023-11-08 DIAGNOSIS — M545 Low back pain, unspecified: Secondary | ICD-10-CM | POA: Diagnosis not present

## 2023-11-08 DIAGNOSIS — M542 Cervicalgia: Secondary | ICD-10-CM

## 2023-11-08 DIAGNOSIS — M6281 Muscle weakness (generalized): Secondary | ICD-10-CM | POA: Diagnosis not present

## 2023-11-08 NOTE — Therapy (Signed)
 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   Benzodiazepine withdrawal without complication    Cervical radiculopathy at C6    Cheilitis    Chronic fatigue    Chronic obstructive pulmonary disease 09/07/2018   Quit smoking 1974 with copd changes on cxr but no limiting sob as of 09/06/2018  - 09/06/2018   Walked RA  2 laps @  approx 224ft each @ fast pace  stopped due to  End of study, no sob and sats  100% at end   Fibromyalgia 06/01/2015   History of chicken pox 09/15/2019   History of COVID-19 02/05/2020   History of measles    History of mumps    History of rubella    History of syncope    Insomnia 06/01/2015   Iron deficiency anemia    Left shoulder pain 10/31/2019   Leukopenia 09/15/2019   Low back pain 09/08/2021   Mitral valve prolapse    Mixed hyperlipidemia 11/04/2021   Neuritis    Palpitations    Pharyngitis    Psychogenic nonepileptic seizure    PTSD (post-traumatic stress disorder) 09/15/2019   Restless legs syndrome    Right sided weakness 01/10/2020   Upper airway cough syndrome 09/06/2018   Onset Nov 2019 with uri  - Sinus CT 56/12/2018 neg    Visual disturbance 11/02/2021   Vitamin D  deficiency    Von Willebrand disease    Past Surgical History:  Procedure Laterality Date   NO PAST SURGERIES     Patient Active Problem List   Diagnosis Date  Noted   Neurocognitive disorder 02/01/2023   Mood disorder (HCC) 02/01/2023   Neck pain 10/10/2022   Memory loss 05/30/2022   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   Allergic rhinitis    Cervical radiculopathy at C6    Chronic fatigue    Psychogenic nonepileptic seizure    Iron deficiency anemia    Neuritis    Restless legs syndrome    Vitamin D  deficiency    Cheilitis    Mixed hyperlipidemia 11/04/2021   Visual disturbance 11/02/2021   Low back pain 09/08/2021   History of COVID-19 02/05/2020   Right sided weakness 01/10/2020   Left shoulder pain 10/31/2019   Leukopenia 09/15/2019   PTSD (post-traumatic stress disorder) 09/15/2019   History of chicken pox 09/15/2019   Von Willebrand disease    Chronic obstructive pulmonary disease 09/07/2018   Upper airway cough syndrome 09/06/2018   Fibromyalgia 06/01/2015   Insomnia 06/01/2015   History of syncope    Palpitations    Mitral valve prolapse     PCP: Domenica Harlene LABOR, MD  REFERRING PROVIDER: Domenica Harlene LABOR, MD  REFERRING DIAG: M54.2 (ICD-10-CM) - Neck pain M54.50 (  ICD-10-CM) - Low back pain, unspecified back pain laterality, unspecified chronicity, unspecified whether sciatica present  THERAPY DIAG:  Cervicalgia  Other low back pain  Cramp and spasm  Other abnormalities of gait and mobility  Muscle weakness (generalized)  Rationale for Evaluation and Treatment: Rehabilitation  ONSET DATE: 7-8 years ago, but pain worsened after fall earlier this year  SUBJECTIVE:                                                                                                                                                                                                         SUBJECTIVE STATEMENT: Patient reports she is doing good today. She is not having any pain.   From Eval: Patient reports that she has had pain for at least 7-8 years, intermittently.  She does state that she has had syncope  in the past, but unrelated to fall 2-3 months ago. Pt reports being outside recently, doing yard work when it started raining, pt ran and slipped   Hand dominance: Right Patient's husband, Koren, present throughout  PERTINENT HISTORY:  prodromal Alzheimer's, Fibromyalgia, Cervical Radiculopathy, Anxiety, depression, PTSD, insomnia  PAIN:  Are you having pain? Yes: NPRS scale: 10/10 time of incident; currently 5/10 Pain location: Rt side body Pain description: Sharp, achy Aggravating factors: First waking in the morning Relieving factors: Unknown  PRECAUTIONS: None  RED FLAGS: None     WEIGHT BEARING RESTRICTIONS: No  FALLS:  Has patient fallen in last 6 months? Yes. Number of falls 1 where she slipped outside on wet leaves  LIVING ENVIRONMENT: Lives with: lives with their spouse Lives in: House/apartment Stairs: 2 story home Has following equipment at home: None  OCCUPATION: Retired   PLOF: Independent and Leisure: Gardening, being outside, yard work   PATIENT GOALS: Pt would like to be able to get back to doing yard work  NEXT MD VISIT: Every 6 months  OBJECTIVE:  Note: Objective measures were completed at Evaluation unless otherwise noted.  DIAGNOSTIC FINDINGS:  Lumbar radiograph on 09/10/2021 IMPRESSION: 1. Mild broad-based dextroscoliotic curvature. 2. Mild L4-L5 disc space narrowing. 3. Minor L5-S1 facet hypertrophy.  PATIENT SURVEYS:  LEFS  Extreme difficulty/unable (0), Quite a bit of difficulty (1), Moderate difficulty (2), Little difficulty (3), No difficulty (4) Survey date:  10/25/23  Any of your usual work, housework or school activities 2  2. Usual hobbies, recreational or sporting activities 1  3. Getting into/out of the bath 2  4. Walking between rooms 3  5. Putting on socks/shoes 3  6. Squatting  2  7. Lifting  an object, like a bag of groceries from the floor 1  8. Performing light activities around your home 3  9. Performing heavy activities  around your home 0  10. Getting into/out of a car 3  11. Walking 2 blocks 3  12. Walking 1 mile 0  13. Going up/down 10 stairs (1 flight) 2  14. Standing for 1 hour 0  15.  sitting for 1 hour 3  16. Running on even ground 0  17. Running on uneven ground 0  18. Making sharp turns while running fast 0  19. Hopping  0  20. Rolling over in bed 3  Score total:  31/80 = 38.75%     COGNITION: Overall cognitive status: History of cognitive impairments - at baseline  SENSATION: Pt reports tingling in hands  POSTURE: rounded shoulders and forward head  PALPATION: Tightness in bilateral upper traps and lumbar paraspinals.  Crepitus noted in Rt scapula  CERVICAL ROM:   Active ROM *=pain A/ROM (deg) eval  Flexion 50*  Extension 45*  Right lateral flexion 25*  Left lateral flexion 30*  Right rotation 52*  Left rotation 55*   (Blank rows = not tested)  UPPER EXTREMITY ROM: Eval: WNL with some pain at end ROM on Rt shoulder    LOWER/UPPER EXTREMITY MMT:  Eval:   Right shoulder strength grossly 4-to 4/5 Left shoulder flexion is 5-/5, left shoulder abduction is 4/5 Bilateral LE strength of 4- to 4/5 grossly throughout   FUNCTIONAL TESTS:  Eval: 5 times sit to stand: 30.77 sec with hands pressing up on thighs, with pain  Timed up and go (TUG): 14.69 sec   TREATMENT DATE:  10/26/2023 NuStep Level 3 6 mins- PT present to discuss status Seated scapular retraction  x 10 Seated TA activation with ball squeeze 2 x 10 TA activation + hip adduction ball squeeze x 20 Seated LAQ 2# AW 2 x 10 bilateral  Standing shoulder row with red TB 2 x 10 Sit to stand 3 x 5 holding 4# DB Standing shoulder flexion & scaption 2# x 10 each Seated clam with red loop 2 x 10 Standing shoulder row with TB 2 x 10 Manual : STM to bilateral upper trap for improved muscle elongation and decreased pain   10/26/2023 NuStep Level 3 7 mins- PT present to discuss status Seated scapular retraction  x  10 Seated TA activation with ball squeeze 2 x 10 Hamstring stretch at stair 2 x 30 sec bilateral  Sit to stand 2 x 5 Standing shoulder row & extension with red TB 2 x 10 Standing shoulder flexion & scaption 2# x 10 each Seated LAQ 2# x 10 bilateral  Seated stability ball stretch (forwards) x 10   10/25/2023: Reviewed the role of PT ReviewedHEP  PATIENT EDUCATION:  Education details: Issued HEP Person educated: Patient and Spouse Education method: Explanation, Demonstration, and Handouts Education comprehension: verbalized understanding and returned demonstration  HOME EXERCISE PROGRAM: Access Code: [email or web address] URL: [email or web address] Date: 10/25/2023 Prepared by: Jarrell Menke  Exercises - Seated Scapular Retraction  - 1 x daily - 7 x weekly - 2 sets - 10 reps - Seated Transversus Abdominis Bracing  - 1 x daily - 7 x weekly - 2 sets - 10 reps - Seated Hamstring Stretch  - 1 x daily - 7 x weekly - 3 sets - 30 sec hold - Sit to Stand  - 1 x daily - 7 x weekly - 3 sets - 5 reps  ASSESSMENT:  CLINICAL IMPRESSION: Teandra presents to therapy with no pain. She verbalized compliance with HEP. Introduced increased weight today and patient verbalized feeling a challenge. Patient required frequent verbal cues for upright posture and core engagement while performing exercises. PT monitored patient throughout and provided modifications as needed. Patient verbalized occasional right wrist plan during treatment session but she was able to participate in all exercises. Patient verbalized some frustrations with her memory loss and decreased functional mobility. Encouraged patient and offered more positive outlooks on the situation. Patient will benefit from skilled PT to address the below impairments and improve overall function.   OBJECTIVE IMPAIRMENTS:  Abnormal gait, decreased balance, decreased cognition, decreased coordination, difficulty walking, decreased strength, increased muscle spasms, impaired flexibility, postural dysfunction, and pain.   ACTIVITY LIMITATIONS: carrying, lifting, bending, sitting, standing, squatting, and stairs  PARTICIPATION LIMITATIONS: community activity and yard work  PERSONAL FACTORS: Time since onset of injury/illness/exacerbation and 3+ comorbidities: Alzheimer's, Fibromyalgia, Cervical radiculopathy are also affecting patient's functional outcome.   REHAB POTENTIAL: Good  CLINICAL DECISION MAKING: Evolving/moderate complexity  EVALUATION COMPLEXITY: Moderate   GOALS: Goals reviewed with patient? Yes  SHORT TERM GOALS: Target date: 11/17/2023  Patient and spouse will be independent with initial HEP. Baseline:  Goal status: INITIAL  2.  Patient will be able to participate in 3/6 minute walk test to establish a baseline measurement. Baseline:  Goal status: INITIAL   LONG TERM GOALS: Target date: 12/15/2023  Patient and spouse will be independent with advanced HEP. Baseline:  Goal status: INITIAL  2.  Patient will be able to ambulate for at least 10-15 min without increased pain to allow for community reintegration. Baseline:  Goal status: INITIAL  3.  Patient will improve 5 times sit to/from stand to 20 sec or less to demonstrate improved functional strength. Baseline: 30.77 sec Goal status: INITIAL  4.  Patient to report ability to being able to perform her gardening and desired activities in the yard without increased pain. Baseline:  Goal status: INITIAL  5.  Patient to report no new falls within 2 weeks of discharge. Baseline:  Goal status: INITIAL    PLAN:  PT FREQUENCY: 1-2x/week  PT DURATION: 8 weeks  PLANNED INTERVENTIONS: [id number]- PT Re-evaluation, 97750- Physical Performance Testing, 97110-Therapeutic exercises, 97530- Therapeutic activity, [id number]- Neuromuscular  re-education, 97535- Self Care, [id number]- Manual therapy, [id number]- Gait training, [id number]- Canalith repositioning, [id number]- Aquatic Therapy, [phone]- Electrical stimulation (unattended), [phone]- Electrical stimulation (manual), [id number]- Vasopneumatic device, [id number]- Ultrasound, [id number]- Traction (mechanical), [id number]- Ionotophoresis 4mg /ml Dexamethasone, 79439 (1-2 muscles), [id number] (3+ muscles)- Dry Needling, Patient/Family education, Balance training, Stair training, Taping, Joint mobilization, Joint manipulation, Spinal manipulation, Spinal mobilization, Cryotherapy, and Moist heat  PLAN FOR NEXT SESSION: continue core & functional strengthening, flexibility, manual/dry needling as indicated     Kristeen Sar,  PT 11/08/23 12:03 PM [facility] [address] [place], [place] [address] Phone # [phone] Fax [phone]

## 2023-11-09 ENCOUNTER — Emergency Department (HOSPITAL_BASED_OUTPATIENT_CLINIC_OR_DEPARTMENT_OTHER)

## 2023-11-09 ENCOUNTER — Ambulatory Visit: Admitting: Student

## 2023-11-09 ENCOUNTER — Other Ambulatory Visit: Payer: Self-pay

## 2023-11-09 ENCOUNTER — Emergency Department (HOSPITAL_BASED_OUTPATIENT_CLINIC_OR_DEPARTMENT_OTHER)
Admission: EM | Admit: 2023-11-09 | Discharge: 2023-11-09 | Disposition: A | Discharge status: Home / Self Care | Source: Ambulatory Visit | Attending: Emergency Medicine | Admitting: Emergency Medicine

## 2023-11-09 ENCOUNTER — Encounter (HOSPITAL_BASED_OUTPATIENT_CLINIC_OR_DEPARTMENT_OTHER): Payer: Self-pay | Admitting: Radiology

## 2023-11-09 DIAGNOSIS — W108XXA Fall (on) (from) other stairs and steps, initial encounter: Secondary | ICD-10-CM | POA: Insufficient documentation

## 2023-11-09 DIAGNOSIS — Y92009 Unspecified place in unspecified non-institutional (private) residence as the place of occurrence of the external cause: Secondary | ICD-10-CM | POA: Diagnosis not present

## 2023-11-09 DIAGNOSIS — M792 Neuralgia and neuritis, unspecified: Secondary | ICD-10-CM | POA: Insufficient documentation

## 2023-11-09 DIAGNOSIS — M25561 Pain in right knee: Secondary | ICD-10-CM | POA: Diagnosis not present

## 2023-11-09 DIAGNOSIS — R9089 Other abnormal findings on diagnostic imaging of central nervous system: Secondary | ICD-10-CM | POA: Diagnosis not present

## 2023-11-09 DIAGNOSIS — M25511 Pain in right shoulder: Secondary | ICD-10-CM | POA: Diagnosis not present

## 2023-11-09 DIAGNOSIS — R791 Abnormal coagulation profile: Secondary | ICD-10-CM | POA: Insufficient documentation

## 2023-11-09 DIAGNOSIS — R9082 White matter disease, unspecified: Secondary | ICD-10-CM | POA: Diagnosis not present

## 2023-11-09 DIAGNOSIS — R202 Paresthesia of skin: Secondary | ICD-10-CM | POA: Insufficient documentation

## 2023-11-09 DIAGNOSIS — Y9302 Activity, running: Secondary | ICD-10-CM | POA: Insufficient documentation

## 2023-11-09 DIAGNOSIS — R079 Chest pain, unspecified: Secondary | ICD-10-CM | POA: Diagnosis not present

## 2023-11-09 DIAGNOSIS — R52 Pain, unspecified: Secondary | ICD-10-CM | POA: Diagnosis present

## 2023-11-09 DIAGNOSIS — I6529 Occlusion and stenosis of unspecified carotid artery: Secondary | ICD-10-CM | POA: Diagnosis not present

## 2023-11-09 DIAGNOSIS — J929 Pleural plaque without asbestos: Secondary | ICD-10-CM | POA: Diagnosis not present

## 2023-11-09 LAB — BASIC METABOLIC PANEL WITH GFR
Anion gap: 14 (ref 5–15)
BUN: 14 mg/dL (ref 8–23)
CO2: 20 mmol/L — ABNORMAL LOW (ref 22–32)
Calcium: 9.9 mg/dL (ref 8.9–10.3)
Chloride: 106 mmol/L (ref 98–111)
Creatinine, Ser: 1.01 mg/dL — ABNORMAL HIGH (ref 0.44–1.00)
GFR, Estimated: 57 mL/min — ABNORMAL LOW (ref >60)
Glucose, Bld: 95 mg/dL (ref 70–99)
Potassium: 3.7 mmol/L (ref 3.5–5.1)
Sodium: 141 mmol/L (ref 135–145)

## 2023-11-09 LAB — CBC WITH DIFFERENTIAL/PLATELET
Abs Immature Granulocytes: 0.01 K/uL (ref 0.00–0.07)
Basophils Absolute: 0 K/uL (ref 0.0–0.1)
Basophils Relative: 1 %
Eosinophils Absolute: 0 K/uL (ref 0.0–0.5)
Eosinophils Relative: 1 %
HCT: 37.3 % (ref 36.0–46.0)
Hemoglobin: 12.7 g/dL (ref 12.0–15.0)
Immature Granulocytes: 0 %
Lymphocytes Relative: 34 %
Lymphs Abs: 1.4 K/uL (ref 0.7–4.0)
MCH: 31.1 pg (ref 26.0–34.0)
MCHC: 34 g/dL (ref 30.0–36.0)
MCV: 91.2 fL (ref 80.0–100.0)
Monocytes Absolute: 0.4 K/uL (ref 0.1–1.0)
Monocytes Relative: 10 %
Neutro Abs: 2.2 K/uL (ref 1.7–7.7)
Neutrophils Relative %: 54 %
Platelets: 250 K/uL (ref 150–400)
RBC: 4.09 MIL/uL (ref 3.87–5.11)
RDW: 13.3 % (ref 11.5–15.5)
WBC: 4 K/uL (ref 4.0–10.5)
nRBC: 0 % (ref 0.0–0.2)

## 2023-11-09 LAB — URINALYSIS, MICROSCOPIC (REFLEX)

## 2023-11-09 LAB — URINALYSIS, ROUTINE W REFLEX MICROSCOPIC
Bilirubin Urine: NEGATIVE
Glucose, UA: NEGATIVE mg/dL
Hgb urine dipstick: NEGATIVE
Ketones, ur: NEGATIVE mg/dL
Nitrite: NEGATIVE
Protein, ur: NEGATIVE mg/dL
Specific Gravity, Urine: 1.02 (ref 1.005–1.030)
pH: 7.5 (ref 5.0–8.0)

## 2023-11-09 LAB — PROTIME-INR
INR: 0.9 (ref 0.8–1.2)
Prothrombin Time: 12.5 s (ref 11.4–15.2)

## 2023-11-09 MED ORDER — IBUPROFEN 400 MG PO TABS
600.0000 mg | ORAL_TABLET | Freq: Once | ORAL | Status: AC
  Administered 2023-11-09: 600 mg via ORAL
  Filled 2023-11-09: qty 1

## 2023-11-09 MED ORDER — IOHEXOL 350 MG/ML SOLN
100.0000 mL | Freq: Once | INTRAVENOUS | Status: AC | PRN
  Administered 2023-11-09: 50 mL via INTRAVENOUS

## 2023-11-09 MED ORDER — GABAPENTIN 100 MG PO CAPS
100.0000 mg | ORAL_CAPSULE | Freq: Once | ORAL | Status: AC
  Administered 2023-11-09: 100 mg via ORAL
  Filled 2023-11-09: qty 1

## 2023-11-09 NOTE — ED Provider Notes (Signed)
 [place] [facility]MENT AT [facility] Provider Note   CSN: [id number] Arrival date & time: 11/09/23  1041     Patient presents with: Dawn Simmons   Mihaela Fajardo is a 78 y.o. female.   78 y/o Female presents to ED with husband complaining of Rt sides pain following a fall in [place]. Pt was running back to the house to avoid the rain when she tripped going up the stairs to her house and landed on the rt side of her body. Pt had no obvious fractures, wounds, or injuries after the fall.  Patient reported that she did not get assessed following the incident.  Patient reported dull, sharp, and achy pain throughout her entire right side with no obvious triggers.  Patient was seen at PCP with referral to physical therapy.  Patient has done 3 physical therapy sessions without relief.  Tylenol and Aleve taken at home without relief.  Patient reports only taking diazepam  and Lexapro .  Allergies discussed with patient.    Fall Pertinent negatives include no chest pain and no headaches.       Prior to Admission medications   Medication Sig Start Date End Date Taking? Authorizing Provider  Ascorbic Acid (VITAMIN C) 100 MG tablet Take 100 mg by mouth daily.    [provider]  Calcium-Magnesium 200-100 MG TABS Take 4 tablets by mouth daily. 2 tablets in the morning and 2 tablets in the evening    [provider]  diazepam  (VALIUM ) 5 MG tablet Take 1/4-1/2 tablet po QHS 10/20/23   Hurst, Verneita DASEN, PA-C  ergocalciferol  (DRISDOL) 200 MCG/ML drops     [provider]  escitalopram  (LEXAPRO ) 10 MG tablet Take 1 tablet (10 mg total) by mouth daily. 10/20/23   Rhys Verneita T, PA-C  Multiple Vitamin (MULTIVITAMIN) capsule Take 1 capsule by mouth daily.    [provider]  Omega-3 1000 MG CAPS Take by mouth.    [provider]  TURMERIC PO Take by mouth. Patient not taking: Reported on 10/25/2023    [provider]    Allergies: Aricept   [donepezil  hcl], Doxycycline, Tetanus toxoids, and Flexeril [cyclobenzaprine]    Review of Systems  Constitutional:  Negative for activity change, appetite change, chills, diaphoresis, fatigue and fever.  Eyes: Negative.  Negative for photophobia and visual disturbance.  Cardiovascular:  Negative for chest pain.  Musculoskeletal:  Positive for arthralgias and myalgias. Negative for joint swelling.  Skin: Negative.  Negative for color change and wound.  Neurological:  Positive for numbness. Negative for dizziness, syncope, facial asymmetry, speech difficulty, weakness, light-headedness and headaches.  Psychiatric/Behavioral:  Negative for confusion.     Updated Vital Signs BP 134/74   Pulse 63   Temp 97.8 F (36.6 C)   Resp 16   Ht 5' 5 (1.651 m)   SpO2 100%   BMI 20.90 kg/m   Physical Exam Constitutional:      Appearance: Normal appearance.  HENT:     Head: Normocephalic and atraumatic.     Mouth/Throat:     Mouth: Mucous membranes are moist.  Eyes:     Extraocular Movements: Extraocular movements intact.     Pupils: Pupils are equal, round, and reactive to light.  Cardiovascular:     Rate and Rhythm: Normal rate.  Pulmonary:     Effort: Pulmonary effort is normal.     Breath sounds: Normal breath sounds.  Musculoskeletal:        General: Tenderness present. No swelling, deformity or  signs of injury.     Cervical back: Normal range of motion.     Right lower leg: No edema.     Left lower leg: No edema.  Skin:    General: Skin is warm and dry.  Neurological:     General: No focal deficit present.     Mental Status: She is alert. Mental status is at baseline.     (all labs ordered are listed, but only abnormal results are displayed) Labs Reviewed  URINALYSIS, ROUTINE W REFLEX MICROSCOPIC - Abnormal; Notable for the following components:      Result Value   Leukocytes,Ua TRACE (*)    All other components within normal limits  BASIC METABOLIC PANEL WITH GFR -  Abnormal; Notable for the following components:   CO2 20 (*)    Creatinine, Ser 1.01 (*)    GFR, Estimated 57 (*)    All other components within normal limits  URINALYSIS, MICROSCOPIC (REFLEX) - Abnormal; Notable for the following components:   Bacteria, UA FEW (*)    All other components within normal limits  CBC WITH DIFFERENTIAL/PLATELET  PROTIME-INR    EKG: None  Radiology: DG Knee 2 Views Right Result Date: 11/09/2023 CLINICAL DATA:  Right knee pain after fall EXAM: RIGHT KNEE - 1-2 VIEW COMPARISON:  None Available. FINDINGS: No evidence of fracture, dislocation, or joint effusion. No evidence of arthropathy or other focal bone abnormality. Soft tissues are unremarkable. IMPRESSION: Negative. Electronically Signed   By: Ryan Salvage M.D.   On: 11/09/2023 15:08   DG Shoulder Right Result Date: 11/09/2023 CLINICAL DATA:  Right shoulder pain after fall h EXAM: RIGHT SHOULDER - 2+ VIEW COMPARISON:  Chest radiograph 11/09/2023 FINDINGS: Glenohumeral and AC joint alignment unremarkable. Bony demineralization. No visible fracture or acute bony findings. Off axis and suboptimal transscapular view. IMPRESSION: 1. No acute findings. 2. Bony demineralization. Electronically Signed   By: Ryan Salvage M.D.   On: 11/09/2023 15:08   CT ANGIO HEAD NECK W WO CM Result Date: 11/09/2023 CLINICAL DATA:  Transient ischemic attack (TIA) EXAM: CT ANGIOGRAPHY HEAD AND NECK WITH AND WITHOUT CONTRAST TECHNIQUE: Multidetector CT imaging of the head and neck was performed using the standard protocol during bolus administration of intravenous contrast. Multiplanar CT image reconstructions and MIPs were obtained to evaluate the vascular anatomy. Carotid stenosis measurements (when applicable) are obtained utilizing NASCET criteria, using the distal internal carotid diameter as the denominator. RADIATION DOSE REDUCTION: This exam was performed according to the departmental dose-optimization program which  includes automated exposure control, adjustment of the mA and/or kV according to patient size and/or use of iterative reconstruction technique. CONTRAST:  50mL OMNIPAQUE  IOHEXOL  350 MG/ML SOLN COMPARISON:  CT of the neck dated October 18, 2022 and CT of the head dated November 05, 2021. FINDINGS: CT HEAD FINDINGS Brain: Moderate generalized cerebral and cerebellar volume loss. Mild to moderate periventricular white matter disease. No evidence of hemorrhage, mass, cortical infarct or hydrocephalus. Vascular: Mild vascular calcifications. Skull: Intact and unremarkable. Sinuses/Orbits: Clear paranasal sinuses.  Normal orbits. Other: None. Review of the MIP images confirms the above findings CTA NECK FINDINGS Aortic arch: Standard branching. Imaged portion shows no evidence of aneurysm or dissection. No significant stenosis of the major arch vessel origins. Right carotid system: The common carotid arteries normal in caliber. There is minimal calcific plaque within the carotid bulb. There is no luminal stenosis. The internal carotid artery is normal in caliber and unremarkable. Left carotid system: The common carotid artery is  normal in caliber. There is minimal calcific plaque within the carotid bulb. There is no appreciable stenosis. The internal carotid arteries normal in caliber throughout the neck. Vertebral arteries: The vertebral arteries are codominant and normal in caliber throughout the respective courses. Skeleton: Negative. Other neck: Negative. Upper chest: Mild biapical pleuroparenchymal fibrosis. Review of the MIP images confirms the above findings CTA HEAD FINDINGS Anterior circulation: The cranial in cavernous segments of the internal carotid arteries are normal in caliber. The anterior and middle cerebral arteries and their branches are normal in caliber. No evidence of aneurysm, large vessel occlusion or flow-limiting stenosis. There is fetal type origin of the right posterior cerebral artery. Posterior  circulation: The vertebrobasilar system is unremarkable. The right P1 segment is diminutive and there is moderate to severe stenosis distally. The P2 segment is widely patent. The cerebellar arteries are patent. Venous sinuses: As permitted by contrast timing, patent. Anatomic variants: Fetal type origin of the right posterior cerebral artery. Review of the MIP images confirms the above findings IMPRESSION: 1. Negative CT angiogram of the neck. 2. 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 Electronically Signed   By: Evalene Coho M.D.   On: 11/09/2023 14:52   DG Chest Portable 1 View Result Date: 11/09/2023 CLINICAL DATA:  Right-sided pain status post fall EXAM: PORTABLE CHEST - 1 VIEW COMPARISON:  Sep 06, 2018 FINDINGS: Biapical pleural thickening. No focal airspace consolidation, pleural effusion, or pneumothorax. No cardiomegaly. No acute fracture or destructive lesion. IMPRESSION: No acute cardiopulmonary abnormality. Electronically Signed   By: Rogelia Myers M.D.   On: 11/09/2023 13:02   DG Pelvis Portable Result Date: 11/09/2023 CLINICAL DATA:  Fall. EXAM: PORTABLE PELVIS 1-2 VIEWS COMPARISON:  None Available. FINDINGS: No acute fracture or dislocation. The bones are osteopenic. Mild bilateral hip arthritic changes. The soft tissues are unremarkable. IMPRESSION: 1. No acute fracture or dislocation. 2. Osteopenia. Electronically Signed   By: Vanetta Chou M.D.   On: 11/09/2023 12:59    Procedures   Medications Ordered in the ED  ibuprofen  (ADVIL ) tablet 600 mg (600 mg Oral Given 11/09/23 1328)  gabapentin  (NEURONTIN ) capsule 100 mg (100 mg Oral Given 11/09/23 1328)  iohexol  (OMNIPAQUE ) 350 MG/ML injection 100 mL (50 mLs Intravenous Contrast Given 11/09/23 1413)                                   Medical Decision Making 78 year old female arrives to ED with husband with complaints of right-sided  pain since April following a fall.  Patient denies LOC, blood thinners, obvious fractures, or serious injury following the fall.  Patient reports she did not get seen following the fall by her PCP or emergency department.  Patient reported the pain began randomly with no obvious triggers.  She was given a referral for physical therapy with no relief.   On exam patient was able to move all extremities without assistance but reported pain.  Pain to palpation elected pain in other areas of the right side.  Passive range of motion of all joints elective pain for the patient.  No specific bony tenderness was noted on exam.  Neurological exam was normal.  Patient reported it was difficult to describe the pain.  She reported pain as achy, dull, sharp with no specific triggers.   After initial exam while waiting for labs patient  reported that her right side became numb with tingling.  Patient advises that this is not new but husband reports this is a new symptom.  Patient advised we need to rule out CVA.  X-rays ordered to rule out any previous fractures from initial fall or osteoarthritis causing the pain.  Chest x-ray showed no acute abnormalities, shoulder/knee/hip x-ray showed no evidence osteoporotic changes or fractures.  CTA was negative.  Results discussed with patient, with vague symptoms and difficulty with defining onset of symptoms due to patient being poor historian it was advised MRI is the most definitive way to rule out any strokes.  They declined [facility] transport for MRI today. Husband and patient felt comfortable going home and following up with PCP ASAP.       Amount and/or Complexity of Data Reviewed Labs: ordered. Radiology: ordered.  Risk Prescription drug management.    Final diagnoses:  Nerve pain    ED Discharge Orders     None          Myriam Fonda RAMAN, [place] 11/09/23 1558    Elnor Hila P, DO 11/17/23 2335

## 2023-11-09 NOTE — ED Notes (Signed)
 Pt made multiple complaints of IV hurting. Clean, dry, intact and flush well.   No redness noted. Pt educated on risk of removing IV. PT still expressed desire to have IV removed.

## 2023-11-09 NOTE — ED Triage Notes (Addendum)
 Pt presents with c/o righted sided pain. Pt had a fall a few months ago and states that since the fall her whole right side hurts. Has worked with PT a few times with no improvement per pt.

## 2023-11-09 NOTE — ED Notes (Signed)
 Pt presents with confusion. When asked husband if this is her baseline he expressed she is following up with neurologist for altered mental status.

## 2023-11-14 ENCOUNTER — Encounter: Payer: Self-pay | Admitting: Physical Therapy

## 2023-11-14 ENCOUNTER — Ambulatory Visit: Attending: Family Medicine | Admitting: Physical Therapy

## 2023-11-14 DIAGNOSIS — R2689 Other abnormalities of gait and mobility: Secondary | ICD-10-CM | POA: Diagnosis not present

## 2023-11-14 DIAGNOSIS — R252 Cramp and spasm: Secondary | ICD-10-CM | POA: Insufficient documentation

## 2023-11-14 DIAGNOSIS — M5459 Other low back pain: Secondary | ICD-10-CM | POA: Diagnosis not present

## 2023-11-14 DIAGNOSIS — M542 Cervicalgia: Secondary | ICD-10-CM | POA: Diagnosis not present

## 2023-11-14 DIAGNOSIS — M6281 Muscle weakness (generalized): Secondary | ICD-10-CM | POA: Insufficient documentation

## 2023-11-14 NOTE — Therapy (Signed)
 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   Benzodiazepine withdrawal without complication    Cervical radiculopathy at C6    Cheilitis    Chronic fatigue    Chronic obstructive pulmonary disease 09/07/2018   Quit smoking 1974 with copd changes on cxr but no limiting sob as of 09/06/2018  - 09/06/2018   Walked RA  2 laps @  approx 237ft each @ fast pace  stopped due to  End of study, no sob and sats  100% at end   Fibromyalgia 06/01/2015   History of chicken pox 09/15/2019   History of COVID-19 02/05/2020   History of measles    History of mumps    History of rubella    History of syncope    Insomnia 06/01/2015   Iron deficiency anemia    Left shoulder pain 10/31/2019   Leukopenia 09/15/2019   Low back pain 09/08/2021   Mitral valve prolapse    Mixed hyperlipidemia 11/04/2021   Neuritis    Palpitations    Pharyngitis    Psychogenic nonepileptic seizure    PTSD (post-traumatic stress disorder) 09/15/2019   Restless legs syndrome    Right sided weakness 01/10/2020   Upper airway cough syndrome 09/06/2018   Onset Nov 2019 with uri  - Sinus CT 56/12/2018 neg    Visual disturbance 11/02/2021   Vitamin D  deficiency    Von Willebrand disease    Past Surgical History:  Procedure Laterality Date   NO PAST SURGERIES     Patient Active Problem List   Diagnosis Date Noted   Neurocognitive disorder 02/01/2023   Mood disorder  (HCC) 02/01/2023   Neck pain 10/10/2022   Memory loss 05/30/2022   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   Allergic rhinitis    Cervical radiculopathy at C6    Chronic fatigue    Psychogenic nonepileptic seizure    Iron deficiency anemia    Neuritis    Restless legs syndrome    Vitamin D  deficiency    Cheilitis    Mixed hyperlipidemia 11/04/2021   Visual disturbance 11/02/2021   Low back pain 09/08/2021   History of COVID-19 02/05/2020   Right sided weakness 01/10/2020   Left shoulder pain 10/31/2019   Leukopenia 09/15/2019   PTSD (post-traumatic stress disorder) 09/15/2019   History of chicken pox 09/15/2019   Von Willebrand disease    Chronic obstructive pulmonary disease 09/07/2018   Upper airway cough syndrome 09/06/2018   Fibromyalgia 06/01/2015   Insomnia 06/01/2015   History of syncope    Palpitations    Mitral valve prolapse     PCP: Domenica Harlene LABOR, MD  REFERRING PROVIDER: Domenica Harlene LABOR, MD  REFERRING DIAG: M54.2 (ICD-10-CM) - Neck pain M54.50 (ICD-10-CM) - Low back pain, unspecified back pain laterality,  unspecified chronicity, unspecified whether sciatica present  THERAPY DIAG:  Cervicalgia  Other low back pain  Cramp and spasm  Other abnormalities of gait and mobility  Muscle weakness (generalized)  Rationale for Evaluation and Treatment: Rehabilitation  ONSET DATE: 7-8 years ago, but pain worsened after fall earlier this year  SUBJECTIVE:                                                                                                                                                                                                         SUBJECTIVE STATEMENT: I went to the hospital to do a work up since I never had one after my fall but they said they didn't find anything.  The weirdest thing is that all of my pain was right sided and now it has completely flipped and gone to my Lt side.     From Eval: Patient  reports that she has had pain for at least 7-8 years, intermittently.  She does state that she has had syncope in the past, but unrelated to fall 2-3 months ago. Pt reports being outside recently, doing yard work when it started raining, pt ran and slipped   Hand dominance: Right Patient's husband, Koren, present throughout  PERTINENT HISTORY:  prodromal Alzheimer's, Fibromyalgia, Cervical Radiculopathy, Anxiety, depression, PTSD, insomnia  PAIN:  Are you having pain? Yes: NPRS scale: 10/10 time of incident; currently 5/10 Pain location: Rt side body - now Lt side of body reported on 8/5 appt (had a full work up at hospital this past weekend and nothing signif found) Pain description: Sharp, achy Aggravating factors: First waking in the morning Relieving factors: Unknown  PRECAUTIONS: None  RED FLAGS: None     WEIGHT BEARING RESTRICTIONS: No  FALLS:  Has patient fallen in last 6 months? Yes. Number of falls 1 where she slipped outside on wet leaves  LIVING ENVIRONMENT: Lives with: lives with their spouse Lives in: House/apartment Stairs: 2 story home Has following equipment at home: None  OCCUPATION: Retired   PLOF: Independent and Leisure: Gardening, being outside, yard work   PATIENT GOALS: Pt would like to be able to get back to doing yard work  NEXT MD VISIT: Every 6 months  OBJECTIVE:  Note: Objective measures were completed at Evaluation unless otherwise noted.  DIAGNOSTIC FINDINGS:  Lumbar radiograph on 09/10/2021 IMPRESSION: 1. Mild broad-based dextroscoliotic curvature. 2. Mild L4-L5 disc space narrowing. 3. Minor L5-S1 facet hypertrophy.  PATIENT SURVEYS:  LEFS  Extreme difficulty/unable (0), Quite a bit of difficulty (1), Moderate difficulty (2), Little difficulty (3), No  difficulty (4) Survey date:  10/25/23  Any of your usual work, housework or school activities 2  2. Usual hobbies, recreational or sporting activities 1  3. Getting into/out of the bath  2  4. Walking between rooms 3  5. Putting on socks/shoes 3  6. Squatting  2  7. Lifting an object, like a bag of groceries from the floor 1  8. Performing light activities around your home 3  9. Performing heavy activities around your home 0  10. Getting into/out of a car 3  11. Walking 2 blocks 3  12. Walking 1 mile 0  13. Going up/down 10 stairs (1 flight) 2  14. Standing for 1 hour 0  15.  sitting for 1 hour 3  16. Running on even ground 0  17. Running on uneven ground 0  18. Making sharp turns while running fast 0  19. Hopping  0  20. Rolling over in bed 3  Score total:  31/80 = 38.75%     COGNITION: Overall cognitive status: History of cognitive impairments - at baseline  SENSATION: Pt reports tingling in hands  POSTURE: rounded shoulders and forward head  PALPATION: Tightness in bilateral upper traps and lumbar paraspinals.  Crepitus noted in Rt scapula  CERVICAL ROM:   Active ROM *=pain A/ROM (deg) eval  Flexion 50*  Extension 45*  Right lateral flexion 25*  Left lateral flexion 30*  Right rotation 52*  Left rotation 55*   (Blank rows = not tested)  UPPER EXTREMITY ROM: Eval: WNL with some pain at end ROM on Rt shoulder    LOWER/UPPER EXTREMITY MMT:  Eval:   Right shoulder strength grossly 4-to 4/5 Left shoulder flexion is 5-/5, left shoulder abduction is 4/5 Bilateral LE strength of 4- to 4/5 grossly throughout   FUNCTIONAL TESTS:  Eval: 5 times sit to stand: 30.77 sec with hands pressing up on thighs, with pain  Timed up and go (TUG): 14.69 sec   TREATMENT DATE:  11/14/23 NuStep L3 x7' - PT present to discuss status Standing shoulder row with TB 2 x 10 Standing shoulder flexion & scaption 2# x 10 each Seated TA activation with ball squeeze 10x5 Seated LAQ 2# 2 x 10 bilateral  Sit to stand 3 x 5 holding 4# DB Seated clam with red loop 2 x 10 Sit to stand 3 x 5 holding 4# DB  10/26/2023 NuStep Level 3 6 mins- PT present to discuss  status Seated scapular retraction  x 10 Seated TA activation with ball squeeze 2 x 10 TA activation + hip adduction ball squeeze x 20 Seated LAQ 2# AW 2 x 10 bilateral  Standing shoulder row with red TB 2 x 10 Sit to stand 3 x 5 holding 4# DB Standing shoulder flexion & scaption 2# x 10 each Seated clam with red loop 2 x 10 Standing shoulder row with TB 2 x 10 Manual : STM to bilateral upper trap for improved muscle elongation and decreased pain   10/26/2023 NuStep Level 3 7 mins- PT present to discuss status Seated scapular retraction  x 10 Seated TA activation with ball squeeze 2 x 10 Hamstring stretch at stair 2 x 30 sec bilateral  Sit to stand 2 x 5 Standing shoulder row & extension with red TB 2 x 10 Standing shoulder flexion & scaption 2# x 10 each Seated LAQ 2# x 10 bilateral  Seated stability ball stretch (forwards) x 10  PATIENT EDUCATION:  Education details: Issued HEP Person educated: Patient and Spouse Education method: Explanation, Demonstration, and Handouts Education comprehension: verbalized understanding and returned demonstration  HOME EXERCISE PROGRAM: Access Code: [email or web address] URL: [email or web address] Date: 10/25/2023 Prepared by: Jarrell Menke  Exercises - Seated Scapular Retraction  - 1 x daily - 7 x weekly - 2 sets - 10 reps - Seated Transversus Abdominis Bracing  - 1 x daily - 7 x weekly - 2 sets - 10 reps - Seated Hamstring Stretch  - 1 x daily - 7 x weekly - 3 sets - 30 sec hold - Sit to Stand  - 1 x daily - 7 x weekly - 3 sets - 5 reps  ASSESSMENT:  CLINICAL IMPRESSION: Josephyne presented to the ED last Thurs to have a work up over her symptoms.  Extensive xrays and CTA were done with no signif findings.  She reports that her symptoms which started along the Rt side have jumped to the Lt side.  She did report  various pains across both Lt and Rt sides in various joints and head throughout the session but she was able to continue participating within session.   OBJECTIVE IMPAIRMENTS: Abnormal gait, decreased balance, decreased cognition, decreased coordination, difficulty walking, decreased strength, increased muscle spasms, impaired flexibility, postural dysfunction, and pain.   ACTIVITY LIMITATIONS: carrying, lifting, bending, sitting, standing, squatting, and stairs  PARTICIPATION LIMITATIONS: community activity and yard work  PERSONAL FACTORS: Time since onset of injury/illness/exacerbation and 3+ comorbidities: Alzheimer's, Fibromyalgia, Cervical radiculopathy are also affecting patient's functional outcome.   REHAB POTENTIAL: Good  CLINICAL DECISION MAKING: Evolving/moderate complexity  EVALUATION COMPLEXITY: Moderate   GOALS: Goals reviewed with patient? Yes  SHORT TERM GOALS: Target date: 11/17/2023  Patient and spouse will be independent with initial HEP. Baseline:  Goal status: ongoing 8/5  2.  Patient will be able to participate in 3/6 minute walk test to establish a baseline measurement. Baseline:  Goal status: INITIAL   LONG TERM GOALS: Target date: 12/15/2023  Patient and spouse will be independent with advanced HEP. Baseline:  Goal status: INITIAL  2.  Patient will be able to ambulate for at least 10-15 min without increased pain to allow for community reintegration. Baseline:  Goal status: INITIAL  3.  Patient will improve 5 times sit to/from stand to 20 sec or less to demonstrate improved functional strength. Baseline: 30.77 sec Goal status: INITIAL  4.  Patient to report ability to being able to perform her gardening and desired activities in the yard without increased pain. Baseline:  Goal status: INITIAL  5.  Patient to report no new falls within 2 weeks of discharge. Baseline:  Goal status: INITIAL    PLAN:  PT FREQUENCY: 1-2x/week  PT DURATION: 8  weeks  PLANNED INTERVENTIONS: 97164- PT Re-evaluation, 97750- Physical Performance Testing, 97110-Therapeutic exercises, 97530- Therapeutic activity, [id number]- Neuromuscular re-education, 97535- Self Care, [id number]- Manual therapy, [phone]- Gait training, [phone]- Canalith repositioning, [id number]- Aquatic Therapy, [phone]- Electrical stimulation (unattended), [phone]- Electrical stimulation (manual), [id number]- Vasopneumatic device, [id number]- Ultrasound, [id number]- Traction (mechanical), [id number]- Ionotophoresis 4mg /ml Dexamethasone, 20560 (1-2 muscles), 20561 (3+ muscles)- Dry Needling, Patient/Family education, Balance training, Stair training, Taping, Joint mobilization, Joint manipulation, Spinal manipulation, Spinal mobilization, Cryotherapy, and Moist heat  PLAN FOR NEXT SESSION: try 3 or 6 MWT to meet STG, continue core & functional strengthening, flexibility, manual/dry needling as indicated     Orvil Fester, PT 11/14/23 11:49 AM  [facility] [address], [address] [place], [place] [address] Phone #  [phone] Fax [phone]

## 2023-11-21 ENCOUNTER — Encounter: Payer: Self-pay | Admitting: Physical Therapy

## 2023-11-21 ENCOUNTER — Ambulatory Visit: Admitting: Physical Therapy

## 2023-11-21 DIAGNOSIS — M5459 Other low back pain: Secondary | ICD-10-CM | POA: Diagnosis not present

## 2023-11-21 DIAGNOSIS — R2689 Other abnormalities of gait and mobility: Secondary | ICD-10-CM | POA: Diagnosis not present

## 2023-11-21 DIAGNOSIS — M542 Cervicalgia: Secondary | ICD-10-CM | POA: Diagnosis not present

## 2023-11-21 DIAGNOSIS — M6281 Muscle weakness (generalized): Secondary | ICD-10-CM | POA: Diagnosis not present

## 2023-11-21 DIAGNOSIS — R252 Cramp and spasm: Secondary | ICD-10-CM

## 2023-11-21 NOTE — Therapy (Addendum)
 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   Benzodiazepine withdrawal without complication    Cervical radiculopathy at C6    Cheilitis    Chronic fatigue    Chronic obstructive pulmonary disease 09/07/2018   Quit smoking 1974 with copd changes on cxr but no limiting sob as of 09/06/2018  - 09/06/2018   Walked RA  2 laps @  approx 232ft each @ fast pace  stopped due to  End of study, no sob and sats  100% at end   Fibromyalgia 06/01/2015   History of chicken pox 09/15/2019   History of COVID-19 02/05/2020   History of measles    History of mumps    History of rubella    History of syncope    Insomnia 06/01/2015   Iron deficiency anemia    Left shoulder pain 10/31/2019   Leukopenia 09/15/2019   Low back pain 09/08/2021   Mitral valve prolapse    Mixed hyperlipidemia 11/04/2021   Neuritis    Palpitations    Pharyngitis    Psychogenic nonepileptic seizure    PTSD (post-traumatic stress disorder) 09/15/2019   Restless legs syndrome    Right sided weakness 01/10/2020   Upper airway cough syndrome 09/06/2018   Onset Nov 2019 with uri  - Sinus CT 56/12/2018 neg    Visual disturbance 11/02/2021   Vitamin D  deficiency    Von Willebrand disease    Past Surgical History:  Procedure Laterality Date   NO PAST SURGERIES     Patient Active Problem List    Diagnosis Date Noted   Neurocognitive disorder 02/01/2023   Mood disorder (HCC) 02/01/2023   Neck pain 10/10/2022   Memory loss 05/30/2022   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   Allergic rhinitis    Cervical radiculopathy at C6    Chronic fatigue    Psychogenic nonepileptic seizure    Iron deficiency anemia    Neuritis    Restless legs syndrome    Vitamin D  deficiency    Cheilitis    Mixed hyperlipidemia 11/04/2021   Visual disturbance 11/02/2021   Low back pain 09/08/2021   History of COVID-19 02/05/2020   Right sided weakness 01/10/2020   Left shoulder pain 10/31/2019   Leukopenia 09/15/2019   PTSD (post-traumatic stress disorder) 09/15/2019   History of chicken pox 09/15/2019   Von Willebrand disease    Chronic obstructive pulmonary disease 09/07/2018   Upper airway cough syndrome 09/06/2018   Fibromyalgia 06/01/2015   Insomnia 06/01/2015   History of syncope    Palpitations    Mitral valve prolapse     PCP: Domenica Harlene LABOR, MD  REFERRING PROVIDER: Domenica Harlene LABOR, MD  REFERRING DIAG: M54.2 (ICD-10-CM) -  Neck pain M54.50 (ICD-10-CM) - Low back pain, unspecified back pain laterality, unspecified chronicity, unspecified whether sciatica present  THERAPY DIAG:  Cervicalgia  Other low back pain  Cramp and spasm  Other abnormalities of gait and mobility  Muscle weakness (generalized)  Rationale for Evaluation and Treatment: Rehabilitation  ONSET DATE: 7-8 years ago, but pain worsened after fall earlier this year  SUBJECTIVE:                                                                                                                                                                                                         SUBJECTIVE STATEMENT: Patient reports she feels good today. She has a massage yesterday and that felt good.   From Eval: Patient reports that she has had pain for at least 7-8 years, intermittently.  She  does state that she has had syncope in the past, but unrelated to fall 2-3 months ago. Pt reports being outside recently, doing yard work when it started raining, pt ran and slipped   Hand dominance: Right Patient's husband, Koren, present throughout  PERTINENT HISTORY:  prodromal Alzheimer's, Fibromyalgia, Cervical Radiculopathy, Anxiety, depression, PTSD, insomnia  PAIN:  Are you having pain? Yes: NPRS scale: 10/10 time of incident; currently 5/10 Pain location: Rt side body - now Lt side of body reported on 8/5 appt (had a full work up at hospital this past weekend and nothing signif found) Pain description: Sharp, achy Aggravating factors: First waking in the morning Relieving factors: Unknown  PRECAUTIONS: None  RED FLAGS: None     WEIGHT BEARING RESTRICTIONS: No  FALLS:  Has patient fallen in last 6 months? Yes. Number of falls 1 where she slipped outside on wet leaves  LIVING ENVIRONMENT: Lives with: lives with their spouse Lives in: House/apartment Stairs: 2 story home Has following equipment at home: None  OCCUPATION: Retired   PLOF: Independent and Leisure: Gardening, being outside, yard work   PATIENT GOALS: Pt would like to be able to get back to doing yard work  NEXT MD VISIT: Every 6 months  OBJECTIVE:  Note: Objective measures were completed at Evaluation unless otherwise noted.  DIAGNOSTIC FINDINGS:  Lumbar radiograph on 09/10/2021 IMPRESSION: 1. Mild broad-based dextroscoliotic curvature. 2. Mild L4-L5 disc space narrowing. 3. Minor L5-S1 facet hypertrophy.  PATIENT SURVEYS:  LEFS  Extreme difficulty/unable (0), Quite a bit of difficulty (1), Moderate difficulty (2), Little difficulty (3), No difficulty (4) Survey date:  10/25/23  Any of your usual work, housework or school activities 2  2. Usual hobbies, recreational or sporting activities  1  3. Getting into/out of the bath 2  4. Walking between rooms 3  5. Putting on socks/shoes 3  6.  Squatting  2  7. Lifting an object, like a bag of groceries from the floor 1  8. Performing light activities around your home 3  9. Performing heavy activities around your home 0  10. Getting into/out of a car 3  11. Walking 2 blocks 3  12. Walking 1 mile 0  13. Going up/down 10 stairs (1 flight) 2  14. Standing for 1 hour 0  15.  sitting for 1 hour 3  16. Running on even ground 0  17. Running on uneven ground 0  18. Making sharp turns while running fast 0  19. Hopping  0  20. Rolling over in bed 3  Score total:  31/80 = 38.75%     COGNITION: Overall cognitive status: History of cognitive impairments - at baseline  SENSATION: Pt reports tingling in hands  POSTURE: rounded shoulders and forward head  PALPATION: Tightness in bilateral upper traps and lumbar paraspinals.  Crepitus noted in Rt scapula  CERVICAL ROM:   Active ROM *=pain A/ROM (deg) eval  Flexion 50*  Extension 45*  Right lateral flexion 25*  Left lateral flexion 30*  Right rotation 52*  Left rotation 55*   (Blank rows = not tested)  UPPER EXTREMITY ROM: Eval: WNL with some pain at end ROM on Rt shoulder    LOWER/UPPER EXTREMITY MMT:  Eval:   Right shoulder strength grossly 4-to 4/5 Left shoulder flexion is 5-/5, left shoulder abduction is 4/5 Bilateral LE strength of 4- to 4/5 grossly throughout   FUNCTIONAL TESTS:  Eval: 5 times sit to stand: 30.77 sec with hands pressing up on thighs, with pain  Timed up and go (TUG): 14.69 sec  11/21/2023 473ft RPE 5/10  TREATMENT DATE:  11/21/23 NuStep L5 x7' - PT present to discuss status Seated LAQ 2# 2 x 10 bilateral  464ft RPE 5/10 Standing shoulder flexion & scaption 2# x 10 each Standing chest press 2# x 10 Sit to stand 2 x 5 holding 4# DB Seated clam with red loop 2 x 10 Seated TA activation with ball squeeze 2 x 10   11/14/23 NuStep L3 x7' - PT present to discuss status Standing shoulder row with TB 2 x 10 Standing shoulder  flexion & scaption 2# x 10 each Seated TA activation with ball squeeze 10x5 Seated LAQ 2# 2 x 10 bilateral  Sit to stand 3 x 5 holding 4# DB Seated clam with red loop 2 x 10 Sit to stand 3 x 5 holding 4# DB  10/26/2023 NuStep Level 3 6 mins- PT present to discuss status Seated scapular retraction  x 10 Seated TA activation with ball squeeze 2 x 10 TA activation + hip adduction ball squeeze x 20 Seated LAQ 2# AW 2 x 10 bilateral  Standing shoulder row with red TB 2 x 10 Sit to stand 3 x 5 holding 4# DB Standing shoulder flexion & scaption 2# x 10 each Seated clam with red loop 2 x 10 Standing shoulder row with TB 2 x 10 Manual : STM to bilateral upper trap for improved muscle elongation and decreased pain   10/26/2023 NuStep Level 3 7 mins- PT present to discuss status Seated scapular retraction  x 10 Seated TA activation with ball squeeze 2 x 10 Hamstring stretch at stair 2 x 30 sec bilateral  Sit to stand 2 x  5 Standing shoulder row & extension with red TB 2 x 10 Standing shoulder flexion & scaption 2# x 10 each Seated LAQ 2# x 10 bilateral  Seated stability ball stretch (forwards) x 10                                                                                                                            PATIENT EDUCATION:  Education details: Issued HEP Person educated: Patient and Spouse Education method: Explanation, Demonstration, and Handouts Education comprehension: verbalized understanding and returned demonstration  HOME EXERCISE PROGRAM: Access Code: [email or web address] URL: [email or web address] Date: 10/25/2023 Prepared by: Jarrell Menke  Exercises - Seated Scapular Retraction  - 1 x daily - 7 x weekly - 2 sets - 10 reps - Seated Transversus Abdominis Bracing  - 1 x daily - 7 x weekly - 2 sets - 10 reps - Seated Hamstring Stretch  - 1 x daily - 7 x weekly - 3 sets - 30 sec hold - Sit to Stand  - 1 x daily - 7 x weekly - 3 sets - 5  reps  ASSESSMENT:  CLINICAL IMPRESSION: Jimia presents to therapy with no new complaints. She got a massage and that felt really good. She was able to participate in a today with an RPE of 5/10. Patient frequently gets frustrated with her current functional level. Encouraged patient that she is progressing well and muscle strengthening takes time. Patient required frequent verbal cues for upright seated and standing posture. Patient will benefit from skilled PT to address the below impairments and improve overall function.    OBJECTIVE IMPAIRMENTS: Abnormal gait, decreased balance, decreased cognition, decreased coordination, difficulty walking, decreased strength, increased muscle spasms, impaired flexibility, postural dysfunction, and pain.   ACTIVITY LIMITATIONS: carrying, lifting, bending, sitting, standing, squatting, and stairs  PARTICIPATION LIMITATIONS: community activity and yard work  PERSONAL FACTORS: Time since onset of injury/illness/exacerbation and 3+ comorbidities: Alzheimer's, Fibromyalgia, Cervical radiculopathy are also affecting patient's functional outcome.   REHAB POTENTIAL: Good  CLINICAL DECISION MAKING: Evolving/moderate complexity  EVALUATION COMPLEXITY: Moderate   GOALS: Goals reviewed with patient? Yes  SHORT TERM GOALS: Target date: 11/17/2023  Patient and spouse will be independent with initial HEP. Baseline:  Goal status: ongoing 8/5  2.  Patient will be able to participate in 3/6 minute walk test to establish a baseline measurement. Baseline:  Goal status: Met 11/21/2023   LONG TERM GOALS: Target date: 12/15/2023  Patient and spouse will be independent with advanced HEP. Baseline:  Goal status: INITIAL  2.  Patient will be able to ambulate for at least 10-15 min without increased pain to allow for community reintegration. Baseline:  Goal status: INITIAL  3.  Patient will improve 5 times sit to/from stand to 20 sec or less to  demonstrate improved functional strength. Baseline: 30.77 sec Goal status: INITIAL  4.  Patient to report ability to being able to perform her gardening and desired activities in the yard without increased  pain. Baseline:  Goal status: INITIAL  5.  Patient to report no new falls within 2 weeks of discharge. Baseline:  Goal status: INITIAL    PLAN:  PT FREQUENCY: 1-2x/week  PT DURATION: 8 weeks  PLANNED INTERVENTIONS: 97164- PT Re-evaluation, 97750- Physical Performance Testing, 97110-Therapeutic exercises, 97530- Therapeutic activity, 97112- Neuromuscular re-education, 97535- Self Care, 02859- Manual therapy, [phone]- Gait training, [phone]- Canalith repositioning, [phone]- Aquatic Therapy, [phone]- Electrical stimulation (unattended), [phone]- Electrical stimulation (manual), [id number]- Vasopneumatic device, [id number]- Ultrasound, [id number]- Traction (mechanical), [id number]- Ionotophoresis 4mg /ml Dexamethasone, 20560 (1-2 muscles), 20561 (3+ muscles)- Dry Needling, Patient/Family education, Balance training, Stair training, Taping, Joint mobilization, Joint manipulation, Spinal manipulation, Spinal mobilization, Cryotherapy, and Moist heat  PLAN FOR NEXT SESSION:  standing tolerance; continue core & functional strengthening, flexibility, manual/dry needling as indicated      Kristeen Sar, PT 11/21/23 5:07 PM [facility] [address], [address] [place], [place] [address] Phone # [phone] Fax [phone]  PHYSICAL THERAPY DISCHARGE SUMMARY  Visits from Start of Care: 5  Current functional level related to goals / functional outcomes: Patient left a voicemail stating that she doesn't think physical therapy is going to help her. She got a massage and that really felt good.   Patient agrees to discharge. Patient goals were partially met. Patient is being discharged due to the patient's request.   Kristeen Sar, PT 11/22/23 1:58 PM

## 2023-11-23 ENCOUNTER — Ambulatory Visit: Admitting: Physical Therapy

## 2023-11-28 ENCOUNTER — Encounter: Admitting: Rehabilitative and Restorative Service Providers"

## 2023-11-30 ENCOUNTER — Encounter: Admitting: Physical Therapy

## 2023-12-02 ENCOUNTER — Encounter: Payer: Self-pay | Admitting: Family Medicine

## 2023-12-05 ENCOUNTER — Encounter: Admitting: Physical Therapy

## 2023-12-07 ENCOUNTER — Encounter: Admitting: Rehabilitative and Restorative Service Providers"

## 2023-12-12 ENCOUNTER — Encounter: Admitting: Rehabilitative and Restorative Service Providers"

## 2023-12-13 ENCOUNTER — Encounter: Payer: Self-pay | Admitting: Physician Assistant

## 2023-12-13 ENCOUNTER — Ambulatory Visit (INDEPENDENT_AMBULATORY_CARE_PROVIDER_SITE_OTHER): Admitting: Physician Assistant

## 2023-12-13 VITALS — BP 90/62 | HR 68 | Ht 65.0 in | Wt 124.4 lb

## 2023-12-13 DIAGNOSIS — K644 Residual hemorrhoidal skin tags: Secondary | ICD-10-CM

## 2023-12-13 DIAGNOSIS — K640 First degree hemorrhoids: Secondary | ICD-10-CM

## 2023-12-13 DIAGNOSIS — K5909 Other constipation: Secondary | ICD-10-CM

## 2023-12-13 MED ORDER — HYDROCORTISONE (PERIANAL) 2.5 % EX CREA: 1.0000 | TOPICAL_CREAM | Freq: Two times a day (BID) | CUTANEOUS | 1 refills | Status: AC

## 2023-12-13 NOTE — Progress Notes (Signed)
 Chief Complaint: Hemorrhoids  HPI:    Dawn Simmons is a 78 year old Caucasian female with a past medical history as listed below including COPD and multiple others, who was referred to me by Domenica Harlene LABOR, MD for a complaint of hemorrhoids.      01/08/2023 Cologuard negative    09/28/2023 patient seen by PCP for right sided pain and hemorrhoids.  Apparently these had developed after a fall and persisted for 2 months.  Had tried Tucks pads and ointments.  Discussed history of constipation.    11/09/2023 CBC with a hemoglobin normal at 12.7.  BMP with a creatinine of 1.01 and otherwise normal.  X-rays of the right knee and right shoulder were normal.  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    11/09/2023 patient seen in the ER for right sided pain following a fall in April.  At that time discussed PCP and referred to physical therapy.  Had done 3 physical therapy sessions without relief.    11/29/2023 patient contacted PCP in regards to episodes of syncope lasting around 30 seconds to a minute and a half.    Today, the patient presents to clinic accompanied by her husband who does assist with her care given her memory issues.  They explain that she had a fall on the steps back in the spring of this year and after that episode actually came in and vomited quite a bit in the sink and since then has been bothered by hemorrhoids.  She does have chronic constipation telling me that often her stools are hard to pass.  She has tried occasional stool softeners which do not really help per her.  This was a recent addition.  Her hemorrhoids seem to bother her off-and-on, she has tried Tucks pads and over-the-counter ointments which really do not make a difference.  Today they happen to be more noticeable than usual with some itching.  She feels things back there.    Denies fever, chills, weight loss or blood in her stool.  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   Benzodiazepine withdrawal without complication    Cervical radiculopathy at C6    Cheilitis    Chronic fatigue    Chronic obstructive pulmonary disease 09/07/2018   Quit smoking 1974 with copd changes on cxr but no limiting sob as of 09/06/2018  - 09/06/2018   Walked RA  2 laps @  approx 224ft each @ fast pace  stopped due to  End of study, no sob and sats  100% at end   Fibromyalgia 06/01/2015   History of chicken pox 09/15/2019   History of COVID-19 02/05/2020   History of measles    History of mumps    History of rubella    History of syncope    Insomnia 06/01/2015   Iron deficiency anemia    Left shoulder pain 10/31/2019   Leukopenia 09/15/2019   Low back pain 09/08/2021   Mitral valve prolapse    Mixed hyperlipidemia 11/04/2021   Neuritis    Palpitations    Pharyngitis    Psychogenic nonepileptic seizure    PTSD (post-traumatic stress disorder) 09/15/2019   Restless legs syndrome    Right sided weakness 01/10/2020   Upper airway cough syndrome 09/06/2018   Onset Nov 2019 with uri  - Sinus CT 56/12/2018 neg    Visual  disturbance 11/02/2021   Vitamin D  deficiency    Von Willebrand disease     Past Surgical History:  Procedure Laterality Date   NO PAST SURGERIES      Current Outpatient Medications  Medication Sig Dispense Refill   Ascorbic Acid (VITAMIN C) 100 MG tablet Take 100 mg by mouth daily.     Calcium-Magnesium 200-100 MG TABS Take 4 tablets by mouth daily. 2 tablets in the morning and 2 tablets in the evening     diazepam  (VALIUM ) 5 MG tablet Take 1/4-1/2 tablet po QHS 45 tablet 2   ergocalciferol  (DRISDOL) 200 MCG/ML drops      escitalopram  (LEXAPRO ) 10 MG tablet Take 1 tablet (10 mg total) by mouth daily. 90 tablet 0   Multiple Vitamin (MULTIVITAMIN) capsule Take 1 capsule by mouth daily.     Omega-3 1000 MG CAPS Take by mouth.     TURMERIC PO Take by mouth.  (Patient not taking: Reported on 10/25/2023)     No current facility-administered medications for this visit.    Allergies as of 12/13/2023 - Review Complete 11/21/2023  Allergen Reaction Noted   Aricept  [donepezil  hcl] Other (See Comments) 07/12/2022   Doxycycline Swelling 04/28/2015   Tetanus toxoids Other (See Comments) 04/28/2015   Flexeril [cyclobenzaprine] Other (See Comments) 04/28/2015    Family History  Problem Relation Age of Onset   Stroke Mother    Dementia Mother        with advanced age   Emphysema Father        smoked   Alcohol abuse Father    Heart failure Maternal Grandmother    Heart attack Maternal Grandfather    Heart failure Paternal Grandmother    Heart attack Paternal Grandfather    OCD Son    Drug abuse Son    Heart attack Maternal Uncle    Seizures Neg Hx     Social History   Socioeconomic History   Marital status: Married    Spouse name: tony   Number of children: 4   Years of education: 12   Highest education level: High school graduate  Occupational History   Occupation: Retired  Tobacco Use   Smoking status: Former    Current packs/day: 0.00    Average packs/day: 0.5 packs/day for 10.0 years (5.0 ttl pk-yrs)    Types: Cigarettes    Start date: 04/27/1962    Quit date: 04/27/1972    Years since quitting: 51.6   Smokeless tobacco: Never  Vaping Use   Vaping status: Never Used  Substance and Sexual Activity   Alcohol use: Yes    Alcohol/week: 0.0 standard drinks of alcohol    Comment: occ 1 glass wine or beer   Drug use: No   Sexual activity: Not on file  Other Topics Concern   Not on file  Social History Narrative   Lives at home with husband   Caffeine use- tea, 1 cup/day   Social Drivers of Corporate investment banker Strain: Low Risk  (06/20/2023)   Overall Financial Resource Strain (CARDIA)    Difficulty of Paying Living Expenses: Not hard at all  Food Insecurity: No Food Insecurity (06/20/2023)   Hunger Vital Sign     Worried About Running Out of Food in the Last Year: Never true    Ran Out of Food in the Last Year: Never true  Transportation Needs: No Transportation Needs (06/20/2023)   PRAPARE - Administrator, Civil Service (Medical): No  Lack of Transportation (Non-Medical): No  Physical Activity: Insufficiently Active (06/20/2023)   Exercise Vital Sign    Days of Exercise per Week: 4 days    Minutes of Exercise per Session: 30 min  Stress: No Stress Concern Present (06/20/2023)   [employer] of Occupational Health - Occupational Stress Questionnaire    Feeling of Stress : Not at all  Social Connections: Socially Integrated (06/20/2023)   Social Connection and Isolation Panel    Frequency of Communication with Friends and Family: More than three times a week    Frequency of Social Gatherings with Friends and Family: More than three times a week    Attends Religious Services: More than 4 times per year    Active Member of [employer] or Organizations: Yes    Attends Engineer, structural: More than 4 times per year    Marital Status: Married  Catering manager Violence: Not At Risk (06/20/2023)   Humiliation, Afraid, Rape, and Kick questionnaire    Fear of Current or Ex-Partner: No    Emotionally Abused: No    Physically Abused: No    Sexually Abused: No    Review of Systems:    Constitutional: No weight loss, fever or chills Skin: No rash Cardiovascular: No chest pain Respiratory: No SOB Gastrointestinal: See HPI and otherwise negative Genitourinary: No dysuria  Neurological: No headache, dizziness or syncope Musculoskeletal: No new muscle or joint pain Hematologic: No bleeding  Psychiatric: No history of depression or anxiety   Physical Exam:  Vital signs: BP 90/62   Pulse 68   Ht 5' 5 (1.651 m)   Wt 124 lb 6.4 oz (56.4 kg)   BMI 20.70 kg/m    Constitutional:   Pleasant elderly Caucasian female appears to be in NAD, Well developed, Well nourished, alert and  cooperative Head:  Normocephalic and atraumatic. Eyes:   PEERL, EOMI. No icterus. Conjunctiva pink. Ears:  Normal auditory acuity. Neck:  Supple Throat: Oral cavity and pharynx without inflammation, swelling or lesion.  Respiratory: Respirations even and unlabored. Lungs clear to auscultation bilaterally.   No wheezes, crackles, or rhonchi.  Cardiovascular: Normal S1, S2. No MRG. Regular rate and rhythm. No peripheral edema, cyanosis or pallor.  Gastrointestinal:  Soft, nondistended, nontender. No rebound or guarding.  Decreased bowel sounds all 4 quadrants. No appreciable masses or hepatomegaly. Rectal: External: 2 external hemorrhoids, nonthrombosed, non tender; internal: No residue, some discomfort per patient; and anoscopy: Grade 1 hemorrhoid in the left lateral position Msk:  Symmetrical without gross deformities. Without edema, no deformity or joint abnormality.  Neurologic:  Alert and  oriented x4;  grossly normal neurologically.  Skin:   Dry and intact without significant lesions or rashes. Psychiatric:  Demonstrates good judgement and reason without abnormal affect or behaviors.+ Some memory deficits, repeating things multiple times throughout the visit  Most recent LABS: CBC    Component Value Date/Time   WBC 4.0 11/09/2023 1149   RBC 4.09 11/09/2023 1149   HGB 12.7 11/09/2023 1149   HCT 37.3 11/09/2023 1149   PLT 250 11/09/2023 1149   MCV 91.2 11/09/2023 1149   MCH 31.1 11/09/2023 1149   MCHC 34.0 11/09/2023 1149   RDW 13.3 11/09/2023 1149   LYMPHSABS 1.4 11/09/2023 1149   MONOABS 0.4 11/09/2023 1149   EOSABS 0.0 11/09/2023 1149   BASOSABS 0.0 11/09/2023 1149    CMP     Component Value Date/Time   NA 141 11/09/2023 1149   K 3.7 11/09/2023 1149   CL  106 11/09/2023 1149   CO2 20 (L) 11/09/2023 1149   GLUCOSE 95 11/09/2023 1149   BUN 14 11/09/2023 1149   CREATININE 1.01 (H) 11/09/2023 1149   CREATININE 0.97 (H) 01/10/2020 1358   CALCIUM 9.9 11/09/2023 1149   PROT  6.5 11/28/2022 1308   ALBUMIN 4.2 11/28/2022 1308   AST 24 11/28/2022 1308   ALT 19 11/28/2022 1308   ALKPHOS 62 11/28/2022 1308   BILITOT 0.6 11/28/2022 1308   GFRNONAA 57 (L) 11/09/2023 1149    Assessment: 1.  External and internal hemorrhoids: Seen at time of exam today, nonthrombosed, the primary symptom from her external hemorrhoids is itching, has tried over-the-counter products to no avail 2.  Chronic constipation: Patient has tried a stool softener occasionally but nothing consistently, often has hard stools, she thought this was from blockage of the exit by hemorrhoids, but they are not causing any mechanical obstruction, more than likely this is slow transit  Plan: 1.  Prescribed Hydrocortisone  ointment 2.5% to be applied twice daily x 7 days and repeat x 1 week and then as needed 2.  Discussed constipation, recommend MiraLAX daily titrate up to 4 times a day as needed to keep regular soft solid bowel movements. 3.  Patient to follow in clinic with me in 2 to 3 months.  Signed to Dr. Shila today.  Delon Failing, PA-C [facility] 12/13/2023, 8:36 AM  Cc: Domenica Harlene LABOR, MD

## 2023-12-13 NOTE — Patient Instructions (Signed)
 Start Miralax 1 capful daily in 8 ounces of liquid.  We have sent the following medications to your pharmacy for you to pick up at your convenience: Hydrocortisone  cream 2.5 % ointment twice daily for 7 days, may repeat as needed.   _______________________________________________________  If your blood pressure at your visit was 140/90 or greater, please contact your primary care physician to follow up on this.  _______________________________________________________  If you are age 77 or older, your body mass index should be between 23-30. Your Body mass index is 20.7 kg/m. If this is out of the aforementioned range listed, please consider follow up with your Primary Care Provider.  If you are age 86 or younger, your body mass index should be between 19-25. Your Body mass index is 20.7 kg/m. If this is out of the aformentioned range listed, please consider follow up with your Primary Care Provider.   ________________________________________________________  The [facility] providers would like to encourage you to use MYCHART to communicate with providers for non-urgent requests or questions.  Due to long hold times on the telephone, sending your provider a message by [facility] may be a faster and more efficient way to get a response.  Please allow 48 business hours for a response.  Please remember that this is for non-urgent requests.  _______________________________________________________  [facility] is using a team-based approach to care.  Your team is made up of your doctor and two to three APPS. Our APPS (Nurse Practitioners and Physician Assistants) work with your physician to ensure care continuity for you. They are fully qualified to address your health concerns and develop a treatment plan. They communicate directly with your gastroenterologist to care for you. Seeing the Advanced Practice Practitioners on your physician's team can help you by facilitating care more  promptly, often allowing for earlier appointments, access to diagnostic testing, procedures, and other specialty referrals.

## 2023-12-14 ENCOUNTER — Other Ambulatory Visit: Payer: Self-pay | Admitting: Family Medicine

## 2023-12-14 ENCOUNTER — Encounter: Admitting: Rehabilitative and Restorative Service Providers"

## 2023-12-14 DIAGNOSIS — F32A Depression, unspecified: Secondary | ICD-10-CM

## 2023-12-14 DIAGNOSIS — F4310 Post-traumatic stress disorder, unspecified: Secondary | ICD-10-CM

## 2023-12-31 NOTE — Assessment & Plan Note (Signed)
 Stable but continues to struggle some

## 2023-12-31 NOTE — Assessment & Plan Note (Signed)
 Encourage heart healthy diet such as MIND or DASH diet, increase exercise, avoid trans fats, simple carbohydrates and processed foods, consider a krill or fish or flaxseed oil cap daily.

## 2023-12-31 NOTE — Assessment & Plan Note (Signed)
 Is frustrated by her progressive memory loss

## 2024-01-04 ENCOUNTER — Encounter: Payer: Self-pay | Admitting: Family Medicine

## 2024-01-04 ENCOUNTER — Ambulatory Visit: Admitting: Family Medicine

## 2024-01-04 VITALS — BP 118/70 | HR 70 | Temp 97.6°F | Resp 16 | Ht 65.0 in | Wt 126.4 lb

## 2024-01-04 DIAGNOSIS — F39 Unspecified mood [affective] disorder: Secondary | ICD-10-CM | POA: Diagnosis not present

## 2024-01-04 DIAGNOSIS — Z23 Encounter for immunization: Secondary | ICD-10-CM

## 2024-01-04 DIAGNOSIS — E782 Mixed hyperlipidemia: Secondary | ICD-10-CM | POA: Diagnosis not present

## 2024-01-04 DIAGNOSIS — E559 Vitamin D deficiency, unspecified: Secondary | ICD-10-CM

## 2024-01-04 DIAGNOSIS — R419 Unspecified symptoms and signs involving cognitive functions and awareness: Secondary | ICD-10-CM | POA: Diagnosis not present

## 2024-01-04 MED ORDER — TIZANIDINE HCL 2 MG PO TABS: 1.0000 mg | ORAL_TABLET | Freq: Three times a day (TID) | ORAL | 1 refills | Status: AC | PRN

## 2024-01-04 NOTE — Patient Instructions (Addendum)
 Tylenol/Acetaminophen ES 500 mg tabs, 2 tabs up to three x a day as needed for pain, max of 3000 mg in 24 hours  Zevo for mosquitos Lemon eucalyptus   Tetanus update RSV, Respiratory Syncitial Virus Vaccine, Arexvy vaccine pharmacy0  Call if ready for a referral to Sports Med Chronic Back Pain Chronic back pain is back pain that lasts longer than 3 months. The cause of your back pain may not be known. Some common causes include: Wear and tear (degenerative disease) of the bones, disks, or tissues that connect bones to each other (ligaments) in your back. Inflammation and stiffness in your back (arthritis). If you have chronic back pain, you may have times when the pain is more intense (flare-ups). You can also learn to manage the pain with home care. Follow these instructions at home: Watch for any changes in your symptoms. Take these actions to help with your pain: Managing pain and stiffness     If told, put ice on the painful area. You may be told to apply ice for the first 24-48 hours after a flare-up starts. Put ice in a plastic bag. Place a towel between your skin and the bag. Leave the ice on for 20 minutes, 2-3 times per day. If told, apply heat to the affected area as often as told by your health care provider. Use the heat source that your provider recommends, such as a moist heat pack or a heating pad. Place a towel between your skin and the heat source. Leave the heat on for 20-30 minutes. If your skin turns bright red, remove the ice or heat right away to prevent skin damage. The risk of damage is higher if you cannot feel pain, heat, or cold. Try soaking in a warm tub. Activity        Avoid bending and other activities that make the pain worse. Have good posture when you stand or sit. When you stand, keep your upper back and neck straight, with your shoulders pulled back. Avoid slouching. When you sit, keep your back straight. Relax your shoulders. Do not round  your shoulders or pull them backward. Do not sit or stand in one place for too long. Take brief periods of rest during the day. This will reduce your pain. Resting in a lying or standing position is often better than sitting to rest. When you rest for longer periods, mix in some mild activity or stretching between periods of rest. This will help to prevent stiffness and pain. Get regular exercise. Ask your provider what activities are safe for you. You may have to avoid lifting. Ask your provider how much you can safely lift. If you do lift, always use the right technique. This means you should: Bend your knees. Keep the load close to your body. Avoid twisting. Medicines Take over-the-counter and prescription medicines only as told by your provider. You may need to take medicines for pain and inflammation. These may be taken by mouth or put on the skin. You may also be given muscle relaxants. Ask your provider if the medicine prescribed to you: Requires you to avoid driving or using machinery. Can cause constipation. You may need to take these actions to prevent or treat constipation: Drink enough fluid to keep your pee (urine) pale yellow. Take over-the-counter or prescription medicines. Eat foods that are high in fiber, such as beans, whole grains, and fresh fruits and vegetables. Limit foods that are high in fat and processed sugars, such as fried or  sweet foods. General instructions  Sleep on a firm mattress in a comfortable position. Try lying on your side with your knees slightly bent. If you lie on your back, put a pillow under your knees. Do not use any products that contain nicotine or tobacco. These products include cigarettes, chewing tobacco, and vaping devices, such as e-cigarettes. If you need help quitting, ask your provider. Contact a health care provider if: You have pain that does not get better with rest or medicine. You have new pain. You have a fever. You lose weight  quickly. You have trouble doing your normal activities. You feel weak or numb in one or both of your legs or feet. Get help right away if: You are not able to control when you pee or poop. You have severe back pain and: Nausea or vomiting. Pain in your chest or abdomen. Shortness of breath. You faint. These symptoms may be an emergency. Get help right away. Call 911. Do not wait to see if the symptoms will go away. Do not drive yourself to the hospital. This information is not intended to replace advice given to you by your health care provider. Make sure you discuss any questions you have with your health care provider. Document Revised: 11/15/2021 Document Reviewed: 11/15/2021 Elsevier Patient Education  2024 [employer].

## 2024-01-05 LAB — COMPREHENSIVE METABOLIC PANEL WITH GFR
ALT: 14 U/L (ref 0–35)
AST: 21 U/L (ref 0–37)
Albumin: 4.4 g/dL (ref 3.5–5.2)
Alkaline Phosphatase: 55 U/L (ref 39–117)
BUN: 15 mg/dL (ref 6–23)
CO2: 24 meq/L (ref 19–32)
Calcium: 9.5 mg/dL (ref 8.4–10.5)
Chloride: 108 meq/L (ref 96–112)
Creatinine, Ser: 0.9 mg/dL (ref 0.40–1.20)
GFR: 61.85 mL/min (ref >60.00)
Glucose, Bld: 89 mg/dL (ref 70–99)
Potassium: 4.4 meq/L (ref 3.5–5.1)
Sodium: 140 meq/L (ref 135–145)
Total Bilirubin: 0.5 mg/dL (ref 0.2–1.2)
Total Protein: 6.4 g/dL (ref 6.0–8.3)

## 2024-01-05 LAB — CBC WITH DIFFERENTIAL/PLATELET
Basophils Absolute: 0 K/uL (ref 0.0–0.1)
Basophils Relative: 0.2 % (ref 0.0–3.0)
Eosinophils Absolute: 0.1 K/uL (ref 0.0–0.7)
Eosinophils Relative: 1.5 % (ref 0.0–5.0)
HCT: 37.2 % (ref 36.0–46.0)
Hemoglobin: 12.6 g/dL (ref 12.0–15.0)
Lymphocytes Relative: 31.9 % (ref 12.0–46.0)
Lymphs Abs: 1.4 K/uL (ref 0.7–4.0)
MCHC: 33.9 g/dL (ref 30.0–36.0)
MCV: 93.2 fl (ref 78.0–100.0)
Monocytes Absolute: 0.4 K/uL (ref 0.1–1.0)
Monocytes Relative: 9.1 % (ref 3.0–12.0)
Neutro Abs: 2.4 K/uL (ref 1.4–7.7)
Neutrophils Relative %: 57.3 % (ref 43.0–77.0)
Platelets: 260 K/uL (ref 150.0–400.0)
RBC: 3.99 Mil/uL (ref 3.87–5.11)
RDW: 14.1 % (ref 11.5–15.5)
WBC: 4.2 K/uL (ref 4.0–10.5)

## 2024-01-05 LAB — VITAMIN D 25 HYDROXY (VIT D DEFICIENCY, FRACTURES): VITD: 36.64 ng/mL (ref 30.00–100.00)

## 2024-01-05 LAB — LIPID PANEL
Cholesterol: 219 mg/dL — ABNORMAL HIGH (ref 0–200)
HDL: 56.6 mg/dL (ref >39.00)
LDL Cholesterol: 126 mg/dL — ABNORMAL HIGH (ref 0–99)
NonHDL: 162.43
Total CHOL/HDL Ratio: 4
Triglycerides: 184 mg/dL — ABNORMAL HIGH (ref 0.0–149.0)
VLDL: 36.8 mg/dL (ref 0.0–40.0)

## 2024-01-05 LAB — TSH: TSH: 1.69 u[IU]/mL (ref 0.35–5.50)

## 2024-01-07 ENCOUNTER — Ambulatory Visit: Payer: Self-pay | Admitting: Family Medicine

## 2024-01-07 ENCOUNTER — Encounter: Payer: Self-pay | Admitting: Family Medicine

## 2024-01-07 NOTE — Progress Notes (Signed)
 Subjective:    Patient ID: Dawn Simmons, female    DOB: 26-Feb-1946, 78 y.o.   MRN: [id number]  Chief Complaint  Patient presents with   Medical Management of Chronic Issues    Patient presents today for a 3 month follow-up   Quality Metric Gaps    TDAP, zoster    HPI Discussed the use of AI scribe software for clinical note transcription with the patient, who gave verbal consent to proceed.  History of Present Illness Dawn Simmons is a 78 year old female who presents with low back pain and right-sided pain following a fall. She is accompanied by her caregiver during the encounter.  She has been experiencing low back pain that began a few days ago after bending to pick dead leaves off a gardenia bush. The pain is described as 'locking up' her back. She manages the pain with Tylenol and Advil , taking two of each three times a day, and occasionally uses diazepam  to aid sleep. This regimen allows her to sleep through the night. Gabapentin  and ibuprofen  administered in the emergency room did not provide significant relief.  Several months ago, she fell on her right side on hard wooden steps leading to her deck while trying to escape the rain. This incident resulted in persistent right-sided pain that has not improved significantly over time. Physical therapy worsened her condition, leading her to discontinue it, while massage therapy provided temporary relief. The pain is intermittent, varies in intensity and location, and is activity-dependent, exacerbated by certain movements.  She reports the presence of bruises on her legs that 'come and go' without any apparent cause. These bruises are large and brown, not particularly tender unless pressed hard. She has a history of easy bruising, which she attributes to her 'thin, pale skin.' No recent falls or injuries that could account for the bruising. She has a family history of [place] descent, which she associates with her tendency to bruise  easily.  She mentions difficulty with memory and concentration since her fall, which she attributes to the incident. She experiences difficulty sleeping during the day and has a history of living with an alcoholic father, which has impacted her sleep patterns and stress levels.    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   Benzodiazepine withdrawal without complication    Cervical radiculopathy at C6    Cheilitis    Chronic fatigue    Chronic obstructive pulmonary disease 09/07/2018   Quit smoking 1974 with copd changes on cxr but no limiting sob as of 09/06/2018  - 09/06/2018   Walked RA  2 laps @  approx 289ft each @ fast pace  stopped due to  End of study, no sob and sats  100% at end   Fibromyalgia 06/01/2015   History of chicken pox 09/15/2019   History of COVID-19 02/05/2020   History of measles    History of mumps    History of rubella    History of syncope    Insomnia 06/01/2015   Iron deficiency anemia    Left shoulder pain 10/31/2019   Leukopenia 09/15/2019   Low back pain 09/08/2021   Mitral valve prolapse    Mixed hyperlipidemia 11/04/2021   Neuritis    Palpitations    Pharyngitis    Psychogenic nonepileptic seizure    PTSD (post-traumatic stress disorder) 09/15/2019   Restless legs syndrome    Right sided weakness 01/10/2020  Upper airway cough syndrome 09/06/2018   Onset Nov 2019 with uri  - Sinus CT 56/12/2018 neg    Visual disturbance 11/02/2021   Vitamin D  deficiency    Von Willebrand disease     Past Surgical History:  Procedure Laterality Date   NO PAST SURGERIES      Family History  Problem Relation Age of Onset   Stroke Mother    Dementia Mother        with advanced age   Emphysema Father        smoked   Alcohol abuse Father    Heart failure Maternal Grandmother    Heart attack Maternal Grandfather    Heart failure Paternal Grandmother    Heart  attack Paternal Grandfather    OCD Son    Drug abuse Son    Heart attack Maternal Uncle    Seizures Neg Hx     Social History   Socioeconomic History   Marital status: Married    Spouse name: tony   Number of children: 4   Years of education: 12   Highest education level: High school graduate  Occupational History   Occupation: Retired  Tobacco Use   Smoking status: Former    Current packs/day: 0.00    Average packs/day: 0.5 packs/day for 10.0 years (5.0 ttl pk-yrs)    Types: Cigarettes    Start date: 04/27/1962    Quit date: 04/27/1972    Years since quitting: 51.7   Smokeless tobacco: Never  Vaping Use   Vaping status: Never Used  Substance and Sexual Activity   Alcohol use: Yes    Alcohol/week: 0.0 standard drinks of alcohol    Comment: occ 1 glass wine or beer   Drug use: No   Sexual activity: Not on file  Other Topics Concern   Not on file  Social History Narrative   Lives at home with husband   Caffeine use- tea, 1 cup/day   Social Drivers of Corporate investment banker Strain: Low Risk  (06/20/2023)   Overall Financial Resource Strain (CARDIA)    Difficulty of Paying Living Expenses: Not hard at all  Food Insecurity: No Food Insecurity (06/20/2023)   Hunger Vital Sign    Worried About Running Out of Food in the Last Year: Never true    Ran Out of Food in the Last Year: Never true  Transportation Needs: No Transportation Needs (06/20/2023)   PRAPARE - Administrator, Civil Service (Medical): No    Lack of Transportation (Non-Medical): No  Physical Activity: Insufficiently Active (06/20/2023)   Exercise Vital Sign    Days of Exercise per Week: 4 days    Minutes of Exercise per Session: 30 min  Stress: No Stress Concern Present (06/20/2023)   [employer] of Occupational Health - Occupational Stress Questionnaire    Feeling of Stress : Not at all  Social Connections: Socially Integrated (06/20/2023)   Social Connection and Isolation Panel     Frequency of Communication with Friends and Family: More than three times a week    Frequency of Social Gatherings with Friends and Family: More than three times a week    Attends Religious Services: More than 4 times per year    Active Member of [employer] or Organizations: Yes    Attends Banker Meetings: More than 4 times per year    Marital Status: Married  Catering manager Violence: Not At Risk (06/20/2023)   Humiliation, Afraid, Rape, and Kick questionnaire  Fear of Current or Ex-Partner: No    Emotionally Abused: No    Physically Abused: No    Sexually Abused: No    Outpatient Medications Prior to Visit  Medication Sig Dispense Refill   Ascorbic Acid (VITAMIN C) 100 MG tablet Take 100 mg by mouth daily.     Calcium-Magnesium 200-100 MG TABS Take 4 tablets by mouth daily. 2 tablets in the morning and 2 tablets in the evening     diazepam  (VALIUM ) 5 MG tablet Take 1/4-1/2 tablet po QHS 45 tablet 2   ergocalciferol  (DRISDOL) 200 MCG/ML drops      escitalopram  (LEXAPRO ) 10 MG tablet Take 1 tablet (10 mg total) by mouth daily. 90 tablet 0   hydrocortisone  (ANUSOL -HC) 2.5 % rectal cream Place 1 Application rectally 2 (two) times daily. 30 g 1   Multiple Vitamin (MULTIVITAMIN) capsule Take 1 capsule by mouth daily.     Omega-3 1000 MG CAPS Take by mouth.     No facility-administered medications prior to visit.    Allergies  Allergen Reactions   Aricept  [Donepezil  Hcl] Other (See Comments)    Nightmares, worsened mental health   Doxycycline Swelling   Tetanus Toxoid-Containing Vaccines Other (See Comments)    PAIN IN NECK AND FACE   Flexeril [Cyclobenzaprine] Other (See Comments)    Review of Systems  Constitutional:  Negative for fever and malaise/fatigue.  HENT:  Negative for congestion.   Eyes:  Negative for blurred vision.  Respiratory:  Negative for shortness of breath.   Cardiovascular:  Negative for chest pain, palpitations and leg swelling.   Gastrointestinal:  Negative for abdominal pain, blood in stool and nausea.  Genitourinary:  Negative for dysuria and frequency.  Musculoskeletal:  Positive for back pain, joint pain and myalgias. Negative for falls.  Skin:  Negative for rash.  Neurological:  Positive for tingling. Negative for dizziness, loss of consciousness and headaches.  Endo/Heme/Allergies:  Negative for environmental allergies. Bruises/bleeds easily.  Psychiatric/Behavioral:  Positive for memory loss. Negative for depression. The patient is nervous/anxious and has insomnia.        Objective:    Physical Exam Constitutional:      General: She is not in acute distress.    Appearance: Normal appearance. She is well-developed. She is not toxic-appearing.  HENT:     Head: Normocephalic and atraumatic.     Right Ear: External ear normal.     Left Ear: External ear normal.     Nose: Nose normal.  Eyes:     General:        Right eye: No discharge.        Left eye: No discharge.     Conjunctiva/sclera: Conjunctivae normal.  Neck:     Thyroid : No thyromegaly.  Cardiovascular:     Rate and Rhythm: Normal rate and regular rhythm.     Heart sounds: Normal heart sounds. No murmur heard. Pulmonary:     Effort: Pulmonary effort is normal. No respiratory distress.     Breath sounds: Normal breath sounds.  Abdominal:     General: Bowel sounds are normal.     Palpations: Abdomen is soft.     Tenderness: There is no abdominal tenderness. There is no guarding.  Musculoskeletal:        General: Normal range of motion.     Cervical back: Neck supple.     Comments: Scattered bruising arms and legs  Lymphadenopathy:     Cervical: No cervical adenopathy.  Skin:  General: Skin is warm and dry.  Neurological:     Mental Status: She is alert and oriented to person, place, and time.  Psychiatric:        Mood and Affect: Mood normal.        Behavior: Behavior normal.        Thought Content: Thought content normal.         Judgment: Judgment normal.     BP 118/70   Pulse 70   Temp 97.6 F (36.4 C)   Resp 16   Ht 5' 5 (1.651 m)   Wt 126 lb 6.4 oz (57.3 kg)   SpO2 98%   BMI 21.03 kg/m  Wt Readings from Last 3 Encounters:  01/04/24 126 lb 6.4 oz (57.3 kg)  12/13/23 124 lb 6.4 oz (56.4 kg)  09/28/23 125 lb 9.6 oz (57 kg)    Diabetic Foot Exam - Simple   No data filed    Lab Results  Component Value Date   WBC 4.2 01/04/2024   HGB 12.6 01/04/2024   HCT 37.2 01/04/2024   PLT 260.0 01/04/2024   GLUCOSE 89 01/04/2024   CHOL 219 (H) 01/04/2024   TRIG 184.0 (H) 01/04/2024   HDL 56.60 01/04/2024   LDLCALC 126 (H) 01/04/2024   ALT 14 01/04/2024   AST 21 01/04/2024   NA 140 01/04/2024   K 4.4 01/04/2024   CL 108 01/04/2024   CREATININE 0.90 01/04/2024   BUN 15 01/04/2024   CO2 24 01/04/2024   TSH 1.69 01/04/2024   INR 0.9 11/09/2023    Lab Results  Component Value Date   TSH 1.69 01/04/2024   Lab Results  Component Value Date   WBC 4.2 01/04/2024   HGB 12.6 01/04/2024   HCT 37.2 01/04/2024   MCV 93.2 01/04/2024   PLT 260.0 01/04/2024   Lab Results  Component Value Date   NA 140 01/04/2024   K 4.4 01/04/2024   CO2 24 01/04/2024   GLUCOSE 89 01/04/2024   BUN 15 01/04/2024   CREATININE 0.90 01/04/2024   BILITOT 0.5 01/04/2024   ALKPHOS 55 01/04/2024   AST 21 01/04/2024   ALT 14 01/04/2024   PROT 6.4 01/04/2024   ALBUMIN 4.4 01/04/2024   CALCIUM 9.5 01/04/2024   ANIONGAP 14 11/09/2023   GFR 61.85 01/04/2024   Lab Results  Component Value Date   CHOL 219 (H) 01/04/2024   Lab Results  Component Value Date   HDL 56.60 01/04/2024   Lab Results  Component Value Date   LDLCALC 126 (H) 01/04/2024   Lab Results  Component Value Date   TRIG 184.0 (H) 01/04/2024   Lab Results  Component Value Date   CHOLHDL 4 01/04/2024   No results found for: HGBA1C     Assessment & Plan:  Mixed hyperlipidemia Assessment & Plan: Encourage heart healthy diet such as MIND  or DASH diet, increase exercise, avoid trans fats, simple carbohydrates and processed foods, consider a krill or fish or flaxseed oil cap daily.    Orders: -     Comprehensive metabolic panel with GFR -     Lipid panel -     TSH -     CBC with Differential/Platelet  Mood disorder Assessment & Plan: Stable but continues to struggle some   Neurocognitive disorder Assessment & Plan: Is frustrated by her progressive memory loss   Vitamin D  deficiency -     VITAMIN D  25 Hydroxy (Vit-D Deficiency, Fractures)  Need for influenza vaccination -  Flu vaccine HIGH DOSE PF(Fluzone Trivalent)  Other orders -     tiZANidine  HCl; Take 0.5-2 tablets (1-4 mg total) by mouth every 8 (eight) hours as needed for muscle spasms.  Dispense: 30 tablet; Refill: 1    Assessment and Plan Assessment & Plan Low back pain and chronic right-sided pain post-fall Low back pain started a few days ago, exacerbated by a previous fall on the right side. Pain is persistent and worsened by physical therapy. Pain is managed with Tylenol and Advil , but relief is minimal. Considering muscle relaxant tizanidine  for additional relief. If no improvement, referral to sports medicine may be considered. - Prescribe tizanidine  2 mg, up to 3 times a day, with the option to split tablets for lower doses. - Continue Tylenol and Advil  as needed, with a maximum of 3,000 mg of Tylenol per day and up to 4 doses of Advil  per day. - Advise rest and avoidance of overexertion. - Consider referral to sports medicine if no improvement in 1-2 weeks.  Easy bruising and skin fragility Bruising occurs easily, possibly due to age-related skin changes and Von Willebrand disease. Bruises appear spontaneously and are not associated with trauma. Discussed the natural thinning of skin with age and the importance of protein intake. - Check platelet levels and other labs to assess for underlying causes of bruising. - Encourage adequate protein  intake and hydration.  Cognitive impairment Memory issues noted, possibly related to previous falls and age. Discussed the impact of past trauma and stress on cognitive function.  Insomnia Difficulty sleeping during the day and night. Discussed the use of Cognitive Behavioral Therapy for Insomnia (CBTI) app as a non-pharmacological approach to improve sleep quality. - Recommend using the CBTI app from the [facility] for sleep improvement. - Encourage rest and relaxation techniques.  Recording duration: 41 minutes     Harlene Horton, MD

## 2024-01-08 NOTE — Progress Notes (Signed)
 Reviewed via MyChart

## 2024-01-23 ENCOUNTER — Ambulatory Visit: Admitting: Physician Assistant

## 2024-05-06 ENCOUNTER — Telehealth: Payer: Self-pay | Admitting: Neurology

## 2024-05-06 ENCOUNTER — Other Ambulatory Visit: Payer: Self-pay | Admitting: Physician Assistant

## 2024-05-06 ENCOUNTER — Telehealth: Payer: Self-pay | Admitting: Physician Assistant

## 2024-05-06 DIAGNOSIS — F4310 Post-traumatic stress disorder, unspecified: Secondary | ICD-10-CM

## 2024-05-06 DIAGNOSIS — F32A Depression, unspecified: Secondary | ICD-10-CM

## 2024-05-06 NOTE — Telephone Encounter (Signed)
 Copied from CRM #[id number]. Topic: Appointments - Transfer of Care >> May 06, 2024  1:09 PM [name] ORN wrote: Pt is requesting to transfer FROM: Dawn Simmons Pt is requesting to transfer TO: Dawn Simmons Reason for requested transfer: pcp leaving practice

## 2024-05-06 NOTE — Telephone Encounter (Signed)
 LVM to [facility]

## 2024-05-06 NOTE — Telephone Encounter (Signed)
 Patient's husband lvm at 1:18 requesting refill Escitalopram  10mg . Ph: [phone] [facility] [address] [place]

## 2024-05-06 NOTE — Telephone Encounter (Signed)
 Pt is past due for FU with [facility]. Escitalopram  was last prescribed by Glade Horton.

## 2024-05-07 MED ORDER — ESCITALOPRAM OXALATE 10 MG PO TABS: 10.0000 mg | ORAL_TABLET | Freq: Every day | ORAL | 0 refills | Status: AC

## 2024-05-07 NOTE — Telephone Encounter (Signed)
 Patient saw Dawn Simmons and saw Dawn Simmons once. She didn't feel that was a good fit. She is asking if she can see someone else within the practice. Please call and talk with husband and let him know either way.

## 2024-05-07 NOTE — Telephone Encounter (Signed)
 Pt has an appt 05/22/24

## 2024-05-07 NOTE — Telephone Encounter (Signed)
 Sent in 30-day supply of Lexapro . Will need appt for further refills.

## 2024-05-16 ENCOUNTER — Ambulatory Visit: Admitting: Family Medicine

## 2024-05-22 ENCOUNTER — Ambulatory Visit: Admitting: Physician Assistant

## 2024-06-05 ENCOUNTER — Encounter: Admitting: Physician Assistant

## 2024-06-25 ENCOUNTER — Ambulatory Visit
